# Patient Record
Sex: Female | Born: 1965 | Race: Black or African American | Hispanic: No | Marital: Married | Smoking: Never smoker
Health system: Southern US, Community
[De-identification: ages and names within clinical notes are randomized; demographics above are authoritative.]

## PROBLEM LIST (undated history)

## (undated) DIAGNOSIS — T782XXA Anaphylactic shock, unspecified, initial encounter: Secondary | ICD-10-CM

## (undated) DIAGNOSIS — E785 Hyperlipidemia, unspecified: Secondary | ICD-10-CM

## (undated) DIAGNOSIS — G609 Hereditary and idiopathic neuropathy, unspecified: Secondary | ICD-10-CM

## (undated) DIAGNOSIS — H409 Unspecified glaucoma: Secondary | ICD-10-CM

## (undated) DIAGNOSIS — G43909 Migraine, unspecified, not intractable, without status migrainosus: Secondary | ICD-10-CM

## (undated) DIAGNOSIS — J45909 Unspecified asthma, uncomplicated: Secondary | ICD-10-CM

## (undated) DIAGNOSIS — D649 Anemia, unspecified: Secondary | ICD-10-CM

## (undated) DIAGNOSIS — K219 Gastro-esophageal reflux disease without esophagitis: Secondary | ICD-10-CM

## (undated) DIAGNOSIS — R569 Unspecified convulsions: Secondary | ICD-10-CM

## (undated) DIAGNOSIS — L509 Urticaria, unspecified: Secondary | ICD-10-CM

## (undated) HISTORY — DX: Anaphylactic shock, unspecified, initial encounter: T78.2XXA

## (undated) HISTORY — DX: Gastro-esophageal reflux disease without esophagitis: K21.9

## (undated) HISTORY — DX: Anemia, unspecified: D64.9

## (undated) HISTORY — DX: Hereditary and idiopathic neuropathy, unspecified: G60.9

## (undated) HISTORY — DX: Migraine, unspecified, not intractable, without status migrainosus: G43.909

## (undated) HISTORY — DX: Hyperlipidemia, unspecified: E78.5

## (undated) HISTORY — DX: Unspecified glaucoma: H40.9

## (undated) HISTORY — DX: Urticaria, unspecified: L50.9

---

## 1986-05-30 DIAGNOSIS — Z5189 Encounter for other specified aftercare: Secondary | ICD-10-CM

## 1986-05-30 HISTORY — DX: Encounter for other specified aftercare: Z51.89

## 1990-05-30 HISTORY — PX: APPENDECTOMY: SHX54

## 1994-05-30 HISTORY — PX: ABDOMINAL HYSTERECTOMY: SHX81

## 1995-05-31 HISTORY — PX: CYST REMOVAL HAND: SHX6279

## 1997-12-19 ENCOUNTER — Inpatient Hospital Stay (HOSPITAL_COMMUNITY): Admission: EM | Admit: 1997-12-19 | Discharge: 1997-12-20 | Payer: Self-pay | Admitting: Obstetrics & Gynecology

## 1998-07-20 ENCOUNTER — Emergency Department (HOSPITAL_COMMUNITY): Admission: EM | Admit: 1998-07-20 | Discharge: 1998-07-20 | Payer: Self-pay | Admitting: Emergency Medicine

## 1999-01-26 ENCOUNTER — Encounter: Payer: Self-pay | Admitting: Gastroenterology

## 1999-01-26 ENCOUNTER — Ambulatory Visit (HOSPITAL_COMMUNITY): Admission: RE | Admit: 1999-01-26 | Discharge: 1999-01-26 | Payer: Self-pay | Admitting: Gastroenterology

## 1999-02-25 ENCOUNTER — Encounter: Payer: Self-pay | Admitting: Gastroenterology

## 1999-02-25 ENCOUNTER — Ambulatory Visit (HOSPITAL_COMMUNITY): Admission: RE | Admit: 1999-02-25 | Discharge: 1999-02-25 | Payer: Self-pay | Admitting: Gastroenterology

## 2000-06-20 ENCOUNTER — Emergency Department (HOSPITAL_COMMUNITY): Admission: EM | Admit: 2000-06-20 | Discharge: 2000-06-21 | Payer: Self-pay | Admitting: Emergency Medicine

## 2004-08-16 ENCOUNTER — Ambulatory Visit: Payer: Self-pay | Admitting: Internal Medicine

## 2004-08-18 ENCOUNTER — Ambulatory Visit: Payer: Self-pay | Admitting: Internal Medicine

## 2006-07-27 ENCOUNTER — Emergency Department (HOSPITAL_COMMUNITY): Admission: EM | Admit: 2006-07-27 | Discharge: 2006-07-27 | Payer: Self-pay | Admitting: Emergency Medicine

## 2008-08-21 ENCOUNTER — Ambulatory Visit: Payer: Self-pay | Admitting: Internal Medicine

## 2008-08-21 LAB — CONVERTED CEMR LAB
ALT: 16 units/L (ref 0–35)
AST: 17 units/L (ref 0–37)
Albumin: 3.9 g/dL (ref 3.5–5.2)
Alkaline Phosphatase: 76 units/L (ref 39–117)
BUN: 9 mg/dL (ref 6–23)
Basophils Absolute: 0 10*3/uL (ref 0.0–0.1)
Basophils Relative: 0.5 % (ref 0.0–3.0)
Bilirubin Urine: NEGATIVE
Bilirubin, Direct: 0.1 mg/dL (ref 0.0–0.3)
CO2: 28 meq/L (ref 19–32)
Calcium: 9.3 mg/dL (ref 8.4–10.5)
Chloride: 106 meq/L (ref 96–112)
Cholesterol: 172 mg/dL (ref 0–200)
Creatinine, Ser: 0.8 mg/dL (ref 0.4–1.2)
Eosinophils Absolute: 0.1 10*3/uL (ref 0.0–0.7)
Eosinophils Relative: 2.7 % (ref 0.0–5.0)
GFR calc non Af Amer: 101.28 mL/min (ref >60)
Glucose, Bld: 114 mg/dL — ABNORMAL HIGH (ref 70–99)
HCT: 39.4 % (ref 36.0–46.0)
HDL: 28.4 mg/dL — ABNORMAL LOW (ref >39.00)
Hemoglobin: 13.7 g/dL (ref 12.0–15.0)
Hgb A1c MFr Bld: 6 % (ref 4.6–6.5)
Ketones, ur: NEGATIVE mg/dL
LDL Cholesterol: 122 mg/dL — ABNORMAL HIGH (ref 0–99)
Lymphocytes Relative: 43.8 % (ref 12.0–46.0)
Lymphs Abs: 2.1 10*3/uL (ref 0.7–4.0)
MCHC: 34.8 g/dL (ref 30.0–36.0)
MCV: 90.1 fL (ref 78.0–100.0)
Monocytes Absolute: 0.3 10*3/uL (ref 0.1–1.0)
Monocytes Relative: 6.8 % (ref 3.0–12.0)
Neutro Abs: 2.2 10*3/uL (ref 1.4–7.7)
Neutrophils Relative %: 46.2 % (ref 43.0–77.0)
Nitrite: NEGATIVE
Platelets: 188 10*3/uL (ref 150.0–400.0)
Potassium: 3.6 meq/L (ref 3.5–5.1)
RBC: 4.37 M/uL (ref 3.87–5.11)
RDW: 12.3 % (ref 11.5–14.6)
Sodium: 141 meq/L (ref 135–145)
Specific Gravity, Urine: 1.02 (ref 1.000–1.030)
TSH: 0.96 microintl units/mL (ref 0.35–5.50)
Total Bilirubin: 0.6 mg/dL (ref 0.3–1.2)
Total CHOL/HDL Ratio: 6
Total Protein, Urine: NEGATIVE mg/dL
Total Protein: 7.4 g/dL (ref 6.0–8.3)
Triglycerides: 108 mg/dL (ref 0.0–149.0)
Urine Glucose: NEGATIVE mg/dL
Urobilinogen, UA: 0.2 (ref 0.0–1.0)
VLDL: 21.6 mg/dL (ref 0.0–40.0)
WBC: 4.7 10*3/uL (ref 4.5–10.5)
pH: 6 (ref 5.0–8.0)

## 2008-09-03 ENCOUNTER — Ambulatory Visit: Payer: Self-pay | Admitting: Internal Medicine

## 2008-09-03 DIAGNOSIS — D649 Anemia, unspecified: Secondary | ICD-10-CM

## 2008-09-03 DIAGNOSIS — R7611 Nonspecific reaction to tuberculin skin test without active tuberculosis: Secondary | ICD-10-CM | POA: Insufficient documentation

## 2008-09-03 DIAGNOSIS — K219 Gastro-esophageal reflux disease without esophagitis: Secondary | ICD-10-CM | POA: Insufficient documentation

## 2008-09-03 DIAGNOSIS — G43909 Migraine, unspecified, not intractable, without status migrainosus: Secondary | ICD-10-CM | POA: Insufficient documentation

## 2008-09-03 DIAGNOSIS — E785 Hyperlipidemia, unspecified: Secondary | ICD-10-CM | POA: Insufficient documentation

## 2008-12-29 ENCOUNTER — Encounter: Payer: Self-pay | Admitting: Internal Medicine

## 2009-01-15 ENCOUNTER — Encounter (INDEPENDENT_AMBULATORY_CARE_PROVIDER_SITE_OTHER): Payer: Self-pay | Admitting: *Deleted

## 2009-04-14 ENCOUNTER — Ambulatory Visit (HOSPITAL_COMMUNITY): Admission: RE | Admit: 2009-04-14 | Discharge: 2009-04-14 | Payer: Self-pay | Admitting: Internal Medicine

## 2009-04-22 DIAGNOSIS — R928 Other abnormal and inconclusive findings on diagnostic imaging of breast: Secondary | ICD-10-CM | POA: Insufficient documentation

## 2009-04-28 ENCOUNTER — Encounter: Payer: Self-pay | Admitting: Internal Medicine

## 2009-04-29 ENCOUNTER — Encounter: Admission: RE | Admit: 2009-04-29 | Discharge: 2009-04-29 | Payer: Self-pay | Admitting: Internal Medicine

## 2009-09-10 ENCOUNTER — Other Ambulatory Visit: Admission: RE | Admit: 2009-09-10 | Discharge: 2009-09-10 | Payer: Self-pay | Admitting: Internal Medicine

## 2009-09-10 ENCOUNTER — Ambulatory Visit: Payer: Self-pay | Admitting: Internal Medicine

## 2009-09-10 LAB — HM PAP SMEAR: HM Pap smear: NORMAL

## 2009-09-11 LAB — CONVERTED CEMR LAB
ALT: 16 units/L (ref 0–35)
AST: 20 units/L (ref 0–37)
Albumin: 3.9 g/dL (ref 3.5–5.2)
Alkaline Phosphatase: 78 units/L (ref 39–117)
BUN: 8 mg/dL (ref 6–23)
Basophils Absolute: 0 10*3/uL (ref 0.0–0.1)
Basophils Relative: 0.9 % (ref 0.0–3.0)
Bilirubin Urine: NEGATIVE
Bilirubin, Direct: 0 mg/dL (ref 0.0–0.3)
CO2: 28 meq/L (ref 19–32)
Calcium: 9.5 mg/dL (ref 8.4–10.5)
Chloride: 106 meq/L (ref 96–112)
Cholesterol: 185 mg/dL (ref 0–200)
Creatinine, Ser: 0.7 mg/dL (ref 0.4–1.2)
Eosinophils Absolute: 0.2 10*3/uL (ref 0.0–0.7)
Eosinophils Relative: 3.9 % (ref 0.0–5.0)
GFR calc non Af Amer: 117.56 mL/min (ref >60)
Glucose, Bld: 103 mg/dL — ABNORMAL HIGH (ref 70–99)
HCT: 40.3 % (ref 36.0–46.0)
HDL: 36.4 mg/dL — ABNORMAL LOW (ref >39.00)
Hemoglobin: 14.1 g/dL (ref 12.0–15.0)
Ketones, ur: NEGATIVE mg/dL
LDL Cholesterol: 128 mg/dL — ABNORMAL HIGH (ref 0–99)
Lymphocytes Relative: 42.8 % (ref 12.0–46.0)
Lymphs Abs: 1.8 10*3/uL (ref 0.7–4.0)
MCHC: 35 g/dL (ref 30.0–36.0)
MCV: 88.4 fL (ref 78.0–100.0)
Monocytes Absolute: 0.3 10*3/uL (ref 0.1–1.0)
Monocytes Relative: 6.7 % (ref 3.0–12.0)
Neutro Abs: 1.9 10*3/uL (ref 1.4–7.7)
Neutrophils Relative %: 45.7 % (ref 43.0–77.0)
Nitrite: NEGATIVE
Platelets: 200 10*3/uL (ref 150.0–400.0)
Potassium: 3.8 meq/L (ref 3.5–5.1)
RBC: 4.56 M/uL (ref 3.87–5.11)
RDW: 13.1 % (ref 11.5–14.6)
Sodium: 141 meq/L (ref 135–145)
Specific Gravity, Urine: 1.025 (ref 1.000–1.030)
TSH: 0.94 microintl units/mL (ref 0.35–5.50)
Total Bilirubin: 0.4 mg/dL (ref 0.3–1.2)
Total CHOL/HDL Ratio: 5
Total Protein, Urine: NEGATIVE mg/dL
Total Protein: 8.1 g/dL (ref 6.0–8.3)
Triglycerides: 101 mg/dL (ref 0.0–149.0)
Urine Glucose: NEGATIVE mg/dL
Urobilinogen, UA: 0.2 (ref 0.0–1.0)
VLDL: 20.2 mg/dL (ref 0.0–40.0)
WBC: 4.2 10*3/uL — ABNORMAL LOW (ref 4.5–10.5)
pH: 6 (ref 5.0–8.0)

## 2009-09-14 LAB — CONVERTED CEMR LAB: Pap Smear: NEGATIVE

## 2010-01-14 ENCOUNTER — Ambulatory Visit: Payer: Self-pay | Admitting: Internal Medicine

## 2010-01-14 DIAGNOSIS — G609 Hereditary and idiopathic neuropathy, unspecified: Secondary | ICD-10-CM | POA: Insufficient documentation

## 2010-01-15 LAB — CONVERTED CEMR LAB
Folate: 6.3 ng/mL
Glucose, Bld: 105 mg/dL — ABNORMAL HIGH (ref 70–99)

## 2010-02-02 ENCOUNTER — Encounter: Payer: Self-pay | Admitting: Internal Medicine

## 2010-02-11 ENCOUNTER — Telehealth: Payer: Self-pay | Admitting: Internal Medicine

## 2010-05-30 DIAGNOSIS — T7840XA Allergy, unspecified, initial encounter: Secondary | ICD-10-CM

## 2010-05-30 HISTORY — DX: Allergy, unspecified, initial encounter: T78.40XA

## 2010-06-20 ENCOUNTER — Encounter: Payer: Self-pay | Admitting: Internal Medicine

## 2010-06-29 NOTE — Assessment & Plan Note (Signed)
Summary: FU---STC   Vital Signs:  Patient profile:   45 year old female Height:      5.6 inches (14.22 cm) Weight:      230.0 pounds (104.55 kg) BMI:     5175.13 O2 Sat:      96 % on Room air Temp:     99.4 degrees F (37.44 degrees C) oral Pulse rate:   74 / minute BP sitting:   118 / 84  (left arm) Cuff size:   large  Vitals Entered By: Orlan Leavens (September 10, 2009 8:51 AM)  O2 Flow:  Room air CC: follow-up visit Is Patient Diabetic? No Pain Assessment Patient in pain? no        Primary Care Provider:  Newt Lukes MD  CC:  follow-up visit.  History of Present Illness: patient is here today for annual physical. Patient feels well-  occ reflux and stomach bloat - uses otc gas x with incomplete relief of symptoms and indigestion -   Preventive Screening-Counseling & Management  Alcohol-Tobacco     Alcohol drinks/day: 0     Smoking Status: never  Caffeine-Diet-Exercise     Does Patient Exercise: yes     Exercise Counseling: to improve exercise regimen     Depression Counseling: not indicated; screening negative for depression  Safety-Violence-Falls     Seat Belt Use: yes     Helmet Use: no     Firearms in the Home: no firearms in the home     Smoke Detectors: yes     Violence in the Home: no risk noted     Fall Risk Counseling: not indicated; no significant falls noted  Clinical Review Panels:  Prevention   Last Mammogram:  BI-RADS CATEGORY 2:  Benign finding(s).^MM DIGITAL DIAG LTD L (04/29/2009)  Immunizations   Last Tetanus Booster:  Historical (05/30/2006)  Lipid Management   Cholesterol:  172 (08/21/2008)   LDL (bad choesterol):  122 (08/21/2008)   HDL (good cholesterol):  28.40 (08/21/2008)  Diabetes Management   HgBA1C:  6.0 (08/21/2008)   Creatinine:  0.8 (08/21/2008)  CBC   WBC:  4.7 (08/21/2008)   RBC:  4.37 (08/21/2008)   Hgb:  13.7 (08/21/2008)   Hct:  39.4 (08/21/2008)   Platelets:  188.0 (08/21/2008)   MCV  90.1  (08/21/2008)   MCHC  34.8 (08/21/2008)   RDW  12.3 (08/21/2008)   PMN:  46.2 (08/21/2008)   Lymphs:  43.8 (08/21/2008)   Monos:  6.8 (08/21/2008)   Eosinophils:  2.7 (08/21/2008)   Basophil:  0.5 (08/21/2008)  Complete Metabolic Panel   Glucose:  114 (08/21/2008)   Sodium:  141 (08/21/2008)   Potassium:  3.6 (08/21/2008)   Chloride:  106 (08/21/2008)   CO2:  28 (08/21/2008)   BUN:  9 (08/21/2008)   Creatinine:  0.8 (08/21/2008)   Albumin:  3.9 (08/21/2008)   Total Protein:  7.4 (08/21/2008)   Calcium:  9.3 (08/21/2008)   Total Bili:  0.6 (08/21/2008)   Alk Phos:  76 (08/21/2008)   SGPT (ALT):  16 (08/21/2008)   SGOT (AST):  17 (08/21/2008)   Current Medications (verified): 1)  None  Allergies (verified): 1)  ! Morphine  Past History:  Past Medical History: GERD (ICD-530.81) ANEMIA-NOS (ICD-285.9) POSITIVE PPD (ICD-795.5) MIGRAINE HEADACHE (ICD-346.90)  MD rooster: none  Past Surgical History: Reviewed history from 09/03/2008 and no changes required. Appendectomy (1914) Hysterectomy (1996) Caesarean section (1989)  Family History: Reviewed history from 09/03/2008 and no changes required. 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 I have reviewed all other systems and they were negative.   Physical Exam  General:  alert, well-developed, well-nourished, and cooperative to examination.    Eyes:   vision grossly intact; pupils equal, round and reactive to light.  conjunctiva and lids normal.    Ears:  normal pinnae bilaterally, without erythema, swelling, or tenderness to palpation. TMs clear, without effusion, or cerumen impaction. Hearing grossly normal bilaterally  Mouth:  teeth and gums in good repair; mucous membranes moist, without lesions or ulcers. oropharynx clear without exudate, no erythema.  Lungs:  normal respiratory effort, no intercostal retractions or use of accessory muscles; normal breath sounds bilaterally - no crackles and no wheezes.    Heart:  normal rate, regular rhythm, no murmur, and no rub. BLE without edema. normal DP pulses and normal cap refill in all 4 extremities    Abdomen:  soft, non-tender, normal bowel sounds, no distention; no masses and no appreciable hepatomegaly or splenomegaly.   Genitalia:  Pelvic Exam:        External: normal female genitalia without lesions or masses        Vagina: normal without lesions or masses        Adnexa: normal bimanual exam without masses or fullness        Pap smear: performed Msk:  No deformity or scoliosis noted of thoracic or lumbar spine.   Neurologic:  alert & oriented X3 and cranial nerves II-XII symetrically intact.  strength normal in all extremities, sensation intact to light touch, and gait normal. speech fluent without dysarthria or aphasia; follows commands with good comprehension.  Skin:  no rashes, vesicles, ulcers, or erythema. No nodules or irregularity to palpation.  Psych:  Oriented X3, memory intact for recent and remote, normally interactive, good eye contact, not anxious appearing, not depressed appearing, and not agitated.      Impression & Recommendations:  Problem # 1:  PREVENTIVE HEALTH CARE (ICD-V70.0)  Patient has been counseled on age-appropriate routine health concerns for screening and prevention. These are reviewed and up-to-date. Immunizations are up-to-date or declined. PAP/pelvic done,  labs ordered and will be reviewed; and ECG reviewed.   Orders: TLB-Lipid Panel (80061-LIPID) TLB-BMP (Basic Metabolic Panel-BMET) (80048-METABOL) TLB-CBC Platelet - w/Differential (85025-CBCD) TLB-Hepatic/Liver Function Pnl (80076-HEPATIC) TLB-TSH (Thyroid Stimulating Hormone) (84443-TSH) TLB-Udip ONLY (81003-UDIP) EKG w/ Interpretation (93000) Pap Smear, Thin Prep ( Collection of) (Z6109)  Problem # 2:  GERD (ICD-530.81)  Her updated medication list for this problem includes:    Prilosec Otc 20 Mg Tbec (Omeprazole magnesium) .Marland Kitchen... 1 by mouth once daily x 2 week, then as needed for indigestion  Labs Reviewed: Hgb: 13.7 (08/21/2008)   Hct: 39.4 (08/21/2008)  Complete Medication List: 1)  Prilosec Otc 20 Mg Tbec (Omeprazole magnesium) .Marland Kitchen.. 1 by mouth once daily x 2 week, then as needed for indigestion 2)  Gas-x 80 Mg Chew (Simethicone) .Marland Kitchen.. 1-2  by mouth every 8hours as needed for gas and bloating  Patient Instructions: 1)  it was good to see you today. 2)  exam and EKG (heart) lok good today -  3)  test(s) ordered today - your results will be posted on the phone tree for review in 48-72 hours from the time of test completion; call [phone] and enter your 9 digit MRN (listed above on this page, just below your name); if any changes need to be made or there are abnormal results, you will be contacted directly.  4)  Prilosec OTC as discussed + Gas X as needed - call if symptoms unimproved with this therapy - 5)  it is important that you work on losing weight - monitor your diet and consume fewer calories such as less carbohydrates (sugar) and less fat. you also need to increase your physical activity level - start by walking for 10-20 minutes 3 times per week and work up to 30 minutes 4-5 times each week.  6)  Take calcium +Vitamin D daily. goal= 1200mg  calcium and 1000u vit d daily 7)  Please schedule a follow-up appointment annually for medical physical, sooner if problems.  Will not  need to repeat PAP if this is normal this year!

## 2010-06-29 NOTE — Assessment & Plan Note (Signed)
Summary: numbness hands,feet/cd   Vital Signs:  Patient profile:   45 year old female Height:      66 inches (14.22 cm) Weight:      234.50 pounds (106.59 kg) BMI:     37.99 O2 Sat:      98 % on Room air Temp:     98.5 degrees F (36.94 degrees C) oral Pulse rate:   89 / minute BP sitting:   124 / 86  (left arm) Cuff size:   large  Vitals Entered By: Brenton Grills MA (January 14, 2010 9:00 AM)  O2 Flow:  Room air CC: numbness in fingertips and toes/hand swelling/pt is not taking any medications/aj   Primary Care Provider:  Newt Lukes MD  CC:  numbness in fingertips and toes/hand swelling/pt is not taking any medications/aj.  History of Present Illness: c/o hand numbness onset 2 weeks ago - affects right>left hand - sometimes "numb" sometimes "pins and needles pain" symptoms are intermittenet - symptoms worse awaking from sleep or with activity no injury to head, neck, shoulders, arms or hands - no falls or balance problems   Clinical Review Panels:  CBC   WBC:  4.2 (09/10/2009)   RBC:  4.56 (09/10/2009)   Hgb:  14.1 (09/10/2009)   Hct:  40.3 (09/10/2009)   Platelets:  200.0 (09/10/2009)   MCV  88.4 (09/10/2009)   MCHC  35.0 (09/10/2009)   RDW  13.1 (09/10/2009)   PMN:  45.7 (09/10/2009)   Lymphs:  42.8 (09/10/2009)   Monos:  6.7 (09/10/2009)   Eosinophils:  3.9 (09/10/2009)   Basophil:  0.9 (09/10/2009)  Complete Metabolic Panel   Glucose:  103 (09/10/2009)   Sodium:  141 (09/10/2009)   Potassium:  3.8 (09/10/2009)   Chloride:  106 (09/10/2009)   CO2:  28 (09/10/2009)   BUN:  8 (09/10/2009)   Creatinine:  0.7 (09/10/2009)   Albumin:  3.9 (09/10/2009)   Total Protein:  8.1 (09/10/2009)   Calcium:  9.5 (09/10/2009)   Total Bili:  0.4 (09/10/2009)   Alk Phos:  78 (09/10/2009)   SGPT (ALT):  16 (09/10/2009)   SGOT (AST):  20 (09/10/2009)   Current Medications (verified): 1)  Prilosec Otc 20 Mg Tbec (Omeprazole Magnesium) .Marland Kitchen.. 1 By Mouth Once  Daily X 2 Week, Then As Needed For Indigestion 2)  Gas-X 80 Mg Chew (Simethicone) .Marland Kitchen.. 1-2 By Mouth Every 8hours As Needed For Gas and Bloating  Allergies (verified): 1)  ! Morphine  Past History:  Past Medical History: GERD (ICD-530.81) ANEMIA-NOS (ICD-285.9) POSITIVE PPD (ICD-795.5) MIGRAINE HEADACHE (ICD-346.90)  MD roster: none  Review of Systems  The patient denies weight loss, abdominal pain, difficulty walking, depression, and unusual weight change.    Physical Exam  General:  alert, well-developed, well-nourished, and cooperative to examination.    Lungs:  normal respiratory effort, no intercostal retractions or use of accessory muscles; normal breath sounds bilaterally - no crackles and no wheezes.    Heart:  normal rate, regular rhythm, no murmur, and no rub. BLE without edema. normal DP pulses and normal cap refill in all 4 extremities    Neurologic:  alert & oriented X3 and cranial nerves II-XII symetrically intact.  strength grosslu normal in all extremities but decreased hand grip R>L; sensation intact to light touch, and gait + proprioception normal. speech fluent without dysarthria or aphasia; follows commands with good comprehension.    Impression & Recommendations:  Problem # 1:  PERIPHERAL NEUROPATHY (ICD-356.9)  pt symptoms  most c/w carpal tunnel by hx -  r/o metobolic or nutritional causes with labs now-  trial wrist spint + B6 - refer for PNCS to confirm dx/etiology -  Orders: Ankle / Wrist Splint (A4570) Misc. Referral (Misc. Ref) TLB-B12 + Folate Pnl (82746_82607-B12/FOL) TLB-TSH (Thyroid Stimulating Hormone) (84443-TSH) TLB-Glucose, QUANT (82947-GLU)  Complete Medication List: 1)  Prilosec Otc 20 Mg Tbec (Omeprazole magnesium) .Marland Kitchen.. 1 by mouth once daily x 2 week, then as needed for indigestion 2)  Gas-x 80 Mg Chew (Simethicone) .Marland Kitchen.. 1-2 by mouth every 8hours as needed for gas and bloating 3)  Vitamin B-6 100 Mg Tabs (Pyridoxine hcl) .Marland Kitchen.. 1 by  mouth once daily  Patient Instructions: 1)  it was good to see you today. 2)  we'll make referral for nerve studys to look for carpal tunnel or other nerve problems causing the numbness. Our office will contact you regarding this appointment once made.  3)  also test(s) ordered today - your results will be posted on the phone tree for review in 48-72 hours from the time of test completion; call [phone] and enter your 9 digit MRN (listed above on this page, just below your name); if any changes need to be made or there are abnormal results, you will be contacted directly.  4)  use wrist splint as discussed, esp at bedtime  5)  also take vit B6 100mg  once daily to help with the numbness 6)  Please schedule a follow-up appointment as needed.

## 2010-06-29 NOTE — Progress Notes (Signed)
Summary: PNCS results - mild B carpal tunnel syndrome  Phone Note Outgoing Call   Call placed to: Patient Reason for Call: Discuss lab or test results Summary of Call: please call pt - let her know the nerve conduction study test done at [facility] 02/02/10 shows very mild carpal tunnel syndrome - this may be contributing to her hand pain/numbness - she should contine tx as outlined at our OV (B6 + wrist splint at night) and make ROV if continued pain or symptoms - thanks Newt Lukes MD  February 11, 2010 8:53 AM   Follow-up for Phone Call        Notified pt with results and md recommendations Follow-up by: Orlan Leavens RMA,  February 11, 2010 12:01 PM

## 2010-07-09 ENCOUNTER — Emergency Department (HOSPITAL_COMMUNITY)
Admission: EM | Admit: 2010-07-09 | Discharge: 2010-07-09 | Disposition: A | Discharge status: Home / Self Care | Payer: Managed Care, Other (non HMO) | Attending: Emergency Medicine | Admitting: Emergency Medicine

## 2010-07-09 ENCOUNTER — Emergency Department (HOSPITAL_COMMUNITY): Payer: Managed Care, Other (non HMO)

## 2010-07-09 DIAGNOSIS — R1011 Right upper quadrant pain: Secondary | ICD-10-CM | POA: Insufficient documentation

## 2010-07-09 DIAGNOSIS — K2970 Gastritis, unspecified, without bleeding: Secondary | ICD-10-CM | POA: Insufficient documentation

## 2010-07-09 DIAGNOSIS — R0789 Other chest pain: Secondary | ICD-10-CM | POA: Insufficient documentation

## 2010-07-09 DIAGNOSIS — K2990 Gastroduodenitis, unspecified, without bleeding: Secondary | ICD-10-CM | POA: Insufficient documentation

## 2010-07-09 DIAGNOSIS — R110 Nausea: Secondary | ICD-10-CM | POA: Insufficient documentation

## 2010-07-09 LAB — COMPREHENSIVE METABOLIC PANEL
ALT: 16 U/L (ref 0–35)
AST: 19 U/L (ref 0–37)
Alkaline Phosphatase: 82 U/L (ref 39–117)
CO2: 27 mEq/L (ref 19–32)
Chloride: 106 mEq/L (ref 96–112)
Creatinine, Ser: 0.79 mg/dL (ref 0.4–1.2)
GFR calc Af Amer: >60 mL/min (ref >60)
GFR calc non Af Amer: >60 mL/min (ref >60)
Potassium: 3.5 mEq/L (ref 3.5–5.1)
Total Bilirubin: 0.3 mg/dL (ref 0.3–1.2)

## 2010-07-09 LAB — DIFFERENTIAL
Basophils Relative: 1 % (ref 0–1)
Lymphs Abs: 2 10*3/uL (ref 0.7–4.0)
Monocytes Relative: 7 % (ref 3–12)
Neutro Abs: 2.8 10*3/uL (ref 1.7–7.7)
Neutrophils Relative %: 52 % (ref 43–77)

## 2010-07-09 LAB — CBC
Hemoglobin: 14.3 g/dL (ref 12.0–15.0)
MCH: 30.4 pg (ref 26.0–34.0)
RBC: 4.71 MIL/uL (ref 3.87–5.11)
WBC: 5.4 10*3/uL (ref 4.0–10.5)

## 2010-12-15 ENCOUNTER — Encounter: Payer: Self-pay | Admitting: Internal Medicine

## 2010-12-15 ENCOUNTER — Other Ambulatory Visit (INDEPENDENT_AMBULATORY_CARE_PROVIDER_SITE_OTHER): Payer: Managed Care, Other (non HMO) | Admitting: Internal Medicine

## 2010-12-15 ENCOUNTER — Other Ambulatory Visit (INDEPENDENT_AMBULATORY_CARE_PROVIDER_SITE_OTHER): Payer: Managed Care, Other (non HMO)

## 2010-12-15 ENCOUNTER — Ambulatory Visit (INDEPENDENT_AMBULATORY_CARE_PROVIDER_SITE_OTHER): Payer: Managed Care, Other (non HMO) | Admitting: Internal Medicine

## 2010-12-15 DIAGNOSIS — R110 Nausea: Secondary | ICD-10-CM

## 2010-12-15 DIAGNOSIS — R509 Fever, unspecified: Secondary | ICD-10-CM

## 2010-12-15 DIAGNOSIS — Z0000 Encounter for general adult medical examination without abnormal findings: Secondary | ICD-10-CM

## 2010-12-15 DIAGNOSIS — R05 Cough: Secondary | ICD-10-CM

## 2010-12-15 LAB — CBC WITH DIFFERENTIAL/PLATELET
Basophils Absolute: 0 10*3/uL (ref 0.0–0.1)
Basophils Relative: 0.5 % (ref 0.0–3.0)
Hemoglobin: 14.8 g/dL (ref 12.0–15.0)
Lymphocytes Relative: 19.2 % (ref 12.0–46.0)
Monocytes Relative: 10.7 % (ref 3.0–12.0)
Neutro Abs: 4.5 10*3/uL (ref 1.4–7.7)
Neutrophils Relative %: 68.5 % (ref 43.0–77.0)
RBC: 4.79 Mil/uL (ref 3.87–5.11)
WBC: 6.6 10*3/uL (ref 4.5–10.5)

## 2010-12-15 LAB — HEPATIC FUNCTION PANEL
ALT: 18 U/L (ref 0–35)
AST: 22 U/L (ref 0–37)
Albumin: 4.5 g/dL (ref 3.5–5.2)
Alkaline Phosphatase: 71 U/L (ref 39–117)
Total Protein: 8.9 g/dL — ABNORMAL HIGH (ref 6.0–8.3)

## 2010-12-15 LAB — URINALYSIS, ROUTINE W REFLEX MICROSCOPIC
Specific Gravity, Urine: 1.025 (ref 1.000–1.030)
Urine Glucose: NEGATIVE
Urobilinogen, UA: 0.2 (ref 0.0–1.0)
pH: 6 (ref 5.0–8.0)

## 2010-12-15 LAB — BASIC METABOLIC PANEL
Calcium: 9.2 mg/dL (ref 8.4–10.5)
Creatinine, Ser: 0.9 mg/dL (ref 0.4–1.2)
GFR: 88.58 mL/min (ref >60.00)
Sodium: 138 mEq/L (ref 135–145)

## 2010-12-15 MED ORDER — PROMETHAZINE HCL 25 MG/ML IJ SOLN
25.0000 mg | Freq: Four times a day (QID) | INTRAMUSCULAR | Status: DC | PRN
  Administered 2010-12-15: 25 mg via INTRAMUSCULAR

## 2010-12-15 MED ORDER — HYDROCODONE-HOMATROPINE 5-1.5 MG/5ML PO SYRP: 5.0000 mL | ORAL_SOLUTION | Freq: Four times a day (QID) | ORAL | Status: DC | PRN

## 2010-12-15 MED ORDER — PROMETHAZINE HCL 25 MG PO TABS: 25.0000 mg | ORAL_TABLET | Freq: Four times a day (QID) | ORAL | Status: DC | PRN

## 2010-12-15 MED ORDER — LEVOFLOXACIN 500 MG PO TABS: 500.0000 mg | ORAL_TABLET | Freq: Every day | ORAL | Status: DC

## 2010-12-15 NOTE — Progress Notes (Signed)
  Subjective:     Megan Wilkerson is a 45 y.o. female here for evaluation of a cough. Onset of symptoms was 3 days ago. Symptoms have been gradually worsening since that time. The cough is dry, nocturnal and painful and is aggravated by infection. Associated symptoms include: chest pain, chills, fever, heartburn, night sweats and nausea. Patient does not have a history of asthma. Patient does not have a history of environmental allergens. Patient has not traveled recently. Patient does not have a history of smoking. Patient has had a previous chest x-ray. Patient has had a PPD done. (positive)  The following portions of the patient's history were reviewed and updated as appropriate: allergies, current medications, past family history, past medical history, past social history, past surgical history and problem list.  Review of Systems Constitutional: negative for weight loss Respiratory: negative for hemoptysis Cardiovascular: negative for irregular heart beat and syncope Gastrointestinal: negative for diarrhea and vomiting Positive for nausea.   Objective:    Oxygen saturation 95% on room air BP 110/84  Pulse 103  Temp(Src) 102.6 F (39.2 C) (Oral)  Ht 5\' 6"  (1.676 m)  SpO2 95% General appearance: alert, cooperative, moderate distress and coughing Eyes: negative findings: lids and lashes normal, conjunctivae and sclerae normal and PERRL Ears: normal TM's and external ear canals both ears Throat: abnormal findings: moderate oropharyngeal erythema Neck: no adenopathy, no carotid bruit, no JVD, supple, symmetrical, trachea midline and thyroid not enlarged, symmetric, no tenderness/mass/nodules Lungs: rhonchi bibasilar Heart: regular rate and rhythm, S1, S2 normal, no murmur, click, rub or gallop    Assessment:    Acute Bronchitis and fever, nausea    Plan:    Antibiotics per medication orders. Antitussives per medication orders. Avoid exposure to tobacco smoke and fumes. Call if  shortness of breath worsens, blood in sputum, change in character of cough, development of fever or chills, inability to maintain nutrition and hydration. Avoid exposure to tobacco smoke and fumes. Chest x-ray.

## 2010-12-15 NOTE — Patient Instructions (Signed)
It was good to see you today. Test(s) ordered today - labs and chest xray - Your results will be called to you after review (48-72hours after test completion). If any changes need to be made, you will be notified at that time. Phenergan shot given in office today for nausea Start levaquin antibiotics, hydromet cough syrup and phenergan for nausea symptoms - Your prescription(s) have been submitted to your pharmacy. Please take as directed and contact our office if you believe you are having problem(s) with the medication(s). if your symptoms continue to worsen (pain, fever, etc), or if you are unable take anything by mouth (pills, fluids, etc), you should go to the emergency room for further evaluation and treatment.

## 2010-12-16 ENCOUNTER — Ambulatory Visit (INDEPENDENT_AMBULATORY_CARE_PROVIDER_SITE_OTHER)
Admission: RE | Admit: 2010-12-16 | Discharge: 2010-12-16 | Disposition: A | Discharge status: Home / Self Care | Payer: Managed Care, Other (non HMO) | Source: Ambulatory Visit | Attending: Internal Medicine | Admitting: Internal Medicine

## 2010-12-16 ENCOUNTER — Telehealth: Payer: Self-pay | Admitting: *Deleted

## 2010-12-16 DIAGNOSIS — R509 Fever, unspecified: Secondary | ICD-10-CM

## 2010-12-16 DIAGNOSIS — R05 Cough: Secondary | ICD-10-CM

## 2010-12-16 NOTE — Telephone Encounter (Signed)
Call report from Radiology-Right lobe pneumonia.

## 2010-12-16 NOTE — Telephone Encounter (Signed)
Noted (see result note on CXR report) - pt on appropriate tx from OV yesterday (levaquin) -

## 2010-12-17 NOTE — Telephone Encounter (Signed)
Pt was notified of results.Marland KitchenMarland Kitchen7/20/12@9 :38am/LMB

## 2010-12-22 ENCOUNTER — Ambulatory Visit (INDEPENDENT_AMBULATORY_CARE_PROVIDER_SITE_OTHER)
Admission: RE | Admit: 2010-12-22 | Discharge: 2010-12-22 | Disposition: A | Discharge status: Home / Self Care | Payer: Managed Care, Other (non HMO) | Source: Ambulatory Visit | Attending: Internal Medicine | Admitting: Internal Medicine

## 2010-12-22 ENCOUNTER — Ambulatory Visit (INDEPENDENT_AMBULATORY_CARE_PROVIDER_SITE_OTHER): Payer: Managed Care, Other (non HMO) | Admitting: Internal Medicine

## 2010-12-22 ENCOUNTER — Encounter: Payer: Self-pay | Admitting: Internal Medicine

## 2010-12-22 VITALS — BP 100/72 | HR 86 | Temp 98.4°F | Ht 66.0 in

## 2010-12-22 DIAGNOSIS — J189 Pneumonia, unspecified organism: Secondary | ICD-10-CM

## 2010-12-22 DIAGNOSIS — R05 Cough: Secondary | ICD-10-CM

## 2010-12-22 MED ORDER — CLARITHROMYCIN 500 MG PO TABS: 500.0000 mg | ORAL_TABLET | Freq: Two times a day (BID) | ORAL | Status: AC

## 2010-12-22 MED ORDER — CHLORPHENIRAMINE-HYDROCODONE 8-10 MG/5ML PO LQCR: 5.0000 mL | Freq: Two times a day (BID) | ORAL | Status: DC | PRN

## 2010-12-22 MED ORDER — BENZONATATE 100 MG PO CAPS: 100.0000 mg | ORAL_CAPSULE | Freq: Three times a day (TID) | ORAL | Status: DC | PRN

## 2010-12-22 NOTE — Patient Instructions (Addendum)
It was good to see you today. Test(s) ordered today - repeat chest xray - Your results will be called to you after review (48-72hours after test completion). If any changes need to be made, you will be notified at that time. Start biaxin (different antibiotics), tussionex cough syrup and tessalon.  Your prescription(s) have been submitted to your pharmacy. Please take as directed and contact our office if you believe you are having problem(s) with the medication(s). if your symptoms continue to worsen (pain, fever, etc), or if you are unable take anything by mouth (pills, fluids, etc), you should go to the emergency room for further evaluation and treatment.

## 2010-12-22 NOTE — Progress Notes (Signed)
Subjective:    Patient ID: Megan Wilkerson, female    DOB: 11/01/1965, 45 y.o.   MRN: [id number]  HPI  Seen here last week for cough, nausea and cough - CXR showed RUL PNA tx with 7d levaquin and hycodan syrup -  Continued shortness of breath and cough, nonproducitve - nausea resolved No night sweats or f/c  Past Medical History  Diagnosis Date  . ANEMIA-NOS   . GERD   . HYPERLIPIDEMIA, WITH LOW HDL   . POSITIVE PPD 1987    s/p 31mo tx  . MIGRAINE HEADACHE   . PERIPHERAL NEUROPATHY     Review of Systems  Respiratory: Negative for wheezing.   Cardiovascular: Negative for leg swelling.  Neurological: Negative for headaches.       Objective:   Physical Exam BP 100/72  Pulse 86  Temp(Src) 98.4 F (36.9 C) (Oral)  Ht 5\' 6"  (1.676 m)  SpO2 98% Constitutional: She is oriented to person, place, and time. She appears well-developed. Hoarse, midly ill appearing (better than last week) HENT: Head: Normocephalic and atraumatic. Ears; B TMs ok, no erythema or effusion; Nose: Nose normal.  Mouth/Throat: Oropharynx is clear and moist, mod erythema. No oropharyngeal exudate.  Eyes: Conjunctivae and EOM are normal. Pupils are equal, round, and reactive to light. No scleral icterus.  Neck: Normal range of motion. Neck supple. No JVD present. No thyromegaly present.  Cardiovascular: Normal rate, regular rhythm and normal heart sounds.  No murmur heard. No BLE edema. Pulmonary/Chest: Effort normal and breath sounds with few rhonchi. No respiratory distress. She has no wheezes.  Neurological: She is alert and oriented to person, place, and time. No cranial nerve deficit. Coordination normal.  Skin: Skin is warm and dry. No rash noted. No erythema.  Psychiatric: She has a normal mood and affect. Her behavior is normal. Judgment and thought content normal.      Lab Results  Component Value Date   WBC 6.6 12/15/2010   HGB 14.8 12/15/2010   HCT 43.0 12/15/2010   PLT 177.0 12/15/2010   CHOL  185 09/10/2009   TRIG 101.0 09/10/2009   HDL 36.40* 09/10/2009   ALT 18 12/15/2010   AST 22 12/15/2010   NA 138 12/15/2010   K 3.9 12/15/2010   CL 102 12/15/2010   CREATININE 0.9 12/15/2010   BUN 10 12/15/2010   CO2 27 12/15/2010   TSH 0.79 01/14/2010   HGBA1C 6.0 08/21/2008      Assessment & Plan:  Cough  RUL PNA - dx on CXR 12/16/10 - s/p Levaquin x 7d - fever down but continued cough and dyspnea on exertion - VSS/O2 ok Recheck CXR now and tx different abx (biaxin) to cover atypicals and tussionex + tessalon for cough S/p pertussis vac 2008 -  note hx pos PPD 1987 tx 31mo "pills" at that time - no CXR ever done before 12/16/10 per pt

## 2011-01-12 ENCOUNTER — Ambulatory Visit (INDEPENDENT_AMBULATORY_CARE_PROVIDER_SITE_OTHER): Payer: Managed Care, Other (non HMO) | Admitting: Internal Medicine

## 2011-01-12 ENCOUNTER — Encounter: Payer: Self-pay | Admitting: Internal Medicine

## 2011-01-12 VITALS — BP 122/82 | HR 88 | Temp 98.5°F | Ht 66.0 in

## 2011-01-12 DIAGNOSIS — J45909 Unspecified asthma, uncomplicated: Secondary | ICD-10-CM | POA: Insufficient documentation

## 2011-01-12 DIAGNOSIS — R059 Cough, unspecified: Secondary | ICD-10-CM

## 2011-01-12 DIAGNOSIS — R05 Cough: Secondary | ICD-10-CM

## 2011-01-12 DIAGNOSIS — J189 Pneumonia, unspecified organism: Secondary | ICD-10-CM

## 2011-01-12 MED ORDER — CHLORPHENIRAMINE-HYDROCODONE 8-10 MG/5ML PO LQCR: 5.0000 mL | Freq: Two times a day (BID) | ORAL | Status: DC | PRN

## 2011-01-12 MED ORDER — PREDNISONE (PAK) 10 MG PO TABS: 10.0000 mg | ORAL_TABLET | ORAL | Status: AC

## 2011-01-12 MED ORDER — LORATADINE 10 MG PO TABS: 10.0000 mg | ORAL_TABLET | Freq: Every day | ORAL | Status: DC | PRN

## 2011-01-12 MED ORDER — BENZONATATE 100 MG PO CAPS: 100.0000 mg | ORAL_CAPSULE | Freq: Three times a day (TID) | ORAL | Status: DC | PRN

## 2011-01-12 MED ORDER — ALBUTEROL 90 MCG/ACT IN AERS: 2.0000 | INHALATION_SPRAY | Freq: Four times a day (QID) | RESPIRATORY_TRACT | Status: DC | PRN

## 2011-01-12 NOTE — Assessment & Plan Note (Signed)
Continued dry cough symptoms with hoarseness - VS/O2 ok recent RUL PNA on CXR 12/16/10 - s/p Levaquin x 7d - fever down but continued cough and dyspnea on exertion -  follow up CXR 7/25 showed resolution but tx empiric antibiotics (biaxin) to cover atypicals and tussionex + tessalon for cough S/p pertussis vac 2008 - note hx pos PPD 1987 tx 55mo "pills" at that time - no CXR ever done before 12/16/10 per pt tx now with pred pak x 6d, albuterol mdi, PPI x 30d, claritin x 30d, tussionex qhs x 2week and tessalon x 30d - erx done if still unimporved, plan pulm eval Reviewed all with pt who understands and agrees

## 2011-01-12 NOTE — Progress Notes (Signed)
Subjective:    Patient ID: Megan Wilkerson, female    DOB: Nov 23, 1965, 45 y.o.   MRN: [id number]  HPI complains of worsening cough Onset 1 mo ago initial symptoms included cough, nausea and cough - 7/15 CXR showed RUL PNA tx with 7d levaquin and hycodan syrup -  Then 1 week follow up due to shortness of breath and cough, nonproducitve - nausea resolved - CXR showed PNA resolution No night sweats or f/c Cough improved with rx meds but returned since out of rx Dry and "deep", occasional wheeze No sputum, no travel, no sore throat but +hoarse and fatigued symptoms worst at night (supine) and with "outside air" exposure No hx asthma but + allergies  Past Medical History  Diagnosis Date  . ANEMIA-NOS   . GERD   . HYPERLIPIDEMIA, WITH LOW HDL   . POSITIVE PPD 1987    s/p 73mo tx  . MIGRAINE HEADACHE   . PERIPHERAL NEUROPATHY     Review of Systems  HENT: Positive for voice change. Negative for sinus pressure.   Cardiovascular: Negative for leg swelling.       Objective:   Physical Exam  BP 122/82  Pulse 88  Temp(Src) 98.5 F (36.9 C) (Oral)  Ht 5\' 6"  (1.676 m)  SpO2 97% Constitutional: She is oriented to person, place, and time. She appears well-developed. Hoarse, but nontoxic appearing  HENT: Head: Normocephalic and atraumatic. Ears; B TMs ok, no erythema or effusion; Nose: Nose normal.  Mouth/Throat: Oropharynx is clear and moist, mod erythema. No oropharyngeal exudate.  Eyes: Conjunctivae and EOM are normal. Pupils are equal, round, and reactive to light. No scleral icterus.  Neck: Normal range of motion. Neck supple. No JVD present. No thyromegaly present.  Cardiovascular: Normal rate, regular rhythm and normal heart sounds.  No murmur heard. No BLE edema. Pulmonary/Chest: Effort normal and breath sounds clear. No respiratory distress. She has no wheezes.  Skin: Skin is warm and dry. No rash noted. No erythema.  Psychiatric: She has a normal mood and affect. Her  behavior is normal. Judgment and thought content normal.      Lab Results  Component Value Date   WBC 6.6 12/15/2010   HGB 14.8 12/15/2010   HCT 43.0 12/15/2010   PLT 177.0 12/15/2010   CHOL 185 09/10/2009   TRIG 101.0 09/10/2009   HDL 36.40* 09/10/2009   ALT 18 12/15/2010   AST 22 12/15/2010   NA 138 12/15/2010   K 3.9 12/15/2010   CL 102 12/15/2010   CREATININE 0.9 12/15/2010   BUN 10 12/15/2010   CO2 27 12/15/2010   TSH 0.79 01/14/2010   HGBA1C 6.0 08/21/2008      Assessment & Plan:  See problem list. Medications, CXRs and labs reviewed today.

## 2011-01-12 NOTE — Patient Instructions (Signed)
It was good to see you today. If you develop worsening symptoms or fever, call and we can reconsider antibiotics, but it does not appear necessary to use antibiotics at this time. Start pred pak, albuterol inhaler, claritin, prilosec, tussionex cough syrup bedtime and tessalon 2x/day.  Your prescription(s) have been submitted to your pharmacy. Please take as directed and contact our office if you believe you are having problem(s) with the medication(s). if your symptoms continue call for referral to lung specialist for further evaluation and treatment. \

## 2011-01-21 ENCOUNTER — Telehealth: Payer: Self-pay

## 2011-01-21 DIAGNOSIS — R05 Cough: Secondary | ICD-10-CM

## 2011-01-21 NOTE — Telephone Encounter (Signed)
Refer to pulm done as requested - thanks

## 2011-01-21 NOTE — Telephone Encounter (Signed)
Pt called stating she is not feeling better and is requesting referral to lung specialist as discussed at OV

## 2011-01-21 NOTE — Telephone Encounter (Signed)
Pt informed

## 2011-02-02 ENCOUNTER — Encounter: Payer: Self-pay | Admitting: *Deleted

## 2011-02-02 ENCOUNTER — Encounter: Payer: Self-pay | Admitting: Internal Medicine

## 2011-02-02 ENCOUNTER — Ambulatory Visit (INDEPENDENT_AMBULATORY_CARE_PROVIDER_SITE_OTHER): Payer: Managed Care, Other (non HMO) | Admitting: Internal Medicine

## 2011-02-02 VITALS — BP 144/80 | HR 100 | Temp 98.4°F | Ht 66.0 in | Wt 227.0 lb

## 2011-02-02 DIAGNOSIS — R05 Cough: Secondary | ICD-10-CM

## 2011-02-02 MED ORDER — PREDNISONE (PAK) 10 MG PO TABS: ORAL_TABLET | ORAL | Status: AC

## 2011-02-02 MED ORDER — TRAMADOL HCL 50 MG PO TABS: ORAL_TABLET | ORAL | Status: DC

## 2011-02-02 MED ORDER — ESOMEPRAZOLE MAGNESIUM 40 MG PO CPDR: DELAYED_RELEASE_CAPSULE | ORAL | Status: DC

## 2011-02-02 NOTE — Patient Instructions (Addendum)
Nexium 40 mg(or Prilosec 20 mg x2)   Take 30-60 min before first meal of the day and Pepcid 20 mg at bedtime until the cough is gone for several weeks with no need for suppression  GERD (REFLUX)  is an extremely common cause of respiratory symptoms, many times with no significant heartburn at all.    It can be treated with medication, but also with lifestyle changes including avoidance of late meals, excessive alcohol, smoking cessation, and avoid fatty foods, chocolate, peppermint, colas, red wine, and acidic juices such as orange juice.  NO MINT OR MENTHOL PRODUCTS SO NO COUGH DROPS  USE SUGARLESS CANDY INSTEAD (jolley ranchers or Stover's)  NO OIL BASED VITAMINS - use powdered substitutes.   Prednisone 10 mg take  4 each am x 2 days,   2 each am x 2 days,  1 each am x2days and stop   Take delsym two tsp every 12 hours and supplement if needed with  tramadol 50 mg up to 2 every 4 hours to suppress the urge to cough. Swallowing water or using ice chips/non mint and menthol containing candies (such as lifesavers or sugarless jolly ranchers) are also effective.  You should rest your voice and avoid activities that you know make you cough.  Once you have eliminated the cough for 3 straight days try reducing the tramadol first,  then the delsym as tolerated.    Return in a week if not dramatically better.

## 2011-02-02 NOTE — Progress Notes (Signed)
Subjective:    Patient ID: Megan Wilkerson, female    DOB: [birth year], 45 y.o.   MRN: [id number]  HPI  60 yobf never smoker never asthma referred by Dr Felicity Coyer for eval of chronic cough onset December 14 2010.   02/02/2011 Initial pulmonary office eval cc cough/ fever to 102/ never productive no sore throat or sinus but very hoarse assoc with generalized anterior chest tightness minimally better with saba.  No purulent sputum.  moslty sob with cough.   MOre day than night and actually Sleeping ok without nocturnal  or early am exac of resp c/o's or need for noct saba. Already on multiple abx with resolution of fever and RUL infiltrate on cxr but cough no better.  Only responds to tussionex  Pt denies any significant sore throat, dysphagia, itching, sneezing,  nasal congestion or excess/ purulent secretions,  fever, chills, sweats, unintended wt loss, pleuritic or exertional cp, hempoptysis, orthopnea pnd or leg swelling.    Also denies any obvious fluctuation of symptoms with weather or environmental changes or other aggravating or alleviating factors.    Review of Systems  Constitutional: Negative for fever, chills and unexpected weight change.  HENT: Negative for ear pain, nosebleeds, congestion, sore throat, rhinorrhea, sneezing, trouble swallowing, dental problem, voice change, postnasal drip and sinus pressure.   Eyes: Negative for visual disturbance.  Respiratory: Positive for cough and shortness of breath. Negative for choking.   Cardiovascular: Positive for chest pain. Negative for leg swelling.  Gastrointestinal: Negative for vomiting, abdominal pain and diarrhea.  Genitourinary: Negative for difficulty urinating.  Musculoskeletal: Negative for arthralgias.  Skin: Negative for rash.  Neurological: Negative for tremors, syncope and headaches.  Hematological: Does not bruise/bleed easily.       Objective:   Physical Exam amb pleasant very hoarse mod obese bf w 227  02/02/11 HEENT:  nl dentition, turbinates, and orophanx. Nl external ear canals without cough reflex   NECK :  without JVD/Nodes/TM/ nl carotid upstrokes bilaterally   LUNGS: no acc muscle use, clear to A and P bilaterally without cough on insp or exp maneuvers   CV:  RRR  no s3 or murmur or increase in P2, no edema   ABD:  soft and nontender with nl excursion in the supine position. No bruits or organomegaly, bowel sounds nl  MS:  warm without deformities, calf tenderness, cyanosis or clubbing  SKIN: warm and dry without lesions    NEURO:  alert, approp, no deficits     CXR  02/03/2011 :  No active cardiopulmonary disease.  Resolution of previous right upper lobe pneumonia.       Assessment & Plan:

## 2011-02-03 ENCOUNTER — Encounter: Payer: Self-pay | Admitting: Internal Medicine

## 2011-02-03 NOTE — Assessment & Plan Note (Signed)
The most common causes of chronic cough in immunocompetent adults include the following: upper airway cough syndrome (UACS), previously referred to as postnasal drip syndrome (PNDS), which is caused by variety of rhinosinus conditions; (2) asthma; (3) GERD; (4) chronic bronchitis from cigarette smoking or other inhaled environmental irritants; (5) nonasthmatic eosinophilic bronchitis; and (6) bronchiectasis.   These conditions, singly or in combination, have accounted for up to 94% of the causes of chronic cough in prospective studies.   Other conditions have constituted no >6% of the causes in prospective studies These have included bronchogenic carcinoma, chronic interstitial pneumonia, sarcoidosis, left ventricular failure, ACEI-induced cough, and aspiration from a condition associated with pharyngeal dysfunction.  Of the three most common causes of chronic cough, only one (GERD)  can actually cause the other two (asthma and post nasal drip syndrome)  and perpetuate the cylce of cough inducing airway trauma, inflammation, heightened sensitivity to reflux which is prompted by the cough itself via a cyclical mechanism.    This may partially respond to steroids and look like asthma and post nasal drainage but never erradicated completely unless the cough and the secondary reflux are eliminated, preferably both at the same time.  While not intuitively obvious, many patients with chronic low grade reflux do not cough until there is a secondary insult that disturbs the protective epithelial barrier and exposes sensitive nerve endings.  This can be viral or direct physical injury such as with an endotracheal tube.   The point is that once this occurs, it is difficult to eliminate using anything but a maximally effective acid suppression regimen at least in the short run, accompanied by an appropriate diet to address non acid GERD.   See instructions for specific recommendations which were reviewed directly  with the patient who was given a copy with highlighter outlining the key components.  

## 2011-02-11 ENCOUNTER — Emergency Department (HOSPITAL_COMMUNITY)
Admission: EM | Admit: 2011-02-11 | Discharge: 2011-02-11 | Disposition: A | Discharge status: Home / Self Care | Payer: Managed Care, Other (non HMO) | Attending: Emergency Medicine | Admitting: Emergency Medicine

## 2011-02-11 ENCOUNTER — Emergency Department (HOSPITAL_COMMUNITY): Payer: Managed Care, Other (non HMO)

## 2011-02-11 DIAGNOSIS — E876 Hypokalemia: Secondary | ICD-10-CM | POA: Insufficient documentation

## 2011-02-11 DIAGNOSIS — R110 Nausea: Secondary | ICD-10-CM | POA: Insufficient documentation

## 2011-02-11 DIAGNOSIS — Z79899 Other long term (current) drug therapy: Secondary | ICD-10-CM | POA: Insufficient documentation

## 2011-02-11 DIAGNOSIS — R748 Abnormal levels of other serum enzymes: Secondary | ICD-10-CM | POA: Insufficient documentation

## 2011-02-11 DIAGNOSIS — R1011 Right upper quadrant pain: Secondary | ICD-10-CM | POA: Insufficient documentation

## 2011-02-11 LAB — CBC
HCT: 36.7 % (ref 36.0–46.0)
MCHC: 34.3 g/dL (ref 30.0–36.0)
MCV: 87 fL (ref 78.0–100.0)
Platelets: 165 10*3/uL (ref 150–400)
RDW: 12.5 % (ref 11.5–15.5)

## 2011-02-11 LAB — COMPREHENSIVE METABOLIC PANEL
AST: 62 U/L — ABNORMAL HIGH (ref 0–37)
Albumin: 3.2 g/dL — ABNORMAL LOW (ref 3.5–5.2)
BUN: 9 mg/dL (ref 6–23)
Calcium: 9.3 mg/dL (ref 8.4–10.5)
Creatinine, Ser: 0.69 mg/dL (ref 0.50–1.10)
Total Protein: 6.7 g/dL (ref 6.0–8.3)

## 2011-02-11 LAB — URINALYSIS, ROUTINE W REFLEX MICROSCOPIC
Bilirubin Urine: NEGATIVE
Ketones, ur: NEGATIVE mg/dL
Nitrite: NEGATIVE
Protein, ur: NEGATIVE mg/dL
pH: 8 (ref 5.0–8.0)

## 2011-02-11 LAB — POCT I-STAT, CHEM 8
Calcium, Ion: 1.2 mmol/L (ref 1.12–1.32)
Chloride: 105 mEq/L (ref 96–112)
Glucose, Bld: 129 mg/dL — ABNORMAL HIGH (ref 70–99)
HCT: 37 % (ref 36.0–46.0)
Hemoglobin: 12.6 g/dL (ref 12.0–15.0)

## 2011-02-11 LAB — CK TOTAL AND CKMB (NOT AT ARMC)
CK, MB: 1.3 ng/mL (ref 0.3–4.0)
Relative Index: INVALID (ref 0.0–2.5)
Total CK: 46 U/L (ref 7–177)

## 2011-02-11 LAB — DIFFERENTIAL
Eosinophils Absolute: 0.1 10*3/uL (ref 0.0–0.7)
Eosinophils Relative: 3 % (ref 0–5)
Lymphocytes Relative: 32 % (ref 12–46)
Lymphs Abs: 1.5 10*3/uL (ref 0.7–4.0)
Monocytes Absolute: 0.2 10*3/uL (ref 0.1–1.0)

## 2011-02-11 LAB — TROPONIN I: Troponin I: <0.3 ng/mL (ref <0.30)

## 2011-02-11 LAB — POCT I-STAT TROPONIN I: Troponin i, poc: 0 ng/mL (ref 0.00–0.08)

## 2011-02-11 LAB — URINE MICROSCOPIC-ADD ON

## 2011-02-11 LAB — LIPASE, BLOOD: Lipase: 31 U/L (ref 11–59)

## 2011-02-14 ENCOUNTER — Other Ambulatory Visit: Payer: Self-pay | Admitting: *Deleted

## 2011-02-14 MED ORDER — OMEPRAZOLE 40 MG PO CPDR: 40.0000 mg | DELAYED_RELEASE_CAPSULE | Freq: Every day | ORAL | Status: DC

## 2011-02-18 ENCOUNTER — Ambulatory Visit (INDEPENDENT_AMBULATORY_CARE_PROVIDER_SITE_OTHER): Payer: Managed Care, Other (non HMO) | Admitting: Internal Medicine

## 2011-02-18 ENCOUNTER — Telehealth: Payer: Self-pay | Admitting: *Deleted

## 2011-02-18 ENCOUNTER — Other Ambulatory Visit (INDEPENDENT_AMBULATORY_CARE_PROVIDER_SITE_OTHER): Payer: Managed Care, Other (non HMO)

## 2011-02-18 ENCOUNTER — Encounter: Payer: Self-pay | Admitting: Internal Medicine

## 2011-02-18 VITALS — BP 120/82 | HR 87 | Temp 98.3°F | Ht 66.0 in | Wt 224.4 lb

## 2011-02-18 DIAGNOSIS — R945 Abnormal results of liver function studies: Secondary | ICD-10-CM | POA: Insufficient documentation

## 2011-02-18 DIAGNOSIS — R1013 Epigastric pain: Secondary | ICD-10-CM

## 2011-02-18 DIAGNOSIS — K219 Gastro-esophageal reflux disease without esophagitis: Secondary | ICD-10-CM

## 2011-02-18 DIAGNOSIS — E876 Hypokalemia: Secondary | ICD-10-CM | POA: Insufficient documentation

## 2011-02-18 DIAGNOSIS — R7989 Other specified abnormal findings of blood chemistry: Secondary | ICD-10-CM

## 2011-02-18 LAB — BASIC METABOLIC PANEL
BUN: 10 mg/dL (ref 6–23)
Creatinine, Ser: 0.7 mg/dL (ref 0.4–1.2)
GFR: 113.04 mL/min (ref >60.00)
Potassium: 3.7 mEq/L (ref 3.5–5.1)

## 2011-02-18 MED ORDER — PANTOPRAZOLE SODIUM 40 MG PO TBEC: 40.0000 mg | DELAYED_RELEASE_TABLET | Freq: Every day | ORAL | Status: DC

## 2011-02-18 NOTE — Patient Instructions (Signed)
OK to stop the omeprazole (prilosec), and also the carafate as you have Start the generic protonix 40 mg per day Please go to LAB in the Basement for the blood and/or urine tests to be done today Please call the phone number [phone] (the [employer] System) for results of testing in 2-3 days;  When calling, simply dial the number, and when prompted enter the MRN number above (the Medical Record Number) and the # key, then the message should start. Please call in 3-5 days if not improved on the protonix, as you may need to be seen per GI

## 2011-02-18 NOTE — Assessment & Plan Note (Signed)
With recent ER visit, no obvious etiology such as vomiting  But does relate she had several loose stools recently with the abd pain now both improved  - for f/u today

## 2011-02-18 NOTE — Assessment & Plan Note (Addendum)
Uncontrolled symptoms on current omeprazole 40 and now out of med - - for change to protonix 40, consider refer GI if not improved 3-5 days

## 2011-02-18 NOTE — Progress Notes (Signed)
Subjective:    Patient ID: Megan Wilkerson, female    DOB: 01/08/1966, 45 y.o.   MRN: [id number]  HPI 45 yo AAF, pt of Dr Felicity Coyer, presents for ER f/u, seen recent for abd pain, normal cbc, neg UA and CXR as well as abd u/s for acute.  Tx with carafate, did have mild low K (and relates she did have incidental several episodes diarrhea prior to ER eval as well);  As well as mild elev LFT's, and asked to f/u here.  Since ER visit -  No n/v, further abd pain, diarrhea, constipation, fever, though has incidentally some increased reflux symptoms as she ran out of her PPI - needs refill.  No dysphagia, wt loss.  Denies urinary symptoms such as dysuria, frequency, urgency,or hematuria.  Did have minor small HA onset after seen in the ER ? Due to carafate, so stopped it, and still no abd discomfort.  No other new complaints.  Pt denies chest pain, increased sob or doe, wheezing, orthopnea, PND, increased LE swelling, palpitations, dizziness or syncope.  Pt denies new neurological symptoms such as new headache, or facial or extremity weakness or numbness   Pt denies polydipsia, polyuria. Past Medical History  Diagnosis Date  . ANEMIA-NOS   . GERD   . HYPERLIPIDEMIA, WITH LOW HDL   . POSITIVE PPD 1987    s/p 52mo tx  . MIGRAINE HEADACHE   . PERIPHERAL NEUROPATHY    Past Surgical History  Procedure Date  . Appendectomy 1992  . Abdominal hysterectomy 1996  . Cesarean section 1989    reports that she has never smoked. She does not have any smokeless tobacco history on file. Her alcohol and drug histories not on file. family history includes Alcohol abuse in her other; Arthritis in her other; Diabetes in her brother and mother; Heart disease in her father; Hypertension in her father and other; and Prostate cancer in her father. Allergies  Allergen Reactions  . Morphine    Current Outpatient Prescriptions on File Prior to Visit  Medication Sig Dispense Refill  . pyridOXINE (VITAMIN B-6) 100 MG tablet  Take 100 mg by mouth daily.        . simethicone (GAS-X) 80 MG chewable tablet Chew 80 mg by mouth every 6 (six) hours as needed.        . travoprost, benzalkonium, (TRAVATAN) 0.004 % ophthalmic solution Place 1 drop into both eyes at bedtime.         Review of Systems Review of Systems  Constitutional: Negative for diaphoresis and unexpected weight change.  HENT: Negative for drooling and tinnitus.   Eyes: Negative for photophobia and visual disturbance.  Respiratory: Negative for choking and stridor.   Gastrointestinal: Negative for vomiting and blood in stool.  Genitourinary: Negative for hematuria and decreased urine volume.  Musculoskeletal: Negative for gait problem.  Skin: Negative for color change and wound.  Neurological: Negative for tremors and numbness.  Psychiatric/Behavioral: Negative for decreased concentration. The patient is not hyperactive.      Objective:   Physical Exam BP 120/82  Pulse 87  Temp(Src) 98.3 F (36.8 C) (Oral)  Ht 5\' 6"  (1.676 m)  Wt 224 lb 6 oz (101.776 kg)  BMI 36.22 kg/m2  SpO2 97% Physical Exam  VS noted, not ill appearing Constitutional: Pt appears well-developed and well-nourished.  HENT: Head: Normocephalic.  Right Ear: External ear normal.  Left Ear: External ear normal.  Eyes: Conjunctivae and EOM are normal. Pupils are equal, round, and reactive  to light.  Neck: Normal range of motion. Neck supple.  Cardiovascular: Normal rate and regular rhythm.   Pulmonary/Chest: Effort normal and breath sounds normal.  Abd:  Soft, NT, non-distended, + BS, no flank tender Neurological: Pt is alert. No cranial nerve deficit.  Skin: Skin is warm. No erythema.  Psychiatric: Pt behavior is normal. Thought content normal. not anxious or dysphoric today       Assessment & Plan:

## 2011-02-18 NOTE — Telephone Encounter (Signed)
Patient requesting letter for patient to be able to return to work today. She needs letter due to previous pneumonia.

## 2011-02-18 NOTE — Assessment & Plan Note (Addendum)
Chart review shows normal lft's April 2011;  Sep 2012 recent abd u/s without acute;  For lft f/u and acute hep profile,  to f/u any worsening symptoms or concerns

## 2011-02-18 NOTE — Assessment & Plan Note (Signed)
Improved, ok to stay off the carafate as she has done, to f/u any worsening symptoms or concerns

## 2011-02-18 NOTE — Telephone Encounter (Signed)
Ok for note 

## 2011-02-19 ENCOUNTER — Encounter: Payer: Self-pay | Admitting: Internal Medicine

## 2011-02-21 LAB — HEPATITIS PANEL, ACUTE
HCV Ab: NEGATIVE
Hep A IgM: NEGATIVE
Hep B C IgM: NEGATIVE

## 2011-04-17 ENCOUNTER — Encounter (HOSPITAL_COMMUNITY): Payer: Self-pay | Admitting: Emergency Medicine

## 2011-04-17 ENCOUNTER — Emergency Department (HOSPITAL_COMMUNITY)
Admission: EM | Admit: 2011-04-17 | Discharge: 2011-04-17 | Disposition: A | Discharge status: Home / Self Care | Payer: Managed Care, Other (non HMO) | Attending: Emergency Medicine | Admitting: Emergency Medicine

## 2011-04-17 ENCOUNTER — Emergency Department (HOSPITAL_COMMUNITY): Payer: Managed Care, Other (non HMO)

## 2011-04-17 DIAGNOSIS — Z79899 Other long term (current) drug therapy: Secondary | ICD-10-CM | POA: Insufficient documentation

## 2011-04-17 DIAGNOSIS — R109 Unspecified abdominal pain: Secondary | ICD-10-CM | POA: Insufficient documentation

## 2011-04-17 DIAGNOSIS — K8020 Calculus of gallbladder without cholecystitis without obstruction: Secondary | ICD-10-CM | POA: Insufficient documentation

## 2011-04-17 DIAGNOSIS — E785 Hyperlipidemia, unspecified: Secondary | ICD-10-CM | POA: Insufficient documentation

## 2011-04-17 DIAGNOSIS — K219 Gastro-esophageal reflux disease without esophagitis: Secondary | ICD-10-CM | POA: Insufficient documentation

## 2011-04-17 DIAGNOSIS — N390 Urinary tract infection, site not specified: Secondary | ICD-10-CM | POA: Insufficient documentation

## 2011-04-17 DIAGNOSIS — R112 Nausea with vomiting, unspecified: Secondary | ICD-10-CM | POA: Insufficient documentation

## 2011-04-17 LAB — URINALYSIS, ROUTINE W REFLEX MICROSCOPIC
Bilirubin Urine: NEGATIVE
Glucose, UA: NEGATIVE mg/dL
Ketones, ur: NEGATIVE mg/dL
pH: 7.5 (ref 5.0–8.0)

## 2011-04-17 LAB — COMPREHENSIVE METABOLIC PANEL
AST: 44 U/L — ABNORMAL HIGH (ref 0–37)
Albumin: 3.6 g/dL (ref 3.5–5.2)
Alkaline Phosphatase: 86 U/L (ref 39–117)
Chloride: 105 mEq/L (ref 96–112)
Potassium: 3.8 mEq/L (ref 3.5–5.1)
Total Bilirubin: 0.4 mg/dL (ref 0.3–1.2)

## 2011-04-17 LAB — CBC
HCT: 38.8 % (ref 36.0–46.0)
Platelets: 188 10*3/uL (ref 150–400)
RBC: 4.38 MIL/uL (ref 3.87–5.11)
RDW: 12.3 % (ref 11.5–15.5)
WBC: 5.6 10*3/uL (ref 4.0–10.5)

## 2011-04-17 LAB — URINE MICROSCOPIC-ADD ON

## 2011-04-17 MED ORDER — ONDANSETRON HCL 4 MG PO TABS: 4.0000 mg | ORAL_TABLET | Freq: Three times a day (TID) | ORAL | Status: AC | PRN

## 2011-04-17 MED ORDER — CEPHALEXIN 500 MG PO CAPS: 1000.0000 mg | ORAL_CAPSULE | Freq: Two times a day (BID) | ORAL | Status: AC

## 2011-04-17 MED ORDER — ONDANSETRON HCL 4 MG/2ML IJ SOLN
4.0000 mg | Freq: Once | INTRAMUSCULAR | Status: AC
  Administered 2011-04-17: 4 mg via INTRAVENOUS
  Filled 2011-04-17: qty 2

## 2011-04-17 MED ORDER — HYDROMORPHONE HCL PF 1 MG/ML IJ SOLN
1.0000 mg | Freq: Once | INTRAMUSCULAR | Status: AC
  Administered 2011-04-17: 1 mg via INTRAVENOUS
  Filled 2011-04-17: qty 1

## 2011-04-17 MED ORDER — OXYCODONE-ACETAMINOPHEN 5-325 MG PO TABS: 1.0000 | ORAL_TABLET | Freq: Four times a day (QID) | ORAL | Status: DC | PRN

## 2011-04-17 NOTE — ED Notes (Signed)
Took Protonix and Diazepam at home at 0300.  Having N/V.

## 2011-04-17 NOTE — ED Provider Notes (Signed)
Medical screening examination/treatment/procedure(s) were performed by non-physician practitioner and as supervising physician I was immediately available for consultation/collaboration.  Hurman Horn, MD 04/17/11 [phone]

## 2011-04-17 NOTE — ED Provider Notes (Signed)
History     CSN: [id number] Arrival date & time: 04/17/2011  6:15 AM   First MD Initiated Contact with Patient 04/17/11 [phone]      Chief Complaint  Patient presents with  . Abdominal Pain    (Consider location/radiation/quality/duration/timing/severity/associated sxs/prior treatment) The history is provided by the patient.   [name] is a 45 year old female who presents with complaints of sudden onset right upper quadrant abdominal pain that woke her from sleep at 2:30 AM morning. It is sharp and constant and rated as severe. It does not radiate to any other area. It is associated with multiple episodes of vomiting without hematemesis and persistent nausea. There is no associated fever, chills, chest pain, shortness of breath, diarrhea, dysuria. Patient ate dinner at approximately 3 PM yesterday. Since the pain began she has tried taking Valium and protonix; no evidence of made a difference in her pain. She reports that in the past she has had similar pain in that it was not as bad and did not last as long. She does not remember the most recent time this occurred, but she does feel that it is related to food in the past. She has had abdominal surgeries to include an appendectomy, a C-section, and a hysterectomy.  Past Medical History  Diagnosis Date  . ANEMIA-NOS   . GERD   . HYPERLIPIDEMIA, WITH LOW HDL   . POSITIVE PPD 1987    s/p 20mo tx  . MIGRAINE HEADACHE   . PERIPHERAL NEUROPATHY     Past Surgical History  Procedure Date  . Appendectomy 1992  . Abdominal hysterectomy 1996  . Cesarean section 1989    Family History  Problem Relation Age of Onset  . Diabetes Mother   . Prostate cancer Father   . Hypertension Father   . Heart disease Father   . Diabetes Brother   . Alcohol abuse Other   . Arthritis Other   . Hypertension Other     History  Substance Use Topics  . Smoking status: Never Smoker   . Smokeless tobacco: Not on file  . Alcohol Use: No     Review of  Systems  Constitutional: Negative for fever and chills.  HENT: Negative for neck pain, neck stiffness and tinnitus.   Eyes: Negative for pain and visual disturbance.  Respiratory: Negative for cough, chest tightness and shortness of breath.   Cardiovascular: Negative for chest pain and palpitations.  Gastrointestinal: Positive for nausea, vomiting and abdominal pain. Negative for diarrhea, constipation and blood in stool.  Genitourinary: Negative for dysuria and flank pain.  Musculoskeletal: Negative for back pain and gait problem.  Skin: Negative for rash and wound.  Neurological: Negative for dizziness, weakness, numbness and headaches.  Psychiatric/Behavioral: Negative for behavioral problems and confusion.    Allergies  Morphine  Home Medications   Current Outpatient Rx  Name Route Sig Dispense Refill  . PANTOPRAZOLE SODIUM 40 MG PO TBEC Oral Take 1 tablet (40 mg total) by mouth daily. 30 tablet 11  . VITAMIN B-6 100 MG PO TABS Oral Take 100 mg by mouth daily.      Marland Kitchen SIMETHICONE 80 MG PO CHEW Oral Chew 80 mg by mouth every 6 (six) hours as needed.      . TRAVOPROST 0.004 % OP SOLN Both Eyes Place 1 drop into both eyes at bedtime.        BP 151/72  Pulse 85  Temp(Src) 98.8 F (37.1 C) (Oral)  Resp 22  Ht 5\' 6"  (  1.676 m)  Wt 220 lb 7.4 oz (100 kg)  BMI 35.58 kg/m2  SpO2 99%  Physical Exam  Nursing note and vitals reviewed. Constitutional: She is oriented to person, place, and time. She appears well-developed and well-nourished. She appears distressed.  HENT:  Head: Normocephalic and atraumatic.  Mouth/Throat: Oropharynx is clear and moist.  Eyes: Pupils are equal, round, and reactive to light.  Neck: Normal range of motion. Neck supple.  Cardiovascular: Normal rate, regular rhythm and intact distal pulses.   Pulmonary/Chest: Effort normal and breath sounds normal.  Abdominal: Soft. Normal appearance and bowel sounds are normal. She exhibits no distension. There is  tenderness in the right upper quadrant and epigastric area. There is guarding and positive Murphy's sign. There is no rigidity and no rebound.  Musculoskeletal: Normal range of motion. She exhibits no edema and no tenderness.  Neurological: She is alert and oriented to person, place, and time. No cranial nerve deficit. Coordination normal.  Skin: Skin is warm and dry. No rash noted.  Psychiatric: She has a normal mood and affect.    ED Course  Procedures (including critical care time)  Labs Reviewed  COMPREHENSIVE METABOLIC PANEL - Abnormal; Notable for the following:    Glucose, Bld 131 (*)    AST 44 (*)    GFR calc non Af Amer 85 (*)    All other components within normal limits  URINALYSIS, ROUTINE W REFLEX MICROSCOPIC - Abnormal; Notable for the following:    Hgb urine dipstick SMALL (*)    Leukocytes, UA LARGE (*)    All other components within normal limits  URINE MICROSCOPIC-ADD ON - Abnormal; Notable for the following:    Squamous Epithelial / LPF FEW (*)    All other components within normal limits  CBC  LIPASE, BLOOD   US Abdomen Complete  04/17/2011  *RADIOLOGY REPORT*  Clinical Data:  Right upper abdominal pain  COMPLETE ABDOMINAL ULTRASOUND  Comparison:  02/11/2011  Findings:  Gallbladder:  Tiny mobile stones are evident in the lumen of the gallbladder.  There is no gallbladder wall thickening or pericholecystic fluid however.  Sonographer reports no sonographic Murphy's sign.  Common bile duct:  4 mm diameter, unremarkable  Liver:  No focal lesion identified.  Within normal limits in parenchymal echogenicity.  IVC:  Appears normal.  Pancreas:  No focal abnormality seen.  Spleen:  7.3 cm craniocaudal length, unremarkable.  Right Kidney:  11.6 cm. No hydronephrosis.  Well-preserved cortex. Normal size and parenchymal echotexture without focal abnormalities.  Left Kidney:  10.8 cm. No hydronephrosis.  Well-preserved cortex. Normal size and parenchymal echotexture without focal  abnormalities.  Abdominal aorta:  No aneurysm identified.  IMPRESSION:  1.  Cholelithiasis without other ultrasound evidence of cholecystitis or biliary obstruction.  Original Report Authenticated By: Osa Craver, M.D.     1. Cholelithiasis   2. Urinary tract infection       MDM  Visual cholelithiasis confirmed on ultrasound; there is no ultrasound evidence of cholecystitis; the patient does not have an elevated white blood cell count and is afebrile. Her labs and ultrasound do not suggest choledocholithiasis. Her pain has been well controlled with one dose of IV medication in the emergency department. I spoke with Dr. Carolynne Edouard regarding outpatient followup and he agrees that this is appropriate for the patient.  The patient has a large number of leukocytes on her urinalysis with few bacteria; I will treat her for a urinary infection and advised a repeat urinalysis after  treatment.        Elwyn Reach [place], [place] 04/17/11 1106

## 2011-04-21 ENCOUNTER — Encounter (HOSPITAL_COMMUNITY): Payer: Self-pay | Admitting: Emergency Medicine

## 2011-04-21 ENCOUNTER — Other Ambulatory Visit: Payer: Self-pay

## 2011-04-21 ENCOUNTER — Emergency Department (HOSPITAL_COMMUNITY): Payer: Managed Care, Other (non HMO)

## 2011-04-21 ENCOUNTER — Inpatient Hospital Stay (HOSPITAL_COMMUNITY)
Admission: EM | Admit: 2011-04-21 | Discharge: 2011-04-23 | DRG: 419 | Disposition: A | Discharge status: Home / Self Care | Payer: Managed Care, Other (non HMO) | Source: Ambulatory Visit | Attending: Internal Medicine | Admitting: Internal Medicine

## 2011-04-21 DIAGNOSIS — G43909 Migraine, unspecified, not intractable, without status migrainosus: Secondary | ICD-10-CM | POA: Diagnosis present

## 2011-04-21 DIAGNOSIS — E785 Hyperlipidemia, unspecified: Secondary | ICD-10-CM

## 2011-04-21 DIAGNOSIS — K219 Gastro-esophageal reflux disease without esophagitis: Secondary | ICD-10-CM | POA: Diagnosis present

## 2011-04-21 DIAGNOSIS — R197 Diarrhea, unspecified: Secondary | ICD-10-CM | POA: Diagnosis present

## 2011-04-21 DIAGNOSIS — K819 Cholecystitis, unspecified: Secondary | ICD-10-CM

## 2011-04-21 DIAGNOSIS — K8010 Calculus of gallbladder with chronic cholecystitis without obstruction: Secondary | ICD-10-CM

## 2011-04-21 DIAGNOSIS — D649 Anemia, unspecified: Secondary | ICD-10-CM | POA: Diagnosis present

## 2011-04-21 DIAGNOSIS — G609 Hereditary and idiopathic neuropathy, unspecified: Secondary | ICD-10-CM | POA: Diagnosis present

## 2011-04-21 DIAGNOSIS — K8000 Calculus of gallbladder with acute cholecystitis without obstruction: Principal | ICD-10-CM | POA: Diagnosis present

## 2011-04-21 LAB — DIFFERENTIAL
Basophils Absolute: 0 10*3/uL (ref 0.0–0.1)
Basophils Relative: 0 % (ref 0–1)
Eosinophils Absolute: 0.2 10*3/uL (ref 0.0–0.7)
Neutro Abs: 2.4 10*3/uL (ref 1.7–7.7)
Neutrophils Relative %: 48 % (ref 43–77)

## 2011-04-21 LAB — CBC
MCH: 30.3 pg (ref 26.0–34.0)
MCHC: 34.6 g/dL (ref 30.0–36.0)
Platelets: 191 10*3/uL (ref 150–400)
RBC: 4.28 MIL/uL (ref 3.87–5.11)
RDW: 12.2 % (ref 11.5–15.5)
RDW: 12.4 % (ref 11.5–15.5)
WBC: 5.2 10*3/uL (ref 4.0–10.5)

## 2011-04-21 LAB — COMPREHENSIVE METABOLIC PANEL
AST: 47 U/L — ABNORMAL HIGH (ref 0–37)
Albumin: 3.5 g/dL (ref 3.5–5.2)
Alkaline Phosphatase: 82 U/L (ref 39–117)
BUN: 10 mg/dL (ref 6–23)
Chloride: 107 mEq/L (ref 96–112)
Creatinine, Ser: 0.76 mg/dL (ref 0.50–1.10)
Potassium: 3.8 mEq/L (ref 3.5–5.1)
Total Bilirubin: 0.4 mg/dL (ref 0.3–1.2)
Total Protein: 7.5 g/dL (ref 6.0–8.3)

## 2011-04-21 LAB — CREATININE, SERUM
Creatinine, Ser: 0.77 mg/dL (ref 0.50–1.10)
GFR calc Af Amer: >90 mL/min (ref >90)
GFR calc non Af Amer: >90 mL/min (ref >90)

## 2011-04-21 LAB — URINALYSIS, ROUTINE W REFLEX MICROSCOPIC
Glucose, UA: NEGATIVE mg/dL
Ketones, ur: NEGATIVE mg/dL
Nitrite: NEGATIVE
Specific Gravity, Urine: 1.02 (ref 1.005–1.030)
pH: 6 (ref 5.0–8.0)

## 2011-04-21 LAB — URINE MICROSCOPIC-ADD ON

## 2011-04-21 LAB — LIPASE, BLOOD: Lipase: 24 U/L (ref 11–59)

## 2011-04-21 LAB — SURGICAL PCR SCREEN
MRSA, PCR: NEGATIVE
Staphylococcus aureus: POSITIVE — AB

## 2011-04-21 MED ORDER — ONDANSETRON HCL 4 MG/2ML IJ SOLN
4.0000 mg | Freq: Once | INTRAMUSCULAR | Status: AC
  Administered 2011-04-21: 4 mg via INTRAVENOUS
  Filled 2011-04-21: qty 2

## 2011-04-21 MED ORDER — ENOXAPARIN SODIUM 40 MG/0.4ML ~~LOC~~ SOLN
40.0000 mg | SUBCUTANEOUS | Status: DC
  Administered 2011-04-21: 40 mg via SUBCUTANEOUS
  Filled 2011-04-21 (×3): qty 0.4

## 2011-04-21 MED ORDER — PANTOPRAZOLE SODIUM 40 MG PO TBEC
40.0000 mg | DELAYED_RELEASE_TABLET | Freq: Every day | ORAL | Status: DC
  Administered 2011-04-21 – 2011-04-23 (×2): 40 mg via ORAL
  Filled 2011-04-21: qty 1

## 2011-04-21 MED ORDER — PIPERACILLIN-TAZOBACTAM 3.375 G IVPB 30 MIN
3.3750 g | INTRAVENOUS | Status: AC
  Administered 2011-04-21: 3.375 g via INTRAVENOUS
  Filled 2011-04-21: qty 50

## 2011-04-21 MED ORDER — KCL IN DEXTROSE-NACL 20-5-0.9 MEQ/L-%-% IV SOLN
INTRAVENOUS | Status: DC
  Administered 2011-04-21: 16:00:00 via INTRAVENOUS
  Administered 2011-04-22: 125 mL/h via INTRAVENOUS
  Administered 2011-04-22: 16:00:00 via INTRAVENOUS
  Filled 2011-04-21 (×11): qty 1000

## 2011-04-21 MED ORDER — ONDANSETRON HCL 4 MG/2ML IJ SOLN
4.0000 mg | Freq: Four times a day (QID) | INTRAMUSCULAR | Status: DC | PRN
  Administered 2011-04-21: 4 mg via INTRAVENOUS
  Filled 2011-04-21: qty 2

## 2011-04-21 MED ORDER — HYDROMORPHONE HCL PF 1 MG/ML IJ SOLN
1.0000 mg | Freq: Once | INTRAMUSCULAR | Status: AC
  Administered 2011-04-21: 1 mg via INTRAVENOUS
  Filled 2011-04-21: qty 1

## 2011-04-21 MED ORDER — TRAVOPROST 0.004 % OP SOLN
1.0000 [drp] | Freq: Every day | OPHTHALMIC | Status: DC
  Administered 2011-04-21 – 2011-04-22 (×2): 1 [drp] via OPHTHALMIC
  Filled 2011-04-21: qty 0.1

## 2011-04-21 MED ORDER — PIPERACILLIN-TAZOBACTAM 3.375 G IVPB
3.3750 g | Freq: Three times a day (TID) | INTRAVENOUS | Status: DC
  Administered 2011-04-21 – 2011-04-22 (×5): 3.375 g via INTRAVENOUS
  Filled 2011-04-21 (×7): qty 50

## 2011-04-21 MED ORDER — HYDROMORPHONE HCL PF 1 MG/ML IJ SOLN
2.0000 mg | INTRAMUSCULAR | Status: DC | PRN
  Administered 2011-04-21 – 2011-04-23 (×5): 2 mg via INTRAVENOUS
  Filled 2011-04-21 (×3): qty 2
  Filled 2011-04-21 (×2): qty 1

## 2011-04-21 MED ORDER — ACETAMINOPHEN 325 MG PO TABS: 650.0000 mg | ORAL_TABLET | Freq: Four times a day (QID) | ORAL | Status: DC | PRN

## 2011-04-21 MED ORDER — ACETAMINOPHEN 650 MG RE SUPP: 650.0000 mg | Freq: Four times a day (QID) | RECTAL | Status: DC | PRN

## 2011-04-21 MED ORDER — VITAMIN B-6 100 MG PO TABS
100.0000 mg | ORAL_TABLET | Freq: Every day | ORAL | Status: DC
  Administered 2011-04-21 – 2011-04-23 (×2): 100 mg via ORAL
  Filled 2011-04-21 (×3): qty 1

## 2011-04-21 MED ORDER — OXYCODONE HCL 5 MG PO TABS
5.0000 mg | ORAL_TABLET | ORAL | Status: DC | PRN
  Administered 2011-04-21 – 2011-04-23 (×3): 5 mg via ORAL
  Filled 2011-04-21 (×3): qty 1

## 2011-04-21 NOTE — ED Notes (Signed)
Dr. Magnus Ivan is at bedside.

## 2011-04-21 NOTE — Progress Notes (Signed)
ANTIBIOTIC CONSULT NOTE - INITIAL  Pharmacy Consult for Zosyn Indication: possible cholecystitis and UTI  Allergies  Allergen Reactions  . Morphine Itching and Rash    Patient Measurements: Height: 5\' 6"  (167.6 cm) Weight: 220 lb (99.791 kg) IBW/kg (Calculated) : 59.3    Vital Signs: Temp: 97.6 F (36.4 C) (11/22 0630) Temp src: Oral (11/22 0630) BP: 111/70 mmHg (11/22 0830) Pulse Rate: 69  (11/22 0830) Intake/Output from previous day: 11/21 0701 - 11/22 0700 In: -  Out: 100 [Urine:100] Intake/Output from this shift:    Labs:  [facility] 04/21/11 0335  WBC 5.0  HGB 13.2  PLT 180  LABCREA --  CREATININE 0.76   Estimated Creatinine Clearance: 107 ml/min (by C-G formula based on Cr of 0.76). No results found for this basename: VANCOTROUGH:2,VANCOPEAK:2,VANCORANDOM:2,GENTTROUGH:2,GENTPEAK:2,GENTRANDOM:2,TOBRATROUGH:2,TOBRAPEAK:2,TOBRARND:2,AMIKACINPEAK:2,AMIKACINTROU:2,AMIKACIN:2, in the last 72 hours   Microbiology: No results found for this or any previous visit (from the past 720 hour(s)).  Medical History: Past Medical History  Diagnosis Date  . ANEMIA-NOS   . GERD   . HYPERLIPIDEMIA, WITH LOW HDL   . POSITIVE PPD 1987    s/p 75mo tx  . MIGRAINE HEADACHE   . PERIPHERAL NEUROPATHY     Medications:  See PTA medication list  Assessment: 45 yo F with several trip to the ED for epigastric and abdominal pain. Pharmacy consulted for Zosyn dosing for possible cholecystitis and UTI. Renal function is normal. Plan for lap chole tomorrow.  Plan:  Zosyn 3.375 g IV over 30 min then Zosyn 3.375g IV q8h (infise over 4 hrs)  [place], [employer]le 04/21/2011,9:50 AM

## 2011-04-21 NOTE — ED Notes (Signed)
First meeting patient. Patient states she came in last Sunday with abdominal pain and was diagnosed with gallstones. Patient states she has nausea and denies vomiting. Patient states pain started last evening and got worse around 0300 this morning and pain is in her back and comes around to her left side and abdominal area.  Patient and husband she can not go home with this continuing and never knowing when the pain will start again.

## 2011-04-21 NOTE — ED Notes (Signed)
Patient with abdominal pain, nausea and vomiting.  Patient states that the pain started in her back around 9pm and abdominal pain continuing since midnight.  Patient was diagnosed last week with gallstones, called surgeon after discharged and cannot get in to see them till Dec 10th.

## 2011-04-21 NOTE — H&P (Signed)
Megan Wilkerson is an 45 y.o. female.   Chief Complaint: abdominal pain HPI: This is a very pleasant female who has had multiple attacks over the last several months of epigastric and right upper quadrant abdominal pain. This is her second or third trip to the emergency department. She reports the pain as sharp and cramping in the epigastrium right upper quadrant. She has had nausea and vomiting. She has also had diarrhea. It is associated with fatty meals. She currently has no complaints. She denies dysuria  Past Medical History  Diagnosis Date  . ANEMIA-NOS   . GERD   . HYPERLIPIDEMIA, WITH LOW HDL   . POSITIVE PPD 1987    s/p 58mo tx  . MIGRAINE HEADACHE   . PERIPHERAL NEUROPATHY     Past Surgical History  Procedure Date  . Appendectomy 1992  . Abdominal hysterectomy 1996  . Cesarean section 1989    Family History  Problem Relation Age of Onset  . Diabetes Mother   . Prostate cancer Father   . Hypertension Father   . Heart disease Father   . Diabetes Brother   . Alcohol abuse Other   . Arthritis Other   . Hypertension Other    Social History:  reports that she has never smoked. She does not have any smokeless tobacco history on file. She reports that she does not drink alcohol. Her drug history not on file.  Allergies:  Allergies  Allergen Reactions  . Morphine Itching and Rash    Medications Prior to Admission  Medication Dose Route Frequency Provider Last Rate Last Dose  . HYDROmorphone (DILAUDID) injection 1 mg  1 mg Intravenous Once Sunnie Nielsen, MD   1 mg at 04/21/11 0402  . HYDROmorphone (DILAUDID) injection 1 mg  1 mg Intravenous Once Sunnie Nielsen, MD   1 mg at 04/21/11 0553  . ondansetron (ZOFRAN) injection 4 mg  4 mg Intravenous Once Sunnie Nielsen, MD   4 mg at 04/21/11 0402  . ondansetron (ZOFRAN) injection 4 mg  4 mg Intravenous Once Sunnie Nielsen, MD   4 mg at 04/21/11 9604   Medications Prior to Admission  Medication Sig Dispense Refill  . ondansetron  (ZOFRAN) 4 MG tablet Take 1 tablet (4 mg total) by mouth every 8 (eight) hours as needed for nausea.  12 tablet  0  . oxyCODONE-acetaminophen (PERCOCET) 5-325 MG per tablet Take 1-2 tablets by mouth every 6 (six) hours as needed for pain.  16 tablet  0  . pantoprazole (PROTONIX) 40 MG tablet Take 1 tablet (40 mg total) by mouth daily.  30 tablet  11  . pyridOXINE (VITAMIN B-6) 100 MG tablet Take 100 mg by mouth daily.        . simethicone (GAS-X) 80 MG chewable tablet Chew 80 mg by mouth every 6 (six) hours as needed.        . travoprost, benzalkonium, (TRAVATAN) 0.004 % ophthalmic solution Place 1 drop into both eyes at bedtime.        . cephALEXin (KEFLEX) 500 MG capsule Take 2 capsules (1,000 mg total) by mouth 2 (two) times daily.  12 capsule  0    Results for orders placed during the hospital encounter of 04/21/11 (from the past 48 hour(s))  CBC     Status: Normal   Collection Time   04/21/11  3:35 AM      Component Value Range Comment   WBC 5.0  4.0 - 10.5 (K/uL)    RBC 4.36  3.87 - 5.11 (MIL/uL)    Hemoglobin 13.2  12.0 - 15.0 (g/dL)    HCT 16.1  09.6 - 04.5 (%)    MCV 87.4  78.0 - 100.0 (fL)    MCH 30.3  26.0 - 34.0 (pg)    MCHC 34.6  30.0 - 36.0 (g/dL)    RDW 40.9  81.1 - 91.4 (%)    Platelets 180  150 - 400 (K/uL)   DIFFERENTIAL     Status: Normal   Collection Time   04/21/11  3:35 AM      Component Value Range Comment   Neutrophils Relative 48  43 - 77 (%)    Neutro Abs 2.4  1.7 - 7.7 (K/uL)    Lymphocytes Relative 39  12 - 46 (%)    Lymphs Abs 2.0  0.7 - 4.0 (K/uL)    Monocytes Relative 10  3 - 12 (%)    Monocytes Absolute 0.5  0.1 - 1.0 (K/uL)    Eosinophils Relative 4  0 - 5 (%)    Eosinophils Absolute 0.2  0.0 - 0.7 (K/uL)    Basophils Relative 0  0 - 1 (%)    Basophils Absolute 0.0  0.0 - 0.1 (K/uL)   COMPREHENSIVE METABOLIC PANEL     Status: Abnormal   Collection Time   04/21/11  3:35 AM      Component Value Range Comment   Sodium 141  135 - 145 (mEq/L)     Potassium 3.8  3.5 - 5.1 (mEq/L)    Chloride 107  96 - 112 (mEq/L)    CO2 24  19 - 32 (mEq/L)    Glucose, Bld 118 (*) 70 - 99 (mg/dL)    BUN 10  6 - 23 (mg/dL)    Creatinine, Ser 7.82  0.50 - 1.10 (mg/dL)    Calcium 9.2  8.4 - 10.5 (mg/dL)    Total Protein 7.5  6.0 - 8.3 (g/dL)    Albumin 3.5  3.5 - 5.2 (g/dL)    AST 47 (*) 0 - 37 (U/L) HEMOLYSIS AT THIS LEVEL MAY AFFECT RESULT   ALT 31  0 - 35 (U/L)    Alkaline Phosphatase 82  39 - 117 (U/L)    Total Bilirubin 0.4  0.3 - 1.2 (mg/dL)    GFR calc non Af Amer >90  >90 (mL/min)    GFR calc Af Amer >90  >90 (mL/min)   LIPASE, BLOOD     Status: Normal   Collection Time   04/21/11  3:35 AM      Component Value Range Comment   Lipase 24  11 - 59 (U/L)   URINALYSIS, ROUTINE W REFLEX MICROSCOPIC     Status: Abnormal   Collection Time   04/21/11  6:24 AM      Component Value Range Comment   Color, Urine YELLOW  YELLOW     Appearance CLEAR  CLEAR     Specific Gravity, Urine 1.020  1.005 - 1.030     pH 6.0  5.0 - 8.0     Glucose, UA NEGATIVE  NEGATIVE (mg/dL)    Hgb urine dipstick NEGATIVE  NEGATIVE     Bilirubin Urine NEGATIVE  NEGATIVE     Ketones, ur NEGATIVE  NEGATIVE (mg/dL)    Protein, ur NEGATIVE  NEGATIVE (mg/dL)    Urobilinogen, UA 1.0  0.0 - 1.0 (mg/dL)    Nitrite NEGATIVE  NEGATIVE     Leukocytes, UA MODERATE (*) NEGATIVE    URINE  MICROSCOPIC-ADD ON     Status: Normal   Collection Time   04/21/11  6:24 AM      Component Value Range Comment   Squamous Epithelial / LPF RARE  RARE     WBC, UA 11-20  <3 (WBC/hpf)    Bacteria, UA RARE  RARE     US Abdomen Complete  04/21/2011  *RADIOLOGY REPORT*  Clinical Data:  Abdominal pain  COMPLETE ABDOMINAL ULTRASOUND  Comparison:  04/17/2011  Findings:  Gallbladder:  Cholelithiasis again noted.  No gallbladder wall thickening or pericholecystic fluid.  Negative sonographic Murphy's sign.  Common bile duct:  Normal in diameter, measuring 4 mm.  Liver:  No focal lesion identified.  Within  normal limits in parenchymal echogenicity.  IVC:  Appears normal.  Pancreas:  No focal abnormality seen. The distal body and tail are obscured by overlying bowel gas artifact  Spleen:  Measures 7 mm.  No focal abnormality.  Right Kidney:  Measures 11.1 cm. Partially obscured by overlying bowel gas. No focal lesion identified or hydronephrosis.  Left Kidney:  Measures 10.0 cm.  No focal lesion or hydronephrosis.  Abdominal aorta:  Where visualized, measures up to 1.8 cm.  The distal aorta / bifurcation is obscured by overlying bowel gas.  IMPRESSION: Cholelithiasis without sonographic evidence for cholecystitis.  Original Report Authenticated By: Waneta Martins, M.D.    Review of Systems  Constitutional: Negative.   HENT: Negative.   Eyes: Negative.   Respiratory: Negative.   Cardiovascular: Negative.   Gastrointestinal: Positive for nausea, vomiting and abdominal pain.  Genitourinary: Negative for dysuria, urgency and frequency.  Musculoskeletal: Negative.   Skin: Negative.   Neurological: Negative.   Endo/Heme/Allergies: Negative.   Psychiatric/Behavioral: Negative.     Blood pressure 114/76, pulse 56, temperature 97.6 F (36.4 C), temperature source Oral, resp. rate 16, height 5\' 6"  (1.676 m), weight 220 lb (99.791 kg), SpO2 98.00%. Physical Exam  Constitutional: She is oriented to person, place, and time. She appears well-developed and well-nourished. She appears distressed.  HENT:  Head: Normocephalic and atraumatic.  Right Ear: External ear normal.  Left Ear: External ear normal.  Nose: Nose normal.  Mouth/Throat: Oropharynx is clear and moist. No oropharyngeal exudate.  Eyes: Conjunctivae are normal. Pupils are equal, round, and reactive to light. No scleral icterus.  Neck: Normal range of motion. Neck supple. No tracheal deviation present. No thyromegaly present.  Cardiovascular: Normal rate and intact distal pulses.   Murmur heard. Respiratory: Effort normal and breath  sounds normal. No respiratory distress. She has no wheezes.  GI: Soft. There is tenderness. There is guarding.       There is tenderness with guarding in the right upper quadrant  Musculoskeletal: Normal range of motion. She exhibits no edema and no tenderness.  Lymphadenopathy:    She has no cervical adenopathy.  Neurological: She is alert and oriented to person, place, and time.  Skin: Skin is warm and dry. No rash noted. No erythema.  Psychiatric: She has a normal mood and affect. Her behavior is normal. Judgment normal.     Assessment/Plan Patient with symptomatic cholelithiasis and possible cholecystitis.  She also may have a urinary tract infection by urinalysis. There is no evidence of cholecystitis or obstructing stone by ultrasound and her liver function tests are virtually normal. Because of her discomfort, she will be admitted to the hospital. Plans will be to continue IV fluids. I will add antibiotics. Hopefully she can undergo a laparoscopic cholecystectomy tomorrow. I did discuss  the procedure with her.  Ellamae Lybeck A 04/21/2011, 8:52 AM

## 2011-04-21 NOTE — Progress Notes (Signed)
04/21/11  2017(late entry for 1045)  Pt. Admitted to 5532;alert and oriented x3;home with husband;+ for gall stones and to have surgery 04/22/11.  Having (R) adb. Pain under (R) breast radiating to back.  Pt. Informed that she's npo after mn.  Goals to decrease pain and decrease nausea. Leandrew Koyanagi Severa Jeremiah,RN

## 2011-04-21 NOTE — ED Notes (Signed)
Patient is still in pain and not comfortable at this time.

## 2011-04-21 NOTE — ED Provider Notes (Signed)
History     CSN: [id number] Arrival date & time: 04/21/2011  3:18 AM   First MD Initiated Contact with Patient 04/21/11 [phone]      Chief Complaint  Patient presents with  . Abdominal Pain    (Consider location/radiation/quality/duration/timing/severity/associated sxs/prior treatment) Patient is a 45 y.o. female presenting with abdominal pain. The history is provided by the patient.  Abdominal Pain The primary symptoms of the illness include abdominal pain. The primary symptoms of the illness do not include fever, shortness of breath or dysuria. The current episode started 1 to 2 hours ago. The onset of the illness was gradual. The problem has not changed since onset. The illness is associated with eating. The patient states that she believes she is currently not pregnant. The patient has not had a change in bowel habit. Risk factors: Known gallstones diagnosed last week. Additional symptoms associated with the illness include back pain. Symptoms associated with the illness do not include chills, anorexia, diaphoresis, heartburn, constipation, urgency, hematuria or frequency. Significant associated medical issues include gallstones. Significant associated medical issues do not include diabetes.   Pain is sharp in nature. Pain radiates to back. Moderate to severe in severity. Associated nausea vomiting. Diagnosed with gallstones last week and hasn't called to schedule outpatient followup has not seen a surgeon yet. Doing better since evaluation until last night symptoms returned. No alleviating symptoms.   Past Medical History  Diagnosis Date  . ANEMIA-NOS   . GERD   . HYPERLIPIDEMIA, WITH LOW HDL   . POSITIVE PPD 1987    s/p 60mo tx  . MIGRAINE HEADACHE   . PERIPHERAL NEUROPATHY     Past Surgical History  Procedure Date  . Appendectomy 1992  . Abdominal hysterectomy 1996  . Cesarean section 1989    Family History  Problem Relation Age of Onset  . Diabetes Mother   . Prostate  cancer Father   . Hypertension Father   . Heart disease Father   . Diabetes Brother   . Alcohol abuse Other   . Arthritis Other   . Hypertension Other     History  Substance Use Topics  . Smoking status: Never Smoker   . Smokeless tobacco: Not on file  . Alcohol Use: No    OB History    Grav Para Term Preterm Abortions TAB SAB Ect Mult Living                  Review of Systems  Constitutional: Negative for fever, chills and diaphoresis.  HENT: Negative for neck pain and neck stiffness.   Eyes: Negative for pain.  Respiratory: Negative for shortness of breath.   Cardiovascular: Negative for chest pain.  Gastrointestinal: Positive for abdominal pain. Negative for heartburn, constipation and anorexia.  Genitourinary: Negative for dysuria, urgency, frequency and hematuria.  Musculoskeletal: Positive for back pain.  Skin: Negative for rash.  Neurological: Negative for headaches.  All other systems reviewed and are negative.    Allergies  Morphine  Home Medications   Current Outpatient Rx  Name Route Sig Dispense Refill  . ONDANSETRON HCL 4 MG PO TABS Oral Take 1 tablet (4 mg total) by mouth every 8 (eight) hours as needed for nausea. 12 tablet 0  . OXYCODONE-ACETAMINOPHEN 5-325 MG PO TABS Oral Take 1-2 tablets by mouth every 6 (six) hours as needed for pain. 16 tablet 0  . PANTOPRAZOLE SODIUM 40 MG PO TBEC Oral Take 1 tablet (40 mg total) by mouth daily. 30 tablet 11  .  VITAMIN B-6 100 MG PO TABS Oral Take 100 mg by mouth daily.      Marland Kitchen SIMETHICONE 80 MG PO CHEW Oral Chew 80 mg by mouth every 6 (six) hours as needed.      . TRAVOPROST 0.004 % OP SOLN Both Eyes Place 1 drop into both eyes at bedtime.      . CEPHALEXIN 500 MG PO CAPS Oral Take 2 capsules (1,000 mg total) by mouth 2 (two) times daily. 12 capsule 0    BP 114/76  Pulse 56  Temp(Src) 97.6 F (36.4 C) (Oral)  Resp 16  Ht 5\' 6"  (1.676 m)  Wt 220 lb (99.791 kg)  BMI 35.51 kg/m2  SpO2 98%  Physical Exam   Constitutional: She is oriented to person, place, and time. She appears well-developed and well-nourished.  HENT:  Head: Normocephalic and atraumatic.  Eyes: Conjunctivae and EOM are normal. Pupils are equal, round, and reactive to light.  Neck: Trachea normal. Neck supple. No thyromegaly present.  Cardiovascular: Normal rate, regular rhythm, S1 normal, S2 normal and normal pulses.     No systolic murmur is present   No diastolic murmur is present  Pulses:      Radial pulses are 2+ on the right side, and 2+ on the left side.  Pulmonary/Chest: Effort normal and breath sounds normal. She has no wheezes. She has no rhonchi. She has no rales. She exhibits no tenderness.  Abdominal: Soft. Normal appearance and bowel sounds are normal. There is no CVA tenderness and negative Murphy's sign.       Tender to palpation right upper quadrant with positive Murphy sign. Voluntary guarding present. No ecchymosis or discoloration  Musculoskeletal:       BLE:s Calves nontender, no cords or erythema, negative Homans sign  Neurological: She is alert and oriented to person, place, and time. She has normal strength. No cranial nerve deficit or sensory deficit. GCS eye subscore is 4. GCS verbal subscore is 5. GCS motor subscore is 6.  Skin: Skin is warm and dry. No rash noted. She is not diaphoretic.  Psychiatric: Her speech is normal.       Cooperative and appropriate    ED Course  Procedures (including critical care time)   Results for orders placed during the hospital encounter of 04/21/11  CBC      Component Value Range   WBC 5.0  4.0 - 10.5 (K/uL)   RBC 4.36  3.87 - 5.11 (MIL/uL)   Hemoglobin 13.2  12.0 - 15.0 (g/dL)   HCT 84.6  96.2 - 95.2 (%)   MCV 87.4  78.0 - 100.0 (fL)   MCH 30.3  26.0 - 34.0 (pg)   MCHC 34.6  30.0 - 36.0 (g/dL)   RDW 84.1  32.4 - 40.1 (%)   Platelets 180  150 - 400 (K/uL)  DIFFERENTIAL      Component Value Range   Neutrophils Relative 48  43 - 77 (%)   Neutro Abs 2.4   1.7 - 7.7 (K/uL)   Lymphocytes Relative 39  12 - 46 (%)   Lymphs Abs 2.0  0.7 - 4.0 (K/uL)   Monocytes Relative 10  3 - 12 (%)   Monocytes Absolute 0.5  0.1 - 1.0 (K/uL)   Eosinophils Relative 4  0 - 5 (%)   Eosinophils Absolute 0.2  0.0 - 0.7 (K/uL)   Basophils Relative 0  0 - 1 (%)   Basophils Absolute 0.0  0.0 - 0.1 (K/uL)  COMPREHENSIVE METABOLIC  PANEL      Component Value Range   Sodium 141  135 - 145 (mEq/L)   Potassium 3.8  3.5 - 5.1 (mEq/L)   Chloride 107  96 - 112 (mEq/L)   CO2 24  19 - 32 (mEq/L)   Glucose, Bld 118 (*) 70 - 99 (mg/dL)   BUN 10  6 - 23 (mg/dL)   Creatinine, Ser 1.61  0.50 - 1.10 (mg/dL)   Calcium 9.2  8.4 - 09.6 (mg/dL)   Total Protein 7.5  6.0 - 8.3 (g/dL)   Albumin 3.5  3.5 - 5.2 (g/dL)   AST 47 (*) 0 - 37 (U/L)   ALT 31  0 - 35 (U/L)   Alkaline Phosphatase 82  39 - 117 (U/L)   Total Bilirubin 0.4  0.3 - 1.2 (mg/dL)   GFR calc non Af Amer >90  >90 (mL/min)   GFR calc Af Amer >90  >90 (mL/min)  LIPASE, BLOOD      Component Value Range   Lipase 24  11 - 59 (U/L)  URINALYSIS, ROUTINE W REFLEX MICROSCOPIC      Component Value Range   Color, Urine YELLOW  YELLOW    Appearance CLEAR  CLEAR    Specific Gravity, Urine 1.020  1.005 - 1.030    pH 6.0  5.0 - 8.0    Glucose, UA NEGATIVE  NEGATIVE (mg/dL)   Hgb urine dipstick NEGATIVE  NEGATIVE    Bilirubin Urine NEGATIVE  NEGATIVE    Ketones, ur NEGATIVE  NEGATIVE (mg/dL)   Protein, ur NEGATIVE  NEGATIVE (mg/dL)   Urobilinogen, UA 1.0  0.0 - 1.0 (mg/dL)   Nitrite NEGATIVE  NEGATIVE    Leukocytes, UA MODERATE (*) NEGATIVE   URINE MICROSCOPIC-ADD ON      Component Value Range   Squamous Epithelial / LPF RARE  RARE    WBC, UA 11-20  <3 (WBC/hpf)   Bacteria, UA RARE  RARE    US Abdomen Complete  04/21/2011  *RADIOLOGY REPORT*  Clinical Data:  Abdominal pain  COMPLETE ABDOMINAL ULTRASOUND  Comparison:  04/17/2011  Findings:  Gallbladder:  Cholelithiasis again noted.  No gallbladder wall thickening or  pericholecystic fluid.  Negative sonographic Murphy's sign.  Common bile duct:  Normal in diameter, measuring 4 mm.  Liver:  No focal lesion identified.  Within normal limits in parenchymal echogenicity.  IVC:  Appears normal.  Pancreas:  No focal abnormality seen. The distal body and tail are obscured by overlying bowel gas artifact  Spleen:  Measures 7 mm.  No focal abnormality.  Right Kidney:  Measures 11.1 cm. Partially obscured by overlying bowel gas. No focal lesion identified or hydronephrosis.  Left Kidney:  Measures 10.0 cm.  No focal lesion or hydronephrosis.  Abdominal aorta:  Where visualized, measures up to 1.8 cm.  The distal aorta / bifurcation is obscured by overlying bowel gas.  IMPRESSION: Cholelithiasis without sonographic evidence for cholecystitis.  Original Report Authenticated By: Waneta Martins, M.D.   US Abdomen Complete  04/17/2011  *RADIOLOGY REPORT*  Clinical Data:  Right upper abdominal pain  COMPLETE ABDOMINAL ULTRASOUND  Comparison:  02/11/2011  Findings:  Gallbladder:  Tiny mobile stones are evident in the lumen of the gallbladder.  There is no gallbladder wall thickening or pericholecystic fluid however.  Sonographer reports no sonographic Murphy's sign.  Common bile duct:  4 mm diameter, unremarkable  Liver:  No focal lesion identified.  Within normal limits in parenchymal echogenicity.  IVC:  Appears normal.  Pancreas:  No focal abnormality seen.  Spleen:  7.3 cm craniocaudal length, unremarkable.  Right Kidney:  11.6 cm. No hydronephrosis.  Well-preserved cortex. Normal size and parenchymal echotexture without focal abnormalities.  Left Kidney:  10.8 cm. No hydronephrosis.  Well-preserved cortex. Normal size and parenchymal echotexture without focal abnormalities.  Abdominal aorta:  No aneurysm identified.  IMPRESSION:  1.  Cholelithiasis without other ultrasound evidence of cholecystitis or biliary obstruction.  Original Report Authenticated By: Osa Craver,  M.D.    7:35 AM case discussed as above with Dr. Magnus Ivan on call for general surgery. He agrees to see patient in the emergency department.    MDM   IV dialogue but for pain control Zofran for nausea. Labs obtained and reviewed as above. Ultrasound reviewed as above. General surgery consult. No fever or cholecystitis by work up.         Sunnie Nielsen, MD 04/21/11 [phone]

## 2011-04-22 ENCOUNTER — Inpatient Hospital Stay (HOSPITAL_COMMUNITY): Payer: Managed Care, Other (non HMO)

## 2011-04-22 ENCOUNTER — Inpatient Hospital Stay (HOSPITAL_COMMUNITY): Payer: Managed Care, Other (non HMO) | Admitting: Certified Registered"

## 2011-04-22 ENCOUNTER — Encounter (HOSPITAL_COMMUNITY): Payer: Self-pay | Admitting: General Surgery

## 2011-04-22 ENCOUNTER — Other Ambulatory Visit (INDEPENDENT_AMBULATORY_CARE_PROVIDER_SITE_OTHER): Payer: Self-pay | Admitting: General Surgery

## 2011-04-22 ENCOUNTER — Encounter (HOSPITAL_COMMUNITY): Payer: Self-pay | Admitting: Certified Registered"

## 2011-04-22 ENCOUNTER — Encounter (INDEPENDENT_AMBULATORY_CARE_PROVIDER_SITE_OTHER): Payer: Self-pay | Admitting: General Surgery

## 2011-04-22 ENCOUNTER — Encounter (HOSPITAL_COMMUNITY): Admission: EM | Disposition: A | Discharge status: Home / Self Care | Payer: Self-pay | Source: Ambulatory Visit

## 2011-04-22 HISTORY — PX: CHOLECYSTECTOMY: SHX55

## 2011-04-22 LAB — CBC
HCT: 36.7 % (ref 36.0–46.0)
Hemoglobin: 12 g/dL (ref 12.0–15.0)
MCH: 29.3 pg (ref 26.0–34.0)
RBC: 4.1 MIL/uL (ref 3.87–5.11)

## 2011-04-22 LAB — COMPREHENSIVE METABOLIC PANEL
AST: 24 U/L (ref 0–37)
CO2: 26 mEq/L (ref 19–32)
Calcium: 9.2 mg/dL (ref 8.4–10.5)
Creatinine, Ser: 0.77 mg/dL (ref 0.50–1.10)
GFR calc non Af Amer: >90 mL/min (ref >90)
Sodium: 143 mEq/L (ref 135–145)
Total Protein: 6.7 g/dL (ref 6.0–8.3)

## 2011-04-22 SURGERY — LAPAROSCOPIC CHOLECYSTECTOMY WITH INTRAOPERATIVE CHOLANGIOGRAM
Anesthesia: General | Site: Abdomen | Wound class: Clean Contaminated

## 2011-04-22 MED ORDER — MUPIROCIN 2 % EX OINT
1.0000 "application " | TOPICAL_OINTMENT | Freq: Two times a day (BID) | CUTANEOUS | Status: DC
  Administered 2011-04-22 (×2): 1 via NASAL
  Filled 2011-04-22: qty 22

## 2011-04-22 MED ORDER — PROPOFOL 10 MG/ML IV EMUL
INTRAVENOUS | Status: DC | PRN
  Administered 2011-04-22: 200 mg via INTRAVENOUS

## 2011-04-22 MED ORDER — MIDAZOLAM HCL 5 MG/5ML IJ SOLN
INTRAMUSCULAR | Status: DC | PRN
  Administered 2011-04-22: 2 mg via INTRAVENOUS

## 2011-04-22 MED ORDER — DEXTROSE 5 % IV SOLN
INTRAVENOUS | Status: AC
  Filled 2011-04-22: qty 1

## 2011-04-22 MED ORDER — LACTATED RINGERS IV SOLN
INTRAVENOUS | Status: DC | PRN
  Administered 2011-04-22 (×2): via INTRAVENOUS

## 2011-04-22 MED ORDER — FENTANYL CITRATE 0.05 MG/ML IJ SOLN
INTRAMUSCULAR | Status: DC | PRN
  Administered 2011-04-22 (×2): 50 ug via INTRAVENOUS
  Administered 2011-04-22 (×2): 100 ug via INTRAVENOUS
  Administered 2011-04-22 (×2): 50 ug via INTRAVENOUS

## 2011-04-22 MED ORDER — LIDOCAINE HCL (PF) 1 % IJ SOLN
INTRAMUSCULAR | Status: DC | PRN
  Administered 2011-04-22: 5 mL

## 2011-04-22 MED ORDER — CHLORHEXIDINE GLUCONATE CLOTH 2 % EX PADS
6.0000 | MEDICATED_PAD | Freq: Every day | CUTANEOUS | Status: DC
  Administered 2011-04-22: 6 via TOPICAL

## 2011-04-22 MED ORDER — SODIUM CHLORIDE 0.9 % IR SOLN
Status: DC | PRN
  Administered 2011-04-22 (×3): 1000 mL

## 2011-04-22 MED ORDER — ROCURONIUM BROMIDE 100 MG/10ML IV SOLN
INTRAVENOUS | Status: DC | PRN
  Administered 2011-04-22: 50 mg via INTRAVENOUS

## 2011-04-22 MED ORDER — IOHEXOL 300 MG/ML  SOLN
INTRAMUSCULAR | Status: DC | PRN
  Administered 2011-04-22: 35 mL

## 2011-04-22 MED ORDER — DEXTROSE 5 % IV SOLN
1.0000 g | INTRAVENOUS | Status: DC | PRN
  Administered 2011-04-22: 1 g via INTRAVENOUS

## 2011-04-22 MED ORDER — GLUCAGON HCL (RDNA) 1 MG IJ SOLR
INTRAMUSCULAR | Status: DC | PRN
  Administered 2011-04-22: 1 mg via INTRAVENOUS

## 2011-04-22 MED ORDER — ONDANSETRON HCL 4 MG/2ML IJ SOLN
INTRAMUSCULAR | Status: DC | PRN
  Administered 2011-04-22: 4 mg via INTRAVENOUS

## 2011-04-22 MED ORDER — BUPIVACAINE-EPINEPHRINE 0.25% -1:200000 IJ SOLN
INTRAMUSCULAR | Status: DC | PRN
  Administered 2011-04-22: 5 mL

## 2011-04-22 SURGICAL SUPPLY — 50 items
ADH SKN CLS APL DERMABOND .7 (GAUZE/BANDAGES/DRESSINGS)
APPLIER CLIP ROT 10 11.4 M/L (STAPLE) ×2
APR CLP MED LRG 11.4X10 (STAPLE) ×1
BAG SPEC RTRVL LRG 6X4 10 (ENDOMECHANICALS) ×1
BLADE SURG ROTATE 9660 (MISCELLANEOUS) IMPLANT
CANISTER SUCTION 2500CC (MISCELLANEOUS) ×2 IMPLANT
CHLORAPREP W/TINT 26ML (MISCELLANEOUS) ×2 IMPLANT
CLIP APPLIE ROT 10 11.4 M/L (STAPLE) ×1 IMPLANT
CLOTH BEACON ORANGE TIMEOUT ST (SAFETY) ×2 IMPLANT
COVER MAYO STAND STRL (DRAPES) ×2 IMPLANT
COVER SURGICAL LIGHT HANDLE (MISCELLANEOUS) ×2 IMPLANT
DECANTER SPIKE VIAL GLASS SM (MISCELLANEOUS) IMPLANT
DERMABOND ADVANCED (GAUZE/BANDAGES/DRESSINGS)
DERMABOND ADVANCED .7 DNX12 (GAUZE/BANDAGES/DRESSINGS) IMPLANT
DRAPE C-ARM 42X72 X-RAY (DRAPES) ×2 IMPLANT
DRAPE WARM FLUID 44X44 (DRAPE) ×2 IMPLANT
ELECT REM PT RETURN 9FT ADLT (ELECTROSURGICAL) ×2
ELECTRODE REM PT RTRN 9FT ADLT (ELECTROSURGICAL) ×1 IMPLANT
FILTER SMOKE EVAC LAPAROSHD (FILTER) ×2 IMPLANT
GLOVE BIO SURGEON STRL SZ 6 (GLOVE) ×2 IMPLANT
GLOVE BIO SURGEON STRL SZ7.5 (GLOVE) ×6 IMPLANT
GLOVE BIOGEL PI IND STRL 6.5 (GLOVE) ×1 IMPLANT
GLOVE BIOGEL PI IND STRL 7.5 (GLOVE) ×3 IMPLANT
GLOVE BIOGEL PI INDICATOR 6.5 (GLOVE) ×1
GLOVE BIOGEL PI INDICATOR 7.5 (GLOVE) ×3
GLOVE ECLIPSE 6.5 STRL STRAW (GLOVE) ×4 IMPLANT
GOWN PREVENTION PLUS XXLARGE (GOWN DISPOSABLE) ×2 IMPLANT
GOWN STRL NON-REIN LRG LVL3 (GOWN DISPOSABLE) ×12 IMPLANT
GOWN STRL REIN 2XL LVL4 (GOWN DISPOSABLE) ×2 IMPLANT
KIT BASIN OR (CUSTOM PROCEDURE TRAY) ×2 IMPLANT
KIT ROOM TURNOVER OR (KITS) ×2 IMPLANT
NS IRRIG 1000ML POUR BTL (IV SOLUTION) ×6 IMPLANT
PAD ARMBOARD 7.5X6 YLW CONV (MISCELLANEOUS) ×2 IMPLANT
POUCH SPECIMEN RETRIEVAL 10MM (ENDOMECHANICALS) ×2 IMPLANT
SCISSORS LAP 5X35 DISP (ENDOMECHANICALS) IMPLANT
SET CHOLANGIOGRAPH 5 50 .035 (SET/KITS/TRAYS/PACK) ×2 IMPLANT
SET IRRIG TUBING LAPAROSCOPIC (IRRIGATION / IRRIGATOR) ×2 IMPLANT
SLEEVE ENDOPATH XCEL 5M (ENDOMECHANICALS) ×2 IMPLANT
SLEEVE Z-THREAD 5X100MM (TROCAR) IMPLANT
SPECIMEN JAR SMALL (MISCELLANEOUS) ×2 IMPLANT
SUT MNCRL AB 4-0 PS2 18 (SUTURE) ×2 IMPLANT
TOWEL OR 17X24 6PK STRL BLUE (TOWEL DISPOSABLE) ×2 IMPLANT
TOWEL OR 17X26 10 PK STRL BLUE (TOWEL DISPOSABLE) ×2 IMPLANT
TRAY LAPAROSCOPIC (CUSTOM PROCEDURE TRAY) ×2 IMPLANT
TROCAR XCEL BLUNT TIP 100MML (ENDOMECHANICALS) ×2 IMPLANT
TROCAR XCEL NON-BLD 11X100MML (ENDOMECHANICALS) ×2 IMPLANT
TROCAR XCEL NON-BLD 5MMX100MML (ENDOMECHANICALS) ×2 IMPLANT
TROCAR Z-THREAD FIOS 11X100 BL (TROCAR) IMPLANT
TROCAR Z-THREAD FIOS 5X100MM (TROCAR) IMPLANT
WATER STERILE IRR 1000ML POUR (IV SOLUTION) IMPLANT

## 2011-04-22 NOTE — Interval H&P Note (Signed)
History and Physical Interval Note:   04/22/2011   12:06 PM   Megan Wilkerson  has presented today for surgery, with the diagnosis of cholelithiasis  The various methods of treatment have been discussed with the patient and family. After consideration of risks, benefits and other options for treatment, the patient has consented to  Procedure(s): LAPAROSCOPIC CHOLECYSTECTOMY WITH INTRAOPERATIVE CHOLANGIOGRAM as a surgical intervention .  The patients' history has been reviewed, patient examined, no change in status, stable for surgery.  I have reviewed the patients' chart and labs.  Questions were answered to the patient's satisfaction.     [facility]  MD

## 2011-04-22 NOTE — Anesthesia Postprocedure Evaluation (Deleted)
  Anesthesia Post-op Note  Patient: Megan Wilkerson  Procedure(s) Performed:  LAPAROSCOPIC CHOLECYSTECTOMY WITH INTRAOPERATIVE CHOLANGIOGRAM  Patient Location: PACU  Anesthesia Type: General  Level of Consciousness: awake, alert  and oriented  Airway and Oxygen Therapy: Patient Spontanous Breathing and Patient connected to nasal cannula oxygen  Post-op Pain: mild  Post-op Assessment: Post-op Vital signs reviewed, Patient's Cardiovascular Status Stable, Respiratory Function Stable, Patent Airway, No signs of Nausea or vomiting and Pain level controlled  Post-op Vital Signs: Reviewed and stable  Complications: No apparent anesthesia complications

## 2011-04-22 NOTE — Anesthesia Postprocedure Evaluation (Addendum)
  Anesthesia Post-op Note  Patient: Megan Wilkerson  Procedure(s) Performed:  LAPAROSCOPIC CHOLECYSTECTOMY WITH INTRAOPERATIVE CHOLANGIOGRAM  Patient Location: PACU  Anesthesia Type: General  Level of Consciousness: awake, alert  and oriented  Airway and Oxygen Therapy: Patient Spontanous Breathing and Patient connected to nasal cannula oxygen  Post-op Pain: mild  Post-op Assessment: Post-op Vital signs reviewed, Patient's Cardiovascular Status Stable, Respiratory Function Stable, Patent Airway, No signs of Nausea or vomiting and Pain level controlled  Post-op Vital Signs: Reviewed and stable  Complications: No apparent anesthesia complications     Pt transferred to floor as PACU was full due to lack of nurses.  Rm 5532. EPIC has no designation for this type of transfer.

## 2011-04-22 NOTE — Op Note (Deleted)
Laparoscopic Cholecystectomy with IOC Procedure Note  Indications: This patient presents with symptomatic gallbladder disease and will undergo laparoscopic cholecystectomy.  Pre-operative Diagnosis: Calculus of bile duct with acute cholecystitis and obstruction  Post-operative Diagnosis: Same  Surgeon: Almond Lint   Assistants: Magnus Ivan, RNFA  Anesthesia: General endotracheal anesthesia  ASA Class: 3  Procedure Details  The patient was seen again in the Holding Room. The risks, benefits, complications, treatment options, and expected outcomes were discussed with the patient. The possibilities of perforation of viscus, bleeding, recurrent infection,  the need for additional procedures, failure to diagnose a condition, the possible need to convert to an open procedure, and creating a complication requiring transfusion or operation were discussed with the patient. The likelihood of improving the patient's symptoms with return to their baseline status is good.  The patient and/or family concurred with the proposed plan, giving informed consent. The site of surgery properly noted. The patient was taken to Operating Room, identified as Megan Wilkerson and the procedure verified as Laparoscopic Cholecystectomy with Intraoperative Cholangiogram. A Time Out was held and the above information confirmed.  Prior to the induction of general anesthesia, antibiotic prophylaxis was administered. General endotracheal anesthesia was then administered and tolerated well. After the induction, the abdomen was prepped with Chloraprep and draped in the sterile fashion. The patient was positioned in the supine position.  Local anesthetic agent was injected into the skin near the umbilicus and an incision made. We dissected down to the abdominal fascia with blunt dissection.  The fascia was incised vertically and we entered the peritoneal cavity bluntly.  A pursestring suture of 0-Vicryl was placed around the  fascial opening.  The Hasson cannula was inserted and secured with the stay suture.  Pneumoperitoneum was then created with CO2 and tolerated well without any adverse changes in the patient's vital signs. An 11-mm port was placed in the subxiphoid position.  Two 5-mm ports were placed in the right upper quadrant. All skin incisions were infiltrated with a local anesthetic agent before making the incision and placing the trocars.   We positioned the patient in reverse Trendelenburg, tilted slightly to the patient's left.  The gallbladder was identified, the fundus grasped and retracted cephalad. Adhesions were lysed bluntly and with the electrocautery where indicated, taking care not to injure any adjacent organs or viscus. The infundibulum was grasped and retracted laterally, exposing the peritoneum overlying the triangle of Calot. This was then divided and exposed in a blunt fashion. A critical view of the cystic duct and cystic artery was obtained.  The cystic duct was clearly identified and bluntly dissected circumferentially.  It was quite dilated. The cystic duct was ligated with a clip distally.   An incision was made in the cystic duct and the [facility] cholangiogram catheter introduced. The catheter was secured using a clip. A cholangiogram was then obtained which showed good visualization of the distal and proximal biliary tree with no sign of filling defects or obstruction.  Contrast flowed easily into the duodenum. The catheter was then removed.   The cystic duct was then divided, and occluded with 2 Endoloops. The cystic artery was identified, dissected free, ligated with clips and divided as well.   The gallbladder was dissected from the liver bed in retrograde fashion with the electrocautery. The gallbladder was removed and placed in an Endocatch sac. The liver bed was irrigated and inspected. Hemostasis was achieved with the electrocautery. Copious irrigation was utilized and was repeatedly aspirated  until clear.  The  gallbladder and Endocatch sac were then removed through the umbilical port site.  The pursestring suture was used to close the umbilical fascia.    We again inspected the right upper quadrant for hemostasis.  Pneumoperitoneum was released as we removed the trocars.  4-0 Monocryl was used to close the skin.   Dermabond was applied. The patient was then extubated and brought to the recovery room in stable condition. Instrument, sponge, and needle counts were correct at closure and at the conclusion of the case.   Findings: Cholecystitis with Cholelithiasis and dilated intra and extrahepatic bile ducts.  No stones seen in duct.    Estimated Blood Loss: Minimal         IVF:  1100 mL crystalloid  Specimens: Gallbladder           Complications: None; patient tolerated the procedure well.         Disposition: PACU - hemodynamically stable.         Condition: stable

## 2011-04-22 NOTE — Transfer of Care (Signed)
Immediate Anesthesia Transfer of Care Note  Patient: Megan Wilkerson  Procedure(s) Performed:  LAPAROSCOPIC CHOLECYSTECTOMY WITH INTRAOPERATIVE CHOLANGIOGRAM  Patient Location: PACU  Anesthesia Type: General  Level of Consciousness: awake, alert , oriented and patient cooperative  Airway & Oxygen Therapy: Patient Spontanous Breathing  Post-op Assessment: Report given to PACU RN, Post -op Vital signs reviewed and stable and Patient moving all extremities  Post vital signs: Reviewed and stable  Complications: No apparent anesthesia complications

## 2011-04-22 NOTE — Op Note (Signed)
Laparoscopic Cholecystectomy with IOC Procedure Note  Indications: This patient presents with symptomatic gallbladder disease and will undergo laparoscopic cholecystectomy.  Pre-operative Diagnosis: Calculus of gallbladder without mention of cholecystitis or obstruction  Post-operative Diagnosis: Calculus of bile duct with acute cholecystitis without mention of obstruction  Surgeon: Almond Lint   Assistants: Consuello Bossier  Anesthesia: General endotracheal anesthesia  ASA Class: 3  Procedure Details  The patient was seen again in the Holding Room. The risks, benefits, complications, treatment options, and expected outcomes were discussed with the patient. The possibilities of  bleeding, recurrent infection, damage to nearby structures, the need for additional procedures, failure to diagnose a condition, the possible need to convert to an open procedure, and creating a complication requiring transfusion or operation were discussed with the patient. The likelihood of improving the patient's symptoms with return to their baseline status is good.  The patient and/or family concurred with the proposed plan, giving informed consent. The site of surgery properly noted. The patient was taken to Operating Room, identified as Megan Wilkerson and the procedure verified as Laparoscopic Cholecystectomy with Intraoperative Cholangiogram. A Time Out was held and the above information confirmed.  Prior to the induction of general anesthesia, antibiotic prophylaxis was administered. General endotracheal anesthesia was then administered and tolerated well. After the induction, the abdomen was prepped with Chloraprep and draped in the sterile fashion. The patient was positioned in the supine position.  Local anesthetic agent was injected into the skin near the umbilicus and an incision made. We dissected down to the abdominal fascia with blunt dissection.  The fascia was incised vertically and we entered  the peritoneal cavity bluntly.  A pursestring suture of 0-Vicryl was placed around the fascial opening.  The Hasson cannula was inserted and secured with the stay suture.  Pneumoperitoneum was then created with CO2 and tolerated well without any adverse changes in the patient's vital signs. An 11-mm port was placed in the subxiphoid position.  Two 5-mm ports were placed in the right upper quadrant. All skin incisions were infiltrated with a local anesthetic agent before making the incision and placing the trocars.   We positioned the patient in reverse Trendelenburg, tilted slightly to the patient's left.  The gallbladder was identified, the fundus grasped and retracted cephalad. Adhesions were lysed bluntly and with the electrocautery where indicated, taking care not to injure any adjacent organs or viscus. The infundibulum was grasped and retracted laterally, exposing the peritoneum overlying the triangle of Calot. This was then divided and exposed in a blunt fashion. A critical view of the cystic duct and cystic artery was obtained.  The cystic duct was clearly identified and bluntly dissected circumferentially. The cystic duct was ligated with a clip distally.   An incision was made in the cystic duct and the [facility] cholangiogram catheter introduced. The catheter was secured using a clip. A cholangiogram was then obtained which showed good visualization of the distal and proximal biliary tree with no sign of filling defects or obstruction.  Contrast did not flow into the duodenum until administration of glucagon.  Then, contrast flowed easily into the duodenum. The catheter was then removed.   The cystic duct was then ligated with clips and divided. The cystic artery was identified, dissected free, ligated with clips and divided as well. There was an anterior and posterior branch of the artery that were ligated with clips.  The gallbladder was dissected from the liver bed in retrograde fashion with the  electrocautery. The gallbladder was removed  and placed in an Endocatch sac. The liver bed was irrigated and inspected. Hemostasis was achieved with the electrocautery. Copious irrigation was utilized and was repeatedly aspirated until clear.  The gallbladder and Endocatch sac were then removed through the umbilical port site.  The pursestring suture was used to close the umbilical fascia.    We again inspected the right upper quadrant for hemostasis.  Pneumoperitoneum was released as we removed the trocars.  4-0 Monocryl was used to close the skin.   Dermabond was applied. The patient was then extubated and brought to the recovery room in stable condition. Instrument, sponge, and needle counts were correct at closure and at the conclusion of the case.   Findings: Cholecystitis with Cholelithiasis  Estimated Blood Loss: Minimal         Drains: None         Specimens: Gallbladder           Complications: None; patient tolerated the procedure well.         Disposition: PACU - hemodynamically stable.         Condition: stable

## 2011-04-22 NOTE — Preoperative (Signed)
Beta Blockers 2  Reason not to administer Beta Blockers:Not Applicable

## 2011-04-22 NOTE — Anesthesia Preprocedure Evaluation (Addendum)
Anesthesia Evaluation  Patient identified by MRN, date of birth, ID band Patient awake    Reviewed: Allergy & Precautions, H&P , NPO status , Patient's Chart, lab work & pertinent test results  Airway Mallampati: I TM Distance: >3 FB Neck ROM: Full    Dental  (+) Teeth Intact and Dental Advisory Given   Pulmonary    Pulmonary exam normal       Cardiovascular     Neuro/Psych  Headaches,  Neuromuscular disease    GI/Hepatic GERD-  Medicated and Poorly Controlled,  Endo/Other  Morbid obesity (BMI > 30)  Renal/GU      Musculoskeletal   Abdominal   Peds  Hematology   Anesthesia Other Findings   Reproductive/Obstetrics                         Anesthesia Physical Anesthesia Plan  ASA: III  Anesthesia Plan: General   Post-op Pain Management:    Induction: Intravenous  Airway Management Planned: Oral ETT  Additional Equipment:   Intra-op Plan:   Post-operative Plan: Extubation in OR  Informed Consent: I have reviewed the patients History and Physical, chart, labs and discussed the procedure including the risks, benefits and alternatives for the proposed anesthesia with the patient or authorized representative who has indicated his/her understanding and acceptance.   Dental advisory given  Plan Discussed with: CRNA and Surgeon  Anesthesia Plan Comments:        Anesthesia Quick Evaluation

## 2011-04-22 NOTE — Progress Notes (Signed)
Pt returned from surgery back to room lethargic presently. R.Jermain Curt,RN 1330 04/22/11

## 2011-04-23 ENCOUNTER — Encounter (HOSPITAL_COMMUNITY): Payer: Self-pay | Admitting: *Deleted

## 2011-04-23 DIAGNOSIS — K819 Cholecystitis, unspecified: Secondary | ICD-10-CM

## 2011-04-23 MED ORDER — OXYCODONE-ACETAMINOPHEN 5-325 MG PO TABS: 1.0000 | ORAL_TABLET | Freq: Four times a day (QID) | ORAL | Status: AC | PRN

## 2011-04-23 NOTE — Progress Notes (Signed)
1 Day Post-Op  Subjective: Pt ok, sore, nut no N/V Tol some diet. Voiding ok  Objective: Vital signs in last 24 hours: Temp:  [97.8 F (36.6 C)-99.6 F (37.6 C)] 99.6 F (37.6 C) (11/24 0540) Pulse Rate:  [60-83] 82  (11/24 0540) Resp:  [12-20] 12  (11/24 0540) BP: (101-134)/(68-91) 122/76 mmHg (11/24 0540) SpO2:  [94 %-98 %] 97 % (11/24 0540) Last BM Date: 04/21/11  Intake/Output this shift:    Physical Exam: Lungs: CTA without w/r/r Heart: Regular Abdomen: soft, ND, appropriately tender   Incisions all c/d/i without erythema or hematoma. Ext: No edema or tenderness   Labs: CBC  Basename 04/22/11 0500 04/21/11 1130  WBC 5.0 5.2  HGB 12.0 13.2  HCT 36.7 37.9  PLT 167 191   BMET  Basename 04/22/11 0500 04/21/11 1130 04/21/11 0335  NA 143 -- 141  K 3.9 -- 3.8  CL 109 -- 107  CO2 26 -- 24  GLUCOSE 104* -- 118*  BUN 8 -- 10  CREATININE 0.77 0.77 --  CALCIUM 9.2 -- 9.2   LFT  Basename 04/22/11 0500 04/21/11 0335  PROT 6.7 --  ALBUMIN 3.2* --  AST 24 --  ALT 27 --  ALKPHOS 73 --  BILITOT 0.3 --  BILIDIR -- --  IBILI -- --  LIPASE -- 24   PT/INR No results found for this basename: LABPROT:2,INR:2 in the last 72 hours ABG No results found for this basename: PHART:2,PCO2:2,PO2:2,HCO3:2 in the last 72 hours  Studies/Results: Dg Cholangiogram Operative  04/22/2011  *RADIOLOGY REPORT*  Clinical Data:   Cholelithiasis.  INTRAOPERATIVE CHOLANGIOGRAM  Technique:  Cholangiographic images from the C-arm fluoroscopic device were submitted for interpretation post-operatively.  Please see the procedural report for the amount of contrast and the fluoroscopy time utilized.  Comparison:  Ultrasound dated 04/21/2011  Findings:  Common hepatic and common bile duct are widely patent with free flow of contrast into the duodenum.  The visualized intrahepatic ducts are normal.  IMPRESSION: Normal intraoperative cholangiogram.  Original Report Authenticated By: Gwynn Burly, M.D.    Assessment: Principal Problem:  *Cholecystitis  s/p Procedure(s): LAPAROSCOPIC CHOLECYSTECTOMY WITH INTRAOPERATIVE CHOLANGIOGRAM Plan: DC home  LOS: 2 days    Carlon Davidson J 04/23/2011

## 2011-04-23 NOTE — Discharge Summary (Signed)
Physician Discharge Summary  Patient ID: Megan Wilkerson MRN: [id number] DOB/AGE: May 27, 1966 45 y.o.  Admit date: 04/21/2011 Discharge date: 04/23/2011  Admission Diagnoses: Symptomatic cholelithiasis Discharge Diagnoses:  Principal Problem:  *Cholecystitis  Procedure(s): LAPAROSCOPIC CHOLECYSTECTOMY WITH INTRAOPERATIVE CHOLANGIOGRAM  Discharged Condition: good  Hospital Course: Admitted with c/o abd pain and gallstones, no imaging evidence of inflammation. Taken to OR on 11-23 for lap chole. No intra-op or post-op issues. Stable for discharge  Consults: none   Discharge Exam: Blood pressure 122/76, pulse 82, temperature 99.6 F (37.6 C), temperature source Oral, resp. rate 12, height 5\' 6"  (1.676 m), weight 220 lb 0.3 oz (99.8 kg), SpO2 97.00%. Lungs: CTA without w/r/r Heart: Regular Abdomen: soft, ND, appropriately tender   Incisions all c/d/i without erythema or hematoma. Ext: No edema or tenderness   Disposition: Home or Self Care  Discharge Orders    Future Appointments: Provider: Department: Dept Phone: Center:   05/09/2011 10:10 AM Megan Essex III, MD [facility] [phone] None     Future Orders Please Complete By Expires   Diet - low sodium heart healthy      Increase activity slowly      Discharge instructions      Comments:   [facility] LAPAROSCOPIC SURGERY: POST OP INSTRUCTIONS Always review your discharge instruction sheet given to you by the facility where your surgery was performed. IF YOU HAVE DISABILITY OR FAMILY LEAVE FORMS, YOU MUST BRING THEM TO THE OFFICE FOR PROCESSING.   DO NOT GIVE THEM TO YOUR DOCTOR.  A prescription for pain medication may be given to you upon discharge.  Take your pain medication as prescribed, if needed.  If narcotic pain medicine is not needed, then you may take acetaminophen (Tylenol) or ibuprofen (Advil) as needed. Take your usually prescribed medications unless otherwise  directed. If you need a refill on your pain medication, please contact your pharmacy.  They will contact our office to request authorization. Prescriptions will not be filled after 5pm or on week-ends. You should follow a light diet the first few days after arrival home, such as soup and crackers, etc.  Be sure to include lots of fluids daily. Most patients will experience some swelling and bruising in the area of the incisions.  Ice packs will help.  Swelling and bruising can take several days to resolve.  It is common to experience some constipation if taking pain medication after surgery.  Increasing fluid intake and taking a stool softener (such as Colace) will usually help or prevent this problem from occurring.  A mild laxative (Milk of Magnesia or Miralax) should be taken according to package instructions if there are no bowel movements after 48 hours. Unless discharge instructions indicate otherwise, you may remove your bandages 24-48 hours after surgery, and you may shower at that time.  You may have steri-strips (small skin tapes) in place directly over the incision.  These strips should be left on the skin for 7-10 days.  If your surgeon used skin glue on the incision, you may shower in 24 hours.  The glue will flake off over the next 2-3 weeks.  Any sutures or staples will be removed at the office during your follow-up visit. ACTIVITIES:  You may resume regular (light) daily activities beginning the next day-such as daily self-care, walking, climbing stairs-gradually increasing activities as tolerated.  You may have sexual intercourse when it is comfortable.  Refrain from any heavy lifting or straining until approved by your doctor.  You may drive when you are no longer taking prescription pain medication, you can comfortably wear a seatbelt, and you can safely maneuver your car and apply brakes. RETURN TO WORK:  __________________________________________________________ Megan Wilkerson should see your  doctor in the office for a follow-up appointment approximately 2-3 weeks after your surgery.  Make sure that you call for this appointment within a day or two after you arrive home to insure a convenient appointment time. OTHER INSTRUCTIONS: __________________________________________________________________________________________________________________________ __________________________________________________________________________________________________________________________ WHEN TO CALL YOUR DOCTOR: Fever over 101.0 Inability to urinate Continued bleeding from incision. Increased pain, redness, or drainage from the incision. Increasing abdominal pain  The clinic staff is available to answer your questions during regular business hours.  Please don't hesitate to call and ask to speak to one of the nurses for clinical concerns.  If you have a medical emergency, go to the nearest emergency room or call 911.  A surgeon from [facility] is always on call at the hospital. [address], [place], [place]  [address] ? [address] [address], [place], [place]   [address] [phone] ? [phone] ? FAX [phone] Web site: [email or web address]   No dressing needed      Call MD for:  redness, tenderness, or signs of infection (pain, swelling, redness, odor or green/yellow discharge around incision site)      Call MD for:  severe uncontrolled pain      Call MD for:  temperature >100.4      Call MD for:  persistant nausea and vomiting      Other Restrictions      Comments:   May return to work 05-02-11     Current Discharge Medication List    CONTINUE these medications which have CHANGED   Details  oxyCODONE-acetaminophen (PERCOCET) 5-325 MG per tablet Take 1-2 tablets by mouth every 6 (six) hours as needed for pain. Qty: 30 tablet, Refills: 0      CONTINUE these medications which have NOT CHANGED   Details  ondansetron (ZOFRAN) 4 MG tablet Take 1  tablet (4 mg total) by mouth every 8 (eight) hours as needed for nausea. Qty: 12 tablet, Refills: 0    pantoprazole (PROTONIX) 40 MG tablet Take 1 tablet (40 mg total) by mouth daily. Qty: 30 tablet, Refills: 11   Associated Diagnoses: Esophageal reflux    pyridOXINE (VITAMIN B-6) 100 MG tablet Take 100 mg by mouth daily.      simethicone (GAS-X) 80 MG chewable tablet Chew 80 mg by mouth every 6 (six) hours as needed.      travoprost, benzalkonium, (TRAVATAN) 0.004 % ophthalmic solution Place 1 drop into both eyes at bedtime.        STOP taking these medications     cephALEXin (KEFLEX) 500 MG capsule        Follow-up Information    Follow up with [facility] on 05/03/2011. ([facility] clinic 2:40pm)    Contact information:   [address] [place] [address] [phone]          Signed: Iona Coach 04/23/2011, 7:54 AM

## 2011-04-27 ENCOUNTER — Encounter (HOSPITAL_COMMUNITY): Payer: Self-pay | Admitting: General Surgery

## 2011-05-03 ENCOUNTER — Ambulatory Visit (INDEPENDENT_AMBULATORY_CARE_PROVIDER_SITE_OTHER): Payer: Managed Care, Other (non HMO) | Admitting: General Surgery

## 2011-05-03 ENCOUNTER — Encounter (INDEPENDENT_AMBULATORY_CARE_PROVIDER_SITE_OTHER): Payer: Self-pay

## 2011-05-03 VITALS — BP 116/72 | HR 68 | Temp 96.9°F | Resp 16 | Ht 66.0 in | Wt 220.0 lb

## 2011-05-03 DIAGNOSIS — K8010 Calculus of gallbladder with chronic cholecystitis without obstruction: Secondary | ICD-10-CM

## 2011-05-03 NOTE — Patient Instructions (Signed)
You can return to work 05/09/11. Call if you have any problems.  Diet, low fat, high fiber for now/.

## 2011-05-03 NOTE — Progress Notes (Signed)
Megan Wilkerson July 25, 1965 [id number] 05/03/2011   Megan Wilkerson is a 45 y.o. female who had a laparoscopic cholecystectomy with intraoperative cholangiogram.  The pathology report confirmed Chronic cholecystitis, and cholelithiasis..  The patient reports that they are feeling well with normal bowel movements and good appetite.  The pre-operative symptoms of abdominal pain, nausea, and vomiting have resolved.    Physical examination - Incisions appear well-healed with no sign of infection or bleeding.   Abdomen - soft, non-tender  Impression:  s/p laparoscopic cholecystectomy  Plan:  She may resume a regular diet and full activity.  She may follow-up on a PRN basis. Go back to work next week 05/09/11.

## 2011-05-09 ENCOUNTER — Ambulatory Visit (INDEPENDENT_AMBULATORY_CARE_PROVIDER_SITE_OTHER): Payer: Self-pay | Admitting: General Surgery

## 2011-08-18 ENCOUNTER — Encounter: Payer: Self-pay | Admitting: Internal Medicine

## 2011-08-18 ENCOUNTER — Encounter: Payer: Self-pay | Admitting: *Deleted

## 2011-08-18 ENCOUNTER — Ambulatory Visit (INDEPENDENT_AMBULATORY_CARE_PROVIDER_SITE_OTHER): Payer: Managed Care, Other (non HMO) | Admitting: Internal Medicine

## 2011-08-18 VITALS — BP 120/90 | HR 73 | Temp 98.1°F | Ht 66.0 in | Wt 217.0 lb

## 2011-08-18 DIAGNOSIS — J040 Acute laryngitis: Secondary | ICD-10-CM

## 2011-08-18 DIAGNOSIS — J209 Acute bronchitis, unspecified: Secondary | ICD-10-CM

## 2011-08-18 DIAGNOSIS — R05 Cough: Secondary | ICD-10-CM

## 2011-08-18 MED ORDER — AZITHROMYCIN 250 MG PO TABS: ORAL_TABLET | ORAL | Status: DC

## 2011-08-18 MED ORDER — HYDROCODONE-HOMATROPINE 5-1.5 MG/5ML PO SYRP: 5.0000 mL | ORAL_SOLUTION | Freq: Four times a day (QID) | ORAL | Status: AC | PRN

## 2011-08-18 NOTE — Patient Instructions (Signed)
It was good to see you today. Azith antibiotics and hydromet cough syrup - Your prescription(s) have been submitted to your pharmacy. Please take as directed and contact our office if you believe you are having problem(s) with the medication(s). Alternate between ibuprofen and tylenol for aches, pain and fever symptoms as discussed Work excuse note provided for today and tomorrow Call if symptoms unimproved in next 10days, sooner if worse Salt Water Gargle This solution will help make your mouth and throat feel better. HOME CARE INSTRUCTIONS    Mix 1 teaspoon of salt in 8 ounces of warm water.   Gargle with this solution as much or often as you need or as directed. Swish and gargle gently if you have any sores or wounds in your mouth.   Do not swallow this mixture.  Document Released: 02/18/2004 Document Revised: 05/05/2011 Document Reviewed: 07/11/2008 [facility] Patient Information 2012 [place], [place].

## 2011-08-18 NOTE — Progress Notes (Signed)
  Subjective:    HPI  complains of cough symptoms  Onset >1 week ago, progressively worse symptoms  Initially associated with rhinorrhea, sneezing, sore throat, mild headache and low grade fever Now myalgias and mild-mod chest congestion >thick green sputum and nonstop cough, hoarse x 48h No relief with OTC meds Precipitated by sick contacts  Past Medical History  Diagnosis Date  . ANEMIA-NOS   . GERD   . HYPERLIPIDEMIA, WITH LOW HDL   . POSITIVE PPD 1987    s/p 73mo tx  . MIGRAINE HEADACHE   . PERIPHERAL NEUROPATHY     Review of Systems Constitutional: No night sweats, no unexpected weight change Pulmonary: No pleurisy or hemoptysis Cardiovascular: No chest pain or palpitations     Objective:   Physical Exam BP 120/90  Pulse 73  Temp(Src) 98.1 F (36.7 C) (Oral)  Ht 5\' 6"  (1.676 m)  Wt 217 lb (98.431 kg)  BMI 35.02 kg/m2  SpO2 96% GEN: mildly ill appearing and audible chest congestion and oarse HENT: NCAT, no sinus tenderness bilaterally, nares without discharge, oropharynx mild erythema, no exudate Eyes: Vision grossly intact, no conjunctivitis Lungs: B scattered rhonchi, no wheeze or increased work of breathing Cardiovascular: Regular rate and rhythm, no bilateral edema      Assessment & Plan:  Acute bronchitis Cough, postnasal drip related to above laryngitiis    Empiric antibiotics prescribed due to symptom duration greater than 7 days Prescription cough suppression - new prescriptions done Symptomatic care with Tylenol or Advil, hydration and rest -  salt gargle advised as needed Work excuse note x 48h provided today

## 2011-08-23 ENCOUNTER — Encounter: Payer: Self-pay | Admitting: Endocrinology

## 2011-08-23 ENCOUNTER — Ambulatory Visit (INDEPENDENT_AMBULATORY_CARE_PROVIDER_SITE_OTHER): Payer: Managed Care, Other (non HMO) | Admitting: Endocrinology

## 2011-08-23 VITALS — BP 102/74 | HR 96 | Temp 99.0°F | Wt 216.4 lb

## 2011-08-23 DIAGNOSIS — R110 Nausea: Secondary | ICD-10-CM

## 2011-08-23 MED ORDER — PROMETHAZINE HCL 25 MG/ML IJ SOLN: 25.0000 mg | Freq: Once | INTRAMUSCULAR | Status: DC

## 2011-08-23 MED ORDER — PROMETHAZINE HCL 25 MG/ML IJ SOLN
12.5000 mg | Freq: Once | INTRAMUSCULAR | Status: AC
  Administered 2011-08-23: 12.5 mg via INTRAMUSCULAR

## 2011-08-23 MED ORDER — ONDANSETRON HCL 4 MG PO TABS: 4.0000 mg | ORAL_TABLET | Freq: Three times a day (TID) | ORAL | Status: AC | PRN

## 2011-08-23 NOTE — Progress Notes (Signed)
Subjective:    Patient ID: Megan Wilkerson, female    DOB: 1965/07/03, 46 y.o.   MRN: [id number]  HPI Pt was seen here last week with resp sxs.  She now has 1 day of slight cramps throughout the abdomen, and assoc n/v Past Medical History  Diagnosis Date  . ANEMIA-NOS   . GERD   . HYPERLIPIDEMIA, WITH LOW HDL   . POSITIVE PPD 1987    s/p 8mo tx  . MIGRAINE HEADACHE   . PERIPHERAL NEUROPATHY     Past Surgical History  Procedure Date  . Appendectomy 1992  . Abdominal hysterectomy 1996  . Cesarean section 1989  . Cholecystectomy 04/22/2011    Procedure: LAPAROSCOPIC CHOLECYSTECTOMY WITH INTRAOPERATIVE CHOLANGIOGRAM;  Surgeon: Almond Lint, MD;  Location: [facility] OR;  Service: General;  Laterality: N/A;    History   Social History  . Marital Status: Married    Spouse Name: N/A    Number of Children: N/A  . Years of Education: N/A   Occupational History  . Not on file.   Social History Main Topics  . Smoking status: Never Smoker   . Smokeless tobacco: Never Used  . Alcohol Use: No  . Drug Use: No  . Sexually Active: Not on file   Other Topics Concern  . Not on file   Social History Narrative  . No narrative on file    Current Outpatient Prescriptions on File Prior to Visit  Medication Sig Dispense Refill  . HYDROcodone-homatropine (HYDROMET) 5-1.5 MG/5ML syrup Take 5 mLs by mouth every 6 (six) hours as needed for cough.  150 mL  0  . pantoprazole (PROTONIX) 40 MG tablet Take 1 tablet (40 mg total) by mouth daily.  30 tablet  11  . pyridOXINE (VITAMIN B-6) 100 MG tablet Take 100 mg by mouth daily.        . travoprost, benzalkonium, (TRAVATAN) 0.004 % ophthalmic solution Place 1 drop into both eyes at bedtime.          Allergies  Allergen Reactions  . Morphine Itching and Rash    Family History  Problem Relation Age of Onset  . Diabetes Mother   . Prostate cancer Father   . Hypertension Father   . Heart disease Father   . Diabetes Brother   . Alcohol abuse  Other   . Arthritis Other   . Hypertension Other     BP 102/74  Pulse 96  Temp(Src) 99 F (37.2 C) (Oral)  Wt 216 lb 6.4 oz (98.158 kg)  SpO2 95%    Review of Systems She has diarrhea and low-grade fever.     Objective:   Physical Exam VITAL SIGNS:  See vs page GENERAL: no distress ABDOMEN: abdomen is soft, nontender.  no hepatosplenomegaly.  not distended.  no hernia   Lab Results  Component Value Date   WBC 5.0 04/22/2011   HGB 12.0 04/22/2011   HCT 36.7 04/22/2011   PLT 167 04/22/2011   GLUCOSE 104* 04/22/2011   CHOL 185 09/10/2009   TRIG 101.0 09/10/2009   HDL 36.40* 09/10/2009   LDLCALC 128* 09/10/2009   ALT 27 04/22/2011   AST 24 04/22/2011   NA 143 04/22/2011   K 3.9 04/22/2011   CL 109 04/22/2011   CREATININE 0.77 04/22/2011   BUN 8 04/22/2011   CO2 26 04/22/2011   TSH 0.79 01/14/2010   HGBA1C 6.0 08/21/2008      Assessment & Plan:  N/v, new, uncertain etiology.  phenergan 25  mg IM Acute bronchitis, improved

## 2011-08-23 NOTE — Patient Instructions (Addendum)
Take tylenol, and drink plenty of fluids. i have sent a prescription to [facility], for an anti-nausea medication.   I hope you feel better soon.  If you don't feel better by next week, please call back.

## 2011-09-19 ENCOUNTER — Ambulatory Visit (INDEPENDENT_AMBULATORY_CARE_PROVIDER_SITE_OTHER): Payer: Managed Care, Other (non HMO) | Admitting: Internal Medicine

## 2011-09-19 ENCOUNTER — Encounter: Payer: Self-pay | Admitting: Internal Medicine

## 2011-09-19 ENCOUNTER — Encounter: Payer: Self-pay | Admitting: *Deleted

## 2011-09-19 ENCOUNTER — Other Ambulatory Visit (INDEPENDENT_AMBULATORY_CARE_PROVIDER_SITE_OTHER): Payer: Managed Care, Other (non HMO)

## 2011-09-19 VITALS — BP 108/82 | HR 84 | Temp 98.3°F | Resp 16 | Wt 219.0 lb

## 2011-09-19 DIAGNOSIS — R197 Diarrhea, unspecified: Secondary | ICD-10-CM

## 2011-09-19 DIAGNOSIS — R1013 Epigastric pain: Secondary | ICD-10-CM

## 2011-09-19 DIAGNOSIS — R112 Nausea with vomiting, unspecified: Secondary | ICD-10-CM

## 2011-09-19 LAB — CBC WITH DIFFERENTIAL/PLATELET
Basophils Relative: 1 % (ref 0.0–3.0)
Eosinophils Relative: 6.8 % — ABNORMAL HIGH (ref 0.0–5.0)
HCT: 43.1 % (ref 36.0–46.0)
MCV: 90.3 fl (ref 78.0–100.0)
Monocytes Absolute: 0.3 10*3/uL (ref 0.1–1.0)
Monocytes Relative: 5.9 % (ref 3.0–12.0)
Neutrophils Relative %: 45.1 % (ref 43.0–77.0)
Platelets: 193 10*3/uL (ref 150.0–400.0)
RBC: 4.78 Mil/uL (ref 3.87–5.11)
WBC: 4.6 10*3/uL (ref 4.5–10.5)

## 2011-09-19 LAB — BASIC METABOLIC PANEL
Chloride: 108 mEq/L (ref 96–112)
GFR: 114.57 mL/min (ref >60.00)
Potassium: 4.3 mEq/L (ref 3.5–5.1)

## 2011-09-19 LAB — HEPATIC FUNCTION PANEL
ALT: 18 U/L (ref 0–35)
Albumin: 4.2 g/dL (ref 3.5–5.2)
Total Protein: 7.7 g/dL (ref 6.0–8.3)

## 2011-09-19 MED ORDER — LOPERAMIDE HCL 2 MG PO TABS: 2.0000 mg | ORAL_TABLET | Freq: Four times a day (QID) | ORAL | Status: AC | PRN

## 2011-09-19 MED ORDER — PROMETHAZINE HCL 25 MG PO TABS: 25.0000 mg | ORAL_TABLET | Freq: Four times a day (QID) | ORAL | Status: DC | PRN

## 2011-09-19 NOTE — Progress Notes (Signed)
Subjective:    Patient ID: Megan Wilkerson, female    DOB: [birth year], 46 y.o.   MRN: [id number]  Abdominal Pain This is a new problem. The current episode started in the past 7 days. The onset quality is sudden. The problem occurs constantly. The problem has been waxing and waning. The pain is located in the epigastric region and RLQ. The pain is at a severity of 6/10. The quality of the pain is aching, burning and cramping. The abdominal pain does not radiate. Associated symptoms include diarrhea, flatus, nausea and vomiting. Pertinent negatives include no anorexia, dysuria, fever, hematochezia or hematuria. The pain is aggravated by bowel movement and eating. She has tried H2 blockers for the symptoms. The treatment provided no relief. Prior diagnostic workup includes ultrasound (03/2011 prior to chole). Her past medical history is significant for abdominal surgery and gallstones.    Past Medical History  Diagnosis Date  . ANEMIA-NOS   . GERD   . HYPERLIPIDEMIA, WITH LOW HDL   . POSITIVE PPD 1987    s/p 91mo tx  . MIGRAINE HEADACHE   . PERIPHERAL NEUROPATHY      Review of Systems  Constitutional: Negative for fever.  Gastrointestinal: Positive for nausea, vomiting, abdominal pain, diarrhea and flatus. Negative for hematochezia and anorexia.  Genitourinary: Negative for dysuria and hematuria.       Objective:   Physical Exam BP 108/82  Pulse 84  Temp(Src) 98.3 F (36.8 C) (Oral)  Resp 16  Wt 219 lb (99.338 kg)  SpO2 97% Wt Readings from Last 3 Encounters:  09/19/11 219 lb (99.338 kg)  08/23/11 216 lb 6.4 oz (98.158 kg)  08/18/11 217 lb (98.431 kg)   Constitutional: She appears well-developed and well-nourished. No distress. Nontoxic Eyes: Conjunctivae and EOM are normal. Pupils are equal, round, and reactive to light. No scleral icterus.  Neck: Normal range of motion. Neck supple. No JVD present. No thyromegaly present.  Cardiovascular: Normal rate, regular rhythm and  normal heart sounds.  No murmur heard. No BLE edema. Pulmonary/Chest: Effort normal and breath sounds normal. No respiratory distress. She has no wheezes.  Abdominal: Soft. Bowel sounds are normal. She exhibits no distension. There is no tenderness. no masses Skin: Skin is warm and dry. No rash noted. No erythema.    Lab Results  Component Value Date   WBC 5.0 04/22/2011   HGB 12.0 04/22/2011   HCT 36.7 04/22/2011   PLT 167 04/22/2011   GLUCOSE 104* 04/22/2011   CHOL 185 09/10/2009   TRIG 101.0 09/10/2009   HDL 36.40* 09/10/2009   LDLCALC 128* 09/10/2009   ALT 27 04/22/2011   AST 24 04/22/2011   NA 143 04/22/2011   K 3.9 04/22/2011   CL 109 04/22/2011   CREATININE 0.77 04/22/2011   BUN 8 04/22/2011   CO2 26 04/22/2011   TSH 0.79 01/14/2010   HGBA1C 6.0 08/21/2008        Assessment & Plan:  See problem list. Medications and labs reviewed today.

## 2011-09-19 NOTE — Patient Instructions (Addendum)
It was good to see you today. Test(s) ordered today. Your results will be called to you after review (48-72hours after test completion). If any changes need to be made, you will be notified at that time. Promethazine for nausea and vomiting and imodium as needed for diarrhea and cramping - Your prescription(s) have been submitted to your pharmacy. Please take as directed and contact our office if you believe you are having problem(s) with the medication(s). Work excuse note for today and tomorrow as discussed Abdominal Pain Many things can cause belly (abdominal) pain. Most times, the belly pain is not dangerous. The amount of belly pain does not tell how serious the problem may be. Many cases of belly pain can be watched and treated at home. HOME CARE    Do not take medicines that help you go poop (laxatives) unless told to by your doctor.   Only take medicine as told by your doctor.   Eat or drink as told by your doctor. Your doctor will tell you if you should be on a special diet.  GET HELP RIGHT AWAY IF:    The pain does not go away.   You have a fever.   You keep throwing up (vomiting).   The pain changes and is only in the right or left part of the belly.   You have bloody or tarry looking poop.  MAKE SURE YOU:    Understand these instructions.   Will watch your condition.   Will get help right away if you are not doing well or get worse.  Document Released: 11/02/2007 Document Revised: 05/05/2011 Document Reviewed: 06/01/2009 [facility] Patient Information 2012 [place], [place]. B.R.A.T. Diet Your doctor has recommended the B.R.A.T. diet for you or your child until the condition improves. This is often used to help control diarrhea and vomiting symptoms. If you or your child can tolerate clear liquids, you may have:  Bananas.   Rice.   Applesauce.   Toast (and other simple starches such as crackers, potatoes, noodles).  Be sure to avoid dairy products, meats, and fatty  foods until symptoms are better. Fruit juices such as apple, grape, and prune juice can make diarrhea worse. Avoid these. Continue this diet for 2 days or as instructed by your caregiver. Document Released: 05/16/2005 Document Revised: 05/05/2011 Document Reviewed: 11/02/2006 [facility] Patient Information 2012 [place], [place].

## 2011-09-19 NOTE — Assessment & Plan Note (Signed)
Current symptoms x 1 week, different than prior GB symptoms (s/p chole 05/2010) nausea and vomiting and diarrhea  But no fever Normal BS on exam Check labs and provide symptomatic care - promethazine and immodium prn Work note for today and tomorrow

## 2011-10-28 ENCOUNTER — Ambulatory Visit (INDEPENDENT_AMBULATORY_CARE_PROVIDER_SITE_OTHER): Payer: Managed Care, Other (non HMO) | Admitting: Internal Medicine

## 2011-10-28 ENCOUNTER — Encounter: Payer: Self-pay | Admitting: Internal Medicine

## 2011-10-28 ENCOUNTER — Other Ambulatory Visit (INDEPENDENT_AMBULATORY_CARE_PROVIDER_SITE_OTHER): Payer: Managed Care, Other (non HMO)

## 2011-10-28 VITALS — BP 110/80 | HR 70 | Temp 98.5°F | Ht 66.0 in

## 2011-10-28 DIAGNOSIS — N760 Acute vaginitis: Secondary | ICD-10-CM

## 2011-10-28 LAB — URINALYSIS, ROUTINE W REFLEX MICROSCOPIC
Nitrite: NEGATIVE
Total Protein, Urine: NEGATIVE
Urine Glucose: NEGATIVE
pH: 6 (ref 5.0–8.0)

## 2011-10-28 MED ORDER — FLUCONAZOLE 150 MG PO TABS: 150.0000 mg | ORAL_TABLET | Freq: Once | ORAL | Status: AC

## 2011-10-28 NOTE — Patient Instructions (Signed)
It was good to see you today. Test(s) ordered today. Your results will be called to you after review (48-72hours after test completion). If any changes need to be made, you will be notified at that time. Use Diflucan for probable yeast infection causing itch symptoms - Your prescription(s) have been submitted to your pharmacy. Please take as directed and contact our office if you believe you are having problem(s) with the medication(s). Will consider gynecology referral if not improved with medications Vaginitis Vaginitis is an infection. It causes soreness, swelling, and redness (inflammation) of the vagina. Many of these infections are sexually transmitted diseases (STDs). Having unprotected sex can cause further problems and complications such as:  Chronic pelvic pain.   Infertility.   Unwanted pregnancy.   Abortion.   Tubal pregnancy.   Infection passed on to the newborn.   Cancer.  CAUSES    Monilia. This is a yeast or fungus infection, not an STD.   Bacterial vaginosis. The normal balance of bacteria in the vagina is disrupted and is replaced by an overgrowth of certain bacteria.   Gonorrhea, chlamydia. These are bacterial infections that are STDs.   Vaginal sponges, diaphragms, and intrauterine devices.   Trichomoniasis. This is a STD infection caused by a parasite.   Viruses like herpes and human papillomavirus. Both are STDs.   Pregnancy.   Immunosuppression. This occurs with certain conditions such as HIV infection or cancer.   Using bubble bath.   Taking certain antibiotic medicines.   Sporadic recurrence can occur if you become sick.   Diabetes.   Steroids.   Allergic reaction. If you have an allergy to:   Douches.   Soaps.   Spermicides.   Condoms.   Scented tampons or vaginal sprays.  SYMPTOMS    Abnormal vaginal discharge.   Itching of the vagina.   Pain in the vagina.   Swelling of the vagina.  In some cases, there are no  symptoms. TREATMENT   Treatment will vary depending on the type of infection.  Bacteria or trichomonas are usually treated with oral antibiotics and sometimes vaginal cream or suppositories.   Monilia vaginitis is usually treated with vaginal creams, suppositories, or oral antifungal pills.   Viral vaginitis has no cure. However, the symptoms of herpes (a viral vaginitis) can be treated to relieve the discomfort. Human papillomavirus has no symptoms. However, there are treatments for the diseases caused by human papillomavirus.   With allergic vaginitis, you need to stop using the product that is causing the problem. Vaginal creams can be used to treat the symptoms.   When treating an STD, the sex partner should also be treated.  HOME CARE INSTRUCTIONS    Take all the medicines as directed by your caregiver.   Do not use scented tampons, soaps, or vaginal sprays.   Do not douche.   Tell your sex partner if you have a vaginal infection or an STD.   Do not have sexual intercourse until you have treated the vaginitis.   Practice safe sex by using condoms.  SEEK MEDICAL CARE IF:    You have abdominal pain.   Your symptoms get worse during treatment.  Document Released: 03/13/2007 Document Revised: 05/05/2011 Document Reviewed: 11/06/2008 [facility] Patient Information 2012 [place], [place].

## 2011-10-28 NOTE — Progress Notes (Signed)
Subjective:    Patient ID: Megan Wilkerson, female    DOB: [birth year], 46 y.o.   MRN: [id number]  Vaginal Itching The patient's primary symptoms include genital itching. This is a new problem. The current episode started in the past 7 days. The problem occurs constantly. The problem has been gradually worsening. The patient is experiencing no pain. The problem affects both sides. She is not pregnant. Associated symptoms include abdominal pain, dysuria and urgency. Pertinent negatives include no anorexia, back pain, chills, constipation, fever, flank pain, frequency, headaches, hematuria, joint swelling, sore throat or vomiting. The vaginal discharge was thick, yellow and mucoid. The vaginal bleeding is spotting. She has not been passing clots. She has not been passing tissue. The symptoms are aggravated by urinating and intercourse. She has tried nothing for the symptoms. She is sexually active. No, her partner does not have an STD. She uses hysterectomy for contraception. Her past medical history is significant for an abdominal surgery and a gynecological surgery. There is no history of an STD or vaginosis.    Past Medical History  Diagnosis Date  . ANEMIA-NOS   . GERD   . HYPERLIPIDEMIA, WITH LOW HDL   . 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 MIGRAINE HEADACHE   . PERIPHERAL NEUROPATHY      Review of Systems  Constitutional: Negative for fever and chills.  HENT: Negative for sore throat.   Gastrointestinal: Positive for abdominal pain and flatus. Negative for vomiting, constipation, hematochezia and anorexia.  Genitourinary: Positive for dysuria and urgency. Negative for frequency, hematuria and flank pain.  Musculoskeletal: Negative for back pain.  Neurological: Negative for headaches.       Objective:   Physical Exam  BP 110/80  Pulse 70  Temp(Src) 98.5 F (36.9 C) (Oral)  Ht 5\' 6"  (1.676 m)  SpO2 98% Wt Readings from Last 3 Encounters:  09/19/11 219 lb (99.338 kg)    08/23/11 216 lb 6.4 oz (98.158 kg)  08/18/11 217 lb (98.431 kg)   Constitutional: She appears well-developed and well-nourished. No distress. Nontoxic Cardiovascular: Normal rate, regular rhythm and normal heart sounds.  No murmur heard. No BLE edema. Pulmonary/Chest: Effort normal and breath sounds normal. No respiratory distress. She has no wheezes.  Abdominal: Soft. Bowel sounds are normal. She exhibits no distension. There is no tenderness. no masses GU: defer Skin: Skin is warm and dry. No rash noted. No erythema.    Lab Results  Component Value Date   WBC 4.6 09/19/2011   HGB 14.8 09/19/2011   HCT 43.1 09/19/2011   PLT 193.0 09/19/2011   GLUCOSE 98 09/19/2011   CHOL 185 09/10/2009   TRIG 101.0 09/10/2009   HDL 36.40* 09/10/2009   LDLCALC 128* 09/10/2009   ALT 18 09/19/2011   AST 21 09/19/2011   NA 143 09/19/2011   K 4.3 09/19/2011   CL 108 09/19/2011   CREATININE 0.7 09/19/2011   BUN 10 09/19/2011   CO2 29 09/19/2011   TSH 0.79 01/14/2010   HGBA1C 6.0 08/21/2008        Assessment & Plan:   Vaginitis - suspect yeast by hx - Also check Urine for STD and UTI  Treat with diflucan - erx done Will also refer to gyn for eval if testing unremarkable and symptoms unimproved with antifungal

## 2011-10-29 LAB — CHLAMYDIA PROBE AMPLIFICATION, URINE: Chlamydia, Swab/Urine, PCR: NEGATIVE

## 2012-01-31 ENCOUNTER — Encounter: Payer: Self-pay | Admitting: Internal Medicine

## 2012-01-31 ENCOUNTER — Other Ambulatory Visit (INDEPENDENT_AMBULATORY_CARE_PROVIDER_SITE_OTHER): Payer: Managed Care, Other (non HMO)

## 2012-01-31 ENCOUNTER — Ambulatory Visit (INDEPENDENT_AMBULATORY_CARE_PROVIDER_SITE_OTHER): Payer: Managed Care, Other (non HMO) | Admitting: Internal Medicine

## 2012-01-31 VITALS — BP 100/80 | HR 74 | Temp 98.4°F | Ht 66.0 in | Wt 214.4 lb

## 2012-01-31 DIAGNOSIS — L509 Urticaria, unspecified: Secondary | ICD-10-CM

## 2012-01-31 LAB — CBC WITH DIFFERENTIAL/PLATELET
Basophils Relative: 0.6 % (ref 0.0–3.0)
Eosinophils Absolute: 0.3 10*3/uL (ref 0.0–0.7)
HCT: 44.9 % (ref 36.0–46.0)
Lymphs Abs: 1.9 10*3/uL (ref 0.7–4.0)
MCHC: 34.2 g/dL (ref 30.0–36.0)
MCV: 89.5 fl (ref 78.0–100.0)
Monocytes Absolute: 0.3 10*3/uL (ref 0.1–1.0)
Neutrophils Relative %: 49.1 % (ref 43.0–77.0)
Platelets: 225 10*3/uL (ref 150.0–400.0)
RBC: 5.02 Mil/uL (ref 3.87–5.11)

## 2012-01-31 MED ORDER — METHYLPREDNISOLONE ACETATE 80 MG/ML IJ SUSP
120.0000 mg | Freq: Once | INTRAMUSCULAR | Status: AC
  Administered 2012-01-31: 120 mg via INTRAMUSCULAR

## 2012-01-31 NOTE — Progress Notes (Signed)
Subjective:    Patient ID: Megan Wilkerson, female    DOB: [birth year], 46 y.o.   MRN: [id number]  HPI  complains of hives -  Diffuse involving trunk, arms, neck, face  No history of same Onset 3 days ago Improved with benadryl, but recurrent rash associated with itch and fatigue Denies lip swelling, shortness of breath or wheeze Denies new exposure to pet/food or meds - specifically no ibuprofen or ASA use in past 3 days No hx asthma but + allergies  Past Medical History  Diagnosis Date  . ANEMIA-NOS   . GERD   . HYPERLIPIDEMIA, WITH LOW HDL   . POSITIVE PPD 1987    s/p 79mo tx  . MIGRAINE HEADACHE   . PERIPHERAL NEUROPATHY     Review of Systems  Constitutional: Positive for fatigue. Negative for fever.  HENT: Positive for voice change. Negative for sinus pressure.   Respiratory: Negative for cough, shortness of breath, wheezing and stridor.   Cardiovascular: Negative for chest pain, palpitations and leg swelling.  Musculoskeletal: Negative for joint swelling and arthralgias.  Skin: Positive for rash.  Neurological: Negative for dizziness and headaches.       Objective:   Physical Exam  BP 100/80  Pulse 74  Temp 98.4 F (36.9 C) (Oral)  Ht 5\' 6"  (1.676 m)  Wt 214 lb 6.4 oz (97.251 kg)  BMI 34.60 kg/m2  SpO2 97% Constitutional: She is oriented to person, place, and time. She appears well-developed. Hoarse, but nontoxic appearing  HENT: Head: Normocephalic and atraumatic. Ears; B TMs ok, no erythema or effusion; Nose: Nose normal. Mouth/Throat: Oropharynx is clear and moist, mod erythema. No oropharyngeal exudate. no angioedema of lips/tongue/OP - Eyes: Conjunctivae and EOM are normal. Pupils are equal, round, and reactive to light. No scleral icterus.  Neck: Normal range of motion. Neck supple. No JVD or LAD present. No thyromegaly present.  Cardiovascular: Normal rate, regular rhythm and normal heart sounds.  No murmur heard. No BLE edema. Pulmonary/Chest:  Effort normal and breath sounds clear. No respiratory distress. She has no wheezes.  Skin: few hives on LUE and L flank - face, neck and front torso spared at this time - Skin is warm and dry. No diffuse rash noted. No erythema.  Psychiatric: She has a normal mood and affect. Her behavior is normal. Judgment and thought content normal.      Lab Results  Component Value Date   WBC 4.6 09/19/2011   HGB 14.8 09/19/2011   HCT 43.1 09/19/2011   PLT 193.0 09/19/2011   CHOL 185 09/10/2009   TRIG 101.0 09/10/2009   HDL 36.40* 09/10/2009   ALT 18 09/19/2011   AST 21 09/19/2011   NA 143 09/19/2011   K 4.3 09/19/2011   CL 108 09/19/2011   CREATININE 0.7 09/19/2011   BUN 10 09/19/2011   CO2 29 09/19/2011   TSH 0.79 01/14/2010   HGBA1C 6.0 08/21/2008      Assessment & Plan:   Hives, etiology unknown - improved with benadryl but recurrent symptoms (last antihistamine 12h ago) check allergy panel for environmental and food allergies IM medrol now Continue benadryl prn (or other antihistamine) Education provided

## 2012-01-31 NOTE — Patient Instructions (Addendum)
Hives Hives (urticaria) are itchy, red, swollen patches on the skin. They may change size, shape, and location quickly and repeatedly. Hives that occur deeper in the skin can cause swelling of the hands, feet, and face. Hives may be an allergic reaction to something you or your child ate, touched, or put on the skin. Hives can also be a reaction to cold, heat, viral infections, medication, insect bites, or emotional stress. Often the cause is hard to find. Hives can come and go for several days to several weeks. Hives are not contagious. HOME CARE INSTRUCTIONS   If the cause of the hives is known, avoid exposure to that source.   To relieve itching and rash:   Apply cold compresses to the skin or take cool water baths. Do not take or give your child hot baths or showers because the warmth will make the itching worse.   The best medicine for hives is an antihistamine. An antihistamine will not cure hives, but it will reduce their severity. You can use an antihistamine available over the counter. This medicine may make your child sleepy. Teenagers should not drive while using this medicine.   Take or give an antihistamine every 6 hours until the hives are completely gone for 24 hours or as directed.   Your child may have other medications prescribed for itching. Give these as directed by your child's caregiver.   You or your child should wear loose fitting clothing, including undergarments. Skin irritations may make hives worse.   Follow-up as directed by your caregiver.  SEEK MEDICAL CARE IF:   You or your child still have considerable itching after taking the medication (prescribed or purchased over the counter).   Joint swelling or pain occurs.  SEEK IMMEDIATE MEDICAL CARE IF:   You have a fever.   Swollen lips or tongue are noticed.   There is difficulty with breathing, swallowing, or tightness in the throat or chest.   Abdominal pain develops.   Your child starts acting very  sick.  These may be the first signs of a life-threatening allergic reaction. THIS IS AN EMERGENCY. Call 911 for medical help. MAKE SURE YOU:   Understand these instructions.   Will watch your condition.   Will get help right away if you are not doing well or get worse.  Document Released: 05/16/2005 Document Revised: 05/05/2011 Document Reviewed: 01/04/2008 [employer] Patient Information 2012 [employer]. 

## 2012-02-03 LAB — ALLERGY PROFILE REGION II-DC, DE, MD, ~~LOC~~, VA
Allergen, D pternoyssinus,d7: 2.98 kU/L — ABNORMAL HIGH
Alternaria Alternata: <0.1 kU/L
Bermuda Grass: 3 kU/L — ABNORMAL HIGH
Box Elder IgE: 0.26 kU/L — ABNORMAL HIGH
Cat Dander: <0.1 kU/L
Common Ragweed: 21.2 kU/L — ABNORMAL HIGH
D. farinae: 9.3 kU/L — ABNORMAL HIGH
Dog Dander: 0.35 kU/L — ABNORMAL HIGH
Oak: 4.89 kU/L — ABNORMAL HIGH
Pecan/Hickory Tree IgE: <0.1 kU/L

## 2012-02-03 LAB — ALLERGEN FOOD PROFILE SPECIFIC IGE
Corn: 0.63 kU/L — ABNORMAL HIGH
Peanut IgE: 0.28 kU/L — ABNORMAL HIGH
Soybean IgE: 0.38 kU/L — ABNORMAL HIGH
Tomato IgE: 4.08 kU/L — ABNORMAL HIGH
Wheat IgE: 14.2 kU/L — ABNORMAL HIGH

## 2012-02-06 NOTE — Addendum Note (Signed)
Addended by: Rene Paci A on: 02/06/2012 08:08 AM   Modules accepted: Orders

## 2012-02-09 ENCOUNTER — Ambulatory Visit (INDEPENDENT_AMBULATORY_CARE_PROVIDER_SITE_OTHER): Payer: Self-pay | Admitting: Internal Medicine

## 2012-02-09 ENCOUNTER — Encounter: Payer: Self-pay | Admitting: Internal Medicine

## 2012-02-09 VITALS — BP 110/78 | HR 83 | Temp 98.1°F | Resp 16 | Wt 212.2 lb

## 2012-02-09 DIAGNOSIS — T781XXA Other adverse food reactions, not elsewhere classified, initial encounter: Secondary | ICD-10-CM

## 2012-02-09 DIAGNOSIS — Z91018 Allergy to other foods: Secondary | ICD-10-CM

## 2012-02-09 DIAGNOSIS — L509 Urticaria, unspecified: Secondary | ICD-10-CM

## 2012-02-09 MED ORDER — EPINEPHRINE 0.3 MG/0.3ML IJ DEVI: 0.3000 mg | Freq: Once | INTRAMUSCULAR | Status: DC

## 2012-02-09 MED ORDER — CETIRIZINE HCL 10 MG PO TABS: 10.0000 mg | ORAL_TABLET | Freq: Every day | ORAL | Status: DC

## 2012-02-09 MED ORDER — PREDNISONE (PAK) 10 MG PO TABS: 10.0000 mg | ORAL_TABLET | ORAL | Status: AC

## 2012-02-09 NOTE — Patient Instructions (Addendum)
It was good to see you today. Use Zyrtec every day, Pred taper x next 12 days, and Benadryl is needed Also EpiPen IF trouble breathing  Your prescription(s) have been submitted to your pharmacy. Please take as directed and contact our office if you believe you are having problem(s) with the medication(s). Work excuse given today from 9/3 through the weekend Keep apportionment with Dr Maple Hudson, call sooner if other problems Avoid foods as much as able as reviewed today   Hives Hives (urticaria) are itchy, red, swollen patches on the skin. They may change size, shape, and location quickly and repeatedly. Hives that occur deeper in the skin can cause swelling of the hands, feet, and face. Hives may be an allergic reaction to something you or your child ate, touched, or put on the skin. Hives can also be a reaction to cold, heat, viral infections, medication, insect bites, or emotional stress. Often the cause is hard to find. Hives can come and go for several days to several weeks. Hives are not contagious. HOME CARE INSTRUCTIONS    If the cause of the hives is known, avoid exposure to that source.   To relieve itching and rash:   Apply cold compresses to the skin or take cool water baths. Do not take or give your child hot baths or showers because the warmth will make the itching worse.   The best medicine for hives is an antihistamine. An antihistamine will not cure hives, but it will reduce their severity. You can use an antihistamine available over the counter. This medicine may make your child sleepy. Teenagers should not drive while using this medicine.   Take or give an antihistamine every 6 hours until the hives are completely gone for 24 hours or as directed.   Your child may have other medications prescribed for itching. Give these as directed by your child's caregiver.   You or your child should wear loose fitting clothing, including undergarments. Skin irritations may make hives worse.     Follow-up as directed by your caregiver.  SEEK MEDICAL CARE IF:    You or your child still have considerable itching after taking the medication (prescribed or purchased over the counter).   Joint swelling or pain occurs.  SEEK IMMEDIATE MEDICAL CARE IF:    You have a fever.   Swollen lips or tongue are noticed.   There is difficulty with breathing, swallowing, or tightness in the throat or chest.   Abdominal pain develops.   Your child starts acting very sick.  These may be the first signs of a life-threatening allergic reaction. THIS IS AN EMERGENCY. Call 911 for medical help. MAKE SURE YOU:    Understand these instructions.   Will watch your condition.   Will get help right away if you are not doing well or get worse.  Document Released: 05/16/2005 Document Revised: 05/05/2011 Document Reviewed: 01/04/2008 [facility] Patient Information 2012 [place], [place].

## 2012-02-09 NOTE — Progress Notes (Signed)
Subjective:    Patient ID: Megan Wilkerson, female    DOB: 15-May-1966, 46 y.o.   MRN: [id number]  HPI  complains of continued hives -  Diffuse involving trunk, arms, neck, face  Onset 01/30/12 - no prior history of same Initially improved with benadryl, but less relief now as last week -recurrent whelps and itch Denies lip swelling, shortness of breath or wheeze - but episode of "throat and mouth itching" 2 days ago Denies new exposure to pet/food or meds - specifically no ibuprofen or ASA use No hx asthma but + allergies  Past Medical History  Diagnosis Date  . ANEMIA-NOS   . GERD   . HYPERLIPIDEMIA, WITH LOW HDL   . POSITIVE PPD 1987    s/p 40mo tx  . MIGRAINE HEADACHE   . PERIPHERAL NEUROPATHY     Review of Systems  Constitutional: Positive for fatigue. Negative for fever.  HENT: Positive for voice change. Negative for sinus pressure.   Respiratory: Negative for cough, shortness of breath, wheezing and stridor.   Cardiovascular: Negative for chest pain, palpitations and leg swelling.  Musculoskeletal: Negative for joint swelling and arthralgias.  Skin: Positive for rash.  Neurological: Negative for dizziness and headaches.       Objective:   Physical Exam  BP 110/78  Pulse 83  Temp 98.1 F (36.7 C) (Oral)  Resp 16  Wt 212 lb 4 oz (96.276 kg)  SpO2 98% Constitutional: She appears well-developed. Hoarse, but nontoxic appearing  HENT: Head: Normocephalic and atraumatic. Ears; B TMs ok, no erythema or effusion; Nose: Nose normal. Mouth/Throat: Oropharynx is clear and moist, mod erythema. No oropharyngeal exudate. no angioedema of lips/tongue/OP - Eyes: Conjunctivae and EOM are normal. Pupils are equal, round, and reactive to light. No scleral icterus.  Neck: Normal range of motion. Neck supple. No JVD or LAD present. No thyromegaly present.  Cardiovascular: Normal rate, regular rhythm and normal heart sounds.  No murmur heard. No BLE edema. Pulmonary/Chest: Effort  normal and breath sounds clear. No respiratory distress. She has no wheezes.  Skin: few hives on BUE, neck anterior chest and back - face and front torso spared at this time - Skin is warm and dry. No confluent or diffuse rash noted. No erythema.  Psychiatric: She has a normal mood and affect. Her behavior is normal. Judgment and thought content normal.      Lab Results  Component Value Date   WBC 4.8 01/31/2012   HGB 15.4* 01/31/2012   HCT 44.9 01/31/2012   PLT 225.0 01/31/2012   CHOL 185 09/10/2009   TRIG 101.0 09/10/2009   HDL 36.40* 09/10/2009   ALT 18 09/19/2011   AST 21 09/19/2011   NA 143 09/19/2011   K 4.3 09/19/2011   CL 108 09/19/2011   CREATININE 0.7 09/19/2011   BUN 10 09/19/2011   CO2 29 09/19/2011   TSH 0.79 01/14/2010   HGBA1C 6.0 08/21/2008     Assessment & Plan:   Hives, cyclical - etiology unknown -  Acute onset early 01/2012 - denies hx same previously had temporary relief with benadryl but less relief now Temporary relief following IM Medrol on 9/3, but recurrent symptoms within 24 hours following steroids Reviewed results of allergy panel for environmental and food allergies from 9/3 testing> multiple 12d pred taper now Use zyrtec daily in addition to benadryl prn (or other antihistamine) Prescription for EpiPen provided in case of shortness of breath symptoms refer to allergy pending - appt 02/2012 Work excuse provided  as recurrent itching has kept patient from job since last office visit

## 2012-03-10 ENCOUNTER — Observation Stay (HOSPITAL_COMMUNITY)
Admission: EM | Admit: 2012-03-10 | Discharge: 2012-03-10 | Disposition: A | Discharge status: Home Health | Payer: Managed Care, Other (non HMO) | Attending: Emergency Medicine | Admitting: Emergency Medicine

## 2012-03-10 ENCOUNTER — Encounter (HOSPITAL_COMMUNITY): Payer: Self-pay | Admitting: Emergency Medicine

## 2012-03-10 DIAGNOSIS — T7840XA Allergy, unspecified, initial encounter: Secondary | ICD-10-CM

## 2012-03-10 DIAGNOSIS — Z8611 Personal history of tuberculosis: Secondary | ICD-10-CM | POA: Insufficient documentation

## 2012-03-10 DIAGNOSIS — L254 Unspecified contact dermatitis due to food in contact with skin: Principal | ICD-10-CM | POA: Insufficient documentation

## 2012-03-10 DIAGNOSIS — E785 Hyperlipidemia, unspecified: Secondary | ICD-10-CM | POA: Insufficient documentation

## 2012-03-10 DIAGNOSIS — D649 Anemia, unspecified: Secondary | ICD-10-CM | POA: Insufficient documentation

## 2012-03-10 DIAGNOSIS — G589 Mononeuropathy, unspecified: Secondary | ICD-10-CM | POA: Insufficient documentation

## 2012-03-10 DIAGNOSIS — K219 Gastro-esophageal reflux disease without esophagitis: Secondary | ICD-10-CM | POA: Insufficient documentation

## 2012-03-10 MED ORDER — DIPHENHYDRAMINE HCL 25 MG PO TABS: 25.0000 mg | ORAL_TABLET | Freq: Four times a day (QID) | ORAL | Status: DC

## 2012-03-10 MED ORDER — EPINEPHRINE 0.3 MG/0.3ML IJ DEVI: INTRAMUSCULAR | Status: DC

## 2012-03-10 MED ORDER — RANITIDINE HCL 150 MG PO CAPS: 150.0000 mg | ORAL_CAPSULE | Freq: Every day | ORAL | Status: DC

## 2012-03-10 NOTE — ED Provider Notes (Addendum)
History     CSN: [id number]  Arrival date & time 03/10/12  1634   First MD Initiated Contact with Patient 03/10/12 1654      Chief Complaint  Patient presents with  . Allergic Reaction    (Consider location/radiation/quality/duration/timing/severity/associated sxs/prior treatment) HPI Patient was in the same room with some peanuts at 3:30 PM today when she developed a scratchy throat and feeling as if her throat was closing accompanied by hoarseness. She was treated with EpiPen Benadryl Zantac and Solu-Medrol with partial relief she feels almost at baseline voice is still slightly hoarse no other complaint. She recently learned of an allergy to peanuts. No other associated symptoms. Past Medical History  Diagnosis Date  . ANEMIA-NOS   . GERD   . HYPERLIPIDEMIA, WITH LOW HDL   . POSITIVE PPD 1987    s/p 8mo tx  . MIGRAINE HEADACHE   . PERIPHERAL NEUROPATHY     Past Surgical History  Procedure Date  . Appendectomy 1992  . Abdominal hysterectomy 1996  . Cesarean section 1989  . Cholecystectomy 04/22/2011    Procedure: LAPAROSCOPIC CHOLECYSTECTOMY WITH INTRAOPERATIVE CHOLANGIOGRAM;  Surgeon: Almond Lint, MD;  Location: [facility] OR;  Service: General;  Laterality: N/A;    Family History  Problem Relation Age of Onset  . Diabetes Mother   . Prostate cancer Father   . Hypertension Father   . Heart disease Father   . Diabetes Brother   . Alcohol abuse Other   . Arthritis Other   . Hypertension Other     History  Substance Use Topics  . Smoking status: Never Smoker   . Smokeless tobacco: Never Used  . Alcohol Use: No    OB History    Grav Para Term Preterm Abortions TAB SAB Ect Mult Living                  Review of Systems  Unable to perform ROS HENT: Positive for voice change.        Hoarseness  All other systems reviewed and are negative.    Allergies  Morphine  Home Medications   Current Outpatient Rx  Name Route Sig Dispense Refill  . CETIRIZINE  HCL 10 MG PO TABS Oral Take 1 tablet (10 mg total) by mouth daily. 30 tablet 11  . CHOLECALCIFEROL 1000 UNITS PO CAPS Oral Take 1,000 Units by mouth daily.    Marland Kitchen EPINEPHRINE 0.3 MG/0.3ML IJ DEVI Intramuscular Inject 0.3 mLs (0.3 mg total) into the muscle once. 1 Device 0  . PANTOPRAZOLE SODIUM 40 MG PO TBEC Oral Take 1 tablet (40 mg total) by mouth daily. 30 tablet 11  . VITAMIN B-6 100 MG PO TABS Oral Take 100 mg by mouth daily.      . TRAVOPROST 0.004 % OP SOLN Both Eyes Place 1 drop into both eyes at bedtime.        BP 127/85  Temp 98.9 F (37.2 C) (Oral)  Resp 18  SpO2 99%  Physical Exam  Nursing note and vitals reviewed. Constitutional: She appears well-developed and well-nourished.  HENT:  Head: Normocephalic and atraumatic.  Eyes: Conjunctivae normal are normal. Pupils are equal, round, and reactive to light.  Neck: Neck supple. No tracheal deviation present. No thyromegaly present.  Cardiovascular: Normal rate and regular rhythm.   No murmur heard. Pulmonary/Chest: Effort normal and breath sounds normal.  Abdominal: Soft. Bowel sounds are normal. She exhibits no distension. There is no tenderness.       Obese  Musculoskeletal:  Normal range of motion. She exhibits no edema and no tenderness.  Neurological: She is alert. Coordination normal.  Skin: Skin is warm and dry. No rash noted.  Psychiatric: She has a normal mood and affect.    ED Course  Procedures (including critical care time)  Labs Reviewed - No data to display No results found.   No diagnosis found.    Date: 03/10/2012  Rate: 80  Rhythm: normal sinus rhythm  QRS Axis: normal  Intervals: normal  ST/T Wave abnormalities: normal  Conduction Disutrbances: none  Narrative Interpretation: unremarkable Unchanged from November22,2012 interprted by me   MDM  To CDU for continued observation until approximately 8 PM, monitor hemodynamic and clinical status. Patient reports she has another EpiPen at  home Diagnosis allergic reaction to peanuts        Doug Sou, MD 03/10/12 1708  Doug Sou, MD 03/11/12 1610

## 2012-03-10 NOTE — ED Notes (Signed)
Pt arrived by EMS. Pt has a recent diagnosis of allergy to peanuts. EMS stated pt was at [facility] when she smelled peanuts and stated to EMS that she though her throat felt like it was closing up. Benadryl 50mg , Zantac 50mg , Solu-medrol 125mg , and self administered her own Epi pen. Pt also had a bolus of NS. EMS stated lungs have been clear the whole time. Pt stated that her throat still feels scratchy. VS:  BP-126/72 HR-84 O2sat 100% 2LPM.

## 2012-03-10 NOTE — ED Notes (Signed)
Pt sleeping, family at bedside. Pt states he is feeling better

## 2012-03-10 NOTE — ED Provider Notes (Signed)
1700 report received from Dr. Rennis Chris. Patient will come to CDU on allergic reaction protocol. Patient allergic reaction to peanuts around 3:30 with itching and felt like her throat was closing up/scratchy.  Patient has had Benadryl, Zantac, Solu-Medrol,nd an EpiPen to 250 cc bolus in route via EMS. No acute distress noted presently.  2020  9 further alert and reaction symptoms. No throat swelling presently. No wheezing. Patient will be discharged and continue to take Benadryl Zantac. Rx for EpiPen given to the patient. Patient and husband understand discharge instructions and are ready for discharge. They also understand to return for wheezing, shortness of breath, or if she felt like her throat is closing up. Patient will follow up with Dr. let her on Monday.    Remi Haggard, NP 03/10/12 2357

## 2012-03-11 NOTE — ED Provider Notes (Signed)
Medical screening examination/treatment/procedure(s) were conducted as a shared visit with non-physician practitioner(s) and myself.  I personally evaluated the patient during the encounter  Chastin Garlitz, MD 03/11/12 0025 

## 2012-03-12 ENCOUNTER — Telehealth: Payer: Self-pay | Admitting: Internal Medicine

## 2012-03-12 ENCOUNTER — Ambulatory Visit: Payer: Managed Care, Other (non HMO) | Admitting: Endocrinology

## 2012-03-12 DIAGNOSIS — Z0289 Encounter for other administrative examinations: Secondary | ICD-10-CM

## 2012-03-12 NOTE — Telephone Encounter (Signed)
Megan Wilkerson calling re: 10/12 allergic reaction to peanuts.  Used Epi pen and was sent to ED for anaphylactic reaction. Was discharged and sent home with insructions to take Benadryl.  Has been taking 1 tab every "few hours" .  Has not taken any today.  States she has hoarseness slight tightness in her chest and some mild itching.  Care advice per Allergic Reaction Severe, advised Benadryl 2 tabs po q 6 hrs until seen.  Apt 10/15 at 1515 with Dr. Everardo All.   Triage performed by Sudie Grumbling, RN/CAN

## 2012-03-13 ENCOUNTER — Ambulatory Visit (INDEPENDENT_AMBULATORY_CARE_PROVIDER_SITE_OTHER): Payer: Managed Care, Other (non HMO) | Admitting: Internal Medicine

## 2012-03-13 ENCOUNTER — Encounter: Payer: Self-pay | Admitting: Internal Medicine

## 2012-03-13 VITALS — BP 110/70 | HR 74 | Temp 97.1°F | Ht 66.0 in | Wt 211.5 lb

## 2012-03-13 DIAGNOSIS — T7840XA Allergy, unspecified, initial encounter: Secondary | ICD-10-CM | POA: Insufficient documentation

## 2012-03-13 MED ORDER — PREDNISONE 10 MG PO TABS: 10.0000 mg | ORAL_TABLET | Freq: Every day | ORAL | Status: DC

## 2012-03-13 MED ORDER — METHYLPREDNISOLONE ACETATE 80 MG/ML IJ SUSP
120.0000 mg | Freq: Once | INTRAMUSCULAR | Status: AC
  Administered 2012-03-13: 120 mg via INTRAMUSCULAR

## 2012-03-13 NOTE — Patient Instructions (Addendum)
You had the steroid shot today Take all new medications as prescribed Continue all other medications as before, including the benadryl OTC Thank you for enrolling in MyChart. Please follow the instructions below to securely access your online medical record. MyChart allows you to send messages to your doctor, view your test results, renew your prescriptions, schedule appointments, and more.   Remember your username is [email or web address]

## 2012-03-13 NOTE — Assessment & Plan Note (Signed)
With mild persistent symptoms, for depomedrol IM, predpack asd, cont benadryl otc prn, has epipen refills, cont to avoid further peanut
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
## 2012-03-16 ENCOUNTER — Encounter: Payer: Self-pay | Admitting: Internal Medicine

## 2012-03-16 ENCOUNTER — Ambulatory Visit (INDEPENDENT_AMBULATORY_CARE_PROVIDER_SITE_OTHER): Payer: Managed Care, Other (non HMO) | Admitting: Internal Medicine

## 2012-03-16 VITALS — BP 118/80 | HR 88 | Ht 66.0 in | Wt 214.0 lb

## 2012-03-16 DIAGNOSIS — Z91010 Allergy to peanuts: Secondary | ICD-10-CM

## 2012-03-16 DIAGNOSIS — K219 Gastro-esophageal reflux disease without esophagitis: Secondary | ICD-10-CM
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
## 2012-03-16 NOTE — Patient Instructions (Addendum)
Strictly avoid any exposure to peanuts  Keep epipen available  Daily antihistamine- suggest loratadine/ Claritin or fexofenadine/ Allegra, since these are not sedating  You can try gradually adding back the other foods on your list, as tolerated.   It may help to keep a food diary of when and what you eat, so you can record if you have a reaction

## 2012-03-20 ENCOUNTER — Inpatient Hospital Stay (HOSPITAL_COMMUNITY): Payer: Managed Care, Other (non HMO)

## 2012-03-20 ENCOUNTER — Inpatient Hospital Stay (HOSPITAL_COMMUNITY)
Admission: AD | Admit: 2012-03-20 | Discharge: 2012-03-21 | Disposition: A | Discharge status: Home / Self Care | Payer: Managed Care, Other (non HMO) | Source: Ambulatory Visit | Attending: Obstetrics & Gynecology | Admitting: Obstetrics & Gynecology

## 2012-03-20 DIAGNOSIS — S01501A Unspecified open wound of lip, initial encounter: Secondary | ICD-10-CM | POA: Insufficient documentation

## 2012-03-20 DIAGNOSIS — S0990XA Unspecified injury of head, initial encounter: Secondary | ICD-10-CM

## 2012-03-20 DIAGNOSIS — S01512A Laceration without foreign body of oral cavity, initial encounter: Secondary | ICD-10-CM

## 2012-03-20 DIAGNOSIS — Y9210 Unspecified residential institution as the place of occurrence of the external cause: Secondary | ICD-10-CM | POA: Insufficient documentation

## 2012-03-20 DIAGNOSIS — R51 Headache: Secondary | ICD-10-CM | POA: Insufficient documentation

## 2012-03-20 DIAGNOSIS — R030 Elevated blood-pressure reading, without diagnosis of hypertension: Secondary | ICD-10-CM | POA: Insufficient documentation

## 2012-03-20 DIAGNOSIS — T07XXXA Unspecified multiple injuries, initial encounter: Secondary | ICD-10-CM

## 2012-03-20 MED ORDER — TETANUS-DIPHTH-ACELL PERTUSSIS 5-2.5-18.5 LF-MCG/0.5 IM SUSP
0.5000 mL | Freq: Once | INTRAMUSCULAR | Status: AC
  Administered 2012-03-21: 0.5 mL via INTRAMUSCULAR
  Filled 2012-03-20: qty 0.5
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
## 2012-03-21 ENCOUNTER — Encounter (HOSPITAL_COMMUNITY): Payer: Self-pay | Admitting: *Deleted

## 2012-03-21 MED ORDER — SULFAMETHOXAZOLE-TRIMETHOPRIM 800-160 MG PO TABS: 1.0000 | ORAL_TABLET | Freq: Two times a day (BID) | ORAL | Status: DC

## 2012-03-21 MED ORDER — HYDROCODONE-ACETAMINOPHEN 5-325 MG PO TABS: 2.0000 | ORAL_TABLET | ORAL | Status: DC | PRN

## 2012-03-21 MED ORDER — SULFAMETHOXAZOLE-TMP DS 800-160 MG PO TABS
1.0000 | ORAL_TABLET | Freq: Once | ORAL | Status: AC
  Administered 2012-03-21: 1 via ORAL
  Filled 2012-03-21: qty 1

## 2012-03-21 NOTE — MAU Provider Note (Signed)
Attestation of Attending Supervision of Advanced Practitioner (CNM/NP): Evaluation and management procedures were performed by the Advanced Practitioner under my supervision and collaboration.  I have reviewed the Advanced Practitioner's note and chart, and I agree with the management and plan.  Ifrah Vest, MD, FACOG Attending Obstetrician & Gynecologist Faculty Practice, [facility]  

## 2012-03-25 DIAGNOSIS — T7800XA Anaphylactic reaction due to unspecified food, initial encounter: Secondary | ICD-10-CM | POA: Insufficient documentation

## 2012-03-25 NOTE — Assessment & Plan Note (Signed)
She has significant IgE elevation against a broad variety of potential allergens. That does not sincerely mean exposure to each of these would trigger symptoms. We're most sure of sensitivity to peanut, which must be strictly avoided. Plan-suggest daily loratadine. Voice rest with suspicion that reflux has contributed to her hoarseness, this long after the key event. Keep EpiPen. Maintain food diary with particular attention to those foods with elevated IgE antibody levels identified September 3

## 2012-03-25 NOTE — Assessment & Plan Note (Signed)
GERD would be more likely to cause persistent hoarseness than a single allergic event subsequently treated with antihistamines and steroids.

## 2012-04-18 IMAGING — CR DG CHEST 2V
2 series · 2 of 2 positions shown · non-contrast
Comparison: 12/16/2010

CLINICAL DATA: Follow up right upper lobe pneumonia

CHEST - 2 VIEW

[view not recorded (1 of 2)]
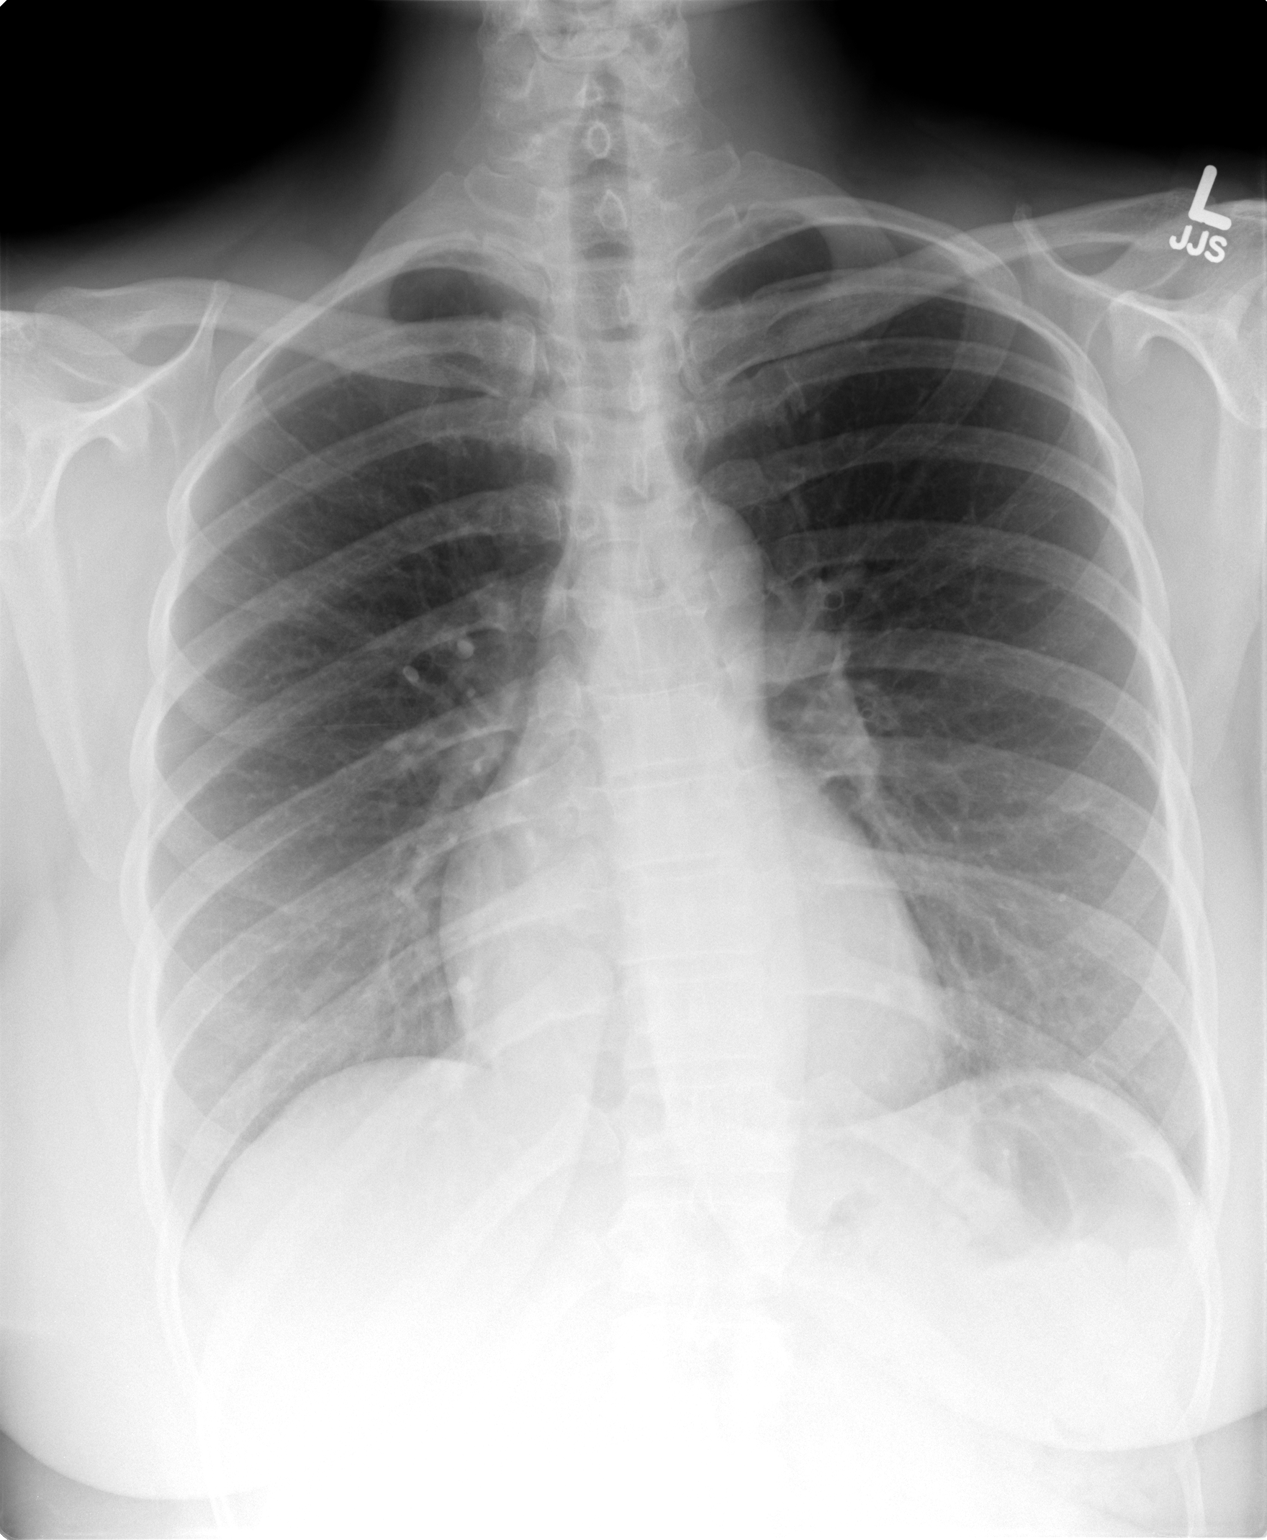

[view not recorded (2 of 2)]
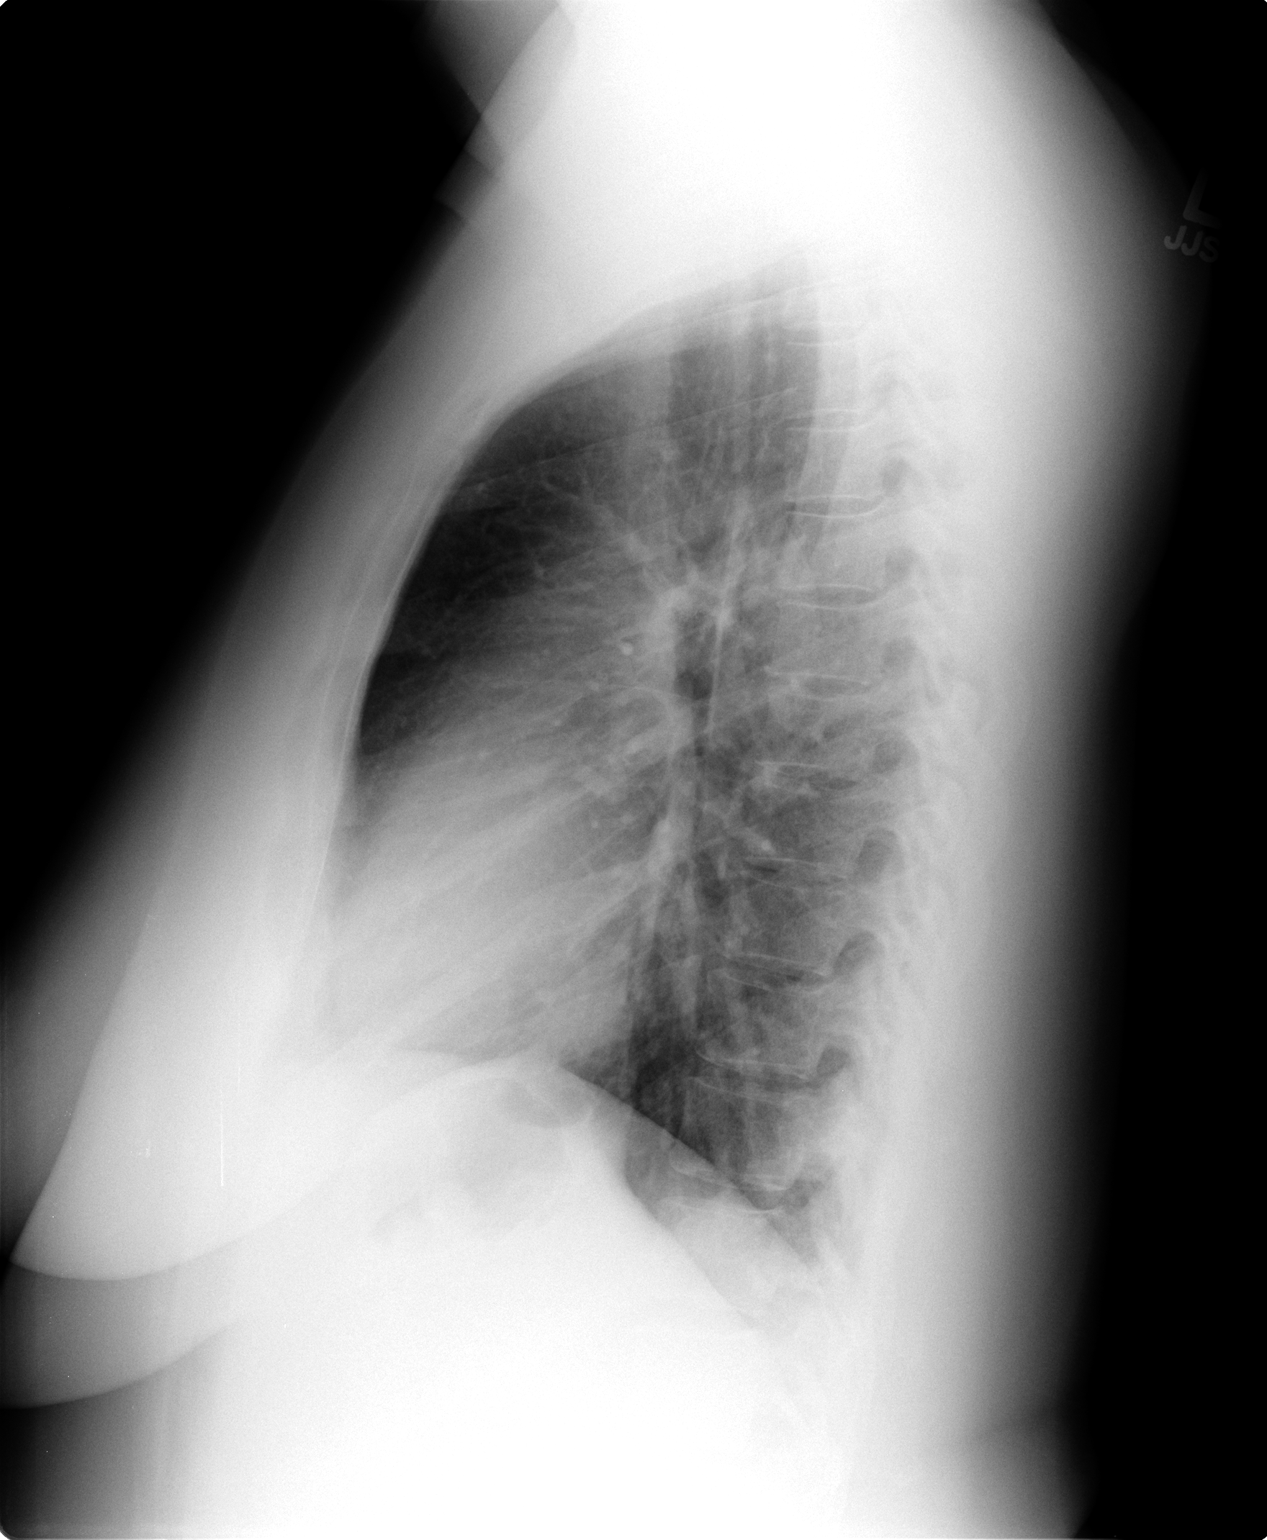

[2 of 2 positions shown; findings below may reference images not displayed]

FINDINGS: The heart size and mediastinal contours are within normal
limits.  Both lungs are clear.  The visualized skeletal structures
are unremarkable.
IMPRESSION: No active cardiopulmonary disease.

Resolution of previous right upper lobe pneumonia.

## 2012-05-07 ENCOUNTER — Emergency Department (HOSPITAL_COMMUNITY)
Admission: EM | Admit: 2012-05-07 | Discharge: 2012-05-07 | Disposition: A | Discharge status: Home / Self Care | Payer: Managed Care, Other (non HMO) | Attending: Emergency Medicine | Admitting: Emergency Medicine

## 2012-05-07 ENCOUNTER — Encounter (HOSPITAL_COMMUNITY): Payer: Self-pay | Admitting: Emergency Medicine

## 2012-05-07 DIAGNOSIS — K219 Gastro-esophageal reflux disease without esophagitis: Secondary | ICD-10-CM | POA: Insufficient documentation

## 2012-05-07 DIAGNOSIS — Z888 Allergy status to other drugs, medicaments and biological substances status: Secondary | ICD-10-CM | POA: Insufficient documentation

## 2012-05-07 DIAGNOSIS — E785 Hyperlipidemia, unspecified: Secondary | ICD-10-CM | POA: Insufficient documentation

## 2012-05-07 DIAGNOSIS — R062 Wheezing: Secondary | ICD-10-CM | POA: Insufficient documentation

## 2012-05-07 DIAGNOSIS — R0602 Shortness of breath: Secondary | ICD-10-CM | POA: Insufficient documentation

## 2012-05-07 DIAGNOSIS — L272 Dermatitis due to ingested food: Secondary | ICD-10-CM | POA: Insufficient documentation

## 2012-05-07 DIAGNOSIS — Z91010 Allergy to peanuts: Secondary | ICD-10-CM

## 2012-05-07 DIAGNOSIS — Y939 Activity, unspecified: Secondary | ICD-10-CM | POA: Insufficient documentation

## 2012-05-07 DIAGNOSIS — Z8679 Personal history of other diseases of the circulatory system: Secondary | ICD-10-CM | POA: Insufficient documentation

## 2012-05-07 DIAGNOSIS — Y929 Unspecified place or not applicable: Secondary | ICD-10-CM | POA: Insufficient documentation

## 2012-05-07 DIAGNOSIS — Z862 Personal history of diseases of the blood and blood-forming organs and certain disorders involving the immune mechanism: Secondary | ICD-10-CM | POA: Insufficient documentation

## 2012-05-07 DIAGNOSIS — R0789 Other chest pain: Secondary | ICD-10-CM | POA: Insufficient documentation

## 2012-05-07 DIAGNOSIS — Z8669 Personal history of other diseases of the nervous system and sense organs: Secondary | ICD-10-CM | POA: Insufficient documentation

## 2012-05-07 DIAGNOSIS — Z79899 Other long term (current) drug therapy: Secondary | ICD-10-CM | POA: Insufficient documentation

## 2012-05-07 DIAGNOSIS — T628X1A Toxic effect of other specified noxious substances eaten as food, accidental (unintentional), initial encounter: Secondary | ICD-10-CM | POA: Insufficient documentation

## 2012-05-07 MED ORDER — PREDNISONE 10 MG PO TABS: 20.0000 mg | ORAL_TABLET | Freq: Every day | ORAL | Status: DC

## 2012-05-07 MED ORDER — EPINEPHRINE 0.3 MG/0.3ML IJ DEVI: 0.3000 mg | INTRAMUSCULAR | Status: DC | PRN

## 2012-05-07 MED ORDER — SODIUM CHLORIDE 0.9 % IV BOLUS (SEPSIS)
500.0000 mL | Freq: Once | INTRAVENOUS | Status: AC
  Administered 2012-05-07: 500 mL via INTRAVENOUS

## 2012-05-07 MED ORDER — FAMOTIDINE 20 MG PO TABS: 20.0000 mg | ORAL_TABLET | Freq: Two times a day (BID) | ORAL | Status: DC

## 2012-05-07 NOTE — ED Provider Notes (Signed)
Medical screening examination/treatment/procedure(s) were performed by non-physician practitioner and as supervising physician I was immediately available for consultation/collaboration.  Flint Melter, MD 05/07/12 [phone]

## 2012-05-07 NOTE — ED Notes (Signed)
Per EMS pt has allergy to peanuts and inhaled peanuts today. Pt felt her throat closing and gave herself epi pen approximately 1515. Pt states she normally does not experience itching before respiratory symptoms, same today. Pt received 50 mg benedryl IV, 50 mg zantac IV, 5 mg albuterol via neb, 125 mg solumedrol IV, 4 mg zofran IV.

## 2012-05-07 NOTE — ED Notes (Signed)
Pt comfortable, NAD at this time. Airway clear. Continues to speak with hoarse voice.

## 2012-05-07 NOTE — ED Provider Notes (Signed)
History     CSN: [id number]  Arrival date & time 05/07/12  1611   First MD Initiated Contact with Patient 05/07/12 1613      No chief complaint on file.   (Consider location/radiation/quality/duration/timing/severity/associated sxs/prior treatment) HPI  46 year old female with extensive hx of allergies (peanut, corns, tomatos, soy, etc) presents for evaluation of allergic reaction.  Patient reports while at work today she was talking to a coworker who was eating peanuts. Incident happened 2 hours ago. Shortly afterward patient felt her throat was closing, her voice hoarse, and was having trouble taking deep breath or speaking. She denies having hives, or itch. Denies fever, chills, sneezing, runny nose, chest pain, abdominal pain, nausea, vomiting, diarrhea, or rash. She injected herself with an EpiPen and called EMS. On route she received 50 mg of Benadryl IV, 50 mg of Zantac IV, 5 mg albuterol via nebs, 125 mg of Solu-Medrol IV, and oral milligrams of Zofran IV. Currently patient felt much better after receiving treatment. She reports a symptom is similar to her prior peanut allergy. She denies any other environmental changes.  No prior hx of allergy requiring intubation for admission.   Past Medical History  Diagnosis Date  . ANEMIA-NOS   . GERD   . HYPERLIPIDEMIA, WITH LOW HDL   . POSITIVE PPD 1987    s/p 44mo tx  . MIGRAINE HEADACHE   . PERIPHERAL NEUROPATHY     Past Surgical History  Procedure Date  . Appendectomy 1992  . Abdominal hysterectomy 1996  . Cesarean section 1989  . Cholecystectomy 04/22/2011    Procedure: LAPAROSCOPIC CHOLECYSTECTOMY WITH INTRAOPERATIVE CHOLANGIOGRAM;  Surgeon: Almond Lint, MD;  Location: [facility] OR;  Service: General;  Laterality: N/A;    Family History  Problem Relation Age of Onset  . Diabetes Mother   . Prostate cancer Father   . Hypertension Father   . Heart disease Father   . Diabetes Brother   . Alcohol abuse Other   . Arthritis Other    . Hypertension Other     History  Substance Use Topics  . Smoking status: Never Smoker   . Smokeless tobacco: Never Used  . Alcohol Use: No    OB History    Grav Para Term Preterm Abortions TAB SAB Ect Mult Living   5 5        5       Review of Systems  Constitutional: Negative for fever.  HENT: Negative for drooling.   Respiratory: Positive for chest tightness, shortness of breath and wheezing.   Cardiovascular: Negative for chest pain.  Gastrointestinal: Negative for abdominal pain.  Skin: Negative for rash.  Neurological: Negative for numbness and headaches.  All other systems reviewed and are negative.    Allergies  Corn dextrin; Peanuts; Soy allergy; Tomato; Wheat bran; and Morphine  Home Medications   Current Outpatient Rx  Name  Route  Sig  Dispense  Refill  . CETIRIZINE HCL 10 MG PO TABS   Oral   Take 1 tablet (10 mg total) by mouth daily.   30 tablet   11   . CHOLECALCIFEROL 1000 UNITS PO CAPS   Oral   Take 1,000 Units by mouth daily.         Marland Kitchen DIPHENHYDRAMINE HCL 25 MG PO TABS   Oral   Take 1 tablet (25 mg total) by mouth every 6 (six) hours.   20 tablet   0   . EPINEPHRINE 0.3 MG/0.3ML IJ DEVI  Use as directed for allergic reaction   1 Device   0     Dispense as written.   Marland Kitchen HYDROCODONE-ACETAMINOPHEN 5-325 MG PO TABS   Oral   Take 2 tablets by mouth every 4 (four) hours as needed for pain.   10 tablet   0   . PANTOPRAZOLE SODIUM 40 MG PO TBEC   Oral   Take 40 mg by mouth daily.         Marland Kitchen PREDNISONE 10 MG PO TABS   Oral   Take 1 tablet (10 mg total) by mouth daily. 3 tabs by mouth per day for 3 days, then 2 tabs per day for 3 days, then 1 tab per day for 3 days, then stop   18 tablet   0   . VITAMIN B-6 100 MG PO TABS   Oral   Take 100 mg by mouth daily.          Marland Kitchen RANITIDINE HCL 150 MG PO CAPS   Oral   Take 1 capsule (150 mg total) by mouth daily.   30 capsule   0   . SULFAMETHOXAZOLE-TRIMETHOPRIM 800-160 MG PO  TABS   Oral   Take 1 tablet by mouth 2 (two) times daily.   14 tablet   0   . TRAVOPROST 0.004 % OP SOLN   Both Eyes   Place 1 drop into both eyes at bedtime.            There were no vitals taken for this visit.  Physical Exam  Nursing note and vitals reviewed. Constitutional: She is oriented to person, place, and time. She appears well-developed and well-nourished. No distress.       Awake, alert, nontoxic appearance  HENT:  Head: Atraumatic.  Mouth/Throat: Oropharynx is clear and moist.       Voice is hoarse otherwise no obvious airway compromise. No tongue edema, no oral mucosal edema.  Eyes: Conjunctivae normal are normal. Right eye exhibits no discharge. Left eye exhibits no discharge.  Neck: Normal range of motion. Neck supple.  Cardiovascular: Normal rate and regular rhythm.   Pulmonary/Chest: Effort normal. No stridor. No respiratory distress. She has no wheezes. She exhibits no tenderness.       Lungs clear on exam  Abdominal: Soft. There is no tenderness. There is no rebound.  Musculoskeletal: She exhibits no edema and no tenderness.       ROM appears intact, no obvious focal weakness  Neurological: She is alert and oriented to person, place, and time.       Mental status and motor strength appears intact  Skin: No rash noted.  Psychiatric: She has a normal mood and affect.    ED Course  Procedures (including critical care time)  Labs Reviewed - No data to display No results found.   No diagnosis found.  1. Allergic reaction to peanut  MDM  Pt with hx of peanut allergy has exposure to peanut while talking to a coworker that was eating peanut.  She felt better after receiving treatment via EMS.  She is currently in NAD.  Will continue to monitor.  Will refill her epipen prescription.    5:44 PM Patient symptom has improved while in the ED. She is satting 98% on room air.  She is able to tolerates by mouth. She is stable to be discharged after IVF.  Will  monitor for 3 hrs.  Care discussed with my attending.    BP 131/82  Pulse 105  Temp 98.4 F (36.9 C) (Oral)  Resp 19  SpO2 98%   7:23 PM Pt has no worsening of her sxs after being monitored in ED for >3hrs. No hypotension. Pt stable to be discharged.    BP 100/61  Pulse 92  Temp 98.7 F (37.1 C) (Oral)  Resp 17  SpO2 96%   Fayrene Helper, PA-C 05/07/12 1924

## 2012-05-07 NOTE — ED Notes (Signed)
NAD noted at this time. Pt voice is hoarse and throat appears swollen. Pt able to speak in short sentences. Pt states it is painful to swallow. Pt has dry cough with each time she takes deep breath or speaks.

## 2012-05-08 ENCOUNTER — Telehealth: Payer: Self-pay | Admitting: Internal Medicine

## 2012-05-08 NOTE — Telephone Encounter (Signed)
Spoke with pt. She states that she was speaking with a co worker yesterday and did not know that she was eating peanuts, and then had allergic reaction and had to go to ED and use her epi pen. She states feeling some better today, and wants to know if she should keep f/u with [facility]. I advised that yes, she should keep appt. She verbalized understanding, and states that nothing further needed.

## 2012-05-09 ENCOUNTER — Encounter: Payer: Self-pay | Admitting: Internal Medicine

## 2012-05-09 ENCOUNTER — Ambulatory Visit (INDEPENDENT_AMBULATORY_CARE_PROVIDER_SITE_OTHER): Payer: Managed Care, Other (non HMO) | Admitting: Internal Medicine

## 2012-05-09 ENCOUNTER — Ambulatory Visit: Payer: Managed Care, Other (non HMO) | Admitting: Internal Medicine

## 2012-05-09 VITALS — BP 126/88 | HR 96 | Ht 66.0 in | Wt 218.4 lb

## 2012-05-09 DIAGNOSIS — R05 Cough: Secondary | ICD-10-CM

## 2012-05-09 DIAGNOSIS — Z91010 Allergy to peanuts: Secondary | ICD-10-CM
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
## 2012-05-09 NOTE — Patient Instructions (Addendum)
Continue to carry the Epipen  Daily antihistamine. You could also try allegra, which is not usually sedating.  Please call as needed

## 2012-05-17 NOTE — Assessment & Plan Note (Signed)
She only assumes that the friend had been eating peanuts and isn't really sure what smell was associated with her latest flare. This isn't clearly asthma, but borderline. Plan-carry EpiPen. Daily antihistamine. If the asthma component becomes more obvious than she may be a candidate for Xolair

## 2012-06-22 ENCOUNTER — Emergency Department (HOSPITAL_COMMUNITY)
Admission: EM | Admit: 2012-06-22 | Discharge: 2012-06-22 | Disposition: A | Discharge status: Home / Self Care | Payer: Managed Care, Other (non HMO) | Attending: Emergency Medicine | Admitting: Emergency Medicine

## 2012-06-22 ENCOUNTER — Encounter (HOSPITAL_COMMUNITY): Payer: Self-pay

## 2012-06-22 DIAGNOSIS — Y929 Unspecified place or not applicable: Secondary | ICD-10-CM | POA: Insufficient documentation

## 2012-06-22 DIAGNOSIS — J029 Acute pharyngitis, unspecified: Secondary | ICD-10-CM | POA: Insufficient documentation

## 2012-06-22 DIAGNOSIS — Z8669 Personal history of other diseases of the nervous system and sense organs: Secondary | ICD-10-CM | POA: Insufficient documentation

## 2012-06-22 DIAGNOSIS — T628X1A Toxic effect of other specified noxious substances eaten as food, accidental (unintentional), initial encounter: Secondary | ICD-10-CM | POA: Insufficient documentation

## 2012-06-22 DIAGNOSIS — R0609 Other forms of dyspnea: Secondary | ICD-10-CM | POA: Insufficient documentation

## 2012-06-22 DIAGNOSIS — R0989 Other specified symptoms and signs involving the circulatory and respiratory systems: Secondary | ICD-10-CM | POA: Insufficient documentation

## 2012-06-22 DIAGNOSIS — Y9389 Activity, other specified: Secondary | ICD-10-CM | POA: Insufficient documentation

## 2012-06-22 DIAGNOSIS — Z8639 Personal history of other endocrine, nutritional and metabolic disease: Secondary | ICD-10-CM | POA: Insufficient documentation

## 2012-06-22 DIAGNOSIS — Z79899 Other long term (current) drug therapy: Secondary | ICD-10-CM | POA: Insufficient documentation

## 2012-06-22 DIAGNOSIS — T7840XA Allergy, unspecified, initial encounter: Secondary | ICD-10-CM

## 2012-06-22 DIAGNOSIS — Z8719 Personal history of other diseases of the digestive system: Secondary | ICD-10-CM | POA: Insufficient documentation

## 2012-06-22 DIAGNOSIS — Z8679 Personal history of other diseases of the circulatory system: Secondary | ICD-10-CM | POA: Insufficient documentation

## 2012-06-22 DIAGNOSIS — Z862 Personal history of diseases of the blood and blood-forming organs and certain disorders involving the immune mechanism: Secondary | ICD-10-CM | POA: Insufficient documentation

## 2012-06-22 DIAGNOSIS — D649 Anemia, unspecified: Secondary | ICD-10-CM | POA: Insufficient documentation

## 2012-06-22 MED ORDER — HYDROXYZINE HCL 25 MG PO TABS
25.0000 mg | ORAL_TABLET | Freq: Once | ORAL | Status: AC
  Administered 2012-06-22: 25 mg via ORAL
  Filled 2012-06-22: qty 1

## 2012-06-22 MED ORDER — DIPHENHYDRAMINE HCL 25 MG PO CAPS: 25.0000 mg | ORAL_CAPSULE | Freq: Three times a day (TID) | ORAL | Status: DC

## 2012-06-22 MED ORDER — EPINEPHRINE 0.3 MG/0.3ML IJ DEVI: 0.3000 mg | INTRAMUSCULAR | Status: DC | PRN

## 2012-06-22 MED ORDER — LIDOCAINE VISCOUS 2 % MT SOLN
20.0000 mL | Freq: Once | OROMUCOSAL | Status: AC
  Administered 2012-06-22: 20 mL via OROMUCOSAL
  Filled 2012-06-22: qty 15

## 2012-06-22 MED ORDER — FAMOTIDINE 20 MG PO TABS: 20.0000 mg | ORAL_TABLET | Freq: Two times a day (BID) | ORAL | Status: DC

## 2012-06-22 MED ORDER — DIPHENHYDRAMINE HCL 50 MG/ML IJ SOLN
25.0000 mg | Freq: Once | INTRAMUSCULAR | Status: AC
  Administered 2012-06-22: 25 mg via INTRAVENOUS
  Filled 2012-06-22: qty 1

## 2012-06-22 NOTE — ED Notes (Addendum)
Pt ate cranberries today, after eating developed "an itchy throat." Pt states he throat won't stop itching. ABCs intact. NAd noted. Pt states none of the medication has helped her. Pt speaking in full sentences, clears throat often.

## 2012-06-22 NOTE — ED Provider Notes (Signed)
History     CSN: [id number]  Arrival date & time 06/22/12  1815   First MD Initiated Contact with Patient 06/22/12 1821      Chief Complaint  Patient presents with  . Allergic Reaction    HPI  The patient presents approximately one hour after the sudden development of dyspnea and sore throat.  She states that the onset was sudden.  Immediately after the onset of symptoms she took Benadryl, and EMS gave her Solu-Medrol and Zantac in route.  She states that her symptoms resolved entirely aside from scratchy throat.  She currently has no complaints of dyspnea, chest pain, lightheadedness, fever, chills, nausea. She states that she is one similar episode, but no history of intubation, nor admission for this. She states that she may have ingested a substance that contained sunflower oil, to which she is allergic.    Past Medical History  Diagnosis Date  . ANEMIA-NOS   . GERD   . HYPERLIPIDEMIA, WITH LOW HDL   . 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 MIGRAINE HEADACHE   . PERIPHERAL NEUROPATHY     Past Surgical History  Procedure Date  . Appendectomy 1992  . Abdominal hysterectomy 1996  . Cesarean section 1989  . Cholecystectomy 04/22/2011    Procedure: LAPAROSCOPIC CHOLECYSTECTOMY WITH INTRAOPERATIVE CHOLANGIOGRAM;  Surgeon: Almond Lint, MD;  Location: [facility] OR;  Service: General;  Laterality: N/A;    Family History  Problem Relation Age of Onset  . Diabetes Mother   . Prostate cancer Father   . Hypertension Father   . Heart disease Father   . Diabetes Brother   . Alcohol abuse Other   . Arthritis Other   . Hypertension Other     History  Substance Use Topics  . Smoking status: Never Smoker   . Smokeless tobacco: Never Used  . Alcohol Use: No    OB History    Grav Para Term Preterm Abortions TAB SAB Ect Mult Living   5 5        5       Review of Systems  All other systems reviewed and are negative.    Allergies  Peanuts; Corn dextrin; Gluten meal; Shellfish  allergy; Soy allergy; Sunflower oil; Tomato; Wheat bran; and Morphine  Home Medications   Current Outpatient Rx  Name  Route  Sig  Dispense  Refill  . DIPHENHYDRAMINE HCL 25 MG PO CAPS   Oral   Take 25 mg by mouth 4 (four) times daily as needed. For itching         . EPINEPHRINE 0.3 MG/0.3ML IJ DEVI   Intramuscular   Inject 0.3 mg into the muscle as needed. For allergic reaction         . ALLEGRA PO   Oral   Take 1 tablet by mouth daily.         Marland Kitchen VITAMIN B-6 100 MG PO TABS   Oral   Take 100 mg by mouth daily.          . TRAVOPROST 0.004 % OP SOLN   Both Eyes   Place 1 drop into both eyes at bedtime.            BP 125/76  Pulse 90  Temp 97.4 F (36.3 C)  Resp 22  SpO2 100%  Physical Exam  Nursing note and vitals reviewed. Constitutional: She is oriented to person, place, and time. She appears well-developed and well-nourished. No distress.  HENT:  Head:  Normocephalic and atraumatic.       Uvula is midline, nonswollen, non-erythemic The remainder of the oropharynx is unremarkable.   Eyes: Conjunctivae normal and EOM are normal.  Cardiovascular: Normal rate and regular rhythm.   Pulmonary/Chest: Effort normal and breath sounds normal. No stridor. No respiratory distress.  Abdominal: She exhibits no distension.  Musculoskeletal: She exhibits no edema.  Lymphadenopathy:       Head (right side): No submental and no submandibular adenopathy present.       Head (left side): No submental and no submandibular adenopathy present.       Right cervical: No superficial cervical and no deep cervical adenopathy present.      Left cervical: No superficial cervical and no deep cervical adenopathy present.  Neurological: She is alert and oriented to person, place, and time. No cranial nerve deficit.  Skin: Skin is warm and dry.  Psychiatric: She has a normal mood and affect.    ED Course  Procedures (including critical care time)  Labs Reviewed - No data to  display No results found.   No diagnosis found.   O2 99% room air normal  8:35 PM Patient remains in no distress. No new dyspnea.  She continues to c/o itchy throat.  9:25 PM VSS - throat pain persists  A review of the patient's chart demonstrates that she has had similar throat tickle/itchy sensation for at least one month.  The patient states that she has had similar symptoms on and off for about that time, though this episode is more pronounced. 11-03-2308: Patient remains in no distress, oxygen saturation is appropriate.  We discussed the need for outpatient follow up with her primary care physician and HEENT doctor for consideration of other causes of her persistent throat discomfort, including laryngitis, vocal cord paralysis or inflammation.    MDM  This young female presents after a likely allergic reaction, now in in no distress, with no hypoxia, no tachypnea, no tachycardia.  The patient was observed for several hours, and already having received her steroids, antihistamines, and subjective comfort with topical lidocaine, with no evidence of rebound effect, she was discharged in stable condition.    Gerhard Munch, MD 06/23/12 [phone]

## 2012-06-22 NOTE — ED Notes (Signed)
Pt states she is still not feeling any better, that her throat is still "itchy"  MD notified

## 2012-06-22 NOTE — ED Notes (Signed)
Per ems- pt was at work and ate cranberries which contained sunflower oil. Pt developed a cough and her throat was "itchy and felt like it was closing." Pt gave herself 50mg  benadryl, 1 epi pen. EMs gave pt 50mg  zantac and 125 solumedrol. Pt states to ems she now feels worse. ABCs intact. Pt able to speak. BP-125/94 HR-98 O2-100% on RA

## 2012-07-09 ENCOUNTER — Ambulatory Visit (INDEPENDENT_AMBULATORY_CARE_PROVIDER_SITE_OTHER): Payer: Managed Care, Other (non HMO) | Admitting: Internal Medicine

## 2012-07-09 ENCOUNTER — Encounter: Payer: Self-pay | Admitting: Internal Medicine

## 2012-07-09 DIAGNOSIS — Z5189 Encounter for other specified aftercare: Secondary | ICD-10-CM

## 2012-07-09 MED ORDER — PREDNISONE 10 MG PO TABS: ORAL_TABLET | ORAL | Status: DC

## 2012-07-09 MED ORDER — METHYLPREDNISOLONE ACETATE 80 MG/ML IJ SUSP
80.0000 mg | Freq: Once | INTRAMUSCULAR | Status: AC
  Administered 2012-07-09: 80 mg via INTRAMUSCULAR

## 2012-07-09 NOTE — Patient Instructions (Signed)
Food Allergy and Anaphylaxis A food allergy occurs when the body reacts to proteins found in food. In the most common type of food allergy, the immune system creates chemicals usually made to fight germs (antibodies). These chemicals cause problems when the protein is eaten. Problems can also happen when the food is touched or bits of food get into the air (such as while cooking) near someone who is allergic. The problems caused are the allergic symptoms. This type of food allergy must be taken seriously. Even very small amounts of a food can cause a reaction. Severe reactions can be sudden and potentially fatal. This type of food allergy can vary from mild to life threatening (anaphylaxis). It is impossible to predict what the next reaction will be like based on previous reactions.  There can be other problems with food that are not actually allergies. This document only reviews food allergies. CAUSES  It is not known why some people develop food allergy. It may be more common in families with other allergic problems. Any food can cause allergies but the most common ones are:  Peanuts.  Tree nuts (such as almonds, walnuts, hazelnuts, and pecans).  Fish (such as bass, flounder, and cod).  Shellfish (such as crab, lobster, and shrimp).  Milk.  Eggs.  Wheat.  Soybeans. SYMPTOMS  The symptoms of food allergy generally start within minutes to 2 hours after contact with the allergen. The symptoms can remain the same for several hours or get worse. Sometimes the reaction seems to improve only to return (often worse) later. Common signs/symptoms of a reaction include any or all of the following:  Skin: hives, itching, swelling, flushing.  Eyes: red, watery, itchy, swollen.  Nose: congested, runny, sneezing.  Mouth/throat: swelling of lips, tongue and throat. Itchy throat, hoarseness, choking sensation.  Lungs: Cough, difficulty breathing, wheezing (whistling noise when breathing  out).  Gastrointestinal tract: Nausea (feeling sick to one's stomach), vomiting, diarrhea, cramps.  Heart and circulation: dizziness, weakness, fainting, turning pale, fast, slow or irregular pulse.  Nervous system: confusion, fear, sense of doom. Anaphylaxis is the most serious food allergy reaction. It can rapidly cause throat and tongue swelling, difficulty breathing, low blood pressure, collapse and cardiac arrest (heart stops breathing). DIAGNOSIS  The diagnosis of food allergy is made in part on the history of prior reactions. To prove the diagnosis and to find what foods are responsible, your caregiver may suggest:  Allergy skin tests  usually done by allergists in a setting where emergency treatment is available.  Blood tests for allergy.  Food challenges  suspected food is given and the person is observed in a setting where emergency treatment is available.  Food diary  recording foods eaten and reactions.  Elimination diet  suspected food(s) are removed from the diet. TREATMENT  Your caregiver may prescribe medicines to treat an allergic reaction such as:  Epinephrine (also called adrenaline), which comes in a self-injecting device. This is the most important medicine for treating serious food allergies.  Asthma medicine  usually inhaled  to treat breathing problems  in addition to epinephrine.  Antihistamines to treat swelling and itching  in addition to epinephrine.  Steroids given to calm down a serious reaction. Children can outgrow certain food allergies (like milk and eggs). Peanut and tree nut allergies are less likely to be outgrown. Because of this, sometimes repeat allergy testing is done. HOME CARE INSTRUCTIONS  Avoid contact with the offending food. Strict avoidance is the only way to prevent a  reaction.   Keep the food out of the house.  Learn how to read food labels in order to spot the presence of any food you are allergic to. If a packaged food product  does not contain an ingredient label, avoid the food product.  If you must have the offending food in the house, discuss how to avoid it with your caregiver. Be especially careful when eating in a restaurant.  Ask the cook or manager (not the waiter) if foods you are allergic to are found in any items on the menu.  Avoid:  [place] foods since oil can keep proteins from previously cooked foods.  Ice cream parlors, Asian restaurants and bakeries - these often use peanut or tree nut ingredients.  Buffets, picnics, school lunches and salad bars because of the risk of contact with foods you are allergic to.  Food prepared in a blender or shared grill.  Request that food be prepared with clean utensils or pans to avoid contamination from prior foods.  Let the staff know you have allergies that can cause serious reactions or death.  If you have a reaction, administer treatment and tell the staff to immediately call for emergency services ( 911 in the [place] If planning travel by air, inform the airline ahead of time (when making the reservation) that you have a food allergy. Wear a medical identification bracelet or necklace (such as one offered by [employer] ) noting your allergy.  If an epinephrine self-injector device is prescribed:  Carry your device everywhere at all times.  Learn how to use the device. Practice by injecting an expired injector into an orange.  Teach all family members, teachers, coaches, etc to use the injector.  Make an injector available at school, work, Catering manager.  Keep a spare injector in your car.  Educate others about your allergy. This includes school teachers and other school personnel. Tell them what you need to avoid, the symptoms of an allergic reaction, and how others can help during an allergic emergency.  If you experience a subsequent allergic reaction, administer epinephrine promptly and immediately call for emergency services (911 in the [place] SEEK  MEDICAL CARE IF:   You have questions about food allergy or its treatments.  Allergy reactions are getting worse or more frequent.  You suspect new food allergies. SEEK IMMEDIATE MEDICAL CARE IF:   You or your child has a serious allergic reaction, even if you have given a shot of epinephrine.  Symptoms return after taking prescribed treatments. MAKE SURE YOU:   Understand these instructions.  Will watch your condition.  Will get help right away if you are not doing well or get worse. Document Released: 02/23/2008 Document Revised: 08/08/2011 Document Reviewed: 04/02/2008 [facility] Patient Information 2013 [place], [place].

## 2012-07-09 NOTE — Progress Notes (Signed)
Subjective:    Patient ID: Megan Wilkerson, female    DOB: 1965/08/14, 47 y.o.   MRN: [id number]  HPI  Pt presents to the clinic today with c/o of exposure to peanut allergy. She was at a play on Saturday when then women in front of her as well as the women beside her were consuming peanuts and peanut containing crackers. She did not notice any effects on Saturday night but Sunday morning she woke up with a raspy voice, difficulty breathing while walking, and hives. She did take a benadryl which did calm the itching down. She has experienced this before. She does have an Epi[en but did not feel like she needed to use it.  Review of Systems  Past Medical History  Diagnosis Date  . ANEMIA-NOS   . GERD   . HYPERLIPIDEMIA, WITH LOW HDL   . POSITIVE PPD 1987    s/p 71mo tx  . MIGRAINE HEADACHE   . PERIPHERAL NEUROPATHY     Current Outpatient Prescriptions  Medication Sig Dispense Refill  . diphenhydrAMINE (BENADRYL) 25 mg capsule Take 1 capsule (25 mg total) by mouth 3 (three) times daily. For itching  9 capsule  0  . EPINEPHrine (EPIPEN) 0.3 mg/0.3 mL DEVI Inject 0.3 mLs (0.3 mg total) into the muscle as needed. For allergic reaction  2 Device  2  . famotidine (PEPCID) 20 MG tablet Take 1 tablet (20 mg total) by mouth 2 (two) times daily.  4 tablet  0  . Fexofenadine HCl (ALLEGRA PO) Take 1 tablet by mouth daily.      Marland Kitchen pyridOXINE (VITAMIN B-6) 100 MG tablet Take 100 mg by mouth daily.       . travoprost, benzalkonium, (TRAVATAN) 0.004 % ophthalmic solution Place 1 drop into both eyes at bedtime.        No current facility-administered medications for this visit.    Allergies  Allergen Reactions  . Peanuts (Peanut Oil) Anaphylaxis and Itching  . Corn Dextrin Other (See Comments)    Plain corn ok, popcorn causes hives  . Gluten Meal Other (See Comments)    cough  . Shellfish Allergy Itching  . Soy Allergy Other (See Comments)    coughing  . Sunflower Oil Itching  . Tomato  Itching  . Wheat Bran     swelling  . Morphine Itching and Rash    Family History  Problem Relation Age of Onset  . Diabetes Mother   . Prostate cancer Father   . Hypertension Father   . Heart disease Father   . Diabetes Brother   . Alcohol abuse Other   . Arthritis Other   . Hypertension Other     History   Social History  . Marital Status: Married    Spouse Name: N/A    Number of Children: N/A  . Years of Education: N/A   Occupational History  . Not on file.   Social History Main Topics  . Smoking status: Never Smoker   . Smokeless tobacco: Never Used  . Alcohol Use: No  . Drug Use: No  . Sexually Active: Not on file   Other Topics Concern  . Not on file   Social History Narrative  . No narrative on file     Constitutional: Denies fever, malaise, fatigue, headache or abrupt weight changes.  Respiratory: Pt reports difficulty breathing and shortness of breath.  Denies cough or sputum production.   Cardiovascular: Denies chest pain, chest tightness, palpitations or swelling in the  hands or feet.  Gastrointestinal: Denies abdominal pain, bloating, constipation, diarrhea or blood in the stool.  Skin: Pt reports hives. Denies  lesions or ulcercations.  Neurological: Pt reports dizziness. Denies difficulty with memory, difficulty with speech or problems with balance and coordination.   No other specific complaints in a complete review of systems (except as listed in HPI above).     Objective:   Physical Exam   BP 128/84  Pulse 90  Temp(Src) 98.3 F (36.8 C) (Oral)  SpO2 97% Wt Readings from Last 3 Encounters:  05/09/12 218 lb 6.4 oz (99.066 kg)  03/20/12 214 lb (97.07 kg)  03/16/12 214 lb (97.07 kg)    General: Appears her stated age, well developed, well nourished in NAD. Skin: Hives noted on the upper chest just below the neck. No lesions or ulcerations noted. Cardiovascular: Normal rate and rhythm. S1,S2 noted.  No murmur, rubs or gallops noted.  No JVD or BLE edema. No carotid bruits noted. Pulmonary/Chest: Mildly increased effort and positive vesicular breath sounds. No respiratory distress. No wheezes, rales or ronchi noted. Intermittent mild inspiratory stridor. Abdomen: Soft and nontender. Normal bowel sounds, no bruits noted. No distention or masses noted. Liver, spleen and kidneys non palpable. Neurological: Alert and oriented. Cranial nerves II-XII intact. Coordination normal. +DTRs bilaterally.      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

## 2012-08-15 ENCOUNTER — Emergency Department (HOSPITAL_COMMUNITY)
Admission: EM | Admit: 2012-08-15 | Discharge: 2012-08-15 | Disposition: A | Discharge status: Home / Self Care | Payer: Managed Care, Other (non HMO) | Attending: Emergency Medicine | Admitting: Emergency Medicine

## 2012-08-15 DIAGNOSIS — R062 Wheezing: Secondary | ICD-10-CM | POA: Insufficient documentation

## 2012-08-15 DIAGNOSIS — R0602 Shortness of breath: Secondary | ICD-10-CM | POA: Insufficient documentation

## 2012-08-15 DIAGNOSIS — L299 Pruritus, unspecified: Secondary | ICD-10-CM | POA: Insufficient documentation

## 2012-08-15 DIAGNOSIS — Z862 Personal history of diseases of the blood and blood-forming organs and certain disorders involving the immune mechanism: Secondary | ICD-10-CM | POA: Insufficient documentation

## 2012-08-15 DIAGNOSIS — Z8639 Personal history of other endocrine, nutritional and metabolic disease: Secondary | ICD-10-CM | POA: Insufficient documentation

## 2012-08-15 DIAGNOSIS — Z8679 Personal history of other diseases of the circulatory system: Secondary | ICD-10-CM | POA: Insufficient documentation

## 2012-08-15 DIAGNOSIS — L272 Dermatitis due to ingested food: Secondary | ICD-10-CM | POA: Insufficient documentation

## 2012-08-15 DIAGNOSIS — IMO0002 Reserved for concepts with insufficient information to code with codable children: Secondary | ICD-10-CM | POA: Insufficient documentation

## 2012-08-15 DIAGNOSIS — Z79899 Other long term (current) drug therapy: Secondary | ICD-10-CM | POA: Insufficient documentation

## 2012-08-15 DIAGNOSIS — K219 Gastro-esophageal reflux disease without esophagitis: Secondary | ICD-10-CM | POA: Insufficient documentation

## 2012-08-15 DIAGNOSIS — Z91010 Allergy to peanuts: Secondary | ICD-10-CM

## 2012-08-15 DIAGNOSIS — R6889 Other general symptoms and signs: Secondary | ICD-10-CM | POA: Insufficient documentation

## 2012-08-15 DIAGNOSIS — R42 Dizziness and giddiness: Secondary | ICD-10-CM | POA: Insufficient documentation

## 2012-08-15 DIAGNOSIS — R0789 Other chest pain: Secondary | ICD-10-CM | POA: Insufficient documentation

## 2012-08-15 DIAGNOSIS — J029 Acute pharyngitis, unspecified: Secondary | ICD-10-CM | POA: Insufficient documentation

## 2012-08-15 DIAGNOSIS — Z8669 Personal history of other diseases of the nervous system and sense organs: Secondary | ICD-10-CM | POA: Insufficient documentation

## 2012-08-15 DIAGNOSIS — R498 Other voice and resonance disorders: Secondary | ICD-10-CM | POA: Insufficient documentation

## 2012-08-15 DIAGNOSIS — T7840XA Allergy, unspecified, initial encounter: Secondary | ICD-10-CM

## 2012-08-15 MED ORDER — FAMOTIDINE IN NACL 20-0.9 MG/50ML-% IV SOLN
20.0000 mg | Freq: Once | INTRAVENOUS | Status: AC
  Administered 2012-08-15: 20 mg via INTRAVENOUS
  Filled 2012-08-15: qty 50

## 2012-08-15 MED ORDER — PREDNISONE 20 MG PO TABS: 40.0000 mg | ORAL_TABLET | Freq: Every day | ORAL | Status: DC

## 2012-08-15 MED ORDER — FAMOTIDINE 20 MG PO TABS: 20.0000 mg | ORAL_TABLET | Freq: Two times a day (BID) | ORAL | Status: DC

## 2012-08-15 MED ORDER — EPINEPHRINE 0.3 MG/0.3ML IJ DEVI: 0.3000 mg | Freq: Once | INTRAMUSCULAR | Status: DC | PRN

## 2012-08-15 MED ORDER — METHYLPREDNISOLONE SODIUM SUCC 125 MG IJ SOLR
125.0000 mg | Freq: Once | INTRAMUSCULAR | Status: AC
  Administered 2012-08-15: 125 mg via INTRAVENOUS
  Filled 2012-08-15: qty 2

## 2012-08-15 NOTE — ED Notes (Signed)
Pt ambulated to restroom with stand by assistance

## 2012-08-15 NOTE — ED Notes (Addendum)
Per report pt was at work and was accidentally exposed to a peanut.  Pt states that she initially had a scratchy throat and felt like her throat was swelling.  Pt took 25 mg Benadryl PO(1710).  She then administered herself her Epipen (R thigh 1710).  On EMS arrival pt was noted to have sats of 100% on RA.  Pt still felt as though her throat was swelling and 25mg  of Benadryl IV (1714) was administered.  Lung fields were clear on EMS arrival and continue to be clear now.  Pt is in no distress and is able to talk in complete sentences but is noted to have a raspy voice.

## 2012-08-15 NOTE — ED Notes (Signed)
Pt discharged.Vital signs stable and GCS 15 

## 2012-08-15 NOTE — ED Provider Notes (Signed)
History     CSN: [id number]  Arrival date & time 08/15/12  1738   First MD Initiated Contact with Patient 08/15/12 1741      Chief Complaint  Patient presents with  . Allergic Reaction    (Consider location/radiation/quality/duration/timing/severity/associated sxs/prior treatment) The history is provided by the patient, the spouse and medical records. No language interpreter was used.    Megan Wilkerson is a 47 y.o. female  with a hx of peanut allergies presents to the Emergency Department complaining of gradual, persistent, rapidly and progressively worsening throat swelling and itching onset approx 5pm.   Pt states she thinks she was exposed to a peanut in food that he student brought to school.  Associated symptoms include sneezing, itching throat, wheezing, SOB.  Epi pen makes it better and nothing makes it worse.  Pt denies fever, chills, headache, neck pain, chest pain, abdominal pain, nausea, vomiting, diarrhea, weakness, dizziness, syncope, dysuria.     Past Medical History  Diagnosis Date  . ANEMIA-NOS   . GERD   . HYPERLIPIDEMIA, WITH LOW HDL   . POSITIVE PPD 1987    s/p 7mo tx  . MIGRAINE HEADACHE   . PERIPHERAL NEUROPATHY     Past Surgical History  Procedure Laterality Date  . Appendectomy  1992  . Abdominal hysterectomy  1996  . Cesarean section  1989  . Cholecystectomy  04/22/2011    Procedure: LAPAROSCOPIC CHOLECYSTECTOMY WITH INTRAOPERATIVE CHOLANGIOGRAM;  Surgeon: Almond Lint, MD;  Location: [facility] OR;  Service: General;  Laterality: N/A;    Family History  Problem Relation Age of Onset  . Diabetes Mother   . Prostate cancer Father   . Hypertension Father   . Heart disease Father   . Diabetes Brother   . Alcohol abuse Other   . Arthritis Other   . Hypertension Other     History  Substance Use Topics  . Smoking status: Never Smoker   . Smokeless tobacco: Never Used  . Alcohol Use: No    OB History   Grav Para Term Preterm Abortions TAB SAB  Ect Mult Living   5 5        5       Review of Systems  Constitutional: Negative for fever, diaphoresis, appetite change, fatigue and unexpected weight change.  HENT: Positive for sore throat and voice change. Negative for mouth sores and neck stiffness.   Eyes: Positive for itching. Negative for visual disturbance.  Respiratory: Positive for chest tightness and wheezing. Negative for cough and shortness of breath.   Cardiovascular: Negative for chest pain.  Gastrointestinal: Negative for nausea, vomiting, abdominal pain, diarrhea and constipation.  Endocrine: Negative for polydipsia, polyphagia and polyuria.  Genitourinary: Negative for dysuria, urgency, frequency and hematuria.  Musculoskeletal: Negative for back pain.  Skin: Negative for rash.  Allergic/Immunologic: Negative for immunocompromised state.  Neurological: Positive for light-headedness. Negative for syncope and headaches.  Hematological: Does not bruise/bleed easily.  Psychiatric/Behavioral: Negative for sleep disturbance. The patient is not nervous/anxious.     Allergies  Peanuts; Corn dextrin; Gluten meal; Shellfish allergy; Soy allergy; Sunflower oil; Tomato; Wheat bran; and Morphine  Home Medications   Current Outpatient Rx  Name  Route  Sig  Dispense  Refill  . diphenhydrAMINE (BENADRYL) 25 mg capsule   Oral   Take 25 mg by mouth 3 (three) times daily. For itching/allergic reactions         . EPINEPHrine (EPIPEN) 0.3 mg/0.3 mL DEVI   Intramuscular   Inject 0.3  mLs (0.3 mg total) into the muscle as needed. For allergic reaction   2 Device   2   . famotidine (PEPCID) 20 MG tablet   Oral   Take 1 tablet (20 mg total) by mouth 2 (two) times daily.   4 tablet   0   . Fexofenadine HCl (ALLEGRA PO)   Oral   Take 1 tablet by mouth daily.         . naproxen sodium (ANAPROX) 220 MG tablet   Oral   Take 440 mg by mouth 2 (two) times daily with a meal.         . predniSONE (DELTASONE) 10 MG tablet       Take 3 tablets on days 1-3, take 2 tablets on days 4-6, take 1 tablet on days 7-9   18 tablet   0   . pyridOXINE (VITAMIN B-6) 100 MG tablet   Oral   Take 100 mg by mouth daily.          . travoprost, benzalkonium, (TRAVATAN) 0.004 % ophthalmic solution   Both Eyes   Place 1 drop into both eyes at bedtime.          . famotidine (PEPCID) 20 MG tablet   Oral   Take 1 tablet (20 mg total) by mouth 2 (two) times daily.   10 tablet   0   . predniSONE (DELTASONE) 20 MG tablet   Oral   Take 2 tablets (40 mg total) by mouth daily.   10 tablet   0     BP 103/91  Pulse 79  Temp(Src) 98.3 F (36.8 C) (Oral)  Resp 16  SpO2 98%  Physical Exam  Nursing note and vitals reviewed. Constitutional: She is oriented to person, place, and time. She appears well-developed and well-nourished. No distress.  HENT:  Head: Normocephalic and atraumatic.  Right Ear: External ear normal.  Left Ear: External ear normal.  Mouth/Throat: Oropharynx is clear and moist. No oropharyngeal exudate.  Eyes: Conjunctivae and EOM are normal. Pupils are equal, round, and reactive to light. Right eye exhibits no discharge. Left eye exhibits no discharge. No scleral icterus.  Neck: Normal range of motion and full passive range of motion without pain. Neck supple. No tracheal tenderness, no spinous process tenderness and no muscular tenderness present. No tracheal deviation present.  Pt with hoarse voice  Cardiovascular: Normal rate, regular rhythm, normal heart sounds and intact distal pulses.  Exam reveals no gallop and no friction rub.   No murmur heard. Pulmonary/Chest: Effort normal and breath sounds normal. No stridor. No respiratory distress. She has no wheezes. She has no rales. She exhibits no tenderness.  Abdominal: Soft. Bowel sounds are normal. She exhibits no distension and no mass. There is no tenderness. There is no rebound and no guarding.  Musculoskeletal: Normal range of motion. She  exhibits no edema.  Neurological: She is alert and oriented to person, place, and time. No cranial nerve deficit. She exhibits normal muscle tone. Coordination normal.  Speech is clear and goal oriented Moves extremities without ataxia  Skin: Skin is warm and dry. No rash noted. She is not diaphoretic. No erythema.  Psychiatric: She has a normal mood and affect.    ED Course  Procedures (including critical care time)  Labs Reviewed - No data to display No results found.   1. Allergic reaction, initial encounter   2. Food allergy, peanut       MDM  Megan Wilkerson presents after  peanut exposure and Hx of anaphylaxis to the same.  Patient re-evaluated prior to dc, is hemodynamically stable, in no respiratory distress, and denies the feeling of throat closing. Pt has been advised to take OTC benadryl & return to the ED if they have a mod-severe allergic rxn (s/s including throat closing, difficulty breathing, swelling of lips face or tongue). Pt is to follow up with their PCP. Pt is agreeable with plan & verbalizes understanding.       Megan Client Leahann Lempke, PA-C 08/15/12 2110

## 2012-08-16 NOTE — ED Provider Notes (Signed)
Medical screening examination/treatment/procedure(s) were performed by non-physician practitioner and as supervising physician I was immediately available for consultation/collaboration.  Jomar Denz, MD 08/16/12 1428 

## 2012-08-20 ENCOUNTER — Ambulatory Visit (INDEPENDENT_AMBULATORY_CARE_PROVIDER_SITE_OTHER): Payer: Managed Care, Other (non HMO) | Admitting: Internal Medicine

## 2012-08-20 ENCOUNTER — Encounter: Payer: Self-pay | Admitting: *Deleted

## 2012-08-20 ENCOUNTER — Encounter: Payer: Self-pay | Admitting: Internal Medicine

## 2012-08-20 VITALS — BP 100/72 | HR 91 | Temp 97.9°F | Wt 225.8 lb

## 2012-08-20 DIAGNOSIS — T7840XD Allergy, unspecified, subsequent encounter: Secondary | ICD-10-CM

## 2012-08-20 DIAGNOSIS — R51 Headache: Secondary | ICD-10-CM

## 2012-08-20 DIAGNOSIS — J309 Allergic rhinitis, unspecified: Secondary | ICD-10-CM

## 2012-08-20 DIAGNOSIS — Z5189 Encounter for other specified aftercare: Secondary | ICD-10-CM

## 2012-08-20 DIAGNOSIS — T7840XA Allergy, unspecified, initial encounter: Secondary | ICD-10-CM

## 2012-08-20 MED ORDER — ALBUTEROL SULFATE HFA 108 (90 BASE) MCG/ACT IN AERS: 2.0000 | INHALATION_SPRAY | Freq: Four times a day (QID) | RESPIRATORY_TRACT | Status: DC | PRN

## 2012-08-20 MED ORDER — TRAMADOL HCL 50 MG PO TABS: 50.0000 mg | ORAL_TABLET | Freq: Three times a day (TID) | ORAL | Status: DC | PRN

## 2012-08-20 MED ORDER — PREDNISONE (PAK) 10 MG PO TABS: 10.0000 mg | ORAL_TABLET | ORAL | Status: DC

## 2012-08-20 MED ORDER — FLUTICASONE PROPIONATE 50 MCG/ACT NA SUSP: 2.0000 | Freq: Every day | NASAL | Status: DC

## 2012-08-20 MED ORDER — METHYLPREDNISOLONE ACETATE 80 MG/ML IJ SUSP
80.0000 mg | Freq: Once | INTRAMUSCULAR | Status: AC
  Administered 2012-08-20: 80 mg via INTRAMUSCULAR

## 2012-08-20 NOTE — Patient Instructions (Signed)
It was good to see you today. Medrol shot given today for allergic reaction Start prednisone, taper over next 6 days. Also provided prescription for tramadol to use as needed for pain and albuterol inhaler to use as needed for chest tightness or wheeze Your prescription(s) have been submitted to your pharmacy. Please take as directed and contact our office if you believe you are having problem(s) with the medication(s). Continue EpiPen IF tongue/throat swelling or trouble breathing  Stop Aleve and avoid ibuprofen or aspirin products Work excuse given for today and tomorrow  Keep April appointment as scheduled with Dr Maple Hudson, call sooner if other problems

## 2012-08-20 NOTE — Progress Notes (Signed)
Subjective:    Patient ID: Megan Wilkerson, female    DOB: 1965/11/05, 47 y.o.   MRN: [id number]  HPI  complains of throat problem and recurrent hives -  Diffuse involving trunk, arms, neck, face  Onset 01/30/12 - no prior history of same Initially improved with benadryl, but less relief now  -recurrent whelps and itch Last episode Wed: tongue and lip swelling, shortness of breath but no wheeze - describes episode of "throat and mouth itching" persisting since that time Denies new exposure to pet/food or meds -  Ongoing prn naprosyn for headache symptoms No hx asthma but + allergies  Past Medical History  Diagnosis Date  . ANEMIA-NOS   . GERD   . HYPERLIPIDEMIA, WITH LOW HDL   . POSITIVE PPD 1987    s/p 77mo tx  . MIGRAINE HEADACHE   . PERIPHERAL NEUROPATHY     Review of Systems  Constitutional: Positive for fatigue. Negative for fever.  HENT: Positive for voice change. Negative for sinus pressure.   Respiratory: Negative for cough, shortness of breath, wheezing and stridor.   Cardiovascular: Negative for chest pain, palpitations and leg swelling.  Musculoskeletal: Negative for joint swelling and arthralgias.  Skin: Positive for rash.  Neurological: Negative for dizziness and headaches.       Objective:   Physical Exam  BP 100/72  Pulse 91  Temp(Src) 97.9 F (36.6 C) (Oral)  Wt 225 lb 12.8 oz (102.422 kg)  BMI 36.46 kg/m2  SpO2 97% Constitutional: She appears well-developed. Hoarse, but nontoxic appearing  HENT: Head: Normocephalic and atraumatic. Ears; B TMs ok, no erythema or effusion; Nose: Nose normal. Mouth/Throat: Oropharynx is clear and moist, mod erythema. No oropharyngeal exudate. no visible angioedema of lips/tongue/OP - Eyes: Conjunctivae and EOM are normal. Pupils are equal, round, and reactive to light. No scleral icterus.  Neck: Normal range of motion. Neck supple. No JVD or LAD present. No thyromegaly present.  Cardiovascular: Normal rate, regular  rhythm and normal heart sounds.  No murmur heard. No BLE edema. Pulmonary/Chest: Effort normal and breath sounds clear. No respiratory distress. She has no wheezes.  Neuro: AAOx4 - CN2-12 symmetric - speech and balance normal Skin: few hives on BUE, neck anterior chest and back - face and front torso spared at this time - Skin is warm and dry. No confluent or diffuse rash noted. No erythema.  Psychiatric: She has a normal mood and affect. Her behavior is normal. Judgment and thought content normal.      Lab Results  Component Value Date   WBC 4.8 01/31/2012   HGB 15.4* 01/31/2012   HCT 44.9 01/31/2012   PLT 225.0 01/31/2012   CHOL 185 09/10/2009   TRIG 101.0 09/10/2009   HDL 36.40* 09/10/2009   ALT 18 09/19/2011   AST 21 09/19/2011   NA 143 09/19/2011   K 4.3 09/19/2011   CL 108 09/19/2011   CREATININE 0.7 09/19/2011   BUN 10 09/19/2011   CO2 29 09/19/2011   TSH 0.79 01/14/2010   HGBA1C 6.0 08/21/2008     Assessment & Plan:   Allergic reaction with angioedema -  manifest with episodes of anaphylaxis, reviewed recent ER visits for same Also Hives, cyclical - etiology unknown -  Acute onset early 01/2012 - denies prior hx same S/p allergy eval 02/2012 ([facility]) IM Medrol today and Pred pak Reviewed results of allergy panel for environmental and food allergies from 9/3 testing> multiple Use zyrtec daily in addition to benadryl prn (or other antihistamine)  Add Flonase and Alb DI (though no specific asthma 04/2012 office spirometry) continue prn EpiPen provided in case of shortness of breath symptoms Instructed on importance of avoiding NSAIDs, use tramadol as needed for pain and placed as prescribed Anaprox Work excuse provided as recurrent symptoms has kept patient from job   allergic rhinitis - add nasal steroids - erx done  headache - uses OTC NSAIDs for same (still!) - use tramadol if tylenol ineffective - erx done

## 2012-08-29 ENCOUNTER — Encounter: Payer: Self-pay | Admitting: Internal Medicine

## 2012-08-29 ENCOUNTER — Other Ambulatory Visit (INDEPENDENT_AMBULATORY_CARE_PROVIDER_SITE_OTHER): Payer: Managed Care, Other (non HMO)

## 2012-08-29 ENCOUNTER — Ambulatory Visit (INDEPENDENT_AMBULATORY_CARE_PROVIDER_SITE_OTHER): Payer: Managed Care, Other (non HMO) | Admitting: Internal Medicine

## 2012-08-29 ENCOUNTER — Encounter: Payer: Self-pay | Admitting: *Deleted

## 2012-08-29 VITALS — BP 118/78 | HR 91 | Temp 98.6°F | Resp 16 | Wt 226.0 lb

## 2012-08-29 DIAGNOSIS — R490 Dysphonia: Secondary | ICD-10-CM

## 2012-08-29 DIAGNOSIS — J45991 Cough variant asthma: Secondary | ICD-10-CM

## 2012-08-29 DIAGNOSIS — J309 Allergic rhinitis, unspecified: Secondary | ICD-10-CM

## 2012-08-29 DIAGNOSIS — L509 Urticaria, unspecified: Secondary | ICD-10-CM

## 2012-08-29 LAB — HEPATIC FUNCTION PANEL
Alkaline Phosphatase: 52 U/L (ref 39–117)
Bilirubin, Direct: 0.1 mg/dL (ref 0.0–0.3)
Total Bilirubin: 0.4 mg/dL (ref 0.3–1.2)

## 2012-08-29 LAB — CBC WITH DIFFERENTIAL/PLATELET
Basophils Relative: 0.6 % (ref 0.0–3.0)
Eosinophils Absolute: 0.1 10*3/uL (ref 0.0–0.7)
HCT: 41.8 % (ref 36.0–46.0)
Lymphs Abs: 2.3 10*3/uL (ref 0.7–4.0)
MCHC: 34.1 g/dL (ref 30.0–36.0)
MCV: 90.7 fl (ref 78.0–100.0)
Monocytes Absolute: 0.4 10*3/uL (ref 0.1–1.0)
Neutrophils Relative %: 53.3 % (ref 43.0–77.0)
Platelets: 192 10*3/uL (ref 150.0–400.0)

## 2012-08-29 LAB — BASIC METABOLIC PANEL
BUN: 11 mg/dL (ref 6–23)
CO2: 28 mEq/L (ref 19–32)
Chloride: 103 mEq/L (ref 96–112)
Creatinine, Ser: 0.8 mg/dL (ref 0.4–1.2)

## 2012-08-29 LAB — TSH: TSH: 0.79 u[IU]/mL (ref 0.35–5.50)

## 2012-08-29 MED ORDER — BUDESONIDE-FORMOTEROL FUMARATE 80-4.5 MCG/ACT IN AERO: 2.0000 | INHALATION_SPRAY | Freq: Two times a day (BID) | RESPIRATORY_TRACT | Status: DC

## 2012-08-29 MED ORDER — AZELASTINE HCL 0.1 % NA SOLN: 2.0000 | Freq: Two times a day (BID) | NASAL | Status: DC

## 2012-08-29 NOTE — Progress Notes (Signed)
  Subjective:    HPI  complains of cough and congestion  Onset >1 week ago, wax/wane symptoms for several months in setting of allergic reaction NOS associated with rhinorrhea, sneezing, sore throat, mild headache and low grade fever Also wheeze and hoarse, few hives in past 2 weeks but still "itchy spots come and go" on chin/neck/lower face No relief with OTC meds, temporary relief with Alb MDI   Past Medical History  Diagnosis Date  . ANEMIA-NOS   . GERD   . HYPERLIPIDEMIA, WITH LOW HDL   . POSITIVE PPD 1987    s/p 40mo tx  . MIGRAINE HEADACHE   . PERIPHERAL NEUROPATHY     Review of Systems Constitutional: No fever or night sweats, no unexpected weight change Pulmonary: No pleurisy or hemoptysis Cardiovascular: No chest pain or palpitations     Objective:   Physical Exam BP 118/78  Pulse 91  Temp(Src) 98.6 F (37 C) (Oral)  Resp 16  Wt 226 lb (102.513 kg)  BMI 36.49 kg/m2  SpO2 97% GEN: nontoxic appearing, hoarse - no audible head/chest congestion HENT: NCAT, no sinus tenderness bilaterally, nares without discharge, oropharynx mild erythema, no exudate Eyes: Vision grossly intact, no conjunctivitis Lungs: Clear to auscultation without rhonchi or wheeze, no increased work of breathing Cardiovascular: Regular rate and rhythm, no bilateral edema  Lab Results  Component Value Date   WBC 4.8 01/31/2012   HGB 15.4* 01/31/2012   HCT 44.9 01/31/2012   PLT 225.0 01/31/2012   GLUCOSE 98 09/19/2011   CHOL 185 09/10/2009   TRIG 101.0 09/10/2009   HDL 36.40* 09/10/2009   LDLCALC 128* 09/10/2009   ALT 18 09/19/2011   AST 21 09/19/2011   NA 143 09/19/2011   K 4.3 09/19/2011   CL 108 09/19/2011   CREATININE 0.7 09/19/2011   BUN 10 09/19/2011   CO2 29 09/19/2011   TSH 0.79 01/14/2010   HGBA1C 6.0 08/21/2008      Assessment & Plan:   Horaseness Cough, ?asthma variant allergic rhinitis with postnasal drip, exacerbating above Hives, cyclical - etiology unknown  Explained lack of  efficacy for antibiotics Check labs now Add nasal antihistamine, continue Flonase and oral antihistamine -  Acute onset early 01/2012 - denies prior hx same  S/p allergy eval 02/2012 ([facility])  S/p IM Medrol 2 weeks ago and Pred pak - min improvement in symptoms  Reviewed results of allergy panel for environmental and food allergies from 01/31/12 testing> multiple  Reports symptoms temp improved with Alb MDI (though no specific asthma 04/2012 office spirometry) -  send for PFTs now and try Symbicort trial pending results follow up with Dr Maple Hudson re: allergy eval and tx next week as planned

## 2012-08-29 NOTE — Patient Instructions (Signed)
It was good to see you today. We have reviewed your prior records including labs and tests today Test(s) ordered today. Your results will be released to MyChart (or called to you) after review, usually within 72hours after test completion. If any changes need to be made, you will be notified at that same time. we'll make referral For detailed pulmonary function test (PFTs)  . Our office will contact you regarding appointment(s) once made. Start symicort inhaler 2x/day every day - sample given and prescription/coupon given to you Continue "rescue" albuterol inhaler as needed for chest tightness or wheeze Your prescription(s) have been submitted to your pharmacy. Please take as directed and contact our office if you believe you are having problem(s) with the medication(s). Do not take Aleve and avoid ibuprofen or aspirin products Work excuse given for today and tomorrow  Keep April appointment as scheduled with Dr Maple Hudson, call sooner if other problems

## 2012-09-06 ENCOUNTER — Emergency Department (HOSPITAL_COMMUNITY)
Admission: EM | Admit: 2012-09-06 | Discharge: 2012-09-06 | Disposition: A | Discharge status: Home / Self Care | Payer: Managed Care, Other (non HMO) | Attending: Emergency Medicine | Admitting: Emergency Medicine

## 2012-09-06 DIAGNOSIS — X58XXXA Exposure to other specified factors, initial encounter: Secondary | ICD-10-CM | POA: Insufficient documentation

## 2012-09-06 DIAGNOSIS — T7840XD Allergy, unspecified, subsequent encounter: Secondary | ICD-10-CM

## 2012-09-06 DIAGNOSIS — Z8669 Personal history of other diseases of the nervous system and sense organs: Secondary | ICD-10-CM | POA: Insufficient documentation

## 2012-09-06 DIAGNOSIS — Z8611 Personal history of tuberculosis: Secondary | ICD-10-CM | POA: Insufficient documentation

## 2012-09-06 DIAGNOSIS — E785 Hyperlipidemia, unspecified: Secondary | ICD-10-CM | POA: Insufficient documentation

## 2012-09-06 DIAGNOSIS — T4995XA Adverse effect of unspecified topical agent, initial encounter: Secondary | ICD-10-CM | POA: Insufficient documentation

## 2012-09-06 DIAGNOSIS — Z79899 Other long term (current) drug therapy: Secondary | ICD-10-CM | POA: Insufficient documentation

## 2012-09-06 DIAGNOSIS — Y939 Activity, unspecified: Secondary | ICD-10-CM | POA: Insufficient documentation

## 2012-09-06 DIAGNOSIS — R490 Dysphonia: Secondary | ICD-10-CM | POA: Insufficient documentation

## 2012-09-06 DIAGNOSIS — Y9269 Other specified industrial and construction area as the place of occurrence of the external cause: Secondary | ICD-10-CM | POA: Insufficient documentation

## 2012-09-06 DIAGNOSIS — Y990 Civilian activity done for income or pay: Secondary | ICD-10-CM | POA: Insufficient documentation

## 2012-09-06 DIAGNOSIS — Z8679 Personal history of other diseases of the circulatory system: Secondary | ICD-10-CM | POA: Insufficient documentation

## 2012-09-06 DIAGNOSIS — Z862 Personal history of diseases of the blood and blood-forming organs and certain disorders involving the immune mechanism: Secondary | ICD-10-CM | POA: Insufficient documentation

## 2012-09-06 DIAGNOSIS — R110 Nausea: Secondary | ICD-10-CM | POA: Insufficient documentation

## 2012-09-06 DIAGNOSIS — Z8719 Personal history of other diseases of the digestive system: Secondary | ICD-10-CM | POA: Insufficient documentation

## 2012-09-06 MED ORDER — PROMETHAZINE HCL 25 MG PO TABS
25.0000 mg | ORAL_TABLET | ORAL | Status: AC
  Administered 2012-09-06: 25 mg via ORAL
  Filled 2012-09-06: qty 1

## 2012-09-06 NOTE — ED Notes (Signed)
Per EMS, patient had an allergic reaction, but unsure as to what she had an allergic reaction to.   Patient was given albuterol inhaler x 4 puffs and epi-pen adult at work by Engineer, [employer] (consulting).   Patient was given Benadryl 50 mg IV and Ranitidine 50 mg IV by EMS.  IV placed by EMS 20 G R hand.

## 2012-09-06 NOTE — ED Provider Notes (Signed)
History     CSN: [id number]  Arrival date & time 09/06/12  1514   First MD Initiated Contact with Patient 09/06/12 1517      Chief Complaint  Patient presents with  . Allergic Reaction    unsure of what she was in contact with    (Consider location/radiation/quality/duration/timing/severity/associated sxs/prior treatment) Patient is a 47 y.o. female presenting with allergic reaction. The history is provided by the patient, the spouse and the EMS personnel. No language interpreter was used.  Allergic Reaction The primary symptoms are  nausea. The primary symptoms do not include shortness of breath or cough. The current episode started 3 to 5 hours ago. The problem has not changed since onset.This is a recurrent problem.  Pt brought in by EMS for allergic reaction, c/o hoarse voice associated with nausea and weakness.  Pt states she was seen in the ED 2 weeks ago with similar sxs but has not improved since then.  Was referred to her PCP after being seen in the ED.  Dr. Felicity Coyer started her on Cymbacort and advised pt to use OTC Benadryl and epi pen as needed for worsening symptoms.  Pt was advised to come to ED by RN where she works.  Pt was given Benadryl 50mg  IV  And Ranitidine 50mg  IV by EMS.  Pt states she has not noticed any difference in her symptoms.  Denies fever or recent illness.  Past Medical History  Diagnosis Date  . ANEMIA-NOS   . GERD   . HYPERLIPIDEMIA, WITH LOW HDL   . POSITIVE PPD 1987    s/p 38mo tx  . MIGRAINE HEADACHE   . PERIPHERAL NEUROPATHY     Past Surgical History  Procedure Laterality Date  . Appendectomy  1992  . Abdominal hysterectomy  1996  . Cesarean section  1989  . Cholecystectomy  04/22/2011    Procedure: LAPAROSCOPIC CHOLECYSTECTOMY WITH INTRAOPERATIVE CHOLANGIOGRAM;  Surgeon: Almond Lint, MD;  Location: [facility] OR;  Service: General;  Laterality: N/A;    Family History  Problem Relation Age of Onset  . Diabetes Mother   . Prostate cancer Father    . Hypertension Father   . Heart disease Father   . Diabetes Brother   . Alcohol abuse Other   . Arthritis Other   . Hypertension Other     History  Substance Use Topics  . Smoking status: Never Smoker   . Smokeless tobacco: Never Used  . Alcohol Use: No    OB History   Grav Para Term Preterm Abortions TAB SAB Ect Mult Living   5 5        5       Review of Systems  Constitutional: Negative for fever and chills.  Respiratory: Negative for cough, choking, chest tightness, shortness of breath and stridor.   Cardiovascular: Negative for chest pain.  Gastrointestinal: Positive for nausea.    Allergies  Peanuts; Corn dextrin; Gluten meal; Shellfish allergy; Soy allergy; Sunflower oil; Tomato; Wheat bran; and Morphine  Home Medications   Current Outpatient Rx  Name  Route  Sig  Dispense  Refill  . albuterol (PROVENTIL HFA;VENTOLIN HFA) 108 (90 BASE) MCG/ACT inhaler   Inhalation   Inhale 2 puffs into the lungs every 6 (six) hours as needed for wheezing.         Marland Kitchen azelastine (ASTELIN) 137 MCG/SPRAY nasal spray   Nasal   Place 2 sprays into the nose 2 (two) times daily. Use in each nostril as directed         .  brimonidine (ALPHAGAN) 0.15 % ophthalmic solution   Both Eyes   Place 1 drop into both eyes daily.         . budesonide-formoterol (SYMBICORT) 80-4.5 MCG/ACT inhaler   Inhalation   Inhale 2 puffs into the lungs 2 (two) times daily.         . diphenhydrAMINE (BENADRYL) 25 mg capsule   Oral   Take 25 mg by mouth 3 (three) times daily. For itching/allergic reactions         . EPINEPHrine (EPI-PEN) 0.3 mg/0.3 mL DEVI   Intramuscular   Inject 0.3 mg into the muscle once as needed (for severe allergic reaction). CAll 911 immediately if you have to use this medicine         . famotidine (PEPCID) 20 MG tablet   Oral   Take 1 tablet (20 mg total) by mouth 2 (two) times daily.   4 tablet   0   . fexofenadine (ALLEGRA) 180 MG tablet   Oral   Take 180  mg by mouth daily.         . fluticasone (FLONASE) 50 MCG/ACT nasal spray   Nasal   Place 2 sprays into the nose daily.         Marland Kitchen pyridOXINE (VITAMIN B-6) 100 MG tablet   Oral   Take 100 mg by mouth daily.          . traMADol (ULTRAM) 50 MG tablet   Oral   Take 50-100 mg by mouth every 8 (eight) hours as needed for pain.         Marland Kitchen travoprost, benzalkonium, (TRAVATAN) 0.004 % ophthalmic solution   Both Eyes   Place 1 drop into both eyes at bedtime.            BP 98/62  Pulse 85  Temp(Src) 97.7 F (36.5 C) (Oral)  Resp 10  SpO2 100%  Physical Exam  Nursing note and vitals reviewed. Constitutional: She appears well-developed and well-nourished. No distress.  HENT:  Head: Normocephalic and atraumatic.  Mouth/Throat: Oropharynx is clear and moist.  Hoarse voice, no peritonsillar abscess. Mallampati III   Eyes: Conjunctivae are normal.  Neck: Normal range of motion. Neck supple.  Cardiovascular: Normal rate, regular rhythm and normal heart sounds.   Pulmonary/Chest: Effort normal and breath sounds normal. No stridor. No respiratory distress. She has no wheezes. She has no rales. She exhibits no tenderness.  Abdominal: Soft. Bowel sounds are normal. She exhibits no distension. There is no tenderness.  Musculoskeletal: Normal range of motion.  Lymphadenopathy:    She has no cervical adenopathy.  Neurological: She is alert.  Skin: Skin is warm and dry. No rash noted. She is not diaphoretic. No erythema.    ED Course  Procedures (including critical care time)  Labs Reviewed - No data to display No results found.   1. Nausea   2. Voice hoarseness   3. Allergic reaction, subsequent encounter       MDM  Pt presented with recurrent hoarse voice, c/o throat swelling and nausea.  Pt was advised to come to ED by RN at her work who noticed her hoarse voice.  Pt stated this has been going on for 2 weeks, was seen in the ED for similar complaints: referred to PCP  and allergist.  Pt has f/u with PCP who prescribed Symbicort however pt has not noticed any change in her voice.  Pt was scheduled to see allergist tomorrow.    Pt has had Benadryl  50mg  IV and Ranitidine 50mg  IV by EMS PTA w/o change in symptoms.  Will observe pt in ED to ensure symptoms do not worsen.  Likely discharge pt home.  Have her consult with allergist, Dr Leanora Ivanoff, to see if she should still f/u tomorrow or reschedule appoint due to tx given today.  Had pt ambulate for me before being discharged.  Pt was able to ambulate without difficulty.  Denied any dizziness or weakness.  Discharged pt home.  Advised pt to keep allergist appointment scheduled for tomorrow with Dr. Leanora Ivanoff.  Vitals: unremarkable. Discharged in stable condition.    Discussed pt with attending during ED encounter.       Junius Finner, PA-C 09/06/12 1839

## 2012-09-07 ENCOUNTER — Encounter: Payer: Self-pay | Admitting: Internal Medicine

## 2012-09-07 ENCOUNTER — Encounter: Payer: Self-pay | Admitting: *Deleted

## 2012-09-07 ENCOUNTER — Ambulatory Visit (INDEPENDENT_AMBULATORY_CARE_PROVIDER_SITE_OTHER): Payer: Managed Care, Other (non HMO) | Admitting: Internal Medicine

## 2012-09-07 VITALS — BP 148/90 | HR 101 | Ht 66.0 in | Wt 227.2 lb

## 2012-09-07 DIAGNOSIS — Z5189 Encounter for other specified aftercare: Secondary | ICD-10-CM

## 2012-09-07 DIAGNOSIS — T7840XD Allergy, unspecified, subsequent encounter: Secondary | ICD-10-CM

## 2012-09-07 DIAGNOSIS — K219 Gastro-esophageal reflux disease without esophagitis: Secondary | ICD-10-CM

## 2012-09-07 DIAGNOSIS — Z91010 Allergy to peanuts: Secondary | ICD-10-CM

## 2012-09-07 MED ORDER — PREDNISONE 10 MG PO TABS: 10.0000 mg | ORAL_TABLET | Freq: Every day | ORAL | Status: DC

## 2012-09-07 MED ORDER — MONTELUKAST SODIUM 10 MG PO TABS: 10.0000 mg | ORAL_TABLET | Freq: Every day | ORAL | Status: DC

## 2012-09-07 MED ORDER — METHYLPREDNISOLONE ACETATE 80 MG/ML IJ SUSP
80.0000 mg | Freq: Once | INTRAMUSCULAR | Status: AC
  Administered 2012-09-07: 80 mg via INTRAMUSCULAR

## 2012-09-07 NOTE — Patient Instructions (Addendum)
We will give you a letter saying that you are intensely allergic to peanut products and need to work where there is no exposure to peanuts, peanut butter, or people eating peanuts.   Depo 80  Order- Barium swallow for dx esophageal reflux  Script for singulair, 1 daily   Script for prednisone 10 mg daily x 2 weeks  Ok to continue your usual meds  Husband - needs work note saying he had to stay home from work today to care for my patient, his wife.

## 2012-09-07 NOTE — Progress Notes (Signed)
03/16/12- [name] seen in allergy consultation at kind request of  Dr Landry Mellow allergy labs; had reaction to peanuts on Sat-had to use epipen. Husband here. Does not want flu shot. She does not consider herself an allergy person. She  needed Benadryl for urticaria for the first time one month ago. She was given an EpiPen then. On October 12 of this year she got into a car where a friend had been eating peanuts. Her throat began to swell. She was treated with prednisone and used her EpiPen. She says her throat is no better-pain and hoarseness. She has history of GERD but denies asthma or seasonal allergies. No history ENT surgery. CBC- 01/31/12- Eos 5.2 Allergy profiles 01/31/2012-total IgE 912 with specific elevations for many common inhalant allergens and foods. She has been trying to avoid the foods that were listed for her but has not recognized symptoms with any since the exposure to someone eating peanuts.  05/09/12- [name] seen in allergy consultation FOLLOWS FOR: pt reports doing well today, x3 days ago reacted to peanuts at work --SOB, tongue swelling and wheezing used epi pen and symptoms have started improving today She felt fine 3 days ago when she hugged her friend and caught a smell of something. She assumed  friend had been eating peanuts but never asked her. Within a few minutes she began coughing and wheezing with tight throat. She used her EpiPen then went to emergency room. Now taking prednisone as directed. She still has some throat tickle and cough. Skin itches. Taking Zyrtec. Claritin makes her sleepy. Office spirometry-05/09/2012 normal spirometry-FVC 3.34/107%, FEV1 2.56/100%, FEV1/FEC 0.76, FEF 25-75% 2.20/69%.  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 2 weeks ago had conversation with parent to eating peanuts. While talking, her  throat began to tickle, tongue began to swell and throat felt closed. It helped to use EpiPen. One day ago, while working in [employer], same pattern. Again the school nurse had to give EpiPen and she was sent to [place]. She had felt tight in the chest going to work that day and had used rescue inhaler, Symbicort and her daily Allegra. She has been wearing a mask at work. She wants to work away from [employer] where peanut butter is be by the students. She doesn't recognize reflux or heartburn but I discussed this as a possible contribution to the pattern especially if she gets anxious.  ROS-see HPI Constitutional:   No-   weight loss, night sweats, fevers, chills, fatigue, lassitude. HEENT:   No-  headaches, difficulty swallowing, tooth/dental problems, +sore throat, +oarseness      No-  sneezing, +itching, no- ear ache, nasal congestion, post nasal drip,  CV:  No-   chest pain, orthopnea, PND, swelling in lower extremities, anasarca,  dizziness, palpitations Resp: No-   shortness of breath with exertion or at rest.              No-   productive cough,  + non-productive cough,  No- coughing up of blood.              No-   change in color of mucus.  No- wheezing.   Skin: No-   rash or lesions. GI:  +  heartburn, indigestion, +abdominal pain, nausea, vomiting,  GU: n. MS:  No-   joint pain or swelling.   Neuro-  nothing unusual Psych:  No- change in mood or affect. No depression or anxiety.  No memory loss.  OBJ- Physical Exam General- Alert, Oriented, Affect-appropriate, Distress- none acute Skin- rash-none, lesions- none, excoriation- none Lymphadenopathy- none Head- atraumatic            Eyes- Gross vision intact, PERRLA, conjunctivae and secretions clear            Ears- Hearing, canals-normal            Nose- + sniffing, no-Septal dev, mucus, polyps, erosion, perforation             Throat- Mallampati III , mucosa clear , drainage- none, tonsils- atrophic, +hoarse Neck- flexible  , trachea midline, no stridor , thyroid nl, carotid no bruit Chest - symmetrical excursion , unlabored           Heart/CV- RRR , no murmur , no gallop  , no rub, nl s1 s2                           - JVD- none , edema- none, stasis changes- none, varices- none           Lung- clear to P&A, + trace wheeze, + hacking cough , dullness-none, rub- none           Chest wall-  Abd-  Br/ Gen/ Rectal- Not done, not indicated Extrem- cyanosis- none, clubbing, none, atrophy- none, strength- nl Neuro- grossly intact to observation

## 2012-09-11 ENCOUNTER — Other Ambulatory Visit (HOSPITAL_COMMUNITY): Payer: Managed Care, Other (non HMO)

## 2012-09-11 NOTE — ED Provider Notes (Signed)
Medical screening examination/treatment/procedure(s) were conducted as a shared visit with non-physician practitioner(s) and myself.  I personally evaluated the patient during the encounter Pt with hx allergies/prior allergic rxns presents c/o nausea, hoarseness. No stridor. No throat swelling. No trouble breathing or swallowing. No urticaria or hives. Chest cta.   Suzi Roots, MD 09/11/12 [phone]

## 2012-09-16 NOTE — Assessment & Plan Note (Signed)
We can provide a letter indicating she needs to work where she is is not at risk of exposure to peanuts or peanut products. Her hoarseness today would be for laryngeal edema. We discussed the possibility that if she thinks she is exposed to peanut products, she gets anxious and refluxes. Plan-Depo-Medrol, prednisone 10 mg daily with steroid talk, barium swallow to explore tendency to reflux, Singulair, scheduled return for allergy skin testing for underlying allergic rhinitis.

## 2012-09-16 NOTE — Assessment & Plan Note (Signed)
Plan-letter recommending her work be in an area where she is not exposed to peanuts or peanut products.

## 2012-09-18 ENCOUNTER — Ambulatory Visit (HOSPITAL_COMMUNITY)
Admission: RE | Admit: 2012-09-18 | Discharge: 2012-09-18 | Disposition: A | Discharge status: Home / Self Care | Payer: Managed Care, Other (non HMO) | Source: Ambulatory Visit | Attending: Internal Medicine | Admitting: Internal Medicine

## 2012-09-18 ENCOUNTER — Other Ambulatory Visit (HOSPITAL_COMMUNITY): Payer: Self-pay | Admitting: Specialist

## 2012-09-18 DIAGNOSIS — K219 Gastro-esophageal reflux disease without esophagitis: Secondary | ICD-10-CM

## 2012-09-18 DIAGNOSIS — Z91010 Allergy to peanuts: Secondary | ICD-10-CM | POA: Insufficient documentation

## 2012-09-18 DIAGNOSIS — R491 Aphonia: Secondary | ICD-10-CM | POA: Insufficient documentation

## 2012-09-18 DIAGNOSIS — R490 Dysphonia: Secondary | ICD-10-CM | POA: Insufficient documentation

## 2012-09-21 NOTE — Progress Notes (Signed)
Quick Note:  Pt aware of results. ______ 

## 2012-10-02 ENCOUNTER — Telehealth: Payer: Self-pay | Admitting: Internal Medicine

## 2012-10-02 ENCOUNTER — Ambulatory Visit (INDEPENDENT_AMBULATORY_CARE_PROVIDER_SITE_OTHER): Payer: Managed Care, Other (non HMO) | Admitting: Internal Medicine

## 2012-10-02 DIAGNOSIS — R05 Cough: Secondary | ICD-10-CM

## 2012-10-02 LAB — PULMONARY FUNCTION TEST

## 2012-10-02 NOTE — Progress Notes (Signed)
PFT done today. 

## 2012-10-02 NOTE — Telephone Encounter (Signed)
Spoke with patient, she states some of the information entered on her form was incorrect. I was given number for Boykin Nearing @[phone] Spoke with Joyce Gross and she states that on the form, on page 3 at Question 5 it is marked "no" but instead needs to be marked "yes" and underneath that the "dates of incapacitation". Needs to say 09/06/12. Also Dr. Maple Hudson needs to write an estimated time of leave down.  These corrections can be made on same form just cross through and initial and refax to : Paul Dykes @[phone]  Florentina Addison do you still have this form?

## 2012-10-04 NOTE — Telephone Encounter (Signed)
CY revised the papers as needed and I have faxed back to [employer]. Forms sent to scan in EPIC. Nothing more needed. Will sign off.

## 2012-10-04 NOTE — Telephone Encounter (Signed)
Forms were sent to scan in EPIC;l called and spoke with Zena Amos is re-faxing the papers so CY can make necessary corrections and then fax back.

## 2012-10-08 ENCOUNTER — Telehealth: Payer: Self-pay | Admitting: Internal Medicine

## 2012-10-08 NOTE — Telephone Encounter (Signed)
FMLA just approximates how much time she will miss for doctor visits etc over the year or so. Is she out of work now? When did she go out of work? How long does SHE think she needs to be out of work?

## 2012-10-08 NOTE — Telephone Encounter (Signed)
Called, spoke with pt.   States Dr. Maple Hudson filled out FMLA forms for her but it didn't mention how long she will be out of work.  Pt states she needs a letter stating exactly how long she will be out of work faxed to [employer] at [phone].  Dr. Maple Hudson, can you pls advise on this time frame for letter.  Thank you.

## 2012-10-09 NOTE — Telephone Encounter (Signed)
Called spoke with patient and advised of CY's response below Asked patient is she is out of work now: "[employer] out of work now!  I didn't choose to be out of work."  Advised pt that these questions need to be asked so that we can try to help her with her FMLA. 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  Last ov w/ CY 4.11.14: Patient Instructions    We will give you a letter saying that you are intensely allergic to peanut products and need to work where there is no exposure to peanuts, peanut butter, or people eating peanuts.  Depo 80  Order- Barium swallow for dx esophageal reflux  Script for singulair, 1 daily  Script for prednisone 10 mg daily x 2 weeks  Ok to continue your usual meds  Husband - needs work note saying he had to stay home from work today to care for my patient, his wife.

## 2012-10-10 NOTE — Telephone Encounter (Signed)
Called spoke with patient, advised of CY's recs as stated by CY Pt stated that this was her understanding as well and she thought that her employer was going to work with her on this but something changed (pt could not specify what exactly) Asked pt for a contact per at her work-place >> Elvia Collum @ [phone]  [facility] TCB x1 for Kennon Rounds to see what needs to done to assist pt with this Pt is aware we will continue to work on this and call her w/ updates

## 2012-10-10 NOTE — Telephone Encounter (Signed)
She needs to work where she is not exposed to peanut products, including peanut butter, otherwise she can work. I don't have a basis for keeping her out of work, as long as her work environment can be modified to avoid that exposure- e.g. 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 I think we put that on her FMLA form.  I can make the same point in a letter if that is what she needs.

## 2012-10-10 NOTE — Telephone Encounter (Signed)
Kennon Rounds returned call Kennon Rounds is actually pt's direct supervisor, rather than a HR contact person  Per Kennon Rounds, [place] had to be called 4x on patient with epi pen administration  2 of the 4 times, pt had peanut contamination from indirect contact (a child or parent "breathed on her") Per Kennon Rounds, once the request came to change pt's work area this was taken over by HR >> Antony Contras @ [phone]  [facility] [employer] HR dept, left message w/ Carollee Herter to have [place] call back and ask for triage.

## 2012-10-11 NOTE — Telephone Encounter (Signed)
LMTCBx2 with Elnita Maxwell in HR department to discuss pt. Megan Wilkerson, CMA

## 2012-10-12 ENCOUNTER — Encounter: Payer: Self-pay | Admitting: Internal Medicine

## 2012-10-12 NOTE — Telephone Encounter (Signed)
I called HR department again and LM for Cheryl to call us back on the pt, message left with Aram Beecham. Carron Curie, CMA

## 2012-10-15 NOTE — Telephone Encounter (Signed)
Spoke with Cynthia-states Elnita Maxwell was on the phone at the time of my call-will have her call us back today; I explained we needed a call back to triage today so this matter can be handled best for the patient.

## 2012-10-15 NOTE — Telephone Encounter (Signed)
Florentina Addison do you know if anything further needs to be done with this? We have left 3 messages for HR and they have not called back? Carron Curie, CMA

## 2012-10-16 NOTE — Telephone Encounter (Signed)
ATC Cheryl at [place] busy - Will try back later

## 2012-10-17 ENCOUNTER — Telehealth: Payer: Self-pay | Admitting: Internal Medicine

## 2012-10-17 ENCOUNTER — Encounter: Payer: Self-pay | Admitting: *Deleted

## 2012-10-17 NOTE — Telephone Encounter (Signed)
ATC Megan Wilkerson again (6th time) Per triage protocol will close message - left detailed message for Megan Wilkerson stating that we are trying to get pt's medical leave taken care of and if she could call back ASAP and have a new message generated Called spoke with patient, informed her that we've tried to contact HR multiple times with no return call.  Pt will call her other contact person Megan Wilkerson and have her call the office as well Will go ahead and close

## 2012-10-17 NOTE — Telephone Encounter (Signed)
I have faxed the letter to [facility]. Nothing more needed at this time. Will sign off.

## 2012-10-17 NOTE — Telephone Encounter (Signed)
Spoke with Elnita Maxwell Discussed with her that CY has already done FMLA paperwork for pt, not to keep her out of work but to modify her work environment.  Elnita Maxwell located this and stated that she did not realize this >> the FMLA form expired on 4.10.14, but even so HR needs more details on pt's allergy and how her work environment needs to be modified. Elnita Maxwell understands that pt has a severe allergy and they can place pt in a "peanut safe environment" but if pt's allergy is to where even peanut products being on the clothing of the parents picking up the children, then some additional work will need to be done w/ the employer on a safe work environment.  Per Elnita Maxwell, the forms do not need to be done again.  The extra details can be written on a rx pad or letterhead and can be faxed to [phone].  Per the 5.12.14 phone note: Waymon Budge, MD at 10/10/2012 1:13 PM     She needs to work where she is not exposed to peanut products, including peanut butter, otherwise she can work.  I don't have a basis for keeping her out of work, as long as her work environment can be modified to avoid that exposure- e.g. 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 I think we put that on her FMLA form.  I can make the same point in a letter if that is what she needs.        Dr Maple Hudson please advise, thank you.

## 2012-10-17 NOTE — Telephone Encounter (Signed)
Megan Wilkerson needs to strictly avoid direct contact with peanut products including peanuts, mixed nuts and peanut butter.  If she can work outside [employer] and display a sign saying something like "please- no peanuts or peanut butter in this area" That should be sufficient. She should keep benadryl and an Epipen immediately available for her use.

## 2012-10-17 NOTE — Telephone Encounter (Signed)
Megan Wilkerson returning triage call

## 2012-10-25 ENCOUNTER — Encounter: Payer: Self-pay | Admitting: Internal Medicine

## 2012-10-25 ENCOUNTER — Ambulatory Visit (INDEPENDENT_AMBULATORY_CARE_PROVIDER_SITE_OTHER): Payer: Managed Care, Other (non HMO) | Admitting: Internal Medicine

## 2012-10-25 VITALS — BP 120/76 | HR 113 | Ht 66.0 in | Wt 231.8 lb

## 2012-10-25 DIAGNOSIS — Z5189 Encounter for other specified aftercare: Secondary | ICD-10-CM

## 2012-10-25 DIAGNOSIS — G4733 Obstructive sleep apnea (adult) (pediatric): Secondary | ICD-10-CM

## 2012-10-25 DIAGNOSIS — J309 Allergic rhinitis, unspecified: Secondary | ICD-10-CM

## 2012-10-25 DIAGNOSIS — R490 Dysphonia: Secondary | ICD-10-CM

## 2012-10-25 DIAGNOSIS — T7840XD Allergy, unspecified, subsequent encounter: Secondary | ICD-10-CM

## 2012-10-25 MED ORDER — AZELASTINE-FLUTICASONE 137-50 MCG/ACT NA SUSP: 1.0000 | Freq: Every day | NASAL | Status: DC

## 2012-10-25 NOTE — Patient Instructions (Addendum)
[facility]      Dx OSA  Sample Dymista nasal spray    1-2 puffs each nostril once daily at bedtime  Order- Refer to [facility] ENT  Dx hoarseness

## 2012-10-25 NOTE — Assessment & Plan Note (Signed)
Persistent hoarseness with normal barium swallow. Denies reflux. Sniffing implies post nasal drip. I doubt this is residual from previous allergic exposure. Question voice strain. Plan- ENT referral for laryngoscopy.

## 2012-10-25 NOTE — Assessment & Plan Note (Signed)
Chronic snoring, obesity, daytime fatigue. We discussed possible OSA. Plan Sleep study

## 2012-10-25 NOTE — Assessment & Plan Note (Signed)
Plan ENT referral, sample Dymista nasal spray. Consider allergy vaccine

## 2012-10-25 NOTE — Progress Notes (Signed)
03/16/12- [name] seen in allergy consultation at kind request of  Dr Landry Mellow allergy labs; had reaction to peanuts on Sat-had to use epipen. Husband here. Does not want flu shot. She does not consider herself an allergy person. She  needed Benadryl for urticaria for the first time one month ago. She was given an EpiPen then. On October 12 of this year she got into a car where a friend had been eating peanuts. Her throat began to swell. She was treated with prednisone and used her EpiPen. She says her throat is no better-pain and hoarseness. She has history of GERD but denies asthma or seasonal allergies. No history ENT surgery. CBC- 01/31/12- Eos 5.2 Allergy profiles 01/31/2012-total IgE 912 with specific elevations for many common inhalant allergens and foods. She has been trying to avoid the foods that were listed for her but has not recognized symptoms with any since the exposure to someone eating peanuts.  05/09/12- [name] seen in allergy consultation FOLLOWS FOR: pt reports doing well today, x3 days ago reacted to peanuts at work --SOB, tongue swelling and wheezing used epi pen and symptoms have started improving today She felt fine 3 days ago when she hugged her friend and caught a smell of something. She assumed  friend had been eating peanuts but never asked her. Within a few minutes she began coughing and wheezing with tight throat. She used her EpiPen then went to emergency room. Now taking prednisone as directed. She still has some throat tickle and cough. Skin itches. Taking Zyrtec. Claritin makes her sleepy. Office spirometry-05/09/2012 normal spirometry-FVC 3.34/107%, FEV1 2.56/100%, FEV1/FEC 0.76, FEF 25-75% 2.20/69%.  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 2 weeks ago had conversation with parent to eating peanuts. While talking, her  throat began to tickle, tongue began to swell and throat felt closed. It helped to use EpiPen. One day ago, while working in [employer], same pattern. Again the school nurse had to give EpiPen and she was sent to [place]. She had felt tight in the chest going to work that day and had used rescue inhaler, Symbicort and her daily Allegra. She has been wearing a mask at work. She wants to work away from [employer] where peanut butter is be by the students. She doesn't recognize reflux or heartburn but I discussed this as a possible contribution to the pattern especially if she gets anxious.  10/25/12- [name] seen in allergy consultation FOLLOWS FOR: still raspy in voice; unsure what will happen at work due to allergy. 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 She reports some wheeze. Stays hoarse without painful swallow.Was on prednisone 10 mg daily x 10 days, ending 1 week ago, but took a prednisone last night because of wheezing.  Using azelastine nasal spray. Husband says her chronic snoring is worse. She stays "tired". We discussed possible sleep apnea. Barium swallow was normal. She does not feel reflux.   ROS-see HPI Constitutional:   No-   weight loss, night sweats, fevers, chills, +fatigue, lassitude. HEENT:   No-  headaches, difficulty swallowing, tooth/dental problems, sore throat, +hoarseness      No-  sneezing, itching, no- ear ache, nasal congestion, post nasal drip,  CV:  No-   chest pain, orthopnea, PND, swelling in lower extremities, anasarca,  dizziness, palpitations Resp: No-   shortness of breath with exertion or at rest.              No-   productive cough,  + non-productive cough,  No- coughing up of blood.              No-   change in color of mucus.  No- wheezing.   Skin: No-   rash or lesions. GI:  +  heartburn, indigestion, abdominal pain, nausea, vomiting,  GU: n. MS:  No-   joint pain or swelling.    Neuro-     nothing unusual Psych:  No- change in mood or affect. No depression or anxiety.  No memory loss.  OBJ- Physical Exam General- Alert, Oriented, Affect-appropriate, Distress- none acute Skin- rash-none, lesions- none, excoriation- none Lymphadenopathy- none Head- atraumatic            Eyes- Gross vision intact, PERRLA, conjunctivae and secretions clear            Ears- Hearing, canals-normal            Nose- + sniffing, no-Septal dev, mucus, polyps, erosion, perforation             Throat- Mallampati III , mucosa clear , drainage- none, tonsils- atrophic, +hoarse Neck- flexible , trachea midline, no stridor , thyroid nl, carotid no bruit Chest - symmetrical excursion , unlabored           Heart/CV- RRR , no murmur , no gallop  , no rub, nl s1 s2                           - JVD- none , edema- none, stasis changes- none, varices- none           Lung- clear to P&A, No- cough, dullness-none, rub- none           Chest wall-  Abd-  Br/ Gen/ Rectal- Not done, not indicated Extrem- cyanosis- none, clubbing, none, atrophy- none, strength- nl Neuro- grossly intact to observation

## 2012-10-25 NOTE — Assessment & Plan Note (Signed)
No recent events. Avoiding all peanut products may require job accomodation.

## 2012-10-28 ENCOUNTER — Encounter (HOSPITAL_BASED_OUTPATIENT_CLINIC_OR_DEPARTMENT_OTHER): Payer: Managed Care, Other (non HMO)

## 2012-10-29 ENCOUNTER — Encounter (HOSPITAL_BASED_OUTPATIENT_CLINIC_OR_DEPARTMENT_OTHER): Payer: Managed Care, Other (non HMO)

## 2012-10-30 ENCOUNTER — Telehealth: Payer: Self-pay | Admitting: Internal Medicine

## 2012-10-30 NOTE — Telephone Encounter (Signed)
No need for message. °

## 2012-10-31 ENCOUNTER — Ambulatory Visit (INDEPENDENT_AMBULATORY_CARE_PROVIDER_SITE_OTHER): Payer: Managed Care, Other (non HMO) | Admitting: Internal Medicine

## 2012-10-31 ENCOUNTER — Encounter: Payer: Self-pay | Admitting: Internal Medicine

## 2012-10-31 VITALS — BP 124/64 | HR 85 | Ht 66.0 in | Wt 232.4 lb

## 2012-10-31 DIAGNOSIS — T7840XD Allergy, unspecified, subsequent encounter: Secondary | ICD-10-CM

## 2012-10-31 DIAGNOSIS — Z91010 Allergy to peanuts: Secondary | ICD-10-CM

## 2012-10-31 DIAGNOSIS — J309 Allergic rhinitis, unspecified: Secondary | ICD-10-CM

## 2012-10-31 DIAGNOSIS — Z5189 Encounter for other specified aftercare: Secondary | ICD-10-CM

## 2012-10-31 DIAGNOSIS — R490 Dysphonia: Secondary | ICD-10-CM

## 2012-10-31 DIAGNOSIS — G4733 Obstructive sleep apnea (adult) (pediatric): Secondary | ICD-10-CM

## 2012-10-31 MED ORDER — AZELASTINE-FLUTICASONE 137-50 MCG/ACT NA SUSP: 2.0000 | Freq: Every day | NASAL | Status: DC

## 2012-10-31 NOTE — Patient Instructions (Addendum)
Sample Dymista    Continue 1-2 puffs each nostril once daily at bedtime  Keep appointment with Dr Annalee Genta ENT  Keep appointment for Sleep Study  Continue Pepcid twice daily

## 2012-10-31 NOTE — Progress Notes (Signed)
03/16/12- [name] seen in allergy consultation at kind request of  Dr Landry Mellow allergy labs; had reaction to peanuts on Sat-had to use epipen. Husband here. Does not want flu shot. She does not consider herself an allergy person. She  needed Benadryl for urticaria for the first time one month ago. She was given an EpiPen then. On October 12 of this year she got into a car where a friend had been eating peanuts. Her throat began to swell. She was treated with prednisone and used her EpiPen. She says her throat is no better-pain and hoarseness. She has history of GERD but denies asthma or seasonal allergies. No history ENT surgery. CBC- 01/31/12- Eos 5.2 Allergy profiles 01/31/2012-total IgE 912 with specific elevations for many common inhalant allergens and foods. She has been trying to avoid the foods that were listed for her but has not recognized symptoms with any since the exposure to someone eating peanuts.  05/09/12- [name] seen in allergy consultation FOLLOWS FOR: pt reports doing well today, x3 days ago reacted to peanuts at work --SOB, tongue swelling and wheezing used epi pen and symptoms have started improving today She felt fine 3 days ago when she hugged her friend and caught a smell of something. She assumed  friend had been eating peanuts but never asked her. Within a few minutes she began coughing and wheezing with tight throat. She used her EpiPen then went to emergency room. Now taking prednisone as directed. She still has some throat tickle and cough. Skin itches. Taking Zyrtec. Claritin makes her sleepy. Office spirometry-05/09/2012 normal spirometry-FVC 3.34/107%, FEV1 2.56/100%, FEV1/FEC 0.76, FEF 25-75% 2.20/69%.  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 2 weeks ago had conversation with parent to eating peanuts. While talking, her  throat began to tickle, tongue began to swell and throat felt closed. It helped to use EpiPen. One day ago, while working in [employer], same pattern. Again the school nurse had to give EpiPen and she was sent to [place]. She had felt tight in the chest going to work that day and had used rescue inhaler, Symbicort and her daily Allegra. She has been wearing a mask at work. She wants to work away from [employer] where peanut butter is be by the students. She doesn't recognize reflux or heartburn but I discussed this as a possible contribution to the pattern especially if she gets anxious.  10/25/12- [name] seen in allergy consultation FOLLOWS FOR: still raspy in voice; unsure what will happen at work due to allergy. 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 She reports some wheeze. Stays hoarse without painful swallow.Was on prednisone 10 mg daily x 10 days, ending 1 week ago, but took a prednisone last night because of wheezing.  Using azelastine nasal spray. Husband says her chronic snoring is worse. She stays "tired". We discussed possible sleep apnea. Barium swallow was normal. She does not feel reflux.   10/31/12 [name] followed for allergic rhinitis, food allergy(peanut) No antihistamines, OTC  cough syrups, or OTC sleep aids x 3 days EXCEPT- Dymista last night, 17 hours ago. Dymista has helped some- less stuffy and less drainage, but no change in hoarseness. She continues Pepcid twice daily with no awareness of reflux. Trying to wear a mask outdoors. No  acute events/ peanut exposures since last here. Pending now for ENT evaluation for hoarseness and NPSG for snoring and tiredness.  Allergy Skin Test  10/31/12- Significant Positive for grass, weed tree pollens, dust mite, given allegra at end.  ROS-see HPI Constitutional:   No-   weight loss, night sweats, fevers, chills, +fatigue, lassitude. HEENT:   No-   headaches, difficulty swallowing, tooth/dental problems, sore throat, +hoarseness      No-  sneezing, itching, no- ear ache, +nasal congestion, +post nasal drip,  CV:  No-   chest pain, orthopnea, PND, swelling in lower extremities, anasarca,  dizziness, palpitations Resp: No-   shortness of breath with exertion or at rest.              No-   productive cough,  + non-productive cough,  No- coughing up of blood.              No-   change in color of mucus.  No- wheezing.   Skin: No-   rash or lesions. GI:  +  heartburn, indigestion, abdominal pain, nausea, vomiting,  GU: n. MS:  No-   joint pain or swelling.   Neuro-     nothing unusual Psych:  No- change in mood or affect. No depression or anxiety.  No memory loss.  OBJ- Physical Exam General- Alert, Oriented, Affect-appropriate, Distress- none acute Skin- rash-none, lesions- none, excoriation- none Lymphadenopathy- none Head- atraumatic            Eyes- Gross vision intact, PERRLA, conjunctivae and secretions clear            Ears- Hearing, canals-normal            Nose- + sniffing, no-Septal dev, mucus, polyps, erosion, perforation             Throat- Mallampati III , mucosa clear , drainage- none, tonsils- atrophic, +hoarse Neck- flexible , trachea midline, no stridor , thyroid nl, carotid no bruit Chest - symmetrical excursion , unlabored           Heart/CV- RRR , no murmur , no gallop  , no rub, nl s1 s2                           - JVD- none , edema- none, stasis changes- none, varices- none           Lung- clear to P&A, No- cough, dullness-none, rub- none           Chest wall-  Abd-  Br/ Gen/ Rectal- Not done, not indicated Extrem- cyanosis- none, clubbing, none, atrophy- none, strength- nl Neuro- grossly intact to observation

## 2012-11-01 ENCOUNTER — Encounter: Payer: Self-pay | Admitting: Internal Medicine

## 2012-11-01 NOTE — Assessment & Plan Note (Signed)
Pending ENT evaluation for persistent hoarseness. She denies reflux and continues BID Pepcid.

## 2012-11-01 NOTE — Assessment & Plan Note (Signed)
Managed with avoidance.  Watch for components of anxiety/ panic, reflux induced hoarseness and possibilities.

## 2012-11-01 NOTE — Assessment & Plan Note (Signed)
Environmental precautions and symptomatic therapies. She is not eager to do allergy shots, but also reluctant to consider oral immunotherapies. She will have to decide hat she wants to do.  Wait on allergy vaccine, at least until she sees Dr Annalee Genta about her hoarseness.

## 2012-11-01 NOTE — Assessment & Plan Note (Signed)
No recent events.

## 2012-11-01 NOTE — Assessment & Plan Note (Signed)
Continues tiredness and snoring. Pending NPSG for possible OSA

## 2012-11-05 ENCOUNTER — Telehealth: Payer: Self-pay | Admitting: Internal Medicine

## 2012-11-05 DIAGNOSIS — G4733 Obstructive sleep apnea (adult) (pediatric): Secondary | ICD-10-CM

## 2012-11-05 NOTE — Telephone Encounter (Signed)
Order was sent to [facility] 

## 2012-11-05 NOTE — Telephone Encounter (Signed)
Please advise if okay to order Dr. Maple Hudson thanks Last OV 10/31/12 Pending 12/14/12

## 2012-11-05 NOTE — Telephone Encounter (Signed)
Ok to d/c order for NPSG and instead order unattended home sleep study for dx osa through our [facility]  Dx OSA

## 2012-11-12 ENCOUNTER — Encounter: Payer: Self-pay | Admitting: Internal Medicine

## 2012-11-14 ENCOUNTER — Ambulatory Visit: Payer: Managed Care, Other (non HMO) | Admitting: Internal Medicine

## 2012-11-14 DIAGNOSIS — G4710 Hypersomnia, unspecified: Secondary | ICD-10-CM

## 2012-11-14 DIAGNOSIS — G4733 Obstructive sleep apnea (adult) (pediatric): Secondary | ICD-10-CM

## 2012-11-14 DIAGNOSIS — R0609 Other forms of dyspnea: Secondary | ICD-10-CM

## 2012-11-28 DIAGNOSIS — G4733 Obstructive sleep apnea (adult) (pediatric): Secondary | ICD-10-CM

## 2012-12-07 ENCOUNTER — Encounter (HOSPITAL_BASED_OUTPATIENT_CLINIC_OR_DEPARTMENT_OTHER): Payer: Managed Care, Other (non HMO)

## 2012-12-14 ENCOUNTER — Ambulatory Visit (INDEPENDENT_AMBULATORY_CARE_PROVIDER_SITE_OTHER): Payer: Managed Care, Other (non HMO) | Admitting: Internal Medicine

## 2012-12-14 ENCOUNTER — Ambulatory Visit (INDEPENDENT_AMBULATORY_CARE_PROVIDER_SITE_OTHER)
Admission: RE | Admit: 2012-12-14 | Discharge: 2012-12-14 | Disposition: A | Discharge status: Home / Self Care | Payer: Managed Care, Other (non HMO) | Source: Ambulatory Visit | Attending: Internal Medicine | Admitting: Internal Medicine

## 2012-12-14 ENCOUNTER — Encounter: Payer: Self-pay | Admitting: Internal Medicine

## 2012-12-14 VITALS — BP 120/88 | HR 90 | Ht 66.0 in | Wt 238.2 lb

## 2012-12-14 DIAGNOSIS — J309 Allergic rhinitis, unspecified: Secondary | ICD-10-CM

## 2012-12-14 DIAGNOSIS — R05 Cough: Secondary | ICD-10-CM

## 2012-12-14 DIAGNOSIS — K219 Gastro-esophageal reflux disease without esophagitis: Secondary | ICD-10-CM

## 2012-12-14 DIAGNOSIS — G4733 Obstructive sleep apnea (adult) (pediatric): Secondary | ICD-10-CM

## 2012-12-14 MED ORDER — METHYLPREDNISOLONE ACETATE 80 MG/ML IJ SUSP
80.0000 mg | Freq: Once | INTRAMUSCULAR | Status: AC
  Administered 2012-12-14: 80 mg via INTRAMUSCULAR

## 2012-12-14 NOTE — Progress Notes (Signed)
03/16/12- [name] seen in allergy consultation at kind request of  Dr Landry Mellow allergy labs; had reaction to peanuts on Sat-had to use epipen. Husband here. Does not want flu shot. She does not consider herself an allergy person. She  needed Benadryl for urticaria for the first time one month ago. She was given an EpiPen then. On October 12 of this year she got into a car where a friend had been eating peanuts. Her throat began to swell. She was treated with prednisone and used her EpiPen. She says her throat is no better-pain and hoarseness. She has history of GERD but denies asthma or seasonal allergies. No history ENT surgery. CBC- 01/31/12- Eos 5.2 Allergy profiles 01/31/2012-total IgE 912 with specific elevations for many common inhalant allergens and foods. She has been trying to avoid the foods that were listed for her but has not recognized symptoms with any since the exposure to someone eating peanuts.  05/09/12- [name] seen in allergy consultation FOLLOWS FOR: pt reports doing well today, x3 days ago reacted to peanuts at work --SOB, tongue swelling and wheezing used epi pen and symptoms have started improving today She felt fine 3 days ago when she hugged her friend and caught a smell of something. She assumed  friend had been eating peanuts but never asked her. Within a few minutes she began coughing and wheezing with tight throat. She used her EpiPen then went to emergency room. Now taking prednisone as directed. She still has some throat tickle and cough. Skin itches. Taking Zyrtec. Claritin makes her sleepy. Office spirometry-05/09/2012 normal spirometry-FVC 3.34/107%, FEV1 2.56/100%, FEV1/FEC 0.76, FEF 25-75% 2.20/69%.  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 2 weeks ago had conversation with parent to eating peanuts. While talking, her  throat began to tickle, tongue began to swell and throat felt closed. It helped to use EpiPen. One day ago, while working in [employer], same pattern. Again the school nurse had to give EpiPen and she was sent to [place]. She had felt tight in the chest going to work that day and had used rescue inhaler, Symbicort and her daily Allegra. She has been wearing a mask at work. She wants to work away from [employer] where peanut butter is be by the students. She doesn't recognize reflux or heartburn but I discussed this as a possible contribution to the pattern especially if she gets anxious.  10/25/12- [name] seen in allergy consultation FOLLOWS FOR: still raspy in voice; unsure what will happen at work due to allergy. 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 She reports some wheeze. Stays hoarse without painful swallow.Was on prednisone 10 mg daily x 10 days, ending 1 week ago, but took a prednisone last night because of wheezing.  Using azelastine nasal spray. Husband says her chronic snoring is worse. She stays "tired". We discussed possible sleep apnea. Barium swallow was normal. She does not feel reflux.   10/31/12 [name] followed for allergic rhinitis, food allergy(peanut) No antihistamines, OTC  cough syrups, or OTC sleep aids x 3 days EXCEPT- Dymista last night, 17 hours ago. Dymista has helped some- less stuffy and less drainage, but no change in hoarseness. She continues Pepcid twice daily with no awareness of reflux. Trying to wear a mask outdoors. No  acute events/ peanut exposures since last here. Pending now for ENT evaluation for hoarseness and NPSG for snoring and tiredness.  Allergy Skin Test  10/31/12- Significant Positive for grass, weed tree pollens, dust mite, given allegra at end.  12/14/12- [name] followed for allergic rhinitis, food allergy(peanut) 6 wk follow up.  Symptoms are  unchanged.  Has hoarseness, prod cough with green mucus, a little wheezing, itchy eyes, runny nose.   Dr. Stasia Cavalier gave her omeprazole to use at bedtime. Barium swallow was negative and she is not aware of reflux. Complains of head congestion, dizziness and itching in throat. Unclear about the effect of weather changes. Occasional green on some days. Feels postnasal drip despite Dymista nasal spray. 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 Weight 232 pounds.  ROS-see HPI Constitutional:   No-   weight loss, night sweats, fevers, chills, +fatigue, lassitude. HEENT:   No-  headaches, difficulty swallowing, tooth/dental problems, sore throat, +hoarseness      No-  sneezing, itching, no- ear ache, +nasal congestion, +post nasal drip,  CV:  No-   chest pain, orthopnea, PND, swelling in lower extremities, anasarca,  dizziness, palpitations Resp: No-   shortness of breath with exertion or at rest.              No-   productive cough,  + non-productive cough,  No- coughing up of blood.              + change in color of mucus.  No- wheezing.   Skin: No-   rash or lesions. GI:  +  heartburn, indigestion, abdominal pain, nausea, vomiting,  GU: n. MS:  No-   joint pain or swelling.   Neuro-     nothing unusual Psych:  No- change in mood or affect. No depression or anxiety.  No memory loss.  OBJ- Physical Exam General- Alert, Oriented, Affect-appropriate, Distress- none acute Skin- rash-none, lesions- none, excoriation- none Lymphadenopathy- none Head- atraumatic            Eyes- Gross vision intact, PERRLA, conjunctivae and secretions clear            Ears- Hearing, canals-normal            Nose- + sniffing, no-Septal dev, mucus, polyps, erosion, perforation             Throat- Mallampati III-IV , mucosa clear , drainage- none, tonsils- atrophic, +hoarse Neck- flexible , trachea midline, no stridor , thyroid nl, carotid no  bruit Chest - symmetrical excursion , unlabored           Heart/CV- RRR , no murmur , no gallop  , no rub, nl s1 s2                           - JVD- none , edema- none, stasis changes- none, varices- none           Lung- clear to P&A, +dry hacking cough, dullness-none, rub- none           Chest wall-  Abd-  Br/ Gen/ Rectal- Not done, not indicated Extrem- cyanosis- none, clubbing, none, atrophy- none, strength- nl Neuro- grossly intact to observation

## 2012-12-14 NOTE — Patient Instructions (Addendum)
Order- CXR    Chronic cough  Depo 80  Add Sudafed -PE (otc)  Each morning for awhile as a decongestant  Try using the Dymista nasal spray    1 puff each nostril twice daily  Have your husband help you put a brick under each head leg of the bed, to tilt the bed frame up a little. That will help reduce stomach acid reflux into your throat while you sleep.  We will try these first, and talk about a trial of CPAP next time.

## 2012-12-30 NOTE — Assessment & Plan Note (Signed)
Chronic allergic rhinitis, possibility of acute sinus infection, and GERD creating cyclical cough Plan-Dymista nasal spray add Sudafed PE, Depo-Medrol

## 2012-12-30 NOTE — Assessment & Plan Note (Signed)
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 Weight 232 pounds. Plan-related treat her sinus complaints first. On return we will try CPAP.

## 2012-12-30 NOTE — Assessment & Plan Note (Signed)
Probably cyclical cough with postnasal drip and reflux. Plan-treating rhinitis and reflux separately. Chest x-ray

## 2012-12-30 NOTE — Assessment & Plan Note (Signed)
Plan-reflux precautions including elevating head of bed, no food or drink for 2 hours before lying down, omeprazole.

## 2013-02-11 ENCOUNTER — Ambulatory Visit (INDEPENDENT_AMBULATORY_CARE_PROVIDER_SITE_OTHER): Payer: Managed Care, Other (non HMO) | Admitting: Internal Medicine

## 2013-02-11 ENCOUNTER — Encounter: Payer: Self-pay | Admitting: Internal Medicine

## 2013-02-11 VITALS — BP 130/70 | HR 97 | Ht 66.0 in | Wt 231.8 lb

## 2013-02-11 DIAGNOSIS — R490 Dysphonia: Secondary | ICD-10-CM

## 2013-02-11 DIAGNOSIS — K219 Gastro-esophageal reflux disease without esophagitis: Secondary | ICD-10-CM

## 2013-02-11 DIAGNOSIS — R05 Cough: Secondary | ICD-10-CM

## 2013-02-11 DIAGNOSIS — Z91010 Allergy to peanuts: Secondary | ICD-10-CM

## 2013-02-11 DIAGNOSIS — G4733 Obstructive sleep apnea (adult) (pediatric): Secondary | ICD-10-CM

## 2013-02-11 DIAGNOSIS — R55 Syncope and collapse: Secondary | ICD-10-CM

## 2013-02-11 MED ORDER — AEROCHAMBER MV MISC: Status: DC

## 2013-02-11 MED ORDER — OMEPRAZOLE 40 MG PO CPDR: 40.0000 mg | DELAYED_RELEASE_CAPSULE | Freq: Every day | ORAL | Status: DC

## 2013-02-11 NOTE — Progress Notes (Signed)
03/16/12- [name] seen in allergy consultation at kind request of  Dr Landry Mellow allergy labs; had reaction to peanuts on Sat-had to use epipen. Husband here. Does not want flu shot. She does not consider herself an allergy person. She  needed Benadryl for urticaria for the first time one month ago. She was given an EpiPen then. On October 12 of this year she got into a car where a friend had been eating peanuts. Her throat began to swell. She was treated with prednisone and used her EpiPen. She says her throat is no better-pain and hoarseness. She has history of GERD but denies asthma or seasonal allergies. No history ENT surgery. CBC- 01/31/12- Eos 5.2 Allergy profiles 01/31/2012-total IgE 912 with specific elevations for many common inhalant allergens and foods. She has been trying to avoid the foods that were listed for her but has not recognized symptoms with any since the exposure to someone eating peanuts.  05/09/12- [name] seen in allergy consultation FOLLOWS FOR: pt reports doing well today, x3 days ago reacted to peanuts at work --SOB, tongue swelling and wheezing used epi pen and symptoms have started improving today She felt fine 3 days ago when she hugged her friend and caught a smell of something. She assumed  friend had been eating peanuts but never asked her. Within a few minutes she began coughing and wheezing with tight throat. She used her EpiPen then went to emergency room. Now taking prednisone as directed. She still has some throat tickle and cough. Skin itches. Taking Zyrtec. Claritin makes her sleepy. Office spirometry-05/09/2012 normal spirometry-FVC 3.34/107%, FEV1 2.56/100%, FEV1/FEC 0.76, FEF 25-75% 2.20/69%.  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 2 weeks ago had conversation with parent to eating peanuts. While talking, her  throat began to tickle, tongue began to swell and throat felt closed. It helped to use EpiPen. One day ago, while working in [employer], same pattern. Again the school nurse had to give EpiPen and she was sent to [place]. She had felt tight in the chest going to work that day and had used rescue inhaler, Symbicort and her daily Allegra. She has been wearing a mask at work. She wants to work away from [employer] where peanut butter is be by the students. She doesn't recognize reflux or heartburn but I discussed this as a possible contribution to the pattern especially if she gets anxious.  10/25/12- [name] seen in allergy consultation FOLLOWS FOR: still raspy in voice; unsure what will happen at work due to allergy. 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 She reports some wheeze. Stays hoarse without painful swallow.Was on prednisone 10 mg daily x 10 days, ending 1 week ago, but took a prednisone last night because of wheezing.  Using azelastine nasal spray. Husband says her chronic snoring is worse. She stays "tired". We discussed possible sleep apnea. Barium swallow was normal. She does not feel reflux.   10/31/12 [name] followed for allergic rhinitis, food allergy(peanut) No antihistamines, OTC  cough syrups, or OTC sleep aids x 3 days EXCEPT- Dymista last night, 17 hours ago. Dymista has helped some- less stuffy and less drainage, but no change in hoarseness. She continues Pepcid twice daily with no awareness of reflux. Trying to wear a mask outdoors. No  acute events/ peanut exposures since last here. Pending now for ENT evaluation for hoarseness and NPSG for snoring and tiredness.  Allergy Skin Test  10/31/12- Significant Positive for grass, weed tree pollens, dust mite, given allegra at end.  12/14/12- [name] followed for allergic rhinitis, food allergy(peanut) 6 wk follow up.  Symptoms are  unchanged.  Has hoarseness, prod cough with green mucus, a little wheezing, itchy eyes, runny nose.   Dr. Stasia Cavalier gave her omeprazole to use at bedtime. Barium swallow was negative and she is not aware of reflux. Complains of head congestion, dizziness and itching in throat. Unclear about the effect of weather changes. Occasional green on some days. Feels postnasal drip despite Dymista nasal spray. 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 Weight 232 pounds.  02/11/13- [name] followed for allergic rhinitis, food allergy(peanut), OSA FOLLOWS FOR: almost passed out in yard-was taking the trash out and blacked out(june 2014);Pt states that she is unable to work due to this. Dr Annalee Genta ENT attributed hoarseness to GERD, Rx'd omeprazole. Describes 2 episodes syncope w/o HA, palpitation, trauma. Woke spontaneously. Did not seek attention. Notes wheeze if bending over for housework. Continues Symbicort, proventil. We will want to try CPAP when she is able to cope.  ROS-see HPI Constitutional:   No-   weight loss, night sweats, fevers, chills, +fatigue, lassitude. HEENT:   No-  headaches, difficulty swallowing, tooth/dental problems, sore throat, +hoarseness      No-  sneezing, itching, no- ear ache, +nasal congestion, +post nasal drip,  CV:  No-   chest pain, orthopnea, PND, swelling in lower extremities, anasarca,  dizziness, palpitations Resp: No-   shortness of breath with exertion or at rest.              No-   productive cough,  + non-productive cough,  No- coughing up of blood.              No-change in color of mucus.  N+ wheezing.   Skin: No-   rash or lesions. GI:  +  heartburn, indigestion, abdominal pain, nausea, vomiting,  GU: n. MS:  No-   joint pain or swelling.   Neuro-  +syncope Psych:  No- change in mood or affect. No depression or anxiety.  No memory loss.  OBJ- Physical Exam General-  Alert, Oriented, Affect-appropriate, Distress- none acute Skin- rash-none, lesions- none, excoriation- none Lymphadenopathy- none Head- atraumatic            Eyes- Gross vision intact, PERRLA, conjunctivae and secretions clear            Ears- Hearing, canals-normal            Nose- + sniffing, no-Septal dev, mucus, polyps, erosion, perforation             Throat- Mallampati III-IV , mucosa clear , drainage- none, tonsils- atrophic, +hoarse unchanged Neck- flexible , trachea midline, no stridor , thyroid nl, carotid no bruit Chest - symmetrical excursion , unlabored           Heart/CV- RRR , no murmur , no gallop  , no rub, nl s1 s2                           - JVD- none , edema- none, stasis changes- none, varices- none           Lung-  clear to P&A, cough-none, dullness-none, rub- none           Chest wall-  Abd-  Br/ Gen/ Rectal- Not done, not indicated Extrem- cyanosis- none, clubbing, none, atrophy- none, strength- nl Neuro- grossly intact to observation

## 2013-02-11 NOTE — Assessment & Plan Note (Signed)
No recent exposure events

## 2013-02-11 NOTE — Assessment & Plan Note (Signed)
She doesn't recognize reflux events. Says sh eis taking omeprazole. Little change.

## 2013-02-11 NOTE — Assessment & Plan Note (Signed)
Reports 2 episodes of syncope w/o trauma. Did not seek medical attention. I am getting sense of "hopeless/ helpless". Organic basis unclear. Have asked her to discuss w/  Dr Felicity Coyer, anticipating may want w/u. Watch for vasovagal, hyperventilation, orthostasis or PAF.

## 2013-02-11 NOTE — Assessment & Plan Note (Signed)
Still anticipate trial of CPAP

## 2013-02-11 NOTE — Patient Instructions (Addendum)
Don't forget to get that flu shot this fall !  Aerochamber spacer tube to use with your Symbicort- see if this reduces your hoarseness  Continue with the omeprazole  Apt Dr Felicity Coyer about your passing-out spells. She may want you to have some cardiology tests.

## 2013-02-11 NOTE — Assessment & Plan Note (Signed)
Less cough, unless her syncope is tussive

## 2013-02-11 NOTE — Assessment & Plan Note (Signed)
No change hoarseness so far Plan- continue omeprazole. Voice rest, watch for reflux

## 2013-05-10 ENCOUNTER — Ambulatory Visit (INDEPENDENT_AMBULATORY_CARE_PROVIDER_SITE_OTHER): Payer: Managed Care, Other (non HMO) | Admitting: Nurse Practitioner

## 2013-05-10 ENCOUNTER — Encounter: Payer: Self-pay | Admitting: Nurse Practitioner

## 2013-05-10 VITALS — BP 110/72 | HR 90 | Temp 97.7°F | Wt 233.0 lb

## 2013-05-10 DIAGNOSIS — J069 Acute upper respiratory infection, unspecified: Secondary | ICD-10-CM

## 2013-05-10 DIAGNOSIS — H6590 Unspecified nonsuppurative otitis media, unspecified ear: Secondary | ICD-10-CM

## 2013-05-10 DIAGNOSIS — H6593 Unspecified nonsuppurative otitis media, bilateral: Secondary | ICD-10-CM

## 2013-05-10 NOTE — Progress Notes (Signed)
Subjective:    Patient ID: Megan Wilkerson, female    DOB: [birth year], 47 y.o.   MRN: [id number]  URI  This is a new problem. The current episode started 1 to 4 weeks ago (1 wk). The problem has been waxing and waning. There has been no fever. Associated symptoms include congestion, coughing, headaches, a plugged ear sensation and wheezing. Pertinent negatives include no abdominal pain, chest pain, diarrhea, nausea, sinus pain, sore throat or vomiting. Treatments tried: OTC allergy med. using albuterol twice daily. The treatment provided no relief.      Review of Systems  Constitutional: Positive for chills and fatigue. Negative for fever, activity change and appetite change.  HENT: Positive for congestion and postnasal drip. Negative for sore throat.   Respiratory: Positive for cough, chest tightness and wheezing. Negative for shortness of breath.   Cardiovascular: Negative for chest pain.  Gastrointestinal: Negative for nausea, vomiting, abdominal pain and diarrhea.  Musculoskeletal: Negative for back pain.  Neurological: Positive for headaches.       Objective:   Physical Exam  Vitals reviewed. Constitutional: She is oriented to person, place, and time. She appears well-developed and well-nourished. No distress.  HENT:  Head: Normocephalic and atraumatic.  Right Ear: External ear normal. Tympanic membrane is not retracted and not bulging. A middle ear effusion is present.  Left Ear: External ear normal. Tympanic membrane is not retracted and not bulging. A middle ear effusion is present.  Mouth/Throat: Oropharynx is clear and moist. No oropharyngeal exudate.  Eyes: Conjunctivae are normal. Right eye exhibits no discharge. Left eye exhibits no discharge.  Neck: Normal range of motion. Neck supple. No thyromegaly present.  Cardiovascular: Normal rate, regular rhythm and normal heart sounds.   No murmur heard. Pulmonary/Chest: Effort normal and breath sounds normal. No  respiratory distress. She has no wheezes.  Lymphadenopathy:    She has no cervical adenopathy.  Neurological: She is alert and oriented to person, place, and time.  Skin: Skin is warm and dry.  Psychiatric: She has a normal mood and affect. Her behavior is normal. Thought content normal.          Assessment & Plan:  1. Viral upper respiratory illness Symptom management. See instructions.  2. Otitis media with effusion, bilateral Daily flonase for at least 10 consecutive days. See instructions.

## 2013-05-10 NOTE — Patient Instructions (Signed)
You have a virus causing your symptoms. The average duration of cold symptoms is 14 days. Start daily sinus rinses (neilmed Sinus Rinse). You may add 2-3 drops affrin nasal spray to solution FOR 3 DAYS ONLY. Then continue saline solution daily.  Resume flonase-1 spray each nostril twice daily for at least 10 days to help clear fluid in ears. Sip fluids every hour. Rest. If you are not feeling better in 1 week or develop fever or chest pain, call us for re-evaluation. Feel better!  Upper Respiratory Infection, Adult An upper respiratory infection (URI) is also sometimes known as the common cold. The upper respiratory tract includes the nose, sinuses, throat, trachea, and bronchi. Bronchi are the airways leading to the lungs. Most people improve within 1 week, but symptoms can last up to 2 weeks. A residual cough may last even longer.  CAUSES Many different viruses can infect the tissues lining the upper respiratory tract. The tissues become irritated and inflamed and often become very moist. Mucus production is also common. A cold is contagious. You can easily spread the virus to others by oral contact. This includes kissing, sharing a glass, coughing, or sneezing. Touching your mouth or nose and then touching a surface, which is then touched by another person, can also spread the virus. SYMPTOMS  Symptoms typically develop 1 to 3 days after you come in contact with a cold virus. Symptoms vary from person to person. They may include:  Runny nose.  Sneezing.  Nasal congestion.  Sinus irritation.  Sore throat.  Loss of voice (laryngitis).  Cough.  Fatigue.  Muscle aches.  Loss of appetite.  Headache.  Low-grade fever. DIAGNOSIS  You might diagnose your own cold based on familiar symptoms, since most people get a cold 2 to 3 times a year. Your caregiver can confirm this based on your exam. Most importantly, your caregiver can check that your symptoms are not due to another disease such  as strep throat, sinusitis, pneumonia, asthma, or epiglottitis. Blood tests, throat tests, and X-rays are not necessary to diagnose a common cold, but they may sometimes be helpful in excluding other more serious diseases. Your caregiver will decide if any further tests are required. RISKS AND COMPLICATIONS  You may be at risk for a more severe case of the common cold if you smoke cigarettes, have chronic heart disease (such as heart failure) or lung disease (such as asthma), or if you have a weakened immune system. The very young and very old are also at risk for more serious infections. Bacterial sinusitis, middle ear infections, and bacterial pneumonia can complicate the common cold. The common cold can worsen asthma and chronic obstructive pulmonary disease (COPD). Sometimes, these complications can require emergency medical care and may be life-threatening. PREVENTION  The best way to protect against getting a cold is to practice good hygiene. Avoid oral or hand contact with people with cold symptoms. Wash your hands often if contact occurs. There is no clear evidence that vitamin C, vitamin E, echinacea, or exercise reduces the chance of developing a cold. However, it is always recommended to get plenty of rest and practice good nutrition. TREATMENT  Treatment is directed at relieving symptoms. There is no cure. Antibiotics are not effective, because the infection is caused by a virus, not by bacteria. Treatment may include:  Increased fluid intake. Sports drinks offer valuable electrolytes, sugars, and fluids.  Breathing heated mist or steam (vaporizer or shower).  Eating chicken soup or other clear broths, and  maintaining good nutrition.  Getting plenty of rest.  Using gargles or lozenges for comfort.  Controlling fevers with ibuprofen or acetaminophen as directed by your caregiver.  Increasing usage of your inhaler if you have asthma. Zinc gel and zinc lozenges, taken in the first 24  hours of the common cold, can shorten the duration and lessen the severity of symptoms. Pain medicines may help with fever, muscle aches, and throat pain. A variety of non-prescription medicines are available to treat congestion and runny nose. Your caregiver can make recommendations and may suggest nasal or lung inhalers for other symptoms.  HOME CARE INSTRUCTIONS   Only take over-the-counter or prescription medicines for pain, discomfort, or fever as directed by your caregiver.  Use a warm mist humidifier or inhale steam from a shower to increase air moisture. This may keep secretions moist and make it easier to breathe.  Drink enough water and fluids to keep your urine clear or pale yellow.  Rest as needed.  Return to work when your temperature has returned to normal or as your caregiver advises. You may need to stay home longer to avoid infecting others. You can also use a face mask and careful hand washing to prevent spread of the virus. SEEK MEDICAL CARE IF:   After the first few days, you feel you are getting worse rather than better.  You need your caregiver's advice about medicines to control symptoms.  You develop chills, worsening shortness of breath, or brown or red sputum. These may be signs of pneumonia.  You develop yellow or brown nasal discharge or pain in the face, especially when you bend forward. These may be signs of sinusitis.  You develop a fever, swollen neck glands, pain with swallowing, or white areas in the back of your throat. These may be signs of strep throat. SEEK IMMEDIATE MEDICAL CARE IF:   You have a fever.  You develop severe or persistent headache, ear pain, sinus pain, or chest pain.  You develop wheezing, a prolonged cough, cough up blood, or have a change in your usual mucus (if you have chronic lung disease).  You develop sore muscles or a stiff neck. Document Released: 11/09/2000 Document Revised: 08/08/2011 Document Reviewed:  09/17/2010 [facility] Patient Information 2014 [place], [place].  Otitis Media with Effusion Otitis media with effusion is the presence of fluid in the middle ear. This is a common problem that often follows ear infections. It may be present for weeks or longer after the infection. Unlike an acute ear infection, otits media with effusion refers only to fluid behind the ear drum and not infection. Children with repeated ear and sinus infections and allergy problems are the most likely to get otitis media with effusion. CAUSES  The most frequent cause of the fluid buildup is dysfunction of the eustacian tubes. These are the tubes that drain fluid in the ears to the throat. SYMPTOMS   The main symptom of this condition is hearing loss. As a result, you or your child may:  Listen to the TV at a loud volume.  Not respond to questions.  Ask "what" often when spoken to.  There may be a sensation of fullness or pressure but usually not pain. DIAGNOSIS   Your caregiver will diagnose this condition by examining you or your child's ears.  Your caregiver may test the pressure in you or your child's ear with a tympanometer.  A hearing test may be conducted if the problem persists.  A caregiver will want to  re-evaluate the condition periodically to see if it improves. TREATMENT   Treatment depends on the duration and the effects of the effusion.  Antibiotics, decongestants, nose drops, and cortisone-type drugs may not be helpful.  Children with persistent ear effusions may have delayed language. Children at risk for developmental delays in hearing, learning, and speech may require referral to a specialist earlier than children not at risk.  You or your child's caregiver may suggest a referral to an Ear, Nose, and Throat (ENT) surgeon for treatment. The following may help restore normal hearing:  Drainage of fluid.  Placement of ear tubes (tympanostomy tubes).  Removal of adenoids  (adenoidectomy). HOME CARE INSTRUCTIONS   Avoid second hand smoke.  Infants who are breast fed are less likely to have this condition.  Avoid feeding infants while laying flat.  Avoid known environmental allergens.  Be sure to see a caregiver or an ENT specialist for follow up.  Avoid people who are sick. SEEK MEDICAL CARE IF:   Hearing is not better in 3 months.  Hearing is worse.  Ear pain.  Drainage from the ear.  Dizziness. Document Released: 06/23/2004 Document Revised: 08/08/2011 Document Reviewed: 12/11/2012 [facility] Patient Information 2014 [place], [place].

## 2013-05-10 NOTE — Progress Notes (Signed)
Pre-visit discussion using our clinic review tool. No additional management support is needed unless otherwise documented below in the visit note.  

## 2013-06-27 ENCOUNTER — Encounter (HOSPITAL_COMMUNITY): Payer: Self-pay | Admitting: Emergency Medicine

## 2013-06-27 ENCOUNTER — Emergency Department (HOSPITAL_COMMUNITY)
Admission: EM | Admit: 2013-06-27 | Discharge: 2013-06-27 | Disposition: A | Discharge status: Home / Self Care | Payer: Managed Care, Other (non HMO) | Attending: Emergency Medicine | Admitting: Emergency Medicine

## 2013-06-27 DIAGNOSIS — R0602 Shortness of breath: Secondary | ICD-10-CM | POA: Insufficient documentation

## 2013-06-27 DIAGNOSIS — T628X1A Toxic effect of other specified noxious substances eaten as food, accidental (unintentional), initial encounter: Secondary | ICD-10-CM | POA: Insufficient documentation

## 2013-06-27 DIAGNOSIS — R059 Cough, unspecified: Secondary | ICD-10-CM | POA: Insufficient documentation

## 2013-06-27 DIAGNOSIS — R05 Cough: Secondary | ICD-10-CM | POA: Insufficient documentation

## 2013-06-27 DIAGNOSIS — Y9389 Activity, other specified: Secondary | ICD-10-CM | POA: Insufficient documentation

## 2013-06-27 DIAGNOSIS — Z79899 Other long term (current) drug therapy: Secondary | ICD-10-CM | POA: Insufficient documentation

## 2013-06-27 DIAGNOSIS — K219 Gastro-esophageal reflux disease without esophagitis: Secondary | ICD-10-CM | POA: Insufficient documentation

## 2013-06-27 DIAGNOSIS — Y929 Unspecified place or not applicable: Secondary | ICD-10-CM | POA: Insufficient documentation

## 2013-06-27 DIAGNOSIS — IMO0002 Reserved for concepts with insufficient information to code with codable children: Secondary | ICD-10-CM | POA: Insufficient documentation

## 2013-06-27 DIAGNOSIS — R21 Rash and other nonspecific skin eruption: Secondary | ICD-10-CM | POA: Insufficient documentation

## 2013-06-27 DIAGNOSIS — R062 Wheezing: Secondary | ICD-10-CM | POA: Insufficient documentation

## 2013-06-27 DIAGNOSIS — Z8679 Personal history of other diseases of the circulatory system: Secondary | ICD-10-CM | POA: Insufficient documentation

## 2013-06-27 DIAGNOSIS — T7840XA Allergy, unspecified, initial encounter: Secondary | ICD-10-CM

## 2013-06-27 DIAGNOSIS — Z8639 Personal history of other endocrine, nutritional and metabolic disease: Secondary | ICD-10-CM | POA: Insufficient documentation

## 2013-06-27 DIAGNOSIS — Z8669 Personal history of other diseases of the nervous system and sense organs: Secondary | ICD-10-CM | POA: Insufficient documentation

## 2013-06-27 DIAGNOSIS — Z862 Personal history of diseases of the blood and blood-forming organs and certain disorders involving the immune mechanism: Secondary | ICD-10-CM | POA: Insufficient documentation

## 2013-06-27 MED ORDER — METHYLPREDNISOLONE SODIUM SUCC 125 MG IJ SOLR
125.0000 mg | Freq: Once | INTRAMUSCULAR | Status: AC
  Administered 2013-06-27: 125 mg via INTRAVENOUS
  Filled 2013-06-27: qty 2

## 2013-06-27 MED ORDER — EPINEPHRINE 0.3 MG/0.3ML IJ SOAJ: 0.3000 mg | Freq: Once | INTRAMUSCULAR | Status: DC

## 2013-06-27 MED ORDER — FAMOTIDINE IN NACL 20-0.9 MG/50ML-% IV SOLN
20.0000 mg | Freq: Once | INTRAVENOUS | Status: AC
  Administered 2013-06-27: 20 mg via INTRAVENOUS
  Filled 2013-06-27: qty 50

## 2013-06-27 MED ORDER — PREDNISONE 20 MG PO TABS: ORAL_TABLET | ORAL | Status: DC

## 2013-06-27 MED ORDER — DIPHENHYDRAMINE HCL 50 MG/ML IJ SOLN
25.0000 mg | Freq: Once | INTRAMUSCULAR | Status: AC
  Administered 2013-06-27: 25 mg via INTRAVENOUS
  Filled 2013-06-27: qty 1

## 2013-06-27 NOTE — ED Notes (Signed)
Patient is alert and oriented x3.  She was given DC instructions and follow up visit instructions.  Patient gave verbal understanding. She was DC ambulatory under her own power to home.  V/S stable.  He was not showing any signs of distress on DC 

## 2013-06-27 NOTE — ED Notes (Signed)
Bed: [id number] Expected date:  Expected time:  Means of arrival:  Comments: EMS 47yo F, allergic reaction

## 2013-06-27 NOTE — ED Notes (Signed)
Patient is alert and oriented x3.  She is complaining of a possible allergic reaction.   She states that she has been wheezing most of the day and suddenly got worse This evening.  Patient self administered Epi Pen.  EMS states that patient has a  Peanut allergy but has not had anything that contained peanuts today.  She also has A history of asthma.  EMS states patient has good lung sounds.

## 2013-06-27 NOTE — ED Provider Notes (Signed)
CSN: [id number]     Arrival date & time 06/27/13  1935 History   First MD Initiated Contact with Patient 06/27/13 1936     Chief Complaint  Patient presents with  . Allergic Reaction   (Consider location/radiation/quality/duration/timing/severity/associated sxs/prior Treatment) HPI Comments: Patient presents to the ER for evaluation of presumed food allergy. Patient has a history of significant reactions to peanuts. She did not knowingly ingest any peanuts, but had onset of "whelps" on her extremities and torso with shortness of breath and wheezing. Patient took Benadryl 50 mg by mouth and administered her EpiPen. Her breathing has improved and the rash has resolved.  Patient does indicate that she has had similar reactions to this in the past and had to give herself an epi. Each time in the past was related to peanuts.  Patient also reports that she has been experiencing cough, chest congestion with some increased wheezing over the last several days.  Patient is a 48 y.o. female presenting with allergic reaction.  Allergic Reaction Presenting symptoms: rash and wheezing     Past Medical History  Diagnosis Date  . ANEMIA-NOS   . GERD   . HYPERLIPIDEMIA, WITH LOW HDL   . 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 MIGRAINE HEADACHE   . PERIPHERAL NEUROPATHY    Past Surgical History  Procedure Laterality Date  . Appendectomy  1992  . Abdominal hysterectomy  1996  . Cesarean section  1989  . Cholecystectomy  04/22/2011    Procedure: LAPAROSCOPIC CHOLECYSTECTOMY WITH INTRAOPERATIVE CHOLANGIOGRAM;  Surgeon: Almond Lint, MD;  Location: [facility] OR;  Service: General;  Laterality: N/A;   Family History  Problem Relation Age of Onset  . Diabetes Mother   . Prostate cancer Father   . Hypertension Father   . Heart disease Father   . Diabetes Brother   . Alcohol abuse Other   . Arthritis Other   . Hypertension Other    History  Substance Use Topics  . Smoking status: Never Smoker   .  Smokeless tobacco: Never Used  . Alcohol Use: No   OB History   Grav Para Term Preterm Abortions TAB SAB Ect Mult Living   5 5        5      Review of Systems  Respiratory: Positive for cough, shortness of breath and wheezing.   Skin: Positive for rash.  All other systems reviewed and are negative.    Allergies  Peanuts; Corn dextrin; Gluten meal; Shellfish allergy; Soy allergy; Sunflower oil; Tomato; Wheat bran; and Morphine  Home Medications   Current Outpatient Rx  Name  Route  Sig  Dispense  Refill  . albuterol (PROVENTIL HFA;VENTOLIN HFA) 108 (90 BASE) MCG/ACT inhaler   Inhalation   Inhale 2 puffs into the lungs every 6 (six) hours as needed for wheezing.         . brimonidine (ALPHAGAN) 0.15 % ophthalmic solution   Both Eyes   Place 1 drop into both eyes daily.         . budesonide-formoterol (SYMBICORT) 80-4.5 MCG/ACT inhaler   Inhalation   Inhale 2 puffs into the lungs 2 (two) times daily.         . diphenhydrAMINE (BENADRYL) 25 mg capsule   Oral   Take 25 mg by mouth 3 (three) times daily. For itching/allergic reactions         . EPINEPHrine (EPI-PEN) 0.3 mg/0.3 mL DEVI   Intramuscular   Inject 0.3 mg into  the muscle once as needed (for severe allergic reaction). CAll 911 immediately if you have to use this medicine         . fexofenadine (ALLEGRA) 180 MG tablet   Oral   Take 180 mg by mouth daily.         . fluticasone (FLONASE) 50 MCG/ACT nasal spray   Nasal   Place 2 sprays into the nose daily.         . montelukast (SINGULAIR) 10 MG tablet   Oral   Take 1 tablet (10 mg total) by mouth at bedtime.   30 tablet   11   . omeprazole (PRILOSEC) 40 MG capsule   Oral   Take 1 capsule by mouth daily.         Marland Kitchen omeprazole (PRILOSEC) 40 MG capsule   Oral   Take 1 capsule (40 mg total) by mouth daily.   30 capsule   prn   . pyridOXINE (VITAMIN B-6) 100 MG tablet   Oral   Take 100 mg by mouth daily.          Marland Kitchen Spacer/Aero-Holding  Chambers (AEROCHAMBER MV) inhaler      Use as instructed   1 each   0   . traMADol (ULTRAM) 50 MG tablet   Oral   Take 50-100 mg by mouth every 8 (eight) hours as needed for pain.         Marland Kitchen travoprost, benzalkonium, (TRAVATAN) 0.004 % ophthalmic solution   Both Eyes   Place 1 drop into both eyes at bedtime.           There were no vitals taken for this visit. Physical Exam  Constitutional: She is oriented to person, place, and time. She appears well-developed and well-nourished. No distress.  HENT:  Head: Normocephalic and atraumatic.  Right Ear: Hearing normal.  Left Ear: Hearing normal.  Nose: Nose normal.  Mouth/Throat: Oropharynx is clear and moist and mucous membranes are normal.  Eyes: Conjunctivae and EOM are normal. Pupils are equal, round, and reactive to light.  Neck: Normal range of motion. Neck supple.  Cardiovascular: Regular rhythm, S1 normal and S2 normal.  Exam reveals no gallop and no friction rub.   No murmur heard. Pulmonary/Chest: Effort normal and breath sounds normal. No respiratory distress. She exhibits no tenderness.  Abdominal: Soft. Normal appearance and bowel sounds are normal. There is no hepatosplenomegaly. There is no tenderness. There is no rebound, no guarding, no tenderness at McBurney's point and negative Murphy's sign. No hernia.  Musculoskeletal: Normal range of motion.  Neurological: She is alert and oriented to person, place, and time. She has normal strength. No cranial nerve deficit or sensory deficit. Coordination normal. GCS eye subscore is 4. GCS verbal subscore is 5. GCS motor subscore is 6.  Skin: Skin is warm, dry and intact. No rash noted. No cyanosis.  Psychiatric: She has a normal mood and affect. Her speech is normal and behavior is normal. Thought content normal.    ED Course  Procedures (including critical care time) Labs Review Labs Reviewed - No data to display Imaging Review No results found.  EKG Interpretation    None       MDM  Diagnosis: Acute allergic reaction  Patient presents to ER for presumed food allergy reaction. Patient has a history of allergic reaction to peanuts. She is unclear if she had any contact with peanuts today. She did have some increased asthma symptoms over the last several days, but she  had sudden onset of urticaria and wheezing this evening. This is consistent with previous reactions she has had. She had significant improvement after administering epinephrine at home. She also took oral Benadryl. Patient given additional Benadryl, Pepcid and Solu-Medrol here in the ER. She was monitored here in the ER without any rebound symptoms. Will be discharged on prednisone taper, continue Benadryl.    Gilda Creasehristopher J. Pollina, MD 06/27/13 2104

## 2013-06-27 NOTE — Discharge Instructions (Signed)
Food Allergy °A food allergy occurs from eating something you are sensitive to. Food allergies occur in all age groups. It may be passed to you from your parents (heredity).  °CAUSES  °Some common causes are cow's milk, seafood, eggs, nuts (including peanut butter), wheat, and soybeans. °SYMPTOMS  °Common problems are:  °· Swelling around the mouth. °· An itchy, red rash. °· Hives. °· Vomiting. °· Diarrhea. °Severe allergic reactions are life-threatening. This reaction is called anaphylaxis. It can cause the mouth and throat to swell. This makes it hard to breathe and swallow. In severe reactions, only a small amount of food may be fatal within seconds. °HOME CARE INSTRUCTIONS  °· If you are unsure what caused the reaction, keep a diary of foods eaten and symptoms that followed. Avoid foods that cause reactions. °· If hives or rash are present: °· Take medicines as directed. °· Use an over-the-counter antihistamine (diphenhydramine) to treat hives and itching as needed. °· Apply cold compresses to the skin or take baths in cool water. Avoid hot baths or showers. These will increase the redness and itching. °· If you are severely allergic: °· Hospitalization is often required following a severe reaction. °· Wear a medical alert bracelet or necklace that describes the allergy. °· Carry your anaphylaxis kit or epinephrine injection with you at all times. Both you and your family members should know how to use this. This can be lifesaving if you have a severe reaction. If epinephrine is used, it is important for you to seek immediate medical care or call your local emergency services (911 in [place] When the epinephrine wears off, it can be followed by a delayed reaction, which can be fatal. °· Replace your epinephrine immediately after use in case of another reaction. °· Ask your caregiver for instructions if you have not been taught how to use an epinephrine injection. °· Do not drive until medicines used to treat the  reaction have worn off, unless approved by your caregiver. °SEEK MEDICAL CARE IF:  °· You suspect a food allergy. Symptoms generally happen within 30 minutes of eating a food. °· Your symptoms have not gone away within 2 days. See your caregiver sooner if symptoms are getting worse. °· You develop new symptoms. °· You want to retest yourself with a food or drink you think causes an allergic reaction. Never do this if an anaphylactic reaction to that food or drink has happened before. °· There is a return of the symptoms which brought you to your caregiver. °SEEK IMMEDIATE MEDICAL CARE IF:  °· You have trouble breathing, are wheezing, or you have a tight feeling in your chest or throat. °· You have a swollen mouth, or you have hives, swelling, or itching all over your body. Use your epinephrine injection immediately. This is given into the outside of your thigh, deep into the muscle. Following use of the epinephrine injection, seek help right away. °Seek immediate medical care or call your local emergency services (911 in [place] °MAKE SURE YOU:  °· Understand these instructions. °· Will watch your condition. °· Will get help right away if you are not doing well or get worse. °Document Released: 05/13/2000 Document Revised: 08/08/2011 Document Reviewed: 01/03/2008 °[employer]® Patient Information ©2014 [employer]. ° °
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
## 2013-08-01 ENCOUNTER — Emergency Department (HOSPITAL_COMMUNITY)
Admission: EM | Admit: 2013-08-01 | Discharge: 2013-08-01 | Disposition: A | Discharge status: Home / Self Care | Payer: Managed Care, Other (non HMO) | Attending: Emergency Medicine | Admitting: Emergency Medicine

## 2013-08-01 ENCOUNTER — Encounter (HOSPITAL_COMMUNITY): Payer: Self-pay | Admitting: Emergency Medicine

## 2013-08-01 DIAGNOSIS — Y9289 Other specified places as the place of occurrence of the external cause: Secondary | ICD-10-CM | POA: Insufficient documentation

## 2013-08-01 DIAGNOSIS — R498 Other voice and resonance disorders: Secondary | ICD-10-CM | POA: Insufficient documentation

## 2013-08-01 DIAGNOSIS — IMO0002 Reserved for concepts with insufficient information to code with codable children: Secondary | ICD-10-CM | POA: Insufficient documentation

## 2013-08-01 DIAGNOSIS — Y9389 Activity, other specified: Secondary | ICD-10-CM | POA: Insufficient documentation

## 2013-08-01 DIAGNOSIS — T628X1A Toxic effect of other specified noxious substances eaten as food, accidental (unintentional), initial encounter: Secondary | ICD-10-CM | POA: Insufficient documentation

## 2013-08-01 DIAGNOSIS — Z8639 Personal history of other endocrine, nutritional and metabolic disease: Secondary | ICD-10-CM | POA: Insufficient documentation

## 2013-08-01 DIAGNOSIS — R6889 Other general symptoms and signs: Secondary | ICD-10-CM | POA: Insufficient documentation

## 2013-08-01 DIAGNOSIS — Z91010 Allergy to peanuts: Secondary | ICD-10-CM

## 2013-08-01 DIAGNOSIS — Z862 Personal history of diseases of the blood and blood-forming organs and certain disorders involving the immune mechanism: Secondary | ICD-10-CM | POA: Insufficient documentation

## 2013-08-01 DIAGNOSIS — Z8669 Personal history of other diseases of the nervous system and sense organs: Secondary | ICD-10-CM | POA: Insufficient documentation

## 2013-08-01 DIAGNOSIS — Z79899 Other long term (current) drug therapy: Secondary | ICD-10-CM | POA: Insufficient documentation

## 2013-08-01 DIAGNOSIS — T781XXA Other adverse food reactions, not elsewhere classified, initial encounter: Secondary | ICD-10-CM

## 2013-08-01 DIAGNOSIS — Z8679 Personal history of other diseases of the circulatory system: Secondary | ICD-10-CM | POA: Insufficient documentation

## 2013-08-01 DIAGNOSIS — K219 Gastro-esophageal reflux disease without esophagitis: Secondary | ICD-10-CM | POA: Insufficient documentation

## 2013-08-01 DIAGNOSIS — L299 Pruritus, unspecified: Secondary | ICD-10-CM | POA: Insufficient documentation

## 2013-08-01 MED ORDER — PREDNISONE 10 MG PO TABS: 20.0000 mg | ORAL_TABLET | Freq: Every day | ORAL | Status: DC

## 2013-08-01 MED ORDER — FAMOTIDINE 20 MG PO TABS: 20.0000 mg | ORAL_TABLET | Freq: Two times a day (BID) | ORAL | Status: DC

## 2013-08-01 MED ORDER — FAMOTIDINE IN NACL 20-0.9 MG/50ML-% IV SOLN
20.0000 mg | Freq: Once | INTRAVENOUS | Status: AC
  Administered 2013-08-01: 20 mg via INTRAVENOUS
  Filled 2013-08-01: qty 50

## 2013-08-01 MED ORDER — METHYLPREDNISOLONE SODIUM SUCC 125 MG IJ SOLR
125.0000 mg | Freq: Once | INTRAMUSCULAR | Status: AC
  Administered 2013-08-01: 125 mg via INTRAVENOUS
  Filled 2013-08-01: qty 2

## 2013-08-01 NOTE — ED Notes (Signed)
Per pt, states she feels much better since being given meds-no s/s's of respiratory distress

## 2013-08-01 NOTE — ED Notes (Signed)
MD at bedside. 

## 2013-08-01 NOTE — ED Notes (Signed)
Bed: WLPT1 Expected date:  Expected time:  Means of arrival:  Comments: Allergic Reaction

## 2013-08-01 NOTE — Discharge Instructions (Signed)
Food Allergy °A food allergy occurs from eating something you are sensitive to. Food allergies occur in all age groups. It may be passed to you from your parents (heredity).  °CAUSES  °Some common causes are cow's milk, seafood, eggs, nuts (including peanut butter), wheat, and soybeans. °SYMPTOMS  °Common problems are:  °· Swelling around the mouth. °· An itchy, red rash. °· Hives. °· Vomiting. °· Diarrhea. °Severe allergic reactions are life-threatening. This reaction is called anaphylaxis. It can cause the mouth and throat to swell. This makes it hard to breathe and swallow. In severe reactions, only a small amount of food may be fatal within seconds. °HOME CARE INSTRUCTIONS  °· If you are unsure what caused the reaction, keep a diary of foods eaten and symptoms that followed. Avoid foods that cause reactions. °· If hives or rash are present: °· Take medicines as directed. °· Use an over-the-counter antihistamine (diphenhydramine) to treat hives and itching as needed. °· Apply cold compresses to the skin or take baths in cool water. Avoid hot baths or showers. These will increase the redness and itching. °· If you are severely allergic: °· Hospitalization is often required following a severe reaction. °· Wear a medical alert bracelet or necklace that describes the allergy. °· Carry your anaphylaxis kit or epinephrine injection with you at all times. Both you and your family members should know how to use this. This can be lifesaving if you have a severe reaction. If epinephrine is used, it is important for you to seek immediate medical care or call your local emergency services (911 in [place] When the epinephrine wears off, it can be followed by a delayed reaction, which can be fatal. °· Replace your epinephrine immediately after use in case of another reaction. °· Ask your caregiver for instructions if you have not been taught how to use an epinephrine injection. °· Do not drive until medicines used to treat the  reaction have worn off, unless approved by your caregiver. °SEEK MEDICAL CARE IF:  °· You suspect a food allergy. Symptoms generally happen within 30 minutes of eating a food. °· Your symptoms have not gone away within 2 days. See your caregiver sooner if symptoms are getting worse. °· You develop new symptoms. °· You want to retest yourself with a food or drink you think causes an allergic reaction. Never do this if an anaphylactic reaction to that food or drink has happened before. °· There is a return of the symptoms which brought you to your caregiver. °SEEK IMMEDIATE MEDICAL CARE IF:  °· You have trouble breathing, are wheezing, or you have a tight feeling in your chest or throat. °· You have a swollen mouth, or you have hives, swelling, or itching all over your body. Use your epinephrine injection immediately. This is given into the outside of your thigh, deep into the muscle. Following use of the epinephrine injection, seek help right away. °Seek immediate medical care or call your local emergency services (911 in [place] °MAKE SURE YOU:  °· Understand these instructions. °· Will watch your condition. °· Will get help right away if you are not doing well or get worse. °Document Released: 05/13/2000 Document Revised: 08/08/2011 Document Reviewed: 01/03/2008 °[employer]® Patient Information ©2014 [employer]. ° °

## 2013-08-01 NOTE — ED Notes (Signed)
Bed: WA17 Expected date:  Expected time:  Means of arrival:  Comments: Triage 1  
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
## 2013-08-01 NOTE — ED Notes (Signed)
Pt alert and oriented x4. Respirations even and unlabored, bilateral symmetrical rise and fall of chest. Skin warm and dry. In no acute distress. Denies needs.   

## 2013-08-01 NOTE — ED Provider Notes (Signed)
CSN: [id number]     Arrival date & time 08/01/13  1651 History   First MD Initiated Contact with Patient 08/01/13 1817     Chief Complaint  Patient presents with  . Allergic Reaction     (Consider location/radiation/quality/duration/timing/severity/associated sxs/prior Treatment) Patient is a 48 y.o. female presenting with allergic reaction. The history is provided by the patient and medical records. No language interpreter was used.  Allergic Reaction Presenting symptoms: itching   Presenting symptoms: no wheezing   Severity:  Moderate Prior allergic episodes:  Food/nut allergies Context: nuts   Relieved by:  Epinephrine and antihistamines Worsened by:  Nothing tried Patient was on jury duty today, nearby juror eating peanut butter crackers, and patient developed "itchy" throat and voice change.  No shortness of breath.  Pruritis without hives.  Patient received IM epi and 50 mg benadryl po by EMS.  Past Medical History  Diagnosis Date  . ANEMIA-NOS   . GERD   . HYPERLIPIDEMIA, WITH LOW HDL   . POSITIVE PPD 1987    s/p 58mo tx  . MIGRAINE HEADACHE   . PERIPHERAL NEUROPATHY    Past Surgical History  Procedure Laterality Date  . Appendectomy  1992  . Abdominal hysterectomy  1996  . Cesarean section  1989  . Cholecystectomy  04/22/2011    Procedure: LAPAROSCOPIC CHOLECYSTECTOMY WITH INTRAOPERATIVE CHOLANGIOGRAM;  Surgeon: Almond LintFaera Byerly, MD;  Location: [facility] OR;  Service: General;  Laterality: N/A;   Family History  Problem Relation Age of Onset  . Diabetes Mother   . Prostate cancer Father   . Hypertension Father   . Heart disease Father   . Diabetes Brother   . Alcohol abuse Other   . Arthritis Other   . Hypertension Other    History  Substance Use Topics  . Smoking status: Never Smoker   . Smokeless tobacco: Never Used  . Alcohol Use: No   OB History   Grav Para Term Preterm Abortions TAB SAB Ect Mult Living   5 5        5      Review of Systems  HENT: Positive  for voice change.   Respiratory: Negative for shortness of breath, wheezing and stridor.   Skin: Positive for itching.  All other systems reviewed and are negative.      Allergies  Peanuts; Corn dextrin; Gluten meal; Shellfish allergy; Soy allergy; Sunflower oil; Tomato; Wheat bran; and Morphine  Home Medications   Current Outpatient Rx  Name  Route  Sig  Dispense  Refill  . albuterol (PROVENTIL HFA;VENTOLIN HFA) 108 (90 BASE) MCG/ACT inhaler   Inhalation   Inhale 2 puffs into the lungs every 6 (six) hours as needed for wheezing.         . brimonidine (ALPHAGAN) 0.15 % ophthalmic solution   Both Eyes   Place 1 drop into both eyes 2 (two) times daily.         . budesonide-formoterol (SYMBICORT) 80-4.5 MCG/ACT inhaler   Inhalation   Inhale 2 puffs into the lungs 2 (two) times daily.         . diphenhydrAMINE (BENADRYL) 25 mg capsule   Oral   Take 25 mg by mouth 3 (three) times daily as needed for itching (reactions).          . EPINEPHrine (EPI-PEN) 0.3 mg/0.3 mL DEVI   Intramuscular   Inject 0.3 mg into the muscle once as needed (for severe allergic reaction). CAll 911 immediately if you have to use this  medicine         . fexofenadine (ALLEGRA) 180 MG tablet   Oral   Take 180 mg by mouth daily.         . fluticasone (FLONASE) 50 MCG/ACT nasal spray   Nasal   Place 2 sprays into the nose daily as needed for allergies.          . montelukast (SINGULAIR) 10 MG tablet   Oral   Take 1 tablet (10 mg total) by mouth at bedtime.   30 tablet   11   . omeprazole (PRILOSEC) 40 MG capsule   Oral   Take 40 mg by mouth daily.         Marland Kitchen pyridOXINE (VITAMIN B-6) 100 MG tablet   Oral   Take 100 mg by mouth daily.          Marland Kitchen Spacer/Aero-Holding Chambers (AEROCHAMBER MV) inhaler      Use as instructed   1 each   0    BP 147/72  Temp(Src) 98.8 F (37.1 C)  Resp 20  Ht 5\' 6"  (1.676 m)  Wt 230 lb (104.327 kg)  BMI 37.14 kg/m2  SpO2 98% Physical  Exam  Nursing note and vitals reviewed. Constitutional: She is oriented to person, place, and time. She appears well-developed and well-nourished.  HENT:  Head: Normocephalic.  Eyes: Pupils are equal, round, and reactive to light.  Neck: Normal range of motion.  Cardiovascular: Normal rate and regular rhythm.   Pulmonary/Chest: Effort normal and breath sounds normal. No stridor. No respiratory distress. She has no wheezes.  Abdominal: Soft. Bowel sounds are normal.  Musculoskeletal: She exhibits no edema and no tenderness.  Lymphadenopathy:    She has no cervical adenopathy.  Neurological: She is alert and oriented to person, place, and time.  Skin: Skin is warm and dry. No rash noted.  Psychiatric: She has a normal mood and affect. Her behavior is normal. Judgment and thought content normal.    ED Course  Procedures (including critical care time) Labs Review Labs Reviewed - No data to display Imaging Review No results found.   EKG Interpretation None     Patient with exposure to allergen (known history of peanut allergy)  today resulting in pruritis without hives, throat "itching", and voice change.  No respiratory distress, tongue or throat swelling, or difficulty swallowing.  Patient had been treated by EMS with IM epi and po benadryl.  Patient received solumedrol and pepcid in ED.  Voice has improved. Pruritis resolved.  Continues to deny any trouble breathing or swallowing.  Lungs remain clear to auscultation.  No oral or tongue edema noted.  Discussed patient with Dr. Patria Mane.  Will discharge home with pepcid and short course of prednisone.  Return precautions discussed with patient. MDM   Final diagnoses:  None    Allergic reaction.    Jimmye Norman, NP 08/02/13 [phone]

## 2013-08-03 ENCOUNTER — Encounter (HOSPITAL_COMMUNITY): Payer: Self-pay | Admitting: Emergency Medicine

## 2013-08-03 ENCOUNTER — Emergency Department (HOSPITAL_COMMUNITY)
Admission: EM | Admit: 2013-08-03 | Discharge: 2013-08-03 | Disposition: A | Discharge status: Home / Self Care | Payer: Managed Care, Other (non HMO) | Attending: Emergency Medicine | Admitting: Emergency Medicine

## 2013-08-03 DIAGNOSIS — IMO0002 Reserved for concepts with insufficient information to code with codable children: Secondary | ICD-10-CM | POA: Insufficient documentation

## 2013-08-03 DIAGNOSIS — T7840XA Allergy, unspecified, initial encounter: Secondary | ICD-10-CM

## 2013-08-03 DIAGNOSIS — Y9389 Activity, other specified: Secondary | ICD-10-CM | POA: Insufficient documentation

## 2013-08-03 DIAGNOSIS — R0602 Shortness of breath: Secondary | ICD-10-CM | POA: Insufficient documentation

## 2013-08-03 DIAGNOSIS — Y929 Unspecified place or not applicable: Secondary | ICD-10-CM | POA: Insufficient documentation

## 2013-08-03 DIAGNOSIS — R498 Other voice and resonance disorders: Secondary | ICD-10-CM | POA: Insufficient documentation

## 2013-08-03 DIAGNOSIS — R1310 Dysphagia, unspecified: Secondary | ICD-10-CM | POA: Insufficient documentation

## 2013-08-03 DIAGNOSIS — L299 Pruritus, unspecified: Secondary | ICD-10-CM | POA: Insufficient documentation

## 2013-08-03 DIAGNOSIS — T628X1A Toxic effect of other specified noxious substances eaten as food, accidental (unintentional), initial encounter: Secondary | ICD-10-CM | POA: Insufficient documentation

## 2013-08-03 DIAGNOSIS — Z79899 Other long term (current) drug therapy: Secondary | ICD-10-CM | POA: Insufficient documentation

## 2013-08-03 LAB — I-STAT CHEM 8, ED
BUN: 11 mg/dL (ref 6–23)
CHLORIDE: 103 meq/L (ref 96–112)
Calcium, Ion: 1.14 mmol/L (ref 1.12–1.23)
Creatinine, Ser: 1.1 mg/dL (ref 0.50–1.10)
Glucose, Bld: 120 mg/dL — ABNORMAL HIGH (ref 70–99)
HEMATOCRIT: 41 % (ref 36.0–46.0)
HEMOGLOBIN: 13.9 g/dL (ref 12.0–15.0)
POTASSIUM: 3.5 meq/L — AB (ref 3.7–5.3)
SODIUM: 143 meq/L (ref 137–147)
TCO2: 29 mmol/L (ref 0–100)

## 2013-08-03 LAB — CBC WITH DIFFERENTIAL/PLATELET
Basophils Absolute: 0 10*3/uL (ref 0.0–0.1)
Basophils Relative: 0 % (ref 0–1)
Eosinophils Absolute: 0 10*3/uL (ref 0.0–0.7)
Eosinophils Relative: 1 % (ref 0–5)
HEMATOCRIT: 39 % (ref 36.0–46.0)
HEMOGLOBIN: 13.1 g/dL (ref 12.0–15.0)
Lymphocytes Relative: 38 % (ref 12–46)
Lymphs Abs: 2.3 10*3/uL (ref 0.7–4.0)
MCH: 29.5 pg (ref 26.0–34.0)
MCHC: 33.6 g/dL (ref 30.0–36.0)
MCV: 87.8 fL (ref 78.0–100.0)
Monocytes Absolute: 0.7 10*3/uL (ref 0.1–1.0)
Monocytes Relative: 11 % (ref 3–12)
NEUTROS ABS: 3.1 10*3/uL (ref 1.7–7.7)
NEUTROS PCT: 51 % (ref 43–77)
Platelets: 236 10*3/uL (ref 150–400)
RBC: 4.44 MIL/uL (ref 3.87–5.11)
RDW: 12.9 % (ref 11.5–15.5)
WBC: 6.1 10*3/uL (ref 4.0–10.5)

## 2013-08-03 MED ORDER — DIPHENHYDRAMINE HCL 25 MG PO TABS: 25.0000 mg | ORAL_TABLET | Freq: Four times a day (QID) | ORAL | Status: DC

## 2013-08-03 MED ORDER — EPINEPHRINE 0.3 MG/0.3ML IJ SOAJ: 0.3000 mg | INTRAMUSCULAR | Status: DC | PRN

## 2013-08-03 MED ORDER — FAMOTIDINE 20 MG PO TABS: 20.0000 mg | ORAL_TABLET | Freq: Two times a day (BID) | ORAL | Status: DC

## 2013-08-03 MED ORDER — PREDNISONE 10 MG PO TABS: 40.0000 mg | ORAL_TABLET | Freq: Every day | ORAL | Status: DC

## 2013-08-03 MED ORDER — METHYLPREDNISOLONE SODIUM SUCC 125 MG IJ SOLR
125.0000 mg | Freq: Once | INTRAMUSCULAR | Status: AC
  Administered 2013-08-03: 125 mg via INTRAVENOUS
  Filled 2013-08-03: qty 2

## 2013-08-03 NOTE — ED Notes (Signed)
Bed: WA01 Expected date: 08/03/13 Expected time: 8:12 PM Means of arrival: Ambulance Comments: 48 yo F  Allergic reaction

## 2013-08-03 NOTE — Discharge Instructions (Signed)
Allergies  Allergies may happen from anything your body is sensitive to. This may be food, medicines, pollens, chemicals, and many other things. Food allergies can be severe and deadly.  HOME CARE  If you do not know what causes a reaction, keep a diary. Write down the foods you ate and the symptoms that followed. Avoid foods that cause reactions.  If you have red raised spots (hives) or a rash:  Take medicine as told by your doctor.  Use medicines for red raised spots and itching as needed.  Apply cold cloths (compresses) to the skin. Take a cool bath. Avoid hot baths or showers.  If you are severely allergic:  It is often necessary to go to the hospital after you have treated your reaction.  Wear your medical alert jewelry.  You and your family must learn how to give a allergy shot or use an allergy kit (anaphylaxis kit).  Always carry your allergy kit or shot with you. Use this medicine as told by your doctor if a severe reaction is occurring. GET HELP RIGHT AWAY IF:  You have trouble breathing or are making high-pitched whistling sounds (wheezing).  You have a tight feeling in your chest or throat.  You have a puffy (swollen) mouth.  You have red raised spots, puffiness (swelling), or itching all over your body.  You have had a severe reaction that was helped by your allergy kit or shot. The reaction can return once the medicine has worn off.  You think you are having a food allergy. Symptoms most often happen within 30 minutes of eating a food.  Your symptoms have not gone away within 2 days or are getting worse.  You have new symptoms.  You want to retest yourself with a food or drink you think causes an allergic reaction. Only do this under the care of a doctor. MAKE SURE YOU:   Understand these instructions.  Will watch your condition.  Will get help right away if you are not doing well or get worse. Document Released: 09/10/2012 Document Reviewed:  09/10/2012 [facility] Patient Information 2014 [place], [place].  Take medications as directed. Take the prednisone for the next 5 days take Benadryl at least every 6 hours for the next 2 days. Take Pepcid for the next 7 days. EpiPen's have been renewed. Return for any new or worse symptoms particularly for any worsening of the throat tightness or tongue swelling. Her any trouble breathing.

## 2013-08-03 NOTE — ED Provider Notes (Signed)
CSN: [id number]     Arrival date & time 08/03/13  2030 History   First MD Initiated Contact with Patient 08/03/13 2030     Chief Complaint  Patient presents with  . Allergic Reaction     (Consider location/radiation/quality/duration/timing/severity/associated sxs/prior Treatment) Patient is a [age] y.o. female presenting with allergic reaction. The history is provided by the patient, the EMS personnel and the spouse.  Allergic Reaction Presenting symptoms: difficulty swallowing   Presenting symptoms: no rash    patient with known sniffing allergy to peanuts. Diet treating some chicken that was seized and the patient had the sudden onset of itching without hives that had throat tightness trouble breathing and felt as if she was in a pass out. Patient gave her self and EpiPen at home also took 50 mg of Benadryl by mouth. EMS called they gave her Zantac 50 mg by mouth in route. Patient now feeling much better oxygen saturations 90% on room air. Patient states still feels as if for some tightness in the throat her voice is still hoarse but she previously better and the throat tightness is improved. Never had a rash.  Past Medical History  Diagnosis Date  . ANEMIA-NOS   . GERD   . HYPERLIPIDEMIA, WITH LOW HDL   . POSITIVE PPD 1987    s/p 65mo tx  . MIGRAINE HEADACHE   . PERIPHERAL NEUROPATHY    Past Surgical History  Procedure Laterality Date  . Appendectomy  1992  . Abdominal hysterectomy  1996  . Cesarean section  1989  . Cholecystectomy  04/22/2011    Procedure: LAPAROSCOPIC CHOLECYSTECTOMY WITH INTRAOPERATIVE CHOLANGIOGRAM;  Surgeon: Almond LintFaera Byerly, MD;  Location: [facility] OR;  Service: General;  Laterality: N/A;   Family History  Problem Relation Age of Onset  . Diabetes Mother   . Prostate cancer Father   . Hypertension Father   . Heart disease Father   . Diabetes Brother   . Alcohol abuse Other   . Arthritis Other   . Hypertension Other    History  Substance Use Topics  . Smoking  status: Never Smoker   . Smokeless tobacco: Never Used  . Alcohol Use: No   OB History   Grav Para Term Preterm Abortions TAB SAB Ect Mult Living   5 5        5      Review of Systems  Constitutional: Negative for fever.  HENT: Positive for trouble swallowing and voice change. Negative for congestion and sore throat.   Eyes: Negative for itching and visual disturbance.  Respiratory: Positive for shortness of breath.   Cardiovascular: Negative for chest pain.  Gastrointestinal: Negative for abdominal pain.  Genitourinary: Negative for dysuria.  Musculoskeletal: Negative for back pain.  Skin: Negative for rash.  Neurological: Negative for headaches.  Hematological: Does not bruise/bleed easily.  Psychiatric/Behavioral: Negative for confusion.      Allergies  Peanuts; Corn dextrin; Gluten meal; Shellfish allergy; Soy allergy; Sunflower oil; Tomato; Wheat bran; and Morphine  Home Medications   Current Outpatient Rx  Name  Route  Sig  Dispense  Refill  . albuterol (PROVENTIL HFA;VENTOLIN HFA) 108 (90 BASE) MCG/ACT inhaler   Inhalation   Inhale 2 puffs into the lungs every 6 (six) hours as needed for wheezing.         . brimonidine (ALPHAGAN) 0.15 % ophthalmic solution   Both Eyes   Place 1 drop into both eyes 2 (two) times daily.         .Marland Kitchen  budesonide-formoterol (SYMBICORT) 80-4.5 MCG/ACT inhaler   Inhalation   Inhale 2 puffs into the lungs 2 (two) times daily.         . diphenhydrAMINE (BENADRYL) 25 mg capsule   Oral   Take 25 mg by mouth 3 (three) times daily as needed for itching (reactions).          . EPINEPHrine (EPI-PEN) 0.3 mg/0.3 mL DEVI   Intramuscular   Inject 0.3 mg into the muscle once as needed (for severe allergic reaction). CAll 911 immediately if you have to use this medicine         . famotidine (PEPCID) 20 MG tablet   Oral   Take 1 tablet (20 mg total) by mouth 2 (two) times daily.   14 tablet   0   . fluticasone (FLONASE) 50 MCG/ACT  nasal spray   Nasal   Place 2 sprays into the nose daily as needed for allergies.          Marland Kitchen loratadine (CLARITIN) 10 MG tablet   Oral   Take 10 mg by mouth daily.         . montelukast (SINGULAIR) 10 MG tablet   Oral   Take 1 tablet (10 mg total) by mouth at bedtime.   30 tablet   11   . omeprazole (PRILOSEC) 40 MG capsule   Oral   Take 40 mg by mouth daily.         . predniSONE (DELTASONE) 10 MG tablet   Oral   Take 2 tablets (20 mg total) by mouth daily.   14 tablet   0   . pyridOXINE (VITAMIN B-6) 100 MG tablet   Oral   Take 100 mg by mouth daily.          . diphenhydrAMINE (BENADRYL) 25 MG tablet   Oral   Take 1 tablet (25 mg total) by mouth every 6 (six) hours.   20 tablet   0   . EPINEPHrine (EPIPEN) 0.3 mg/0.3 mL SOAJ injection   Intramuscular   Inject 0.3 mLs (0.3 mg total) into the muscle as needed.   2 Device   2   . famotidine (PEPCID) 20 MG tablet   Oral   Take 1 tablet (20 mg total) by mouth 2 (two) times daily.   14 tablet   0   . predniSONE (DELTASONE) 10 MG tablet   Oral   Take 4 tablets (40 mg total) by mouth daily.   20 tablet   0    BP 114/85  Pulse 83  Temp(Src) 97.8 F (36.6 C) (Oral)  Resp 18  SpO2 97% Physical Exam  Nursing note and vitals reviewed. Constitutional: She is oriented to person, place, and time. She appears well-developed and well-nourished. No distress.  HENT:  Head: Normocephalic and atraumatic.  Mouth/Throat: Oropharynx is clear and moist.  Eyes: Conjunctivae and EOM are normal. Pupils are equal, round, and reactive to light.  Neck: Normal range of motion. Neck supple.  Cardiovascular: Normal rate and normal heart sounds.   No murmur heard. Pulmonary/Chest: Effort normal and breath sounds normal. She has no wheezes.  Abdominal: Soft. Bowel sounds are normal. There is no tenderness.  Musculoskeletal: Normal range of motion. She exhibits no edema.  Neurological: She is alert and oriented to person,  place, and time. No cranial nerve deficit. She exhibits normal muscle tone. Coordination normal.  Skin: Skin is warm. No rash noted.    ED Course  Procedures (including critical care time)  Labs Review Labs Reviewed  I-STAT CHEM 8, ED - Abnormal; Notable for the following:    Potassium 3.5 (*)    Glucose, Bld 120 (*)    All other components within normal limits  CBC WITH DIFFERENTIAL   Imaging Review No results found.   EKG Interpretation   Date/Time:  Saturday August 03 2013 21:08:54 EST Ventricular Rate:  75 PR Interval:  152 QRS Duration: 96 QT Interval:  395 QTC Calculation: 441 R Axis:   45 Text Interpretation:  Sinus rhythm Abnormal R-wave progression, early  transition No significant change since last tracing Confirmed by Tarick Parenteau   MD, Cayton Cuevas [phone]) on 08/03/2013 9:17:23 PM      MDM   Final diagnoses:  Allergic reaction     patient with known history to pain in allergies quite pronounced. Had an allergic reaction after eating some chicken not sure what it was seized and width. Patient had itching all over thing that tightness in her throat and felt as if she was given a pass out and had trouble breathing. Patient used an EpiPen at home. Also took 50 mg of Benadryl. EMS called they came out and route patient was given Zantac by mouth. Patient still with hoarseness some mild throat tightness never had any lip or tongue swelling breathing feels better. Patient treated in emergency department with IV Solu-Medrol. Re: had maximum dose of Benadryl and oriented H2-blocker on the way in. Patient will be continued on Benadryl for the next 2 days Pepcid for the next 7 days prednisone for the next 5 days and EpiPen prescription renewed. Patient nontoxic no acute distress at time of discharge.    Shelda Jakes, MD 08/03/13 2255

## 2013-08-03 NOTE — ED Notes (Signed)
Per EMS , pt. From home with complaint of allergic reaction after eating grilled chicken seasoned with " paprika" which pt. Claimed allergic to. Claimed of SOB as reaction and hoarseness of voice, pt. Gave herself  Epipen and benadryl tab 50mg  and had relief SOB. Received Zantac 50mg  tab per EMS and claimed relief of SOB. On roomair upon arrival ,sat @98 %. Denies any pain but still claimed of " throat feels like swollen".

## 2013-08-05 ENCOUNTER — Ambulatory Visit: Payer: Managed Care, Other (non HMO) | Admitting: Internal Medicine

## 2013-08-05 ENCOUNTER — Ambulatory Visit (INDEPENDENT_AMBULATORY_CARE_PROVIDER_SITE_OTHER): Payer: Managed Care, Other (non HMO) | Admitting: Internal Medicine

## 2013-08-05 ENCOUNTER — Encounter: Payer: Self-pay | Admitting: Internal Medicine

## 2013-08-05 VITALS — BP 120/90 | HR 78 | Temp 98.3°F | Resp 15 | Wt 230.6 lb

## 2013-08-05 DIAGNOSIS — Z91018 Allergy to other foods: Secondary | ICD-10-CM

## 2013-08-05 DIAGNOSIS — Z91010 Allergy to peanuts: Secondary | ICD-10-CM

## 2013-08-05 MED ORDER — ALBUTEROL SULFATE HFA 108 (90 BASE) MCG/ACT IN AERS: 2.0000 | INHALATION_SPRAY | Freq: Four times a day (QID) | RESPIRATORY_TRACT | Status: DC | PRN

## 2013-08-05 MED ORDER — PREDNISONE 20 MG PO TABS: ORAL_TABLET | ORAL | Status: DC

## 2013-08-05 MED ORDER — BUDESONIDE-FORMOTEROL FUMARATE 80-4.5 MCG/ACT IN AERO: 2.0000 | INHALATION_SPRAY | Freq: Two times a day (BID) | RESPIRATORY_TRACT | Status: DC

## 2013-08-05 MED ORDER — HYDROXYZINE HCL 10 MG PO TABS: 10.0000 mg | ORAL_TABLET | Freq: Four times a day (QID) | ORAL | Status: DC | PRN

## 2013-08-05 NOTE — Progress Notes (Signed)
Pre visit review using our clinic review tool, if applicable. No additional management support is needed unless otherwise documented below in the visit note. 

## 2013-08-05 NOTE — Progress Notes (Signed)
Subjective:    Patient ID: Megan Wilkerson, female    DOB: [birth year], 48 y.o.   MRN: [id number]  HPI  She was exposed to peanuts 07/31/13 while on jury duty. She had to use her EpiPen and was seen in the emergency room.  She's been on prednisone and Benadryl after ER visit.   08/03/13 she had itching in her throat and chest tightening and difficulty breathing after eating chicken season with papricka  Since that time she's had an intermittent wheezing as well as fatigue. She also has urticarial lesions on extremities.    Review of Systems    She denies frontal headache, facial pain, nasal purulence, cough, or sputum production.  She also denies fever, chills, or sweats.     Objective:   Physical Exam General appearance:good health ;well nourished; no acute distress or increased work of breathing is present.  No  lymphadenopathy about the head, neck, or axilla noted.   Eyes: No conjunctival inflammation or lid edema is present. There is no scleral icterus.  Ears:  External ear exam shows no significant lesions or deformities.  Otoscopic examination reveals clear canals, tympanic membranes are intact bilaterally without bulging, retraction, inflammation or discharge.  Nose:  External nasal examination shows no deformity or inflammation. Nasal mucosa are pink and moist without lesions or exudates. No septal dislocation or deviation.No obstruction to airflow.   Oral exam: Dental hygiene is good; lips and gums are healthy appearing.There is no oropharyngeal erythema or exudate noted.   Neck:  No deformities, thyromegaly, masses, or tenderness noted.   Supple with full range of motion without pain.   Heart:  Normal rate and regular rhythm. S1 and S2 normal without gallop, murmur, click, rub or other extra sounds.   Lungs:Chest clear to auscultation; no wheezes, rhonchi,rales ,or rubs present.No increased work of breathing.    Extremities:  No cyanosis, edema, or clubbing  noted     Skin: Warm & dry w/o jaundice or tenting. Linear urticaria over the forearms.         Assessment & Plan:  #1 allergic reaction to sneezing following reaction to peanuts  Plan: See orders

## 2013-08-05 NOTE — Patient Instructions (Addendum)
Take the ranitidine 150 mg  30 minutes before breakfast and evening meal IN PLACE OF OMEPRAZOLE.  Take a  20 mg dose of prednisone twice a for 5 days and then decrease to one half pill twice a day.

## 2013-08-06 ENCOUNTER — Ambulatory Visit: Payer: Managed Care, Other (non HMO) | Admitting: Physician Assistant

## 2013-08-06 NOTE — ED Provider Notes (Signed)
Medical screening examination/treatment/procedure(s) were conducted as a shared visit with non-physician practitioner(s) and myself.  I personally evaluated the patient during the encounter.   EKG Interpretation None      Well appearing. Doing better. Dc home. Airway patent  Lyanne CoKevin M Maurizio Geno, MD 08/06/13 [phone]

## 2013-08-12 ENCOUNTER — Encounter: Payer: Self-pay | Admitting: Internal Medicine

## 2013-08-12 ENCOUNTER — Other Ambulatory Visit: Payer: Managed Care, Other (non HMO)

## 2013-08-12 ENCOUNTER — Ambulatory Visit (INDEPENDENT_AMBULATORY_CARE_PROVIDER_SITE_OTHER): Payer: Managed Care, Other (non HMO) | Admitting: Internal Medicine

## 2013-08-12 ENCOUNTER — Encounter: Payer: Self-pay | Admitting: Gastroenterology

## 2013-08-12 VITALS — BP 124/72 | HR 93 | Ht 66.0 in | Wt 234.2 lb

## 2013-08-12 DIAGNOSIS — Z91018 Allergy to other foods: Secondary | ICD-10-CM

## 2013-08-12 DIAGNOSIS — K219 Gastro-esophageal reflux disease without esophagitis: Secondary | ICD-10-CM

## 2013-08-12 NOTE — Progress Notes (Signed)
03/16/12- [name] seen in allergy consultation at kind request of  Dr Landry Mellow allergy labs; had reaction to peanuts on Sat-had to use epipen. Husband here. Does not want flu shot. She does not consider herself an allergy person. She  needed Benadryl for urticaria for the first time one month ago. She was given an EpiPen then. On October 12 of this year she got into a car where a friend had been eating peanuts. Her throat began to swell. She was treated with prednisone and used her EpiPen. She says her throat is no better-pain and hoarseness. She has history of GERD but denies asthma or seasonal allergies. No history ENT surgery. CBC- 01/31/12- Eos 5.2 Allergy profiles 01/31/2012-total IgE 912 with specific elevations for many common inhalant allergens and foods. She has been trying to avoid the foods that were listed for her but has not recognized symptoms with any since the exposure to someone eating peanuts.  05/09/12- [name] seen in allergy consultation FOLLOWS FOR: pt reports doing well today, x3 days ago reacted to peanuts at work --SOB, tongue swelling and wheezing used epi pen and symptoms have started improving today She felt fine 3 days ago when she hugged her friend and caught a smell of something. She assumed  friend had been eating peanuts but never asked her. Within a few minutes she began coughing and wheezing with tight throat. She used her EpiPen then went to emergency room. Now taking prednisone as directed. She still has some throat tickle and cough. Skin itches. Taking Zyrtec. Claritin makes her sleepy. Office spirometry-05/09/2012 normal spirometry-FVC 3.34/107%, FEV1 2.56/100%, FEV1/FEC 0.76, FEF 25-75% 2.20/69%.  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 2 weeks ago had conversation with parent to eating peanuts. While talking, her  throat began to tickle, tongue began to swell and throat felt closed. It helped to use EpiPen. One day ago, while working in [employer], same pattern. Again the school nurse had to give EpiPen and she was sent to [place]. She had felt tight in the chest going to work that day and had used rescue inhaler, Symbicort and her daily Allegra. She has been wearing a mask at work. She wants to work away from [employer] where peanut butter is be by the students. She doesn't recognize reflux or heartburn but I discussed this as a possible contribution to the pattern especially if she gets anxious.  10/25/12- [name] seen in allergy consultation FOLLOWS FOR: still raspy in voice; unsure what will happen at work due to allergy. 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 She reports some wheeze. Stays hoarse without painful swallow.Was on prednisone 10 mg daily x 10 days, ending 1 week ago, but took a prednisone last night because of wheezing.  Using azelastine nasal spray. Husband says her chronic snoring is worse. She stays "tired". We discussed possible sleep apnea. Barium swallow was normal. She does not feel reflux.   10/31/12 [name] followed for allergic rhinitis, food allergy(peanut) No antihistamines, OTC  cough syrups, or OTC sleep aids x 3 days EXCEPT- Dymista last night, 17 hours ago. Dymista has helped some- less stuffy and less drainage, but no change in hoarseness. She continues Pepcid twice daily with no awareness of reflux. Trying to wear a mask outdoors. No  acute events/ peanut exposures since last here. Pending now for ENT evaluation for hoarseness and NPSG for snoring and tiredness.  Allergy Skin Test  10/31/12- Significant Positive for grass, weed tree pollens, dust mite, given allegra at end.  12/14/12- [name] followed for allergic rhinitis, food allergy(peanut) 6 wk follow up.  Symptoms are  unchanged.  Has hoarseness, prod cough with green mucus, a little wheezing, itchy eyes, runny nose.   Dr. Stasia Cavalier gave her omeprazole to use at bedtime. Barium swallow was negative and she is not aware of reflux. Complains of head congestion, dizziness and itching in throat. Unclear about the effect of weather changes. Occasional green on some days. Feels postnasal drip despite Dymista nasal spray. 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 Weight 232 pounds.  02/11/13- [name] followed for allergic rhinitis, food allergy(peanut), OSA FOLLOWS FOR: almost passed out in yard-was taking the trash out and blacked out(june 2014);Pt states that she is unable to work due to this. Dr Annalee Genta ENT attributed hoarseness to GERD, Rx'd omeprazole. Describes 2 episodes syncope w/o HA, palpitation, trauma. Woke spontaneously. Did not seek attention. Notes wheeze if bending over for housework. Continues Symbicort, proventil. We will want to try CPAP when she is able to cope.  08/12/13-[name] followed for allergic rhinitis, food allergy(peanut), OSA FOLLOWS FOR: 06-27-13 had attack at home(used epipen)-had eaten ceral-Honey nut Cheerios(had almonds in it). Went to ER.  [place]-exposed to peanuts-someone was eating peanut butter crackers-epipen used and 911 called. Almonds cause hoarseness and itching of throat. Peanut butter cracker near her triggered syncope. Eating chicken with paprika made throat itch and feel tight. Week is blaming for cough and loose stool. Occasional food hanging up. Frequent heartburn on Pepcid twice daily.  ROS-see HPI Constitutional:   No-   weight loss, night sweats, fevers, chills, +fatigue, lassitude. HEENT:   No-  headaches, +difficulty swallowing, tooth/dental problems, sore throat, +hoarseness      No-  sneezing, itching, no- ear ache, +nasal congestion,  +post nasal drip,  CV:  No-   chest pain, orthopnea, PND, swelling in lower extremities, anasarca,  dizziness, palpitations Resp: No-   shortness of breath with exertion or at rest.              No-   productive cough,  + non-productive cough,  No- coughing up of blood.              No-change in color of mucus.  N+ wheezing.   Skin: No-   rash or lesions. GI:  +  heartburn, indigestion, abdominal pain, nausea, vomiting,  GU: n. MS:  No-   joint pain or swelling.   Neuro-  +syncope Psych:  No- change in mood or affect. No depression or anxiety.  No memory loss.  OBJ- Physical Exam General- Alert, Oriented, Affect-appropriate, Distress- none acute Skin- rash-none, lesions- none, excoriation- none Lymphadenopathy- none Head- atraumatic            Eyes- Gross vision intact, PERRLA, conjunctivae and secretions clear            Ears- Hearing, canals-normal            Nose- + sniffing, no-Septal dev, mucus, polyps, erosion, perforation             Throat- Mallampati III-IV , +mucosa-red, drainage- none, tonsils- atrophic, +hoarse unchanged Neck- flexible , trachea midline,  no stridor , thyroid nl, carotid no bruit Chest - symmetrical excursion , unlabored           Heart/CV- RRR , no murmur , no gallop  , no rub, nl s1 s2                           - JVD- none , edema- none, stasis changes- none, varices- none           Lung- clear to P&A, cough-none, dullness-none, rub- none           Chest wall-  Abd-  Br/ Gen/ Rectal- Not done, not indicated Extrem- cyanosis- none, clubbing, none, atrophy- none, strength- nl Neuro- grossly intact to observation

## 2013-08-12 NOTE — Patient Instructions (Signed)
Order- lab- Nut panel profile   Dx food allergy  Order- GI referral     GERD, question eosinophilic esophagitis  Try changing your routine maintenance antihistamine from loratadine to Allegra 180/ fexofenadine

## 2013-08-13 LAB — NUT MIX PROFILE
Brazil Nut: <0.1 kU/L
HAZELNUT: 0.1 kU/L — AB
PECAN NUT: 0.15 kU/L — AB
Peanut IgE: 0.12 kU/L — ABNORMAL HIGH
Pistachio  IgE: 0.14 kU/L — ABNORMAL HIGH
WALNUT: 0.19 kU/L — AB

## 2013-08-28 NOTE — Assessment & Plan Note (Signed)
Reflux symptoms and food hangup suggest possibility of eosinophilic esophagitis. Emphasized avoidance of trigger foods, pretreat with antihistamines, availability of EpiPen Plan refer to GI, daily Allegra, lab Nut panel

## 2013-09-24 ENCOUNTER — Telehealth: Payer: Self-pay | Admitting: Gastroenterology

## 2013-09-24 ENCOUNTER — Ambulatory Visit: Payer: Managed Care, Other (non HMO) | Admitting: Gastroenterology

## 2013-09-24 NOTE — Telephone Encounter (Signed)
No do not charge  

## 2013-10-14 ENCOUNTER — Encounter (HOSPITAL_COMMUNITY): Payer: Self-pay | Admitting: Emergency Medicine

## 2013-10-14 ENCOUNTER — Emergency Department (HOSPITAL_COMMUNITY)
Admission: EM | Admit: 2013-10-14 | Discharge: 2013-10-15 | Disposition: A | Discharge status: Home / Self Care | Payer: Managed Care, Other (non HMO) | Attending: Emergency Medicine | Admitting: Emergency Medicine

## 2013-10-14 DIAGNOSIS — R221 Localized swelling, mass and lump, neck: Secondary | ICD-10-CM

## 2013-10-14 DIAGNOSIS — Y929 Unspecified place or not applicable: Secondary | ICD-10-CM | POA: Insufficient documentation

## 2013-10-14 DIAGNOSIS — Z79899 Other long term (current) drug therapy: Secondary | ICD-10-CM | POA: Insufficient documentation

## 2013-10-14 DIAGNOSIS — T7840XA Allergy, unspecified, initial encounter: Secondary | ICD-10-CM

## 2013-10-14 DIAGNOSIS — R220 Localized swelling, mass and lump, head: Secondary | ICD-10-CM | POA: Insufficient documentation

## 2013-10-14 DIAGNOSIS — Z8669 Personal history of other diseases of the nervous system and sense organs: Secondary | ICD-10-CM | POA: Insufficient documentation

## 2013-10-14 DIAGNOSIS — Z862 Personal history of diseases of the blood and blood-forming organs and certain disorders involving the immune mechanism: Secondary | ICD-10-CM | POA: Insufficient documentation

## 2013-10-14 DIAGNOSIS — K219 Gastro-esophageal reflux disease without esophagitis: Secondary | ICD-10-CM | POA: Insufficient documentation

## 2013-10-14 DIAGNOSIS — Y9389 Activity, other specified: Secondary | ICD-10-CM | POA: Insufficient documentation

## 2013-10-14 DIAGNOSIS — IMO0002 Reserved for concepts with insufficient information to code with codable children: Secondary | ICD-10-CM | POA: Insufficient documentation

## 2013-10-14 DIAGNOSIS — T628X1A Toxic effect of other specified noxious substances eaten as food, accidental (unintentional), initial encounter: Secondary | ICD-10-CM | POA: Insufficient documentation

## 2013-10-14 DIAGNOSIS — T781XXA Other adverse food reactions, not elsewhere classified, initial encounter: Secondary | ICD-10-CM | POA: Insufficient documentation

## 2013-10-14 DIAGNOSIS — Z8639 Personal history of other endocrine, nutritional and metabolic disease: Secondary | ICD-10-CM | POA: Insufficient documentation

## 2013-10-14 DIAGNOSIS — Z8679 Personal history of other diseases of the circulatory system: Secondary | ICD-10-CM | POA: Insufficient documentation

## 2013-10-14 DIAGNOSIS — L299 Pruritus, unspecified: Secondary | ICD-10-CM | POA: Insufficient documentation

## 2013-10-14 MED ORDER — DEXAMETHASONE SODIUM PHOSPHATE 10 MG/ML IJ SOLN
10.0000 mg | Freq: Once | INTRAMUSCULAR | Status: AC
  Administered 2013-10-14: 10 mg via INTRAVENOUS
  Filled 2013-10-14: qty 1

## 2013-10-14 MED ORDER — FAMOTIDINE IN NACL 20-0.9 MG/50ML-% IV SOLN
20.0000 mg | Freq: Once | INTRAVENOUS | Status: AC
  Administered 2013-10-14: 20 mg via INTRAVENOUS
  Filled 2013-10-14: qty 50

## 2013-10-14 NOTE — ED Notes (Signed)
Patient states that she was eating jolly rancher jelly beans and then her throat began to feel scratchy and that it began to close. She has benadryl elixir at home and she took 25mg . Patient states symptoms persisted so she then used her epi-pen. She states she was somewhat relieved after the epi-pen, but is still itching.

## 2013-10-14 NOTE — ED Notes (Signed)
Bed: WA05 Expected date:  Expected time:  Means of arrival:  Comments: EMS allergic reaction 

## 2013-10-15 MED ORDER — EPINEPHRINE 0.3 MG/0.3ML IJ SOAJ: 0.3000 mg | INTRAMUSCULAR | Status: DC | PRN

## 2013-10-15 MED ORDER — FAMOTIDINE 20 MG PO TABS: 20.0000 mg | ORAL_TABLET | Freq: Two times a day (BID) | ORAL | Status: DC

## 2013-10-15 MED ORDER — PREDNISONE 20 MG PO TABS: 60.0000 mg | ORAL_TABLET | Freq: Every day | ORAL | Status: DC

## 2013-10-15 NOTE — ED Provider Notes (Signed)
Medical screening examination/treatment/procedure(s) were performed by non-physician practitioner and as supervising physician I was immediately available for consultation/collaboration.   EKG Interpretation None        Decarlos Empey, MD 10/15/13 0345 

## 2013-10-15 NOTE — Discharge Instructions (Signed)
Allergies °Allergies may happen from anything your body is sensitive to. This may be food, medicines, pollens, chemicals, and nearly anything around you in everyday life that produces allergens. An allergen is anything that causes an allergy producing substance. Heredity is often a factor in causing these problems. This means you may have some of the same allergies as your parents. °Food allergies happen in all age groups. Food allergies are some of the most severe and life threatening. Some common food allergies are cow's milk, seafood, eggs, nuts, wheat, and soybeans. °SYMPTOMS  °· Swelling around the mouth. °· An itchy red rash or hives. °· Vomiting or diarrhea. °· Difficulty breathing. °SEVERE ALLERGIC REACTIONS ARE LIFE-THREATENING. °This reaction is called anaphylaxis. It can cause the mouth and throat to swell and cause difficulty with breathing and swallowing. In severe reactions only a trace amount of food (for example, peanut oil in a salad) may cause death within seconds. °Seasonal allergies occur in all age groups. These are seasonal because they usually occur during the same season every year. They may be a reaction to molds, grass pollens, or tree pollens. Other causes of problems are house dust mite allergens, pet dander, and mold spores. The symptoms often consist of nasal congestion, a runny itchy nose associated with sneezing, and tearing itchy eyes. There is often an associated itching of the mouth and ears. The problems happen when you come in contact with pollens and other allergens. Allergens are the particles in the air that the body reacts to with an allergic reaction. This causes you to release allergic antibodies. Through a chain of events, these eventually cause you to release histamine into the blood stream. Although it is meant to be protective to the body, it is this release that causes your discomfort. This is why you were given anti-histamines to feel better.  If you are unable to  pinpoint the offending allergen, it may be determined by skin or blood testing. Allergies cannot be cured but can be controlled with medicine. °Hay fever is a collection of all or some of the seasonal allergy problems. It may often be treated with simple over-the-counter medicine such as diphenhydramine. Take medicine as directed. Do not drink alcohol or drive while taking this medicine. Check with your caregiver or package insert for child dosages. °If these medicines are not effective, there are many new medicines your caregiver can prescribe. Stronger medicine such as nasal spray, eye drops, and corticosteroids may be used if the first things you try do not work well. Other treatments such as immunotherapy or desensitizing injections can be used if all else fails. Follow up with your caregiver if problems continue. These seasonal allergies are usually not life threatening. They are generally more of a nuisance that can often be handled using medicine. °HOME CARE INSTRUCTIONS  °· If unsure what causes a reaction, keep a diary of foods eaten and symptoms that follow. Avoid foods that cause reactions. °· If hives or rash are present: °· Take medicine as directed. °· You may use an over-the-counter antihistamine (diphenhydramine) for hives and itching as needed. °· Apply cold compresses (cloths) to the skin or take baths in cool water. Avoid hot baths or showers. Heat will make a rash and itching worse. °· If you are severely allergic: °· Following a treatment for a severe reaction, hospitalization is often required for closer follow-up. °· Wear a medic-alert bracelet or necklace stating the allergy. °· You and your family must learn how to give adrenaline or use   an anaphylaxis kit. °· If you have had a severe reaction, always carry your anaphylaxis kit or EpiPen® with you. Use this medicine as directed by your caregiver if a severe reaction is occurring. Failure to do so could have a fatal outcome. °SEEK MEDICAL  CARE IF: °· You suspect a food allergy. Symptoms generally happen within 30 minutes of eating a food. °· Your symptoms have not gone away within 2 days or are getting worse. °· You develop new symptoms. °· You want to retest yourself or your child with a food or drink you think causes an allergic reaction. Never do this if an anaphylactic reaction to that food or drink has happened before. Only do this under the care of a caregiver. °SEEK IMMEDIATE MEDICAL CARE IF:  °· You have difficulty breathing, are wheezing, or have a tight feeling in your chest or throat. °· You have a swollen mouth, or you have hives, swelling, or itching all over your body. °· You have had a severe reaction that has responded to your anaphylaxis kit or an EpiPen®. These reactions may return when the medicine has worn off. These reactions should be considered life threatening. °MAKE SURE YOU:  °· Understand these instructions. °· Will watch your condition. °· Will get help right away if you are not doing well or get worse. °Document Released: 08/09/2002 Document Revised: 09/10/2012 Document Reviewed: 01/14/2008 °ExitCare® Patient Information ©2014 [employer]. ° °Epinephrine Injection °Epinephrine is a medicine given by injection to temporarily treat an emergency allergic reaction. It is also used to treat severe asthmatic attacks and other lung problems. The medicine helps to enlarge (dilate) the small breathing tubes of the lungs. A life-threatening, sudden allergic reaction that involves the whole body is called anaphylaxis. Because of potential side effects, epinephrine should only be used as directed by your caregiver. °RISKS AND COMPLICATIONS °Possible side effects of epinephrine injections include: °· Chest pain. °· Irregular or rapid heartbeat. °· Shortness of breath. °· Nausea. °· Vomiting. °· Abdominal pain or cramping. °· Sweating. °· Dizziness. °· Weakness. °· Headache. °· Nervousness. °Report all side effects to your  caregiver. °HOW TO GIVE AN EPINEPHRINE INJECTION °Give the epinephrine injection immediately when symptoms of a severe reaction begin. Inject the medicine into the outer thigh or any available, large muscle. Your caregiver can teach you how to do this. You do not need to remove any clothing. After the injection, call your local emergency services (911 in [place] Even if you improve after the injection, you need to be examined at a hospital emergency department. Epinephrine works quickly, but it also wears off quickly. Delayed reactions can occur. A delayed reaction may be as serious and dangerous as the initial reaction. °HOME CARE INSTRUCTIONS °· Make sure you and your family know how to give an epinephrine injection. °· Use epinephrine injections as directed by your caregiver. Do not use this medicine more often or in larger doses than prescribed. °· Always carry your epinephrine injection or anaphylaxis kit with you. This can be lifesaving if you have a severe reaction. °· Store the medicine in a cool, dry place. If the medicine becomes discolored or cloudy, dispose of it properly and replace it with new medicine. °· Check the expiration date on your medicine. It may be unsafe to use medicines past their expiration date. °· Tell your caregiver about any other medicines you are taking. Some medicines can react badly with epinephrine. °· Tell your caregiver about any medical conditions you have, such as   diabetes, high blood pressure (hypertension), heart disease, irregular heartbeats, or if you are pregnant. °SEEK IMMEDIATE MEDICAL CARE IF: °· You have used an epinephrine injection. Call your local emergency services (911 in [place] Even if you improve after the injection, you need to be examined at a hospital emergency department to make sure your allergic reaction is under control. You will also be monitored for adverse effects from the medicine. °· You have chest pain. °· You have irregular or fast  heartbeats. °· You have shortness of breath. °· You have severe headaches. °· You have severe nausea, vomiting, or abdominal cramps. °· You have severe pain, swelling, or redness in the area where you gave the injection. °Document Released: 05/13/2000 Document Revised: 08/08/2011 Document Reviewed: 02/02/2011 °ExitCare® Patient Information ©2014 [employer]. ° °

## 2013-10-15 NOTE — ED Provider Notes (Signed)
CSN: [id number]     Arrival date & time 10/14/13  2240 History   First MD Initiated Contact with Patient 10/14/13 2255     Chief Complaint  Patient presents with  . Allergic Reaction     (Consider location/radiation/quality/duration/timing/severity/associated sxs/prior Treatment) HPI Comments: Patient is 48 year old female with history of allergies who states that this evening she was eating jolly rancher jelly beans when she began to notice throat scratchiness and itching - she reports that she took benadryl at that time and she began to feel like the throat was closing - she denies rash, itching, nausea, vomiting, chest pain or shortness of breath or wheezing.  She states that she took her epipen and reports mild relief of symptoms.  Patient is a 48 y.o. female presenting with allergic reaction. The history is provided by the patient. No language interpreter was used.  Allergic Reaction Presenting symptoms: itching and swelling   Presenting symptoms: no difficulty breathing and no wheezing   Severity:  Moderate Prior allergic episodes:  Unable to specify Context: food   Context: no grass, no medications, no new detergents/soaps and no nuts   Relieved by:  Nothing Worsened by:  Nothing tried Ineffective treatments:  Antihistamines and epinephrine   Past Medical History  Diagnosis Date  . ANEMIA-NOS   . GERD   . HYPERLIPIDEMIA, WITH LOW HDL   . POSITIVE PPD 1987    s/p 63mo tx  . MIGRAINE HEADACHE   . PERIPHERAL NEUROPATHY    Past Surgical History  Procedure Laterality Date  . Appendectomy  1992  . Abdominal hysterectomy  1996  . Cesarean section  1989  . Cholecystectomy  04/22/2011    Procedure: LAPAROSCOPIC CHOLECYSTECTOMY WITH INTRAOPERATIVE CHOLANGIOGRAM;  Surgeon: Almond LintFaera Byerly, MD;  Location: [facility] OR;  Service: General;  Laterality: N/A;   Family History  Problem Relation Age of Onset  . Diabetes Mother   . Prostate cancer Father   . Hypertension Father   . Heart  disease Father   . Diabetes Brother   . Alcohol abuse Other   . Arthritis Other   . Hypertension Other    History  Substance Use Topics  . Smoking status: Never Smoker   . Smokeless tobacco: Never Used  . Alcohol Use: No   OB History   Grav Para Term Preterm Abortions TAB SAB Ect Mult Living   5 5        5      Review of Systems  Respiratory: Negative for wheezing.   Skin: Positive for itching.  All other systems reviewed and are negative.     Allergies  Other; Peanuts; Corn dextrin; Gluten meal; Shellfish allergy; Soy allergy; Sunflower oil; Tomato; Wheat bran; and Morphine  Home Medications   Prior to Admission medications   Medication Sig Start Date End Date Taking? Authorizing Provider  brimonidine (ALPHAGAN) 0.15 % ophthalmic solution Place 1 drop into both eyes 2 (two) times daily.   Yes Historical Provider, MD  budesonide-formoterol (SYMBICORT) 80-4.5 MCG/ACT inhaler Inhale 2 puffs into the lungs 2 (two) times daily. 08/05/13  Yes Pecola LawlessWilliam F Hopper, MD  diphenhydrAMINE (BENADRYL) 25 mg capsule Take 25 mg by mouth 3 (three) times daily as needed for itching (reactions).  06/23/12  Yes Gerhard Munchobert Lockwood, MD  EPINEPHrine (EPI-PEN) 0.3 mg/0.3 mL DEVI Inject 0.3 mg into the muscle once as needed (for severe allergic reaction). CAll 911 immediately if you have to use this medicine 08/15/12  Yes Hannah Muthersbaugh, PA-C  fexofenadine [facility](ALLEGRA)  180 MG tablet Take 180 mg by mouth daily.   Yes Historical Provider, MD  fluticasone (FLONASE) 50 MCG/ACT nasal spray Place 2 sprays into the nose daily as needed for allergies.  08/20/12  Yes Newt Lukes, MD  hydrOXYzine (ATARAX/VISTARIL) 10 MG tablet Take 1 tablet (10 mg total) by mouth every 6 (six) hours as needed. 08/05/13  Yes Pecola Lawless, MD  montelukast (SINGULAIR) 10 MG tablet Take 1 tablet (10 mg total) by mouth at bedtime. 09/07/12  Yes Waymon Budge, MD  omeprazole (PRILOSEC) 40 MG capsule Take 40 mg by mouth daily.   Yes  Historical Provider, MD  pyridOXINE (VITAMIN B-6) 100 MG tablet Take 100 mg by mouth daily.    Yes Historical Provider, MD  albuterol (PROVENTIL HFA;VENTOLIN HFA) 108 (90 BASE) MCG/ACT inhaler Inhale 2 puffs into the lungs every 6 (six) hours as needed for wheezing. 08/05/13   Pecola Lawless, MD  EPINEPHrine (EPIPEN) 0.3 mg/0.3 mL IJ SOAJ injection Inject 0.3 mLs (0.3 mg total) into the muscle as needed. 10/15/13   Izola Price. Ayad Nieman, PA-C  famotidine (PEPCID) 20 MG tablet Take 1 tablet (20 mg total) by mouth 2 (two) times daily. 10/15/13   Izola Price. Shaunee Mulkern, PA-C  predniSONE (DELTASONE) 20 MG tablet Take 3 tablets (60 mg total) by mouth daily with breakfast. 10/15/13   Izola Price. Lesa Vandall, PA-C   BP 133/75  Pulse 94  Temp(Src) 99.7 F (37.6 C) (Oral)  Resp 13  SpO2 100% Physical Exam  Nursing note and vitals reviewed. Constitutional: She is oriented to person, place, and time. She appears well-developed and well-nourished. No distress.  HENT:  Head: Normocephalic and atraumatic.  Right Ear: External ear normal.  Left Ear: External ear normal.  Nose: Nose normal.  Mouth/Throat: Oropharynx is clear and moist. No oropharyngeal exudate.  No stridor or angioedema noted  Eyes: Conjunctivae are normal. Pupils are equal, round, and reactive to light. No scleral icterus.  Neck: Normal range of motion. Neck supple.  Cardiovascular: Normal rate, regular rhythm and normal heart sounds.  Exam reveals no gallop and no friction rub.   No murmur heard. Pulmonary/Chest: Effort normal and breath sounds normal. No stridor. No respiratory distress. She has no wheezes. She has no rales. She exhibits no tenderness.  Abdominal: Soft. Bowel sounds are normal. She exhibits no distension. There is no tenderness. There is no rebound and no guarding.  Musculoskeletal: Normal range of motion. She exhibits no edema and no tenderness.  Lymphadenopathy:    She has no cervical adenopathy.  Neurological: She is alert  and oriented to person, place, and time. She exhibits normal muscle tone. Coordination normal.  Skin: Skin is warm and dry. No rash noted. No erythema. No pallor.  Psychiatric: She has a normal mood and affect. Her behavior is normal. Judgment and thought content normal.    ED Course  Procedures (including critical care time) Labs Review Labs Reviewed - No data to display  Imaging Review No results found.   EKG Interpretation None     Medications  famotidine (PEPCID) IVPB 20 mg (20 mg Intravenous New Bag/Given 10/14/13 2333)  dexamethasone (DECADRON) injection 10 mg (10 mg Intravenous Given 10/14/13 2333)    MDM   Final diagnoses:  Allergic reaction     Patient here with allergic reaction - likely due to the dye in the jellybeans but unknown at this time - she is markedly improved with administration of medication - have refilled epipen as well and  will continue on steroids.  Patient is in agreement with the plan.   Izola PriceFrances C. Marisue HumbleSanford, PA-C 10/15/13 [phone]

## 2013-11-13 ENCOUNTER — Ambulatory Visit: Payer: Managed Care, Other (non HMO) | Admitting: Gastroenterology

## 2013-11-13 ENCOUNTER — Telehealth: Payer: Self-pay | Admitting: Gastroenterology

## 2013-11-13 NOTE — Telephone Encounter (Signed)
Do you want to charge? 

## 2013-11-13 NOTE — Telephone Encounter (Signed)
Should charge for this SAME day cancellation

## 2013-12-27 ENCOUNTER — Emergency Department (HOSPITAL_COMMUNITY)
Admission: EM | Admit: 2013-12-27 | Discharge: 2013-12-28 | Disposition: A | Discharge status: Home / Self Care | Payer: Managed Care, Other (non HMO) | Attending: Emergency Medicine | Admitting: Emergency Medicine

## 2013-12-27 ENCOUNTER — Encounter (HOSPITAL_COMMUNITY): Payer: Self-pay | Admitting: Emergency Medicine

## 2013-12-27 DIAGNOSIS — Z8679 Personal history of other diseases of the circulatory system: Secondary | ICD-10-CM | POA: Insufficient documentation

## 2013-12-27 DIAGNOSIS — R498 Other voice and resonance disorders: Secondary | ICD-10-CM | POA: Insufficient documentation

## 2013-12-27 DIAGNOSIS — Z79899 Other long term (current) drug therapy: Secondary | ICD-10-CM | POA: Insufficient documentation

## 2013-12-27 DIAGNOSIS — IMO0002 Reserved for concepts with insufficient information to code with codable children: Secondary | ICD-10-CM | POA: Insufficient documentation

## 2013-12-27 DIAGNOSIS — Z862 Personal history of diseases of the blood and blood-forming organs and certain disorders involving the immune mechanism: Secondary | ICD-10-CM | POA: Insufficient documentation

## 2013-12-27 DIAGNOSIS — Z8669 Personal history of other diseases of the nervous system and sense organs: Secondary | ICD-10-CM | POA: Insufficient documentation

## 2013-12-27 DIAGNOSIS — Z8639 Personal history of other endocrine, nutritional and metabolic disease: Secondary | ICD-10-CM | POA: Insufficient documentation

## 2013-12-27 DIAGNOSIS — T7840XA Allergy, unspecified, initial encounter: Secondary | ICD-10-CM

## 2013-12-27 DIAGNOSIS — I10 Essential (primary) hypertension: Secondary | ICD-10-CM | POA: Insufficient documentation

## 2013-12-27 DIAGNOSIS — K219 Gastro-esophageal reflux disease without esophagitis: Secondary | ICD-10-CM | POA: Insufficient documentation

## 2013-12-27 MED ORDER — METHYLPREDNISOLONE SODIUM SUCC 125 MG IJ SOLR
125.0000 mg | Freq: Once | INTRAMUSCULAR | Status: AC
  Administered 2013-12-27: 125 mg via INTRAVENOUS
  Filled 2013-12-27: qty 2

## 2013-12-27 MED ORDER — EPINEPHRINE 0.3 MG/0.3ML IJ SOAJ: 0.3000 mg | INTRAMUSCULAR | Status: DC | PRN

## 2013-12-27 MED ORDER — FAMOTIDINE IN NACL 20-0.9 MG/50ML-% IV SOLN
20.0000 mg | Freq: Once | INTRAVENOUS | Status: AC
  Administered 2013-12-27: 20 mg via INTRAVENOUS
  Filled 2013-12-27: qty 50

## 2013-12-27 MED ORDER — DIPHENHYDRAMINE HCL 50 MG/ML IJ SOLN
50.0000 mg | Freq: Once | INTRAMUSCULAR | Status: AC
  Administered 2013-12-27: 50 mg via INTRAVENOUS
  Filled 2013-12-27: qty 1

## 2013-12-27 MED ORDER — SODIUM CHLORIDE 0.9 % IV BOLUS (SEPSIS)
500.0000 mL | Freq: Once | INTRAVENOUS | Status: AC
  Administered 2013-12-28: 500 mL via INTRAVENOUS

## 2013-12-27 MED ORDER — FAMOTIDINE 20 MG PO TABS: 20.0000 mg | ORAL_TABLET | Freq: Two times a day (BID) | ORAL | Status: DC

## 2013-12-27 MED ORDER — SODIUM CHLORIDE 0.9 % IV BOLUS (SEPSIS)
1000.0000 mL | Freq: Once | INTRAVENOUS | Status: AC
  Administered 2013-12-27: 1000 mL via INTRAVENOUS

## 2013-12-27 MED ORDER — DIPHENHYDRAMINE HCL 25 MG PO TABS: 25.0000 mg | ORAL_TABLET | Freq: Four times a day (QID) | ORAL | Status: DC

## 2013-12-27 MED ORDER — PREDNISONE 10 MG PO TABS: 20.0000 mg | ORAL_TABLET | Freq: Every day | ORAL | Status: DC

## 2013-12-27 MED ORDER — LORATADINE 10 MG PO TABS: 10.0000 mg | ORAL_TABLET | Freq: Every day | ORAL | Status: DC

## 2013-12-27 NOTE — ED Provider Notes (Signed)
CSN: [id number]     Arrival date & time 12/27/13  2056 History   First MD Initiated Contact with Patient 12/27/13 2100     Chief Complaint  Patient presents with  . Allergic Reaction     (Consider location/radiation/quality/duration/timing/severity/associated sxs/prior Treatment) HPI  Patient presents to the emergency department after having an allergic reaction to unknown substance. She has a lot of allergies and her severe. She reports meeting her EpiPen 8 time the past 2 years. She reports going into her daughter's house and quickly becoming short of breath. She took her EpiPen and significantly her symptoms improved. She now is not having any shortness of breath but her voice is horse, which she reports is normal for her after using her Epi Pen. She says that she will need a refill before she goes. She has no other complaints, denies neck, tongue, lip swelling. Denies rash, abdominal pain, or headache. Pt in nad. Vitals signs are WNL  Past Medical History  Diagnosis Date  . GERD   . HYPERLIPIDEMIA, WITH LOW HDL   . POSITIVE PPD 1987    s/p 29mo tx  . MIGRAINE HEADACHE   . PERIPHERAL NEUROPATHY    Past Surgical History  Procedure Laterality Date  . Appendectomy  1992  . Abdominal hysterectomy  1996  . Cesarean section  1989  . Cholecystectomy  04/22/2011    Procedure: LAPAROSCOPIC CHOLECYSTECTOMY WITH INTRAOPERATIVE CHOLANGIOGRAM;  Surgeon: Almond Lint, MD;  Location: [facility] OR;  Service: General;  Laterality: N/A;   Family History  Problem Relation Age of Onset  . Diabetes Mother   . Prostate cancer Father   . Hypertension Father   . Heart disease Father   . Diabetes Brother   . Alcohol abuse Other   . Arthritis Other   . Hypertension Other    History  Substance Use Topics  . Smoking status: Never Smoker   . Smokeless tobacco: Never Used  . Alcohol Use: No   OB History   Grav Para Term Preterm Abortions TAB SAB Ect Mult Living   5 5        5      Review of  Systems   Review of Systems  Gen: no weight loss, fevers, chills, night sweats  Eyes: no discharge or drainage, no occular pain or visual changes  Nose: no epistaxis or rhinorrhea  Mouth: no dental pain, no sore throat  Neck: no neck pain  Lungs:No wheezing or hemoptysis No coughing + SOB CV:  No palpitations, dependent edema or orthopnea. No chest pain Abd: no diarrhea. No nausea or vomiting, No abdominal pain  GU: no dysuria or gross hematuria  MSK:  No muscle weakness, No  pain Neuro: no headache, no focal neurologic deficits  Skin: no rash , no wounds Psyche: no complaints    Allergies  Other; Peanuts; Corn dextrin; Gluten meal; Shellfish allergy; Soy allergy; Sunflower oil; Tomato; Wheat bran; and Morphine  Home Medications   Prior to Admission medications   Medication Sig Start Date End Date Taking? Authorizing Provider  albuterol (PROVENTIL HFA;VENTOLIN HFA) 108 (90 BASE) MCG/ACT inhaler Inhale 2 puffs into the lungs every 6 (six) hours as needed for wheezing. 08/05/13   Pecola Lawless, MD  brimonidine (ALPHAGAN) 0.15 % ophthalmic solution Place 1 drop into both eyes 2 (two) times daily.    Historical Provider, MD  budesonide-formoterol (SYMBICORT) 80-4.5 MCG/ACT inhaler Inhale 2 puffs into the lungs 2 (two) times daily. 08/05/13   Pecola Lawless, MD  diphenhydrAMINE (BENADRYL) 25 mg capsule Take 25 mg by mouth 3 (three) times daily as needed for itching (reactions).  06/23/12   Gerhard Munch, MD  EPINEPHrine (EPI-PEN) 0.3 mg/0.3 mL DEVI Inject 0.3 mg into the muscle once as needed (for severe allergic reaction). CAll 911 immediately if you have to use this medicine 08/15/12   [facility] Muthersbaugh, PA-C  EPINEPHrine (EPIPEN) 0.3 mg/0.3 mL IJ SOAJ injection Inject 0.3 mLs (0.3 mg total) into the muscle as needed. 10/15/13   Izola Price. Sanford, PA-C  famotidine (PEPCID) 20 MG tablet Take 1 tablet (20 mg total) by mouth 2 (two) times daily. 10/15/13   Izola Price. Sanford, PA-C   fexofenadine (ALLEGRA) 180 MG tablet Take 180 mg by mouth daily.    Historical Provider, MD  fluticasone (FLONASE) 50 MCG/ACT nasal spray Place 2 sprays into the nose daily as needed for allergies.  08/20/12   Newt Lukes, MD  hydrOXYzine (ATARAX/VISTARIL) 10 MG tablet Take 1 tablet (10 mg total) by mouth every 6 (six) hours as needed. 08/05/13   Pecola Lawless, MD  montelukast (SINGULAIR) 10 MG tablet Take 1 tablet (10 mg total) by mouth at bedtime. 09/07/12   Waymon Budge, MD  omeprazole (PRILOSEC) 40 MG capsule Take 40 mg by mouth daily.    Historical Provider, MD  predniSONE (DELTASONE) 20 MG tablet Take 3 tablets (60 mg total) by mouth daily with breakfast. 10/15/13   Izola Price. Sanford, PA-C  pyridOXINE (VITAMIN B-6) 100 MG tablet Take 100 mg by mouth daily.     Historical Provider, MD   BP 127/109  Pulse 96  Temp(Src) 98.4 F (36.9 C) (Oral)  Resp 20  SpO2 99% Physical Exam  Nursing note and vitals reviewed. Constitutional: She appears well-developed and well-nourished. No distress.  Voice is horse  HENT:  Head: Normocephalic and atraumatic.  Eyes: Pupils are equal, round, and reactive to light.  Neck: Normal range of motion. Neck supple.  Cardiovascular: Normal rate and regular rhythm.   Pulmonary/Chest: Effort normal. No respiratory distress. She has no wheezes. She has no rales. She exhibits no tenderness.  Abdominal: Soft.  Neurological: She is alert.  Skin: Skin is warm and dry. No rash noted. Rash is not urticarial.    ED Course  Procedures (including critical care time) Labs Review Labs Reviewed - No data to display  Imaging Review No results found.   EKG Interpretation None      MDM   Final diagnoses:  None    Medications  famotidine (PEPCID) IVPB 20 mg (20 mg Intravenous New Bag/Given 12/27/13 2114)  sodium chloride 0.9 % bolus 1,000 mL (1,000 mLs Intravenous New Bag/Given 12/27/13 2114)  methylPREDNISolone sodium succinate (SOLU-MEDROL) 125  mg/2 mL injection 125 mg (125 mg Intravenous Given 12/27/13 2114)  diphenhydrAMINE (BENADRYL) injection 50 mg (50 mg Intravenous Given 12/27/13 2114)    Pt placed on monitor. EKG ordered. Medications ordered. Patient doing very well and will be monitored for the recommended 3-4 hour period.  Pt watched in the Ed for almost 4 hours. She continued to improve and is feeling well. Will discharge home at this time.  Prescriptions for home:  diphenhydrAMINE (BENADRYL) 25 MG tablet Take 1 tablet (25 mg total) by mouth every 6 (six) hours. 20 tablet Dorthula Matas, PA-C   EPINEPHrine (EPIPEN) 0.3 mg/0.3 mL IJ SOAJ injection Inject 0.3 mLs (0.3 mg total) into the muscle as needed. 1 Device Norinne Jeane Irine Seal, PA-C   famotidine (PEPCID) 20 MG  tablet Take 1 tablet (20 mg total) by mouth 2 (two) times daily. 30 tablet Dorthula Matasiffany G Kayliegh Boyers, PA-C   loratadine (CLARITIN) 10 MG tablet Take 1 tablet (10 mg total) by mouth daily. 14 tablet Dakia Schifano Irine SealG Aby Gessel, PA-C  predniSONE (DELTASONE) 10 MG tablet Take 2 tablets (20 mg total) by mouth daily. 21 tablet Dorthula Matasiffany G Glenna Brunkow, PA-C  48 y.o.Geoffery Lyonsrnestine M Dancer's evaluation in the Emergency Department is complete. It has been determined that no acute conditions requiring further emergency intervention are present at this time. The patient/guardian have been advised of the diagnosis and plan. We have discussed signs and symptoms that warrant return to the ED, such as changes or worsening in symptoms.  Vital signs are stable at discharge. Filed Vitals:   12/27/13 2241  BP: 120/80  Pulse: 73  Temp:   Resp: 18    Patient/guardian has voiced understanding and agreed to follow-up with the PCP or specialist.   Dorthula Matasiffany G Cayman Kielbasa, PA-C 12/28/13 [id number]

## 2013-12-27 NOTE — ED Notes (Addendum)
Per EMS- patient has numerous allergies. Patient thinks she is allergic to deodorizer spray used in her daughter's house before she arrived. Not sure if spray had a peanut oil or if related to one of her allergies. Used epi pen prior to EMS arrival. Reports she was "almost completely unable to breathe before EMS got here." Had 4 mg Zofran prior to arrival. VS: BP  165/95 HR 98 RR 20 SpO2 97% on RA.

## 2013-12-27 NOTE — ED Notes (Signed)
PA ok for patient to have sips of water.

## 2013-12-27 NOTE — Discharge Instructions (Signed)
Allergies °Allergies may happen from anything your body is sensitive to. This may be food, medicines, pollens, chemicals, and nearly anything around you in everyday life that produces allergens. An allergen is anything that causes an allergy producing substance. Heredity is often a factor in causing these problems. This means you may have some of the same allergies as your parents. °Food allergies happen in all age groups. Food allergies are some of the most severe and life threatening. Some common food allergies are cow's milk, seafood, eggs, nuts, wheat, and soybeans. °SYMPTOMS  °· Swelling around the mouth. °· An itchy red rash or hives. °· Vomiting or diarrhea. °· Difficulty breathing. °SEVERE ALLERGIC REACTIONS ARE LIFE-THREATENING. °This reaction is called anaphylaxis. It can cause the mouth and throat to swell and cause difficulty with breathing and swallowing. In severe reactions only a trace amount of food (for example, peanut oil in a salad) may cause death within seconds. °Seasonal allergies occur in all age groups. These are seasonal because they usually occur during the same season every year. They may be a reaction to molds, grass pollens, or tree pollens. Other causes of problems are house dust mite allergens, pet dander, and mold spores. The symptoms often consist of nasal congestion, a runny itchy nose associated with sneezing, and tearing itchy eyes. There is often an associated itching of the mouth and ears. The problems happen when you come in contact with pollens and other allergens. Allergens are the particles in the air that the body reacts to with an allergic reaction. This causes you to release allergic antibodies. Through a chain of events, these eventually cause you to release histamine into the blood stream. Although it is meant to be protective to the body, it is this release that causes your discomfort. This is why you were given anti-histamines to feel better.  If you are unable to  pinpoint the offending allergen, it may be determined by skin or blood testing. Allergies cannot be cured but can be controlled with medicine. °Hay fever is a collection of all or some of the seasonal allergy problems. It may often be treated with simple over-the-counter medicine such as diphenhydramine. Take medicine as directed. Do not drink alcohol or drive while taking this medicine. Check with your caregiver or package insert for child dosages. °If these medicines are not effective, there are many new medicines your caregiver can prescribe. Stronger medicine such as nasal spray, eye drops, and corticosteroids may be used if the first things you try do not work well. Other treatments such as immunotherapy or desensitizing injections can be used if all else fails. Follow up with your caregiver if problems continue. These seasonal allergies are usually not life threatening. They are generally more of a nuisance that can often be handled using medicine. °HOME CARE INSTRUCTIONS  °· If unsure what causes a reaction, keep a diary of foods eaten and symptoms that follow. Avoid foods that cause reactions. °· If hives or rash are present: °¨ Take medicine as directed. °¨ You may use an over-the-counter antihistamine (diphenhydramine) for hives and itching as needed. °¨ Apply cold compresses (cloths) to the skin or take baths in cool water. Avoid hot baths or showers. Heat will make a rash and itching worse. °· If you are severely allergic: °¨ Following a treatment for a severe reaction, hospitalization is often required for closer follow-up. °¨ Wear a medic-alert bracelet or necklace stating the allergy. °¨ You and your family must learn how to give adrenaline or use   an anaphylaxis kit. °¨ If you have had a severe reaction, always carry your anaphylaxis kit or EpiPen® with you. Use this medicine as directed by your caregiver if a severe reaction is occurring. Failure to do so could have a fatal outcome. °SEEK MEDICAL  CARE IF: °· You suspect a food allergy. Symptoms generally happen within 30 minutes of eating a food. °· Your symptoms have not gone away within 2 days or are getting worse. °· You develop new symptoms. °· You want to retest yourself or your child with a food or drink you think causes an allergic reaction. Never do this if an anaphylactic reaction to that food or drink has happened before. Only do this under the care of a caregiver. °SEEK IMMEDIATE MEDICAL CARE IF:  °· You have difficulty breathing, are wheezing, or have a tight feeling in your chest or throat. °· You have a swollen mouth, or you have hives, swelling, or itching all over your body. °· You have had a severe reaction that has responded to your anaphylaxis kit or an EpiPen®. These reactions may return when the medicine has worn off. These reactions should be considered life threatening. °MAKE SURE YOU:  °· Understand these instructions. °· Will watch your condition. °· Will get help right away if you are not doing well or get worse. °Document Released: 08/09/2002 Document Revised: 09/10/2012 Document Reviewed: 01/14/2008 °[employer]® Patient Information ©2015 [employer]. This information is not intended to replace advice given to you by your health care provider. Make sure you discuss any questions you have with your health care provider. °Epinephrine injection (Auto-injector) °What is this medicine? °EPINEPHRINE (ep i NEF rin) is used for the emergency treatment of severe allergic reactions. You should keep this medicine with you at all times. °This medicine may be used for other purposes; ask your health care provider or pharmacist if you have questions. °COMMON BRAND NAME(S): Adrenaclick, Auvi-Q, EpiPen, Twinject °What should I tell my health care provider before I take this medicine? °They need to know if you have any of the following conditions: °-diabetes °-heart disease °-high blood pressure °-lung or breathing disease, like  asthma °-Parkinson's disease °-thyroid disease °-an unusual or allergic reaction to epinephrine, sulfites, other medicines, foods, dyes, or preservatives °-pregnant or trying to get pregnant °-breast-feeding °How should I use this medicine? °This medicine is for injection into the outer thigh. Your doctor or health care professional will instruct you on the proper use of the device during an emergency. Read all directions carefully and make sure you understand them. Do not use more often than directed. °Talk to your pediatrician regarding the use of this medicine in children. Special care may be needed. This drug is commonly used in children. A special device is available for use in children. °Overdosage: If you think you have taken too much of this medicine contact a poison control center or emergency room at once. °NOTE: This medicine is only for you. Do not share this medicine with others. °What if I miss a dose? °This does not apply. You should only use this medicine for an allergic reaction. °What may interact with this medicine? °This medicine is only used during an emergency. Significant drug interactions are not likely during emergency use. °This list may not describe all possible interactions. Give your health care provider a list of all the medicines, herbs, non-prescription drugs, or dietary supplements you use. Also tell them if you smoke, drink alcohol, or use illegal drugs. Some items may interact   with your medicine. °What should I watch for while using this medicine? °Keep this medicine ready for use in the case of a severe allergic reaction. Make sure that you have the phone number of your doctor or health care professional and local hospital ready. Remember to check the expiration date of your medicine regularly. You may need to have additional units of this medicine with you at work, school, or other places. Talk to your doctor or health care professional about your need for extra units. Some  emergencies may require an additional dose. Check with your doctor or a health care professional before using an extra dose. °After use, go to the nearest hospital or call 911. Avoid physical activity. Make sure the treating health care professional knows you have received an injection of this medicine. You will receive additional instructions on what to do during and after use of this medicine before a medical emergency occurs. °What side effects may I notice from receiving this medicine? °Side effects that you should report to your doctor or health care professional as soon as possible: °-allergic reactions like skin rash, itching or hives, swelling of the face, lips, or tongue °-breathing problems °-chest pain °-flushing °-irregular or pounding heartbeat °-numbness in fingers or toes °-vomiting °Side effects that usually do not require medical attention (report to your doctor or health care professional if they continue or are bothersome): °-anxiety or nervousness °-dizzy, drowsy °-dry mouth °-headache °-increased sweating °-nausea °-tired, weak °This list may not describe all possible side effects. Call your doctor for medical advice about side effects. You may report side effects to FDA at 1-800-FDA-1088. °Where should I keep my medicine? °Keep out of the reach of children. °Store at room temperature between 15 and 30 degrees C (59 and 86 degrees F). Protect from light and heat. The solution should be clear in color. If the solution is discolored or contains particles it must be replaced. Throw away any unused medicine after the expiration date. Ask your doctor or pharmacist about proper disposal of the injector if it is expired or has been used. Always replace your auto-injector before it expires. °NOTE: This sheet is a summary. It may not cover all possible information. If you have questions about this medicine, talk to your doctor, pharmacist, or health care provider. °© 2015, [employer].  (2012-09-24 14:59:01) ° °

## 2013-12-27 NOTE — ED Notes (Signed)
Bed: WA17 Expected date:  Expected time:  Means of arrival:  Comments: EMS/allergic reaction to unknown/used epi pen

## 2014-01-06 NOTE — ED Provider Notes (Signed)
Medical screening examination/treatment/procedure(s) were performed by non-physician practitioner and as supervising physician I was immediately available for consultation/collaboration.   EKG Interpretation   Date/Time:  Friday December 27 2013 21:27:34 EDT Ventricular Rate:  88 PR Interval:  169 QRS Duration: 94 QT Interval:  400 QTC Calculation: 484 R Axis:   27 Text Interpretation:  Sinus rhythm Borderline T wave abnormalities No  significant change since last tracing Confirmed by Blinda LeatherwoodPOLLINA  MD,  CHRISTOPHER [phone]([id number]) on 12/27/2013 9:34:10 PM        Gilda Creasehristopher J. Pollina, MD 01/06/14 [phone]

## 2014-02-04 ENCOUNTER — Encounter (HOSPITAL_COMMUNITY): Payer: Self-pay | Admitting: Emergency Medicine

## 2014-02-04 ENCOUNTER — Emergency Department (HOSPITAL_COMMUNITY)
Admission: EM | Admit: 2014-02-04 | Discharge: 2014-02-04 | Disposition: A | Discharge status: Home / Self Care | Payer: Managed Care, Other (non HMO) | Attending: Emergency Medicine | Admitting: Emergency Medicine

## 2014-02-04 DIAGNOSIS — Y9389 Activity, other specified: Secondary | ICD-10-CM | POA: Diagnosis not present

## 2014-02-04 DIAGNOSIS — I10 Essential (primary) hypertension: Secondary | ICD-10-CM | POA: Diagnosis not present

## 2014-02-04 DIAGNOSIS — Y92410 Unspecified street and highway as the place of occurrence of the external cause: Secondary | ICD-10-CM | POA: Insufficient documentation

## 2014-02-04 DIAGNOSIS — IMO0002 Reserved for concepts with insufficient information to code with codable children: Secondary | ICD-10-CM | POA: Insufficient documentation

## 2014-02-04 DIAGNOSIS — K219 Gastro-esophageal reflux disease without esophagitis: Secondary | ICD-10-CM | POA: Diagnosis not present

## 2014-02-04 DIAGNOSIS — R0989 Other specified symptoms and signs involving the circulatory and respiratory systems: Principal | ICD-10-CM | POA: Insufficient documentation

## 2014-02-04 DIAGNOSIS — R0609 Other forms of dyspnea: Secondary | ICD-10-CM | POA: Diagnosis present

## 2014-02-04 DIAGNOSIS — R1310 Dysphagia, unspecified: Secondary | ICD-10-CM | POA: Diagnosis not present

## 2014-02-04 DIAGNOSIS — Z79899 Other long term (current) drug therapy: Secondary | ICD-10-CM | POA: Diagnosis not present

## 2014-02-04 DIAGNOSIS — Z8669 Personal history of other diseases of the nervous system and sense organs: Secondary | ICD-10-CM | POA: Diagnosis not present

## 2014-02-04 DIAGNOSIS — Z043 Encounter for examination and observation following other accident: Secondary | ICD-10-CM | POA: Insufficient documentation

## 2014-02-04 DIAGNOSIS — Z8639 Personal history of other endocrine, nutritional and metabolic disease: Secondary | ICD-10-CM | POA: Insufficient documentation

## 2014-02-04 DIAGNOSIS — T7840XA Allergy, unspecified, initial encounter: Secondary | ICD-10-CM

## 2014-02-04 DIAGNOSIS — Z862 Personal history of diseases of the blood and blood-forming organs and certain disorders involving the immune mechanism: Secondary | ICD-10-CM | POA: Insufficient documentation

## 2014-02-04 DIAGNOSIS — Z8679 Personal history of other diseases of the circulatory system: Secondary | ICD-10-CM | POA: Diagnosis not present

## 2014-02-04 MED ORDER — DIPHENHYDRAMINE HCL 50 MG/ML IJ SOLN
25.0000 mg | Freq: Once | INTRAMUSCULAR | Status: AC
  Administered 2014-02-04: 25 mg via INTRAVENOUS
  Filled 2014-02-04: qty 1

## 2014-02-04 MED ORDER — METHYLPREDNISOLONE SODIUM SUCC 125 MG IJ SOLR
125.0000 mg | Freq: Once | INTRAMUSCULAR | Status: AC
  Administered 2014-02-04: 125 mg via INTRAVENOUS
  Filled 2014-02-04: qty 2

## 2014-02-04 MED ORDER — FAMOTIDINE IN NACL 20-0.9 MG/50ML-% IV SOLN
20.0000 mg | Freq: Once | INTRAVENOUS | Status: AC
  Administered 2014-02-04: 20 mg via INTRAVENOUS
  Filled 2014-02-04: qty 50

## 2014-02-04 NOTE — ED Notes (Signed)
Bed: WA24 Expected date:  Expected time:  Means of arrival:  Comments: Hold per charge 

## 2014-02-04 NOTE — ED Notes (Signed)
Bed: [facility] Expected date:  Expected time:  Means of arrival:  Comments: EMS - Allergic Reaction - Stable-

## 2014-02-04 NOTE — Discharge Instructions (Signed)
Allergies °Allergies may happen from anything your body is sensitive to. This may be food, medicines, pollens, chemicals, and nearly anything around you in everyday life that produces allergens. An allergen is anything that causes an allergy producing substance. Heredity is often a factor in causing these problems. This means you may have some of the same allergies as your parents. °Food allergies happen in all age groups. Food allergies are some of the most severe and life threatening. Some common food allergies are cow's milk, seafood, eggs, nuts, wheat, and soybeans. °SYMPTOMS  °· Swelling around the mouth. °· An itchy red rash or hives. °· Vomiting or diarrhea. °· Difficulty breathing. °SEVERE ALLERGIC REACTIONS ARE LIFE-THREATENING. °This reaction is called anaphylaxis. It can cause the mouth and throat to swell and cause difficulty with breathing and swallowing. In severe reactions only a trace amount of food (for example, peanut oil in a salad) may cause death within seconds. °Seasonal allergies occur in all age groups. These are seasonal because they usually occur during the same season every year. They may be a reaction to molds, grass pollens, or tree pollens. Other causes of problems are house dust mite allergens, pet dander, and mold spores. The symptoms often consist of nasal congestion, a runny itchy nose associated with sneezing, and tearing itchy eyes. There is often an associated itching of the mouth and ears. The problems happen when you come in contact with pollens and other allergens. Allergens are the particles in the air that the body reacts to with an allergic reaction. This causes you to release allergic antibodies. Through a chain of events, these eventually cause you to release histamine into the blood stream. Although it is meant to be protective to the body, it is this release that causes your discomfort. This is why you were given anti-histamines to feel better.  If you are unable to  pinpoint the offending allergen, it may be determined by skin or blood testing. Allergies cannot be cured but can be controlled with medicine. °Hay fever is a collection of all or some of the seasonal allergy problems. It may often be treated with simple over-the-counter medicine such as diphenhydramine. Take medicine as directed. Do not drink alcohol or drive while taking this medicine. Check with your caregiver or package insert for child dosages. °If these medicines are not effective, there are many new medicines your caregiver can prescribe. Stronger medicine such as nasal spray, eye drops, and corticosteroids may be used if the first things you try do not work well. Other treatments such as immunotherapy or desensitizing injections can be used if all else fails. Follow up with your caregiver if problems continue. These seasonal allergies are usually not life threatening. They are generally more of a nuisance that can often be handled using medicine. °HOME CARE INSTRUCTIONS  °· If unsure what causes a reaction, keep a diary of foods eaten and symptoms that follow. Avoid foods that cause reactions. °· If hives or rash are present: °¨ Take medicine as directed. °¨ You may use an over-the-counter antihistamine (diphenhydramine) for hives and itching as needed. °¨ Apply cold compresses (cloths) to the skin or take baths in cool water. Avoid hot baths or showers. Heat will make a rash and itching worse. °· If you are severely allergic: °¨ Following a treatment for a severe reaction, hospitalization is often required for closer follow-up. °¨ Wear a medic-alert bracelet or necklace stating the allergy. °¨ You and your family must learn how to give adrenaline or use   an anaphylaxis kit.  If you have had a severe reaction, always carry your anaphylaxis kit or EpiPen with you. Use this medicine as directed by your caregiver if a severe reaction is occurring. Failure to do so could have a fatal outcome. SEEK MEDICAL  CARE IF:  You suspect a food allergy. Symptoms generally happen within 30 minutes of eating a food.  Your symptoms have not gone away within 2 days or are getting worse.  You develop new symptoms.  You want to retest yourself or your child with a food or drink you think causes an allergic reaction. Never do this if an anaphylactic reaction to that food or drink has happened before. Only do this under the care of a caregiver. SEEK IMMEDIATE MEDICAL CARE IF:   You have difficulty breathing, are wheezing, or have a tight feeling in your chest or throat.  You have a swollen mouth, or you have hives, swelling, or itching all over your body.  You have had a severe reaction that has responded to your anaphylaxis kit or an EpiPen. These reactions may return when the medicine has worn off. These reactions should be considered life threatening. MAKE SURE YOU:   Understand these instructions.  Will watch your condition.  Will get help right away if you are not doing well or get worse. Document Released: 08/09/2002 Document Revised: 09/10/2012 Document Reviewed: 01/14/2008 [facility] Patient Information 2015 [place], [place]. This information is not intended to replace advice given to you by your health care provider. Make sure you discuss any questions you have with your health care provider. Anaphylactic Reaction An anaphylactic reaction is a sudden, severe allergic reaction. It affects the whole body. It can be life threatening. You may need to stay in the hospital.  [place] a medical bracelet or necklace that lists your allergy.  Carry your allergy kit or medicine shot to treat severe allergic reactions with you. These can save your life.  Do not drive until medicine from your shot has worn off, unless your doctor says it is okay.  If you have hives or a rash:  Take medicine as told by your doctor.  You may take over-the-counter antihistamine medicine.  Place cold cloths on  your skin. Take baths in cool water. Avoid hot baths and hot showers. GET HELP RIGHT AWAY IF:   Your mouth is puffy (swollen), or you have trouble breathing.  You start making whistling sounds when you breathe (wheezing).  You have a tight feeling in your chest or throat.  You have a rash, hives, puffiness, or itching on your body.  You throw up (vomit) or have watery poop (diarrhea).  You feel dizzy or pass out (faint).  You think you are having an allergic reaction.  You have new symptoms. This is an emergency. Use your medicine shot or allergy kit as told. Call your local emergency services (911 in [place] Even if you feel better after the shot, you need to go to the hospital emergency department. MAKE SURE YOU:   Understand these instructions.  Will watch your condition.  Will get help right away if you are not doing well or get worse. Document Released: 11/02/2007 Document Revised: 11/15/2011 Document Reviewed: 08/17/2011 [facility] Patient Information 2015 [place], [place]. This information is not intended to replace advice given to you by your health care provider. Make sure you discuss any questions you have with your health care provider.
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
## 2014-02-04 NOTE — ED Provider Notes (Signed)
Medical screening examination/treatment/procedure(s) were performed by non-physician practitioner and as supervising physician I was immediately available for consultation/collaboration.   EKG Interpretation None       Gilda Crease, MD 02/04/14 [phone]
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
## 2014-03-31 ENCOUNTER — Encounter (HOSPITAL_COMMUNITY): Payer: Self-pay | Admitting: Emergency Medicine

## 2014-04-10 ENCOUNTER — Ambulatory Visit (INDEPENDENT_AMBULATORY_CARE_PROVIDER_SITE_OTHER): Payer: Managed Care, Other (non HMO) | Admitting: Internal Medicine

## 2014-04-10 ENCOUNTER — Encounter: Payer: Self-pay | Admitting: Internal Medicine

## 2014-04-10 VITALS — BP 120/80 | HR 95 | Temp 97.3°F | Resp 16 | Ht 66.0 in | Wt 226.0 lb

## 2014-04-10 DIAGNOSIS — J45901 Unspecified asthma with (acute) exacerbation: Secondary | ICD-10-CM | POA: Insufficient documentation

## 2014-04-10 DIAGNOSIS — T7840XA Allergy, unspecified, initial encounter: Secondary | ICD-10-CM

## 2014-04-10 DIAGNOSIS — J4521 Mild intermittent asthma with (acute) exacerbation: Secondary | ICD-10-CM

## 2014-04-10 MED ORDER — BUDESONIDE-FORMOTEROL FUMARATE 80-4.5 MCG/ACT IN AERO: 2.0000 | INHALATION_SPRAY | Freq: Two times a day (BID) | RESPIRATORY_TRACT | Status: DC

## 2014-04-10 MED ORDER — METHYLPREDNISOLONE ACETATE 80 MG/ML IJ SUSP
80.0000 mg | Freq: Once | INTRAMUSCULAR | Status: AC
  Administered 2014-04-10: 80 mg via INTRAMUSCULAR

## 2014-04-10 MED ORDER — ALBUTEROL SULFATE 108 (90 BASE) MCG/ACT IN AEPB: 1.0000 | INHALATION_SPRAY | Freq: Four times a day (QID) | RESPIRATORY_TRACT | Status: DC

## 2014-04-10 MED ORDER — METHYLPREDNISOLONE ACETATE 40 MG/ML IJ SUSP
40.0000 mg | Freq: Once | INTRAMUSCULAR | Status: AC
  Administered 2014-04-10: 40 mg via INTRA_ARTICULAR

## 2014-04-10 NOTE — Progress Notes (Signed)
Pre visit review using our clinic review tool, if applicable. No additional management support is needed unless otherwise documented below in the visit note. 

## 2014-04-10 NOTE — Patient Instructions (Signed)
Food Allergy A food allergy occurs from eating something you are sensitive to. Food allergies occur in all age groups. It may be passed to you from your parents (heredity).  CAUSES  Some common causes are cow's milk, seafood, eggs, nuts (including peanut butter), wheat, and soybeans. SYMPTOMS  Common problems are:   Swelling around the mouth.  An itchy, red rash.  Hives.  Vomiting.  Diarrhea. Severe allergic reactions are life-threatening. This reaction is called anaphylaxis. It can cause the mouth and throat to swell. This makes it hard to breathe and swallow. In severe reactions, only a small amount of food may be fatal within seconds. HOME CARE INSTRUCTIONS   If you are unsure what caused the reaction, keep a diary of foods eaten and symptoms that followed. Avoid foods that cause reactions.  If hives or rash are present:  Take medicines as directed.  Use an over-the-counter antihistamine (diphenhydramine) to treat hives and itching as needed.  Apply cold compresses to the skin or take baths in cool water. Avoid hot baths or showers. These will increase the redness and itching.  If you are severely allergic:  Hospitalization is often required following a severe reaction.  Wear a medical alert bracelet or necklace that describes the allergy.  Carry your anaphylaxis kit or epinephrine injection with you at all times. Both you and your family members should know how to use this. This can be lifesaving if you have a severe reaction. If epinephrine is used, it is important for you to seek immediate medical care or call your local emergency services (911 in [place] When the epinephrine wears off, it can be followed by a delayed reaction, which can be fatal.  Replace your epinephrine immediately after use in case of another reaction.  Ask your caregiver for instructions if you have not been taught how to use an epinephrine injection.  Do not drive until medicines used to treat the  reaction have worn off, unless approved by your caregiver. SEEK MEDICAL CARE IF:   You suspect a food allergy. Symptoms generally happen within 30 minutes of eating a food.  Your symptoms have not gone away within 2 days. See your caregiver sooner if symptoms are getting worse.  You develop new symptoms.  You want to retest yourself with a food or drink you think causes an allergic reaction. Never do this if an anaphylactic reaction to that food or drink has happened before.  There is a return of the symptoms which brought you to your caregiver. SEEK IMMEDIATE MEDICAL CARE IF:   You have trouble breathing, are wheezing, or you have a tight feeling in your chest or throat.  You have a swollen mouth, or you have hives, swelling, or itching all over your body. Use your epinephrine injection immediately. This is given into the outside of your thigh, deep into the muscle. Following use of the epinephrine injection, seek help right away. Seek immediate medical care or call your local emergency services (911 in [place] MAKE SURE YOU:   Understand these instructions.  Will watch your condition.  Will get help right away if you are not doing well or get worse. Document Released: 05/13/2000 Document Revised: 08/08/2011 Document Reviewed: 01/03/2008 [employer] Patient Information 2015 [employer]. This information is not intended to replace advice given to you by your health care provider. Make sure you discuss any questions you have with your health care provider.  

## 2014-04-13 NOTE — Assessment & Plan Note (Signed)
Her exam is normal today but she reports persistent symptoms so will start steroids She will cont using her current inhalers as well

## 2014-04-13 NOTE — Progress Notes (Signed)
Subjective:    Patient ID: Megan Wilkerson, female    DOB: [birth year], 48 y.o.   MRN: [id number]  Allergic Reaction This is a new problem. The current episode started 3 to 5 days ago. The problem occurs intermittently. The problem has been gradually improving since onset. The patient was exposed to food (peanuts). The time of exposure was just prior to onset. Associated symptoms include coughing, itching, a rash and wheezing. Pertinent negatives include no abdominal pain, chest pain, chest pressure, diarrhea, difficulty breathing, drooling, eye itching, eye redness, eye watering, globus sensation, hyperventilation, stridor, trouble swallowing or vomiting. (S/s started a few days ago, she has some rash and itching but that has resolved.) Past treatments include diphenhydramine. The treatment provided moderate relief. Her past medical history is significant for asthma, food allergies and seasonal allergies. There is no history of atopic dermatitis or medication allergies.      Review of Systems  Constitutional: Negative.  Negative for fever, chills, diaphoresis, appetite change and fatigue.  HENT: Positive for voice change (mild laryngitis). Negative for drooling, postnasal drip, rhinorrhea, sinus pressure, sneezing and trouble swallowing.   Eyes: Negative.  Negative for redness and itching.  Respiratory: Positive for cough and wheezing. Negative for choking, shortness of breath and stridor.   Cardiovascular: Negative.  Negative for chest pain, palpitations and leg swelling.  Gastrointestinal: Negative.  Negative for nausea, vomiting, abdominal pain, diarrhea and constipation.  Endocrine: Negative.   Genitourinary: Negative.   Musculoskeletal: Negative.   Skin: Positive for itching and rash. Negative for color change.  Allergic/Immunologic: Positive for food allergies.  Neurological: Negative.   Hematological: Negative.  Negative for adenopathy. Does not bruise/bleed easily.    Psychiatric/Behavioral: Negative.        Objective:   Physical Exam  Constitutional: She is oriented to person, place, and time. She appears well-developed and well-nourished.  Non-toxic appearance. She does not have a sickly appearance. She does not appear ill. No distress.  HENT:  Head: Normocephalic and atraumatic.  Mouth/Throat: Oropharynx is clear and moist and mucous membranes are normal. Mucous membranes are not pale, not dry and not cyanotic. No oral lesions. No uvula swelling. No oropharyngeal exudate, posterior oropharyngeal edema, posterior oropharyngeal erythema or tonsillar abscesses.  Eyes: Conjunctivae are normal. Right eye exhibits no discharge. Left eye exhibits no discharge. No scleral icterus.  Neck: Normal range of motion. Neck supple. No JVD present. No tracheal deviation present. No thyromegaly present.  Cardiovascular: Normal rate, regular rhythm, normal heart sounds and intact distal pulses.  Exam reveals no gallop and no friction rub.   No murmur heard. Pulmonary/Chest: Effort normal and breath sounds normal. No accessory muscle usage or stridor. No respiratory distress. She has no decreased breath sounds. She has no wheezes. She has no rhonchi. She has no rales. She exhibits no tenderness.  Abdominal: Soft. Bowel sounds are normal. She exhibits no distension and no mass. There is no tenderness. There is no rebound and no guarding.  Musculoskeletal: Normal range of motion. She exhibits no edema or tenderness.  Lymphadenopathy:    She has no cervical adenopathy.  Neurological: She is oriented to person, place, and time.  Skin: Skin is warm and dry. No rash noted. She is not diaphoretic. No erythema. No pallor.  Vitals reviewed.    Lab Results  Component Value Date   WBC 6.1 08/03/2013   HGB 13.9 08/03/2013   HCT 41.0 08/03/2013   PLT 236 08/03/2013   GLUCOSE 120* 08/03/2013   CHOL  185 09/10/2009   TRIG 101.0 09/10/2009   HDL 36.40* 09/10/2009   LDLCALC  128* 09/10/2009   ALT 16 08/29/2012   AST 14 08/29/2012   NA 143 08/03/2013   K 3.5* 08/03/2013   CL 103 08/03/2013   CREATININE 1.10 08/03/2013   BUN 11 08/03/2013   CO2 28 08/29/2012   TSH 0.79 08/29/2012   HGBA1C 6.0 08/21/2008       Assessment & Plan:

## 2014-04-28 ENCOUNTER — Ambulatory Visit (INDEPENDENT_AMBULATORY_CARE_PROVIDER_SITE_OTHER): Payer: Managed Care, Other (non HMO) | Admitting: Internal Medicine

## 2014-04-28 ENCOUNTER — Encounter: Payer: Self-pay | Admitting: Internal Medicine

## 2014-04-28 ENCOUNTER — Ambulatory Visit (INDEPENDENT_AMBULATORY_CARE_PROVIDER_SITE_OTHER)
Admission: RE | Admit: 2014-04-28 | Discharge: 2014-04-28 | Disposition: A | Discharge status: Home / Self Care | Payer: Managed Care, Other (non HMO) | Source: Ambulatory Visit | Attending: Internal Medicine | Admitting: Internal Medicine

## 2014-04-28 VITALS — BP 120/80 | HR 94 | Temp 99.5°F | Resp 16 | Ht 66.0 in | Wt 225.0 lb

## 2014-04-28 DIAGNOSIS — R7611 Nonspecific reaction to tuberculin skin test without active tuberculosis: Secondary | ICD-10-CM

## 2014-04-28 DIAGNOSIS — J189 Pneumonia, unspecified organism: Secondary | ICD-10-CM

## 2014-04-28 DIAGNOSIS — R05 Cough: Secondary | ICD-10-CM

## 2014-04-28 DIAGNOSIS — R059 Cough, unspecified: Secondary | ICD-10-CM

## 2014-04-28 MED ORDER — AZITHROMYCIN 500 MG PO TABS: 500.0000 mg | ORAL_TABLET | Freq: Every day | ORAL | Status: DC

## 2014-04-28 MED ORDER — PROMETHAZINE-DM 6.25-15 MG/5ML PO SYRP: 5.0000 mL | ORAL_SOLUTION | Freq: Four times a day (QID) | ORAL | Status: DC | PRN

## 2014-04-28 NOTE — Patient Instructions (Signed)
Cough, Adult  A cough is a reflex that helps clear your throat and airways. It can help heal the body or may be a reaction to an irritated airway. A cough may only last 2 or 3 weeks (acute) or may last more than 8 weeks (chronic).  CAUSES Acute cough:  Viral or bacterial infections. Chronic cough:  Infections.  Allergies.  Asthma.  Post-nasal drip.  Smoking.  Heartburn or acid reflux.  Some medicines.  Chronic lung problems (COPD).  Cancer. SYMPTOMS   Cough.  Fever.  Chest pain.  Increased breathing rate.  High-pitched whistling sound when breathing (wheezing).  Colored mucus that you cough up (sputum). TREATMENT   A bacterial cough may be treated with antibiotic medicine.  A viral cough must run its course and will not respond to antibiotics.  Your caregiver may recommend other treatments if you have a chronic cough. HOME CARE INSTRUCTIONS   Only take over-the-counter or prescription medicines for pain, discomfort, or fever as directed by your caregiver. Use cough suppressants only as directed by your caregiver.  Use a cold steam vaporizer or humidifier in your bedroom or home to help loosen secretions.  Sleep in a semi-upright position if your cough is worse at night.  Rest as needed.  Stop smoking if you smoke. SEEK IMMEDIATE MEDICAL CARE IF:   You have pus in your sputum.  Your cough starts to worsen.  You cannot control your cough with suppressants and are losing sleep.  You begin coughing up blood.  You have difficulty breathing.  You develop pain which is getting worse or is uncontrolled with medicine.  You have a fever. MAKE SURE YOU:   Understand these instructions.  Will watch your condition.  Will get help right away if you are not doing well or get worse. Document Released: 11/12/2010 Document Revised: 08/08/2011 Document Reviewed: 11/12/2010 [employer] Patient Information 2015 [employer]. This information is not intended  to replace advice given to you by your health care provider. Make sure you discuss any questions you have with your health care provider.  

## 2014-04-28 NOTE — Progress Notes (Signed)
Pre visit review using our clinic review tool, if applicable. No additional management support is needed unless otherwise documented below in the visit note. 

## 2014-04-29 NOTE — Progress Notes (Signed)
Subjective:    Patient ID: Megan Wilkerson, female    DOB: 03-May-1966, 48 y.o.   MRN: [id number]  Cough The current episode started in the past 7 days. The problem has been unchanged. The problem occurs every few hours. The cough is productive of purulent sputum. Associated symptoms include chills, a fever and shortness of breath. Pertinent negatives include no chest pain, ear congestion, ear pain, headaches, heartburn, hemoptysis, myalgias, nasal congestion, postnasal drip, rash, rhinorrhea, sore throat, sweats, weight loss or wheezing. She has tried OTC cough suppressant for the symptoms. The treatment provided mild relief.      Review of Systems  Constitutional: Positive for fever and chills. Negative for weight loss, diaphoresis, activity change, appetite change, fatigue and unexpected weight change.  HENT: Positive for voice change. Negative for ear pain, postnasal drip, rhinorrhea, sore throat and trouble swallowing.   Eyes: Negative.   Respiratory: Positive for cough and shortness of breath. Negative for hemoptysis and wheezing.   Cardiovascular: Negative.  Negative for chest pain, palpitations and leg swelling.  Gastrointestinal: Negative.  Negative for heartburn and abdominal pain.  Endocrine: Negative.   Genitourinary: Negative.   Musculoskeletal: Negative.  Negative for myalgias, back pain and arthralgias.  Skin: Negative.  Negative for rash.  Allergic/Immunologic: Negative.   Neurological: Negative.  Negative for headaches.  Hematological: Negative.  Negative for adenopathy. Does not bruise/bleed easily.  Psychiatric/Behavioral: Negative.        Objective:   Physical Exam  Constitutional: She is oriented to person, place, and time. She appears well-developed and well-nourished.  Non-toxic appearance. She does not have a sickly appearance. She does not appear ill. No distress.  HENT:  Head: Normocephalic and atraumatic.  Mouth/Throat: Oropharynx is clear and moist. No  oropharyngeal exudate.  Eyes: Conjunctivae are normal. Right eye exhibits no discharge. Left eye exhibits no discharge. No scleral icterus.  Neck: Normal range of motion. Neck supple. No JVD present. No tracheal deviation present. No thyromegaly present.  Cardiovascular: Normal rate, regular rhythm, normal heart sounds and intact distal pulses.  Exam reveals no gallop and no friction rub.   No murmur heard. Pulmonary/Chest: Effort normal and breath sounds normal. No stridor. No respiratory distress. She has no wheezes. She has no rales. She exhibits no tenderness.  Abdominal: Soft. Bowel sounds are normal. She exhibits no distension and no mass. There is no tenderness. There is no rebound and no guarding.  Musculoskeletal: Normal range of motion. She exhibits no edema or tenderness.  Lymphadenopathy:    She has no cervical adenopathy.  Neurological: She is oriented to person, place, and time.  Skin: Skin is warm and dry. No rash noted. She is not diaphoretic. No erythema. No pallor.  Psychiatric: She has a normal mood and affect. Her behavior is normal. Judgment and thought content normal.  Vitals reviewed.   Lab Results  Component Value Date   WBC 6.1 08/03/2013   HGB 13.9 08/03/2013   HCT 41.0 08/03/2013   PLT 236 08/03/2013   GLUCOSE 120* 08/03/2013   CHOL 185 09/10/2009   TRIG 101.0 09/10/2009   HDL 36.40* 09/10/2009   LDLCALC 128* 09/10/2009   ALT 16 08/29/2012   AST 14 08/29/2012   NA 143 08/03/2013   K 3.5* 08/03/2013   CL 103 08/03/2013   CREATININE 1.10 08/03/2013   BUN 11 08/03/2013   CO2 28 08/29/2012   TSH 0.79 08/29/2012   HGBA1C 6.0 08/21/2008        Assessment & Plan:

## 2014-04-29 NOTE — Assessment & Plan Note (Signed)
I will check her CXR to look for PNA, mass, edema

## 2014-04-29 NOTE — Assessment & Plan Note (Signed)
I will treat the infection with zithromax and will control the cough with phenergan-dm 

## 2014-04-29 NOTE — Assessment & Plan Note (Signed)
I will recheck he CXR today to see if there is any concern for active infection

## 2014-05-19 ENCOUNTER — Ambulatory Visit: Payer: Managed Care, Other (non HMO) | Admitting: Internal Medicine

## 2014-05-28 ENCOUNTER — Encounter: Payer: Managed Care, Other (non HMO) | Admitting: Internal Medicine

## 2014-06-06 ENCOUNTER — Ambulatory Visit (INDEPENDENT_AMBULATORY_CARE_PROVIDER_SITE_OTHER): Payer: Managed Care, Other (non HMO) | Admitting: Internal Medicine

## 2014-06-06 ENCOUNTER — Encounter: Payer: Self-pay | Admitting: Internal Medicine

## 2014-06-06 VITALS — BP 110/82 | HR 85 | Temp 98.5°F | Ht 66.0 in | Wt 229.2 lb

## 2014-06-06 DIAGNOSIS — J4521 Mild intermittent asthma with (acute) exacerbation: Secondary | ICD-10-CM

## 2014-06-06 DIAGNOSIS — J209 Acute bronchitis, unspecified: Secondary | ICD-10-CM

## 2014-06-06 DIAGNOSIS — J309 Allergic rhinitis, unspecified: Secondary | ICD-10-CM

## 2014-06-06 MED ORDER — AZITHROMYCIN 250 MG PO TABS: ORAL_TABLET | ORAL | Status: DC

## 2014-06-06 MED ORDER — HYDROCODONE-HOMATROPINE 5-1.5 MG/5ML PO SYRP: 5.0000 mL | ORAL_SOLUTION | Freq: Four times a day (QID) | ORAL | Status: DC | PRN

## 2014-06-06 NOTE — Patient Instructions (Signed)
Please take all new medication as prescribed  Please continue all other medications as before, and refills have been done if requested.  Please have the pharmacy call with any other refills you may need.  Please keep your appointments with your specialists as you may have planned     

## 2014-06-06 NOTE — Progress Notes (Signed)
Subjective:    Patient ID: Megan Wilkerson, female    DOB: [birth year], 49 y.o.   MRN: [id number]  HPI Here with acute onset mild to mod 2-3 days ST, HA, general weakness and malaise, with prod cough greenish sputum, but Pt denies chest pain, increased sob or doe, wheezing, orthopnea, PND, increased LE swelling, palpitations, dizziness or syncope.  Does have several wks ongoing nasal allergy symptoms with mild clearish congestion, itch and sneezing, without fever, pain, ST, cough, swelling or wheezing. Past Medical History  Diagnosis Date  . GERD   . HYPERLIPIDEMIA, WITH LOW HDL   . POSITIVE PPD 1987    s/p 40mo tx  . MIGRAINE HEADACHE   . PERIPHERAL NEUROPATHY    Past Surgical History  Procedure Laterality Date  . Appendectomy  1992  . Abdominal hysterectomy  1996  . Cesarean section  1989  . Cholecystectomy  04/22/2011    Procedure: LAPAROSCOPIC CHOLECYSTECTOMY WITH INTRAOPERATIVE CHOLANGIOGRAM;  Surgeon: Almond Lint, MD;  Location: [facility] OR;  Service: General;  Laterality: N/A;    reports that she has never smoked. She has never used smokeless tobacco. She reports that she does not drink alcohol or use illicit drugs. family history includes Alcohol abuse in her other; Arthritis in her other; Diabetes in her brother and mother; Heart disease in her father; Hypertension in her father and other; Prostate cancer in her father. Allergies  Allergen Reactions  . Other Anaphylaxis    Archie Balboa rancher gummies and paprika   . Peanuts [Peanut Oil] Anaphylaxis and Itching  . Corn Dextrin Other (See Comments)    Plain corn ok, popcorn causes hives  . Gluten Meal Other (See Comments)    cough  . Shellfish Allergy Itching  . Soy Allergy Other (See Comments)    coughing  . Sunflower Oil Itching  . Tomato Itching  . Wheat Bran     swelling  . Morphine Itching and Rash   Current Outpatient Prescriptions on File Prior to Visit  Medication Sig Dispense Refill  . Albuterol Sulfate (PROAIR  RESPICLICK) 108 (90 BASE) MCG/ACT AEPB Inhale 1-2 puffs into the lungs every 6 (six) hours. 1 each 11  . brimonidine (ALPHAGAN) 0.15 % ophthalmic solution Place 1 drop into both eyes 2 (two) times daily.    . budesonide-formoterol (SYMBICORT) 80-4.5 MCG/ACT inhaler Inhale 2 puffs into the lungs 2 (two) times daily. 1 Inhaler 11  . diphenhydrAMINE (BENADRYL) 25 MG tablet Take 25 mg by mouth every 6 (six) hours as needed for allergies.    Marland Kitchen EPINEPHrine (EPI-PEN) 0.3 mg/0.3 mL DEVI Inject 0.3 mg into the muscle once as needed (for severe allergic reaction). CAll 911 immediately if you have to use this medicine    . fexofenadine (ALLEGRA) 180 MG tablet Take 180 mg by mouth daily.    . promethazine-dextromethorphan (PROMETHAZINE-DM) 6.25-15 MG/5ML syrup Take 5 mLs by mouth 4 (four) times daily as needed for cough. 118 mL 0  . pyridOXINE (VITAMIN B-6) 100 MG tablet Take 100 mg by mouth daily.      No current facility-administered medications on file prior to visit.   Review of Systems  Constitutional: Negative for unusual diaphoresis or other sweats  HENT: Negative for ringing in ear Eyes: Negative for double vision or worsening visual disturbance.  Respiratory: Negative for choking and stridor.   Gastrointestinal: Negative for vomiting or other signifcant bowel change Genitourinary: Negative for hematuria or decreased urine volume.  Musculoskeletal: Negative for other MSK pain or swelling Skin: Negative  for color change and worsening wound.  Neurological: Negative for tremors and numbness other than noted  Psychiatric/Behavioral: Negative for decreased concentration or agitation other than above       Objective:   Physical Exam BP 110/82 mmHg  Pulse 85  Temp(Src) 98.5 F (36.9 C)  Ht 5\' 6"  (1.676 m)  Wt 229 lb 4 oz (103.987 kg)  BMI 37.02 kg/m2  SpO2 98% VS noted, mild ill Constitutional: Pt appears well-developed, well-nourished.  HENT: Head: NCAT.  Right Ear: External ear normal.    Left Ear: External ear normal.  Eyes: . Pupils are equal, round, and reactive to light. Conjunctivae and EOM are normal Bilat tm's with mild erythema.  Max sinus areas non tender.  Pharynx with mild erythema, no exudate Neck: Normal range of motion. Neck supple.  Cardiovascular: Normal rate and regular rhythm.   Pulmonary/Chest: Effort normal and breath sounds without rales or wheezing.  Neurological: Pt is alert. Not confused , motor grossly intact Skin: Skin is warm. No rash Psychiatric: Pt behavior is normal. No agitation.     Assessment & Plan:

## 2014-06-06 NOTE — Assessment & Plan Note (Signed)
Resolved/controlled, no evidence for wheezing today, ok to hold steroid or other change in tx at this time

## 2014-06-06 NOTE — Progress Notes (Signed)
Pre visit review using our clinic review tool, if applicable. No additional management support is needed unless otherwise documented below in the visit note. 

## 2014-06-06 NOTE — Assessment & Plan Note (Signed)
Mild to mod, for antibx course,  to f/u any worsening symptoms or concerns 

## 2014-06-06 NOTE — Assessment & Plan Note (Signed)
For re-start allegra asd,  to f/u any worsening symptoms or concerns

## 2014-07-11 ENCOUNTER — Emergency Department (HOSPITAL_COMMUNITY)
Admission: EM | Admit: 2014-07-11 | Discharge: 2014-07-11 | Disposition: A | Discharge status: Home / Self Care | Payer: Managed Care, Other (non HMO) | Attending: Emergency Medicine | Admitting: Emergency Medicine

## 2014-07-11 ENCOUNTER — Encounter (HOSPITAL_COMMUNITY): Payer: Self-pay | Admitting: *Deleted

## 2014-07-11 DIAGNOSIS — Z7951 Long term (current) use of inhaled steroids: Secondary | ICD-10-CM | POA: Insufficient documentation

## 2014-07-11 DIAGNOSIS — Z8639 Personal history of other endocrine, nutritional and metabolic disease: Secondary | ICD-10-CM | POA: Diagnosis not present

## 2014-07-11 DIAGNOSIS — Z8669 Personal history of other diseases of the nervous system and sense organs: Secondary | ICD-10-CM | POA: Diagnosis not present

## 2014-07-11 DIAGNOSIS — Z79899 Other long term (current) drug therapy: Secondary | ICD-10-CM | POA: Diagnosis not present

## 2014-07-11 DIAGNOSIS — Z8679 Personal history of other diseases of the circulatory system: Secondary | ICD-10-CM | POA: Insufficient documentation

## 2014-07-11 DIAGNOSIS — Z8719 Personal history of other diseases of the digestive system: Secondary | ICD-10-CM | POA: Insufficient documentation

## 2014-07-11 DIAGNOSIS — Y9389 Activity, other specified: Secondary | ICD-10-CM | POA: Insufficient documentation

## 2014-07-11 DIAGNOSIS — Z760 Encounter for issue of repeat prescription: Secondary | ICD-10-CM | POA: Insufficient documentation

## 2014-07-11 DIAGNOSIS — T7809XA Anaphylactic reaction due to other food products, initial encounter: Secondary | ICD-10-CM | POA: Diagnosis not present

## 2014-07-11 DIAGNOSIS — R0989 Other specified symptoms and signs involving the circulatory and respiratory systems: Secondary | ICD-10-CM | POA: Diagnosis present

## 2014-07-11 DIAGNOSIS — Y998 Other external cause status: Secondary | ICD-10-CM | POA: Insufficient documentation

## 2014-07-11 DIAGNOSIS — Y9259 Other trade areas as the place of occurrence of the external cause: Secondary | ICD-10-CM | POA: Diagnosis not present

## 2014-07-11 DIAGNOSIS — T782XXA Anaphylactic shock, unspecified, initial encounter: Secondary | ICD-10-CM

## 2014-07-11 MED ORDER — PREDNISONE 20 MG PO TABS: 60.0000 mg | ORAL_TABLET | Freq: Every day | ORAL | Status: DC

## 2014-07-11 MED ORDER — DIPHENHYDRAMINE HCL 50 MG/ML IJ SOLN
50.0000 mg | Freq: Once | INTRAMUSCULAR | Status: AC
  Administered 2014-07-11: 50 mg via INTRAVENOUS
  Filled 2014-07-11: qty 1

## 2014-07-11 MED ORDER — EPINEPHRINE 0.3 MG/0.3ML IJ SOAJ: 0.3000 mg | Freq: Once | INTRAMUSCULAR | Status: DC

## 2014-07-11 MED ORDER — DIPHENHYDRAMINE HCL 25 MG PO TABS: 50.0000 mg | ORAL_TABLET | ORAL | Status: DC | PRN

## 2014-07-11 MED ORDER — METHYLPREDNISOLONE SODIUM SUCC 125 MG IJ SOLR
125.0000 mg | Freq: Once | INTRAMUSCULAR | Status: AC
  Administered 2014-07-11: 125 mg via INTRAVENOUS
  Filled 2014-07-11: qty 2

## 2014-07-11 NOTE — ED Provider Notes (Signed)
CSN: [id number]     Arrival date & time 07/11/14  1519 History   First MD Initiated Contact with Patient 07/11/14 1546     Chief Complaint  Patient presents with  . Allergic Reaction     (Consider location/radiation/quality/duration/timing/severity/associated sxs/prior Treatment) HPI  49 year old female presents after having allergic reaction after eating [employer]. 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 She states she had a typical burger with fries and a sweet tea around 12:30 and shortly afterwards started feeling itching in her throat and nose that her lips were swollen and itchy. She felt like she was choking and so she took Benadryl and started to drive to go pick up her daughter. All along the way she started herself wheeze and took an albuterol inhaler and almost passed out. Her husband arrived around 2:30 pm and gave her her EpiPen. Since then her symptoms have improved but she still feels like she has a scratchy and itchy throat and that her voice is changed. The patient called EMS and she was given 50 mg Zantac on the way to the hospital. Patient feels significantly improved but does feel perioral itching though the swelling is gone. Shortness of breath, cough, and wheezing are all gone.  Past Medical History  Diagnosis Date  . GERD   . HYPERLIPIDEMIA, WITH LOW HDL   . POSITIVE PPD 1987    s/p 28mo tx  . MIGRAINE HEADACHE   . PERIPHERAL NEUROPATHY    Past Surgical History  Procedure Laterality Date  . Appendectomy  1992  . Abdominal hysterectomy  1996  . Cesarean section  1989  . Cholecystectomy  04/22/2011    Procedure: LAPAROSCOPIC CHOLECYSTECTOMY WITH INTRAOPERATIVE CHOLANGIOGRAM;  Surgeon: Almond LintFaera Byerly, MD;  Location: [facility] OR;  Service: General;  Laterality: N/A;   Family History  Problem Relation Age of Onset  . Diabetes Mother   . Prostate cancer Father   . Hypertension Father   . Heart disease Father   . Diabetes Brother   . Alcohol abuse  Other   . Arthritis Other   . Hypertension Other    History  Substance Use Topics  . Smoking status: Never Smoker   . Smokeless tobacco: Never Used  . Alcohol Use: No   OB History    Gravida Para Term Preterm AB TAB SAB Ectopic Multiple Living   5 5        5      Review of Systems  HENT: Positive for facial swelling, sore throat and voice change.   Respiratory: Positive for cough, shortness of breath and wheezing.   Neurological: Positive for light-headedness. Negative for syncope.  All other systems reviewed and are negative.     Allergies  Other; Peanuts; Corn dextrin; Gluten meal; Shellfish allergy; Soy allergy; Sunflower oil; Tomato; Wheat bran; and Morphine  Home Medications   Prior to Admission medications   Medication Sig Start Date End Date Taking? Authorizing Provider  Albuterol Sulfate (PROAIR RESPICLICK) 108 (90 BASE) MCG/ACT AEPB Inhale 1-2 puffs into the lungs every 6 (six) hours. 04/10/14  Yes Etta Grandchildhomas L Jones, MD  brimonidine (ALPHAGAN) 0.15 % ophthalmic solution Place 1 drop into both eyes 2 (two) times daily.   Yes Historical Provider, MD  budesonide-formoterol (SYMBICORT) 80-4.5 MCG/ACT inhaler Inhale 2 puffs into the lungs 2 (two) times daily. 04/10/14  Yes Etta Grandchildhomas L Jones, MD  diphenhydrAMINE (BENADRYL) 25 MG tablet Take 50 mg by mouth every 6 (six) hours as needed for itching or allergies (itching).  Yes Historical Provider, MD  EPINEPHrine (EPI-PEN) 0.3 mg/0.3 mL DEVI Inject 0.3 mg into the muscle once as needed (for severe allergic reaction). CAll 911 immediately if you have to use this medicine 08/15/12  Yes Hannah Muthersbaugh, PA-C  fexofenadine (ALLEGRA) 180 MG tablet Take 180 mg by mouth daily.   Yes Historical Provider, MD  pyridOXINE (VITAMIN B-6) 100 MG tablet Take 100 mg by mouth daily.    Yes Historical Provider, MD  azithromycin (ZITHROMAX Z-PAK) 250 MG tablet Use as directed Patient not taking: Reported on 07/11/2014 06/06/14   Corwin Levins, MD   HYDROcodone-homatropine [facility]) 5-1.5 MG/5ML syrup Take 5 mLs by mouth every 6 (six) hours as needed for cough. Patient not taking: Reported on 07/11/2014 06/06/14   Corwin Levins, MD  promethazine-dextromethorphan (PROMETHAZINE-DM) 6.25-15 MG/5ML syrup Take 5 mLs by mouth 4 (four) times daily as needed for cough. Patient not taking: Reported on 07/11/2014 04/28/14   Etta Grandchild, MD   Temp(Src) 98.7 F (37.1 C) (Oral) Physical Exam  Constitutional: She is oriented to person, place, and time. She appears well-developed and well-nourished.  HENT:  Head: Normocephalic and atraumatic.  Right Ear: External ear normal.  Left Ear: External ear normal.  Nose: Nose normal.  Mouth/Throat: Oropharynx is clear and moist.  Lips, tongue, floor of mouth, and posterior oropharynx are all normal appearing without edema  Eyes: Right eye exhibits no discharge. Left eye exhibits no discharge.  Cardiovascular: Normal rate, regular rhythm and normal heart sounds.   Pulmonary/Chest: Effort normal and breath sounds normal. No stridor. She has no wheezes.  Abdominal: Soft. She exhibits no distension. There is no tenderness.  Neurological: She is alert and oriented to person, place, and time.  Skin: Skin is warm and dry. No rash noted.  Nursing note and vitals reviewed.   ED Course  Procedures (including critical care time) Labs Review Labs Reviewed - No data to display  Imaging Review No results found.   EKG Interpretation   Date/Time:  Friday July 11 2014 15:19:51 EST Ventricular Rate:  85 PR Interval:  167 QRS Duration: 86 QT Interval:  371 QTC Calculation: 441 R Axis:   43 Text Interpretation:  Sinus rhythm Abnormal R-wave progression, early  transition Borderline T abnormalities, anterior leads no significant  change since Sept 2015 Confirmed by Criss Alvine  MD, Janaiah Vetrano ([id number]) on  07/11/2014 3:54:55 PM      MDM   Final diagnoses:  Anaphylaxis, initial encounter    Patient's  symptoms seem to be gradually improving. She does have continued mild hoarseness but she is breathing normally, has had no shortness of breath, wheezing, or respiratory symptoms including no oropharyngeal swelling over the 3-4 hour she was watched in the ER. She will be given a refill of her EpiPen, per prescription for Benadryl, and steroid burst. She is at this time stable for discharge, discussed strict return precautions.    Audree Camel, MD 07/11/14 [phone]

## 2014-07-11 NOTE — Discharge Instructions (Signed)
Anaphylactic Reaction An anaphylactic reaction is a sudden, severe allergic reaction that involves the whole body. It can be life threatening. A hospital stay is often required. People with asthma, eczema, or hay fever are slightly more likely to have an anaphylactic reaction. CAUSES  An anaphylactic reaction may be caused by anything to which you are allergic. After being exposed to the allergic substance, your immune system becomes sensitized to it. When you are exposed to that allergic substance again, an allergic reaction can occur. Common causes of an anaphylactic reaction include:  Medicines.  Foods, especially peanuts, wheat, shellfish, milk, and eggs.  Insect bites or stings.  Blood products.  Chemicals, such as dyes, latex, and contrast material used for imaging tests. SYMPTOMS  When an allergic reaction occurs, the body releases histamine and other substances. These substances cause symptoms such as tightening of the airway. Symptoms often develop within seconds or minutes of exposure. Symptoms may include:  Skin rash or hives.  Itching.  Chest tightness.  Swelling of the eyes, tongue, or lips.  Trouble breathing or swallowing.  Lightheadedness or fainting.  Anxiety or confusion.  Stomach pains, vomiting, or diarrhea.  Nasal congestion.  A fast or irregular heartbeat (palpitations). DIAGNOSIS  Diagnosis is based on your history of recent exposure to allergic substances, your symptoms, and a physical exam. Your caregiver may also perform blood or urine tests to confirm the diagnosis. TREATMENT  Epinephrine medicine is the main treatment for an anaphylactic reaction. Other medicines that may be used for treatment include antihistamines, steroids, and albuterol. In severe cases, fluids and medicine to support blood pressure may be given through an intravenous line (IV). Even if you improve after treatment, you need to be observed to make sure your condition does not get  worse. This may require a stay in the hospital. [place] a medical alert bracelet or necklace stating your allergy.  You and your family must learn how to use an anaphylaxis kit or give an epinephrine injection to temporarily treat an emergency allergic reaction. Always carry your epinephrine injection or anaphylaxis kit with you. This can be lifesaving if you have a severe reaction.  Do not drive or perform tasks after treatment until the medicines used to treat your reaction have worn off, or until your caregiver says it is okay.  If you have hives or a rash:  Take medicines as directed by your caregiver.  You may use an over-the-counter antihistamine (diphenhydramine) as needed.  Apply cold compresses to the skin or take baths in cool water. Avoid hot baths or showers. SEEK MEDICAL CARE IF:   You develop symptoms of an allergic reaction to a new substance. Symptoms may start right away or minutes later.  You develop a rash, hives, or itching.  You develop new symptoms. SEEK IMMEDIATE MEDICAL CARE IF:   You have swelling of the mouth, difficulty breathing, or wheezing.  You have a tight feeling in your chest or throat.  You develop hives, swelling, or itching all over your body.  You develop severe vomiting or diarrhea.  You feel faint or pass out. This is an emergency. Use your epinephrine injection or anaphylaxis kit as you have been instructed. Call your local emergency services (911 in [place] Even if you improve after the injection, you need to be examined at a hospital emergency department. MAKE SURE YOU:   Understand these instructions.  Will watch your condition.  Will get help right away if you are not  doing well or get worse. Document Released: 05/16/2005 Document Revised: 05/21/2013 Document Reviewed: 08/17/2011 [facility] Patient Information 2015 [place], [place]. This information is not intended to replace advice given to you by your health  care provider. Make sure you discuss any questions you have with your health care provider.     Allergies Allergies may happen from anything your body is sensitive to. This may be food, medicines, pollens, chemicals, and nearly anything around you in everyday life that produces allergens. An allergen is anything that causes an allergy producing substance. Heredity is often a factor in causing these problems. This means you may have some of the same allergies as your parents. Food allergies happen in all age groups. Food allergies are some of the most severe and life threatening. Some common food allergies are cow's milk, seafood, eggs, nuts, wheat, and soybeans. SYMPTOMS   Swelling around the mouth.  An itchy red rash or hives.  Vomiting or diarrhea.  Difficulty breathing. SEVERE ALLERGIC REACTIONS ARE LIFE-THREATENING. This reaction is called anaphylaxis. It can cause the mouth and throat to swell and cause difficulty with breathing and swallowing. In severe reactions only a trace amount of food (for example, peanut oil in a salad) may cause death within seconds. Seasonal allergies occur in all age groups. These are seasonal because they usually occur during the same season every year. They may be a reaction to molds, grass pollens, or tree pollens. Other causes of problems are house dust mite allergens, pet dander, and mold spores. The symptoms often consist of nasal congestion, a runny itchy nose associated with sneezing, and tearing itchy eyes. There is often an associated itching of the mouth and ears. The problems happen when you come in contact with pollens and other allergens. Allergens are the particles in the air that the body reacts to with an allergic reaction. This causes you to release allergic antibodies. Through a chain of events, these eventually cause you to release histamine into the blood stream. Although it is meant to be protective to the body, it is this release that causes  your discomfort. This is why you were given anti-histamines to feel better. If you are unable to pinpoint the offending allergen, it may be determined by skin or blood testing. Allergies cannot be cured but can be controlled with medicine. Hay fever is a collection of all or some of the seasonal allergy problems. It may often be treated with simple over-the-counter medicine such as diphenhydramine. Take medicine as directed. Do not drink alcohol or drive while taking this medicine. Check with your caregiver or package insert for child dosages. If these medicines are not effective, there are many new medicines your caregiver can prescribe. Stronger medicine such as nasal spray, eye drops, and corticosteroids may be used if the first things you try do not work well. Other treatments such as immunotherapy or desensitizing injections can be used if all else fails. Follow up with your caregiver if problems continue. These seasonal allergies are usually not life threatening. They are generally more of a nuisance that can often be handled using medicine. HOME CARE INSTRUCTIONS   If unsure what causes a reaction, keep a diary of foods eaten and symptoms that follow. Avoid foods that cause reactions.  If hives or rash are present:  Take medicine as directed.  You may use an over-the-counter antihistamine (diphenhydramine) for hives and itching as needed.  Apply cold compresses (cloths) to the skin or take baths in cool water. Avoid hot baths  or showers. Heat will make a rash and itching worse.  If you are severely allergic:  Following a treatment for a severe reaction, hospitalization is often required for closer follow-up.  Wear a medic-alert bracelet or necklace stating the allergy.  You and your family must learn how to give adrenaline or use an anaphylaxis kit.  If you have had a severe reaction, always carry your anaphylaxis kit or EpiPen with you. Use this medicine as directed by your caregiver  if a severe reaction is occurring. Failure to do so could have a fatal outcome. SEEK MEDICAL CARE IF:  You suspect a food allergy. Symptoms generally happen within 30 minutes of eating a food.  Your symptoms have not gone away within 2 days or are getting worse.  You develop new symptoms.  You want to retest yourself or your child with a food or drink you think causes an allergic reaction. Never do this if an anaphylactic reaction to that food or drink has happened before. Only do this under the care of a caregiver. SEEK IMMEDIATE MEDICAL CARE IF:   You have difficulty breathing, are wheezing, or have a tight feeling in your chest or throat.  You have a swollen mouth, or you have hives, swelling, or itching all over your body.  You have had a severe reaction that has responded to your anaphylaxis kit or an EpiPen. These reactions may return when the medicine has worn off. These reactions should be considered life threatening. MAKE SURE YOU:   Understand these instructions.  Will watch your condition.  Will get help right away if you are not doing well or get worse. Document Released: 08/09/2002 Document Revised: 09/10/2012 Document Reviewed: 01/14/2008 [facility] Patient Information 2015 [place], [place]. This information is not intended to replace advice given to you by your health care provider. Make sure you discuss any questions you have with your health care provider.

## 2014-07-11 NOTE — ED Notes (Addendum)
Per EMS, pt complains of an allergic reaction after eating [employer] today. Pt states she ate a burger, fries, and sweet tea at 1230 and began having itching in her throat shortly therafter. Pt states the symptoms worsened, developing cough and  shortness of breath. Pt has a history of multiple food allergies. Pt gave took benadryl 50mg  at 130PM, gave herself 0.3mg  epi IM AT 220PM. Pt given 50mg  zantac en route to hospital. Pt now complains of itching in throat and indigestion.

## 2014-07-11 NOTE — ED Notes (Signed)
Bed: WA02 Expected date:  Expected time:  Means of arrival:  Comments: EMS - Allergic Reaction

## 2014-07-16 ENCOUNTER — Encounter: Payer: Self-pay | Admitting: Family

## 2014-07-16 ENCOUNTER — Other Ambulatory Visit (INDEPENDENT_AMBULATORY_CARE_PROVIDER_SITE_OTHER): Payer: Managed Care, Other (non HMO)

## 2014-07-16 ENCOUNTER — Ambulatory Visit (INDEPENDENT_AMBULATORY_CARE_PROVIDER_SITE_OTHER): Payer: Managed Care, Other (non HMO) | Admitting: Family

## 2014-07-16 VITALS — BP 118/84 | HR 90 | Temp 98.7°F | Resp 18 | Ht 66.0 in | Wt 229.0 lb

## 2014-07-16 DIAGNOSIS — T7840XD Allergy, unspecified, subsequent encounter: Secondary | ICD-10-CM

## 2014-07-16 LAB — CBC WITH DIFFERENTIAL/PLATELET
Basophils Absolute: 0.2 10*3/uL — ABNORMAL HIGH (ref 0.0–0.1)
Basophils Relative: 1.6 % (ref 0.0–3.0)
EOS ABS: 0 10*3/uL (ref 0.0–0.7)
EOS PCT: 0.4 % (ref 0.0–5.0)
HCT: 43.9 % (ref 36.0–46.0)
HEMOGLOBIN: 15.1 g/dL — AB (ref 12.0–15.0)
Lymphocytes Relative: 31.9 % (ref 12.0–46.0)
Lymphs Abs: 3.4 10*3/uL (ref 0.7–4.0)
MCHC: 34.5 g/dL (ref 30.0–36.0)
MCV: 88.3 fl (ref 78.0–100.0)
MONOS PCT: 6.8 % (ref 3.0–12.0)
Monocytes Absolute: 0.7 10*3/uL (ref 0.1–1.0)
Neutro Abs: 6.3 10*3/uL (ref 1.4–7.7)
Neutrophils Relative %: 59.3 % (ref 43.0–77.0)
PLATELETS: 256 10*3/uL (ref 150.0–400.0)
RBC: 4.98 Mil/uL (ref 3.87–5.11)
RDW: 13 % (ref 11.5–15.5)
WBC: 10.6 10*3/uL — ABNORMAL HIGH (ref 4.0–10.5)

## 2014-07-16 LAB — BASIC METABOLIC PANEL
BUN: 15 mg/dL (ref 6–23)
CALCIUM: 9.8 mg/dL (ref 8.4–10.5)
CO2: 31 mEq/L (ref 19–32)
CREATININE: 0.95 mg/dL (ref 0.40–1.20)
Chloride: 104 mEq/L (ref 96–112)
GFR: 80.86 mL/min (ref >60.00)
Glucose, Bld: 95 mg/dL (ref 70–99)
Potassium: 3.6 mEq/L (ref 3.5–5.1)
Sodium: 142 mEq/L (ref 135–145)

## 2014-07-16 NOTE — Assessment & Plan Note (Signed)
Follow-up visit for previous allergic reaction. Patient has completed her steroid dosage. Continues to experience mild symptoms which are relieved by Benadryl. Continue current dosage of Benadryl at this time. If symptoms worsen or fail to improve consider additional steroid taper or injection. Ensure adequate nutritional intake and obtain basic metabolic panel and CBC with differential.

## 2014-07-16 NOTE — Progress Notes (Signed)
Subjective:    Patient ID: Megan Wilkerson, female    DOB: 22-Oct-1965, 49 y.o.   MRN: [id number]  Chief Complaint  Patient presents with  . Fatigue    Had a reaction to what she thinks was a [employer] hamburger, ever since her reaction she has been weak, tired, and has had no energy, her reaction happend on friday of last week    HPI:  Megan Wilkerson is a 49 y.o. female who presents today for follow up.  Was recently seen in the ED for an anaphylactic reaction to what she thinks was a hamburger. She began to experience itching and her nose and throat or swelling. She administered an EpiPen and was seen in the emergency room. Upon discharge from the emergency room she was given a prescription for Benadryl, prednisone, and a refill of her EpiPen. All emergency room records were reviewed in detail.  She finished the prednisone today. Indicates she continues to experience the associated symptoms hives, scratchy throat, dizzy, lack of energy and weak. Continues to take her Benedryl which provides her some relief. Hives are located on her chest and face, but resolve mainly with the benedryl. Denies any symptoms of angioedema or anaphylaxis. Eating a little bit right now.  Allergies  Allergen Reactions  . Other Anaphylaxis    Archie Balboa rancher gummies and paprika   . Peanuts [Peanut Oil] Anaphylaxis and Itching  . Corn Dextrin Other (See Comments)    Plain corn ok, popcorn causes hives  . Gluten Meal Other (See Comments)    cough  . Shellfish Allergy Itching  . Soy Allergy Other (See Comments)    coughing  . Sunflower Oil Itching  . Tomato Itching  . Wheat Bran     swelling  . Morphine Itching and Rash    Current Outpatient Prescriptions on File Prior to Visit  Medication Sig Dispense Refill  . Albuterol Sulfate (PROAIR RESPICLICK) 108 (90 BASE) MCG/ACT AEPB Inhale 1-2 puffs into the lungs every 6 (six) hours. 1 each 11  . azithromycin (ZITHROMAX Z-PAK) 250 MG tablet Use as directed  6 tablet 1  . brimonidine (ALPHAGAN) 0.15 % ophthalmic solution Place 1 drop into both eyes 2 (two) times daily.    . budesonide-formoterol (SYMBICORT) 80-4.5 MCG/ACT inhaler Inhale 2 puffs into the lungs 2 (two) times daily. 1 Inhaler 11  . diphenhydrAMINE (BENADRYL) 25 MG tablet Take 2 tablets (50 mg total) by mouth every 4 (four) hours as needed for itching or allergies. 30 tablet 0  . EPINEPHrine 0.3 mg/0.3 mL IJ SOAJ injection Inject 0.3 mLs (0.3 mg total) into the muscle once. 1 Device 1  . fexofenadine (ALLEGRA) 180 MG tablet Take 180 mg by mouth daily.    Marland Kitchen HYDROcodone-homatropine (HYCODAN) 5-1.5 MG/5ML syrup Take 5 mLs by mouth every 6 (six) hours as needed for cough. 180 mL 0  . predniSONE (DELTASONE) 20 MG tablet Take 3 tablets (60 mg total) by mouth daily. 60 mg daily x 5 days 15 tablet 0  . promethazine-dextromethorphan (PROMETHAZINE-DM) 6.25-15 MG/5ML syrup Take 5 mLs by mouth 4 (four) times daily as needed for cough. 118 mL 0  . pyridOXINE (VITAMIN B-6) 100 MG tablet Take 100 mg by mouth daily.      No current facility-administered medications on file prior to visit.    Review of Systems  Constitutional: Negative for fever and chills.  Respiratory: Negative for chest tightness, shortness of breath and wheezing.   Cardiovascular: Negative for chest pain.  Gastrointestinal: Positive  for nausea.  Neurological: Positive for dizziness and weakness.      Objective:    BP 118/84 mmHg  Pulse 90  Temp(Src) 98.7 F (37.1 C) (Oral)  Resp 18  Ht 5\' 6"  (1.676 m)  Wt 229 lb (103.874 kg)  BMI 36.98 kg/m2  SpO2 98% Nursing note and vital signs reviewed.  Physical Exam  Constitutional: She is oriented to person, place, and time. She appears well-developed and well-nourished. No distress.  HENT:  Mouth/Throat: Uvula is midline, oropharynx is clear and moist and mucous membranes are normal.  Cardiovascular: Normal rate, regular rhythm, normal heart sounds and intact distal pulses.    Pulmonary/Chest: Effort normal and breath sounds normal.  Neurological: She is alert and oriented to person, place, and time.  Skin: Skin is warm and dry.  Psychiatric: She has a normal mood and affect. Her behavior is normal. Judgment and thought content normal.       Assessment & Plan:

## 2014-07-16 NOTE — Patient Instructions (Addendum)
Thank you for choosing [facility].  Summary/Instructions:  Be sure to eat frequent small meals, if your symptoms do not improve by Monday, please let us know and we can consider additional steroid if necessary.  Please stop by the lab on the basement level of the building for your blood work. Your results will be released to MyChart (or called to you) after review, usually within 72 hours after test completion. If any changes need to be made, you will be notified at that same time.  If your symptoms worsen or fail to improve, please contact our office for further instruction, or in case of emergency go directly to the emergency room at the closest medical facility.

## 2014-07-16 NOTE — Progress Notes (Signed)
Pre visit review using our clinic review tool, if applicable. No additional management support is needed unless otherwise documented below in the visit note. 

## 2014-07-23 ENCOUNTER — Encounter: Payer: Self-pay | Admitting: Internal Medicine

## 2014-07-23 ENCOUNTER — Ambulatory Visit (INDEPENDENT_AMBULATORY_CARE_PROVIDER_SITE_OTHER): Payer: Managed Care, Other (non HMO) | Admitting: Internal Medicine

## 2014-07-23 ENCOUNTER — Other Ambulatory Visit (INDEPENDENT_AMBULATORY_CARE_PROVIDER_SITE_OTHER): Payer: Managed Care, Other (non HMO)

## 2014-07-23 VITALS — BP 128/82 | HR 92 | Temp 98.0°F | Ht 66.0 in | Wt 231.0 lb

## 2014-07-23 DIAGNOSIS — E669 Obesity, unspecified: Secondary | ICD-10-CM

## 2014-07-23 DIAGNOSIS — T7800XD Anaphylactic reaction due to unspecified food, subsequent encounter: Secondary | ICD-10-CM

## 2014-07-23 DIAGNOSIS — Z0000 Encounter for general adult medical examination without abnormal findings: Secondary | ICD-10-CM

## 2014-07-23 DIAGNOSIS — Z1239 Encounter for other screening for malignant neoplasm of breast: Secondary | ICD-10-CM

## 2014-07-23 LAB — HEPATIC FUNCTION PANEL
ALBUMIN: 4.1 g/dL (ref 3.5–5.2)
ALK PHOS: 68 U/L (ref 39–117)
ALT: 16 U/L (ref 0–35)
AST: 13 U/L (ref 0–37)
Bilirubin, Direct: 0 mg/dL (ref 0.0–0.3)
Total Bilirubin: 0.4 mg/dL (ref 0.2–1.2)
Total Protein: 7.7 g/dL (ref 6.0–8.3)

## 2014-07-23 LAB — LIPID PANEL
CHOL/HDL RATIO: 5
CHOLESTEROL: 197 mg/dL (ref 0–200)
HDL: 41.4 mg/dL (ref >39.00)
NonHDL: 155.6
Triglycerides: 206 mg/dL — ABNORMAL HIGH (ref 0.0–149.0)
VLDL: 41.2 mg/dL — ABNORMAL HIGH (ref 0.0–40.0)

## 2014-07-23 LAB — URINALYSIS, ROUTINE W REFLEX MICROSCOPIC
Bilirubin Urine: NEGATIVE
KETONES UR: NEGATIVE
Nitrite: NEGATIVE
PH: 6 (ref 5.0–8.0)
SPECIFIC GRAVITY, URINE: 1.02 (ref 1.000–1.030)
TOTAL PROTEIN, URINE-UPE24: NEGATIVE
Urine Glucose: NEGATIVE
Urobilinogen, UA: 0.2 (ref 0.0–1.0)

## 2014-07-23 LAB — CBC WITH DIFFERENTIAL/PLATELET
BASOS ABS: 0.1 10*3/uL (ref 0.0–0.1)
Basophils Relative: 1.2 % (ref 0.0–3.0)
EOS PCT: 3.8 % (ref 0.0–5.0)
Eosinophils Absolute: 0.2 10*3/uL (ref 0.0–0.7)
HEMATOCRIT: 42.6 % (ref 36.0–46.0)
HEMOGLOBIN: 14.7 g/dL (ref 12.0–15.0)
LYMPHS ABS: 2.3 10*3/uL (ref 0.7–4.0)
Lymphocytes Relative: 35.9 % (ref 12.0–46.0)
MCHC: 34.6 g/dL (ref 30.0–36.0)
MCV: 88.1 fl (ref 78.0–100.0)
Monocytes Absolute: 0.4 10*3/uL (ref 0.1–1.0)
Monocytes Relative: 6.6 % (ref 3.0–12.0)
NEUTROS ABS: 3.4 10*3/uL (ref 1.4–7.7)
Neutrophils Relative %: 52.5 % (ref 43.0–77.0)
PLATELETS: 213 10*3/uL (ref 150.0–400.0)
RBC: 4.84 Mil/uL (ref 3.87–5.11)
RDW: 13.1 % (ref 11.5–15.5)
WBC: 6.4 10*3/uL (ref 4.0–10.5)

## 2014-07-23 LAB — BASIC METABOLIC PANEL
BUN: 10 mg/dL (ref 6–23)
CALCIUM: 9.7 mg/dL (ref 8.4–10.5)
CHLORIDE: 104 meq/L (ref 96–112)
CO2: 30 mEq/L (ref 19–32)
Creatinine, Ser: 0.83 mg/dL (ref 0.40–1.20)
GFR: 94.49 mL/min (ref >60.00)
Glucose, Bld: 106 mg/dL — ABNORMAL HIGH (ref 70–99)
Potassium: 4 mEq/L (ref 3.5–5.1)
Sodium: 140 mEq/L (ref 135–145)

## 2014-07-23 LAB — LDL CHOLESTEROL, DIRECT: LDL DIRECT: 121 mg/dL

## 2014-07-23 LAB — TSH: TSH: 0.5 u[IU]/mL (ref 0.35–4.50)

## 2014-07-23 MED ORDER — BUDESONIDE-FORMOTEROL FUMARATE 80-4.5 MCG/ACT IN AERO: 2.0000 | INHALATION_SPRAY | Freq: Two times a day (BID) | RESPIRATORY_TRACT | Status: DC

## 2014-07-23 NOTE — Patient Instructions (Addendum)
It was good to see you today.  We have reviewed your prior records including labs and tests today  Health Maintenance reviewed - all recommended immunizations and age-appropriate screenings are up-to-date.  Test(s) ordered today. Your results will be released to [facility] (or called to you) after review, usually within 72hours after test completion. If any changes need to be made, you will be notified at that same time.  Medications reviewed and updated, no changes recommended at this time.  Refer to new allergist for second opinion and for screening mammogram. We will call you with these appointments  Please schedule followup in 12 months for annual exam and labs, call sooner if problems.  Health Maintenance Adopting a healthy lifestyle and getting preventive care can go a long way to promote health and wellness. Talk with your health care provider about what schedule of regular examinations is right for you. This is a good chance for you to check in with your provider about disease prevention and staying healthy. In between checkups, there are plenty of things you can do on your own. Experts have done a lot of research about which lifestyle changes and preventive measures are most likely to keep you healthy. Ask your health care provider for more information. WEIGHT AND DIET  Eat a healthy diet  Be sure to include plenty of vegetables, fruits, low-fat dairy products, and lean protein.  Do not eat a lot of foods high in solid fats, added sugars, or salt.  Get regular exercise. This is one of the most important things you can do for your health.  Most adults should exercise for at least 150 minutes each week. The exercise should increase your heart rate and make you sweat (moderate-intensity exercise).  Most adults should also do strengthening exercises at least twice a week. This is in addition to the moderate-intensity exercise.  Maintain a healthy weight  Body mass index (BMI) is a  measurement that can be used to identify possible weight problems. It estimates body fat based on height and weight. Your health care provider can help determine your BMI and help you achieve or maintain a healthy weight.  For females 67 years of age and older:   A BMI below 18.5 is considered underweight.  A BMI of 18.5 to 24.9 is normal.  A BMI of 25 to 29.9 is considered overweight.  A BMI of 30 and above is considered obese.  Watch levels of cholesterol and blood lipids  You should start having your blood tested for lipids and cholesterol at 48 years of age, then have this test every 5 years.  You may need to have your cholesterol levels checked more often if:  Your lipid or cholesterol levels are high.  You are older than 48 years of age.  You are at high risk for heart disease.  CANCER SCREENING   Lung Cancer  Lung cancer screening is recommended for adults 67-78 years old who are at high risk for lung cancer because of a history of smoking.  A yearly low-dose CT scan of the lungs is recommended for people who:  Currently smoke.  Have quit within the past 15 years.  Have at least a 30-pack-year history of smoking. A pack year is smoking an average of one pack of cigarettes a day for 1 year.  Yearly screening should continue until it has been 15 years since you quit.  Yearly screening should stop if you develop a health problem that would prevent you from having  lung cancer treatment.  Breast Cancer  Practice breast self-awareness. This means understanding how your breasts normally appear and feel.  It also means doing regular breast self-exams. Let your health care provider know about any changes, no matter how small.  If you are in your 20s or 30s, you should have a clinical breast exam (CBE) by a health care provider every 1-3 years as part of a regular health exam.  If you are 40 or older, have a CBE every year. Also consider having a breast X-ray  (mammogram) every year.  If you have a family history of breast cancer, talk to your health care provider about genetic screening.  If you are at high risk for breast cancer, talk to your health care provider about having an MRI and a mammogram every year.  Breast cancer gene (BRCA) assessment is recommended for women who have family members with BRCA-related cancers. BRCA-related cancers include:  Breast.  Ovarian.  Tubal.  Peritoneal cancers.  Results of the assessment will determine the need for genetic counseling and BRCA1 and BRCA2 testing. Cervical Cancer Routine pelvic examinations to screen for cervical cancer are no longer recommended for nonpregnant women who are considered low risk for cancer of the pelvic organs (ovaries, uterus, and vagina) and who do not have symptoms. A pelvic examination may be necessary if you have symptoms including those associated with pelvic infections. Ask your health care provider if a screening pelvic exam is right for you.   The Pap test is the screening test for cervical cancer for women who are considered at risk.  If you had a hysterectomy for a problem that was not cancer or a condition that could lead to cancer, then you no longer need Pap tests.  If you are older than 65 years, and you have had normal Pap tests for the past 10 years, you no longer need to have Pap tests.  If you have had past treatment for cervical cancer or a condition that could lead to cancer, you need Pap tests and screening for cancer for at least 20 years after your treatment.  If you no longer get a Pap test, assess your risk factors if they change (such as having a new sexual partner). This can affect whether you should start being screened again.  Some women have medical problems that increase their chance of getting cervical cancer. If this is the case for you, your health care provider may recommend more frequent screening and Pap tests.  The human  papillomavirus (HPV) test is another test that may be used for cervical cancer screening. The HPV test looks for the virus that can cause cell changes in the cervix. The cells collected during the Pap test can be tested for HPV.  The HPV test can be used to screen women 30 years of age and older. Getting tested for HPV can extend the interval between normal Pap tests from three to five years.  An HPV test also should be used to screen women of any age who have unclear Pap test results.  After 48 years of age, women should have HPV testing as often as Pap tests.  Colorectal Cancer  This type of cancer can be detected and often prevented.  Routine colorectal cancer screening usually begins at 48 years of age and continues through 48 years of age.  Your health care provider may recommend screening at an earlier age if you have risk factors for colon cancer.  Your health care provider   may also recommend using home test kits to check for hidden blood in the stool.  A small camera at the end of a tube can be used to examine your colon directly (sigmoidoscopy or colonoscopy). This is done to check for the earliest forms of colorectal cancer.  Routine screening usually begins at age 50.  Direct examination of the colon should be repeated every 5-10 years through 48 years of age. However, you may need to be screened more often if early forms of precancerous polyps or small growths are found. Skin Cancer  Check your skin from head to toe regularly.  Tell your health care provider about any new moles or changes in moles, especially if there is a change in a mole's shape or color.  Also tell your health care provider if you have a mole that is larger than the size of a pencil eraser.  Always use sunscreen. Apply sunscreen liberally and repeatedly throughout the day.  Protect yourself by wearing long sleeves, pants, a wide-brimmed hat, and sunglasses whenever you are outside. HEART DISEASE,  DIABETES, AND HIGH BLOOD PRESSURE   Have your blood pressure checked at least every 1-2 years. High blood pressure causes heart disease and increases the risk of stroke.  If you are between 55 years and 79 years old, ask your health care provider if you should take aspirin to prevent strokes.  Have regular diabetes screenings. This involves taking a blood sample to check your fasting blood sugar level.  If you are at a normal weight and have a low risk for diabetes, have this test once every three years after 48 years of age.  If you are overweight and have a high risk for diabetes, consider being tested at a younger age or more often. PREVENTING INFECTION  Hepatitis B  If you have a higher risk for hepatitis B, you should be screened for this virus. You are considered at high risk for hepatitis B if:  You were born in a country where hepatitis B is common. Ask your health care provider which countries are considered high risk.  Your parents were born in a high-risk country, and you have not been immunized against hepatitis B (hepatitis B vaccine).  You have HIV or AIDS.  You use needles to inject street drugs.  You live with someone who has hepatitis B.  You have had sex with someone who has hepatitis B.  You get hemodialysis treatment.  You take certain medicines for conditions, including cancer, organ transplantation, and autoimmune conditions. Hepatitis C  Blood testing is recommended for:  Everyone born from 1945 through 1965.  Anyone with known risk factors for hepatitis C. Sexually transmitted infections (STIs)  You should be screened for sexually transmitted infections (STIs) including gonorrhea and chlamydia if:  You are sexually active and are younger than 48 years of age.  You are older than 48 years of age and your health care provider tells you that you are at risk for this type of infection.  Your sexual activity has changed since you were last screened and  you are at an increased risk for chlamydia or gonorrhea. Ask your health care provider if you are at risk.  If you do not have HIV, but are at risk, it may be recommended that you take a prescription medicine daily to prevent HIV infection. This is called pre-exposure prophylaxis (PrEP). You are considered at risk if:  You are sexually active and do not regularly use condoms or know the   HIV status of your partner(s).  You take drugs by injection.  You are sexually active with a partner who has HIV. Talk with your health care provider about whether you are at high risk of being infected with HIV. If you choose to begin PrEP, you should first be tested for HIV. You should then be tested every 3 months for as long as you are taking PrEP.  PREGNANCY   If you are premenopausal and you may become pregnant, ask your health care provider about preconception counseling.  If you may become pregnant, take 400 to 800 micrograms (mcg) of folic acid every day.  If you want to prevent pregnancy, talk to your health care provider about birth control (contraception). OSTEOPOROSIS AND MENOPAUSE   Osteoporosis is a disease in which the bones lose minerals and strength with aging. This can result in serious bone fractures. Your risk for osteoporosis can be identified using a bone density scan.  If you are 65 years of age or older, or if you are at risk for osteoporosis and fractures, ask your health care provider if you should be screened.  Ask your health care provider whether you should take a calcium or vitamin D supplement to lower your risk for osteoporosis.  Menopause may have certain physical symptoms and risks.  Hormone replacement therapy may reduce some of these symptoms and risks. Talk to your health care provider about whether hormone replacement therapy is right for you.  HOME CARE INSTRUCTIONS   Schedule regular health, dental, and eye exams.  Stay current with your immunizations.   Do  not use any tobacco products including cigarettes, chewing tobacco, or electronic cigarettes.  If you are pregnant, do not drink alcohol.  If you are breastfeeding, limit how much and how often you drink alcohol.  Limit alcohol intake to no more than 1 drink per day for nonpregnant women. One drink equals 12 ounces of beer, 5 ounces of wine, or 1 ounces of hard liquor.  Do not use street drugs.  Do not share needles.  Ask your health care provider for help if you need support or information about quitting drugs.  Tell your health care provider if you often feel depressed.  Tell your health care provider if you have ever been abused or do not feel safe at home. Document Released: 11/29/2010 Document Revised: 09/30/2013 Document Reviewed: 04/17/2013 [employer] Patient Information 2015 [employer]. This information is not intended to replace advice given to you by your health care provider. Make sure you discuss any questions you have with your health care provider.  

## 2014-07-23 NOTE — Progress Notes (Signed)
Subjective:    Patient ID: Megan Wilkerson, female    DOB: 1965/11/12, 49 y.o.   MRN: [id number]  HPI  patient is here today for annual physical. Patient feels well overall. Reviewed interval events and current concerns  Past Medical History  Diagnosis Date  . GERD   . HYPERLIPIDEMIA, WITH LOW HDL   . 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 MIGRAINE HEADACHE   . PERIPHERAL NEUROPATHY   . 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 Hives of unknown origin     chronic and recurrent urticaria since 01/2012   Family History  Problem Relation Age of Onset  . Diabetes Mother   . Prostate cancer Father   . Hypertension Father   . Heart disease Father   . Diabetes Brother   . Alcohol abuse Other   . Arthritis Other   . Hypertension Other    History  Substance Use Topics  . Smoking status: Never Smoker   . Smokeless tobacco: Never Used  . Alcohol Use: No    Review of Systems  Constitutional: Negative for fatigue and unexpected weight change.  Respiratory: Negative for cough, shortness of breath and wheezing.   Cardiovascular: Negative for chest pain, palpitations and leg swelling.  Gastrointestinal: Negative for nausea, abdominal pain and diarrhea.  Skin: Positive for rash (intermittent hives, none at this time).  Neurological: Negative for dizziness, weakness, light-headedness and headaches.  Psychiatric/Behavioral: Negative for dysphoric mood. The patient is not nervous/anxious.   All other systems reviewed and are negative.      Objective:    Physical Exam  Constitutional: She is oriented to person, place, and time. She appears well-developed and well-nourished. No distress.  overweight  HENT:  Head: Normocephalic and atraumatic.  Right Ear: External ear normal.  Left Ear: External ear normal.  Nose: Nose normal.  Mouth/Throat: Oropharynx is clear and moist. No oropharyngeal exudate.  Eyes: EOM are normal. Pupils are equal, round, and  reactive to light. Right eye exhibits no discharge. Left eye exhibits no discharge. No scleral icterus.  Neck: Normal range of motion. Neck supple. No JVD present. No tracheal deviation present. No thyromegaly present.  Cardiovascular: Normal rate, regular rhythm, normal heart sounds and intact distal pulses.  Exam reveals no friction rub.   No murmur heard. Pulmonary/Chest: Effort normal and breath sounds normal. No respiratory distress. She has no wheezes. She has no rales. She exhibits no tenderness.  Abdominal: Soft. Bowel sounds are normal. She exhibits no distension and no mass. There is no tenderness. There is no rebound and no guarding.  Genitourinary:  Defer to gyn  Musculoskeletal: Normal range of motion.  No gross deformities  Lymphadenopathy:    She has no cervical adenopathy.  Neurological: She is alert and oriented to person, place, and time. She has normal reflexes. No cranial nerve deficit.  Skin: Skin is warm and dry. No rash noted. She is not diaphoretic. No erythema.  Psychiatric: She has a normal mood and affect. Her behavior is normal. Judgment and thought content normal.  Nursing note and vitals reviewed.   BP 128/82 mmHg  Pulse 92  Temp(Src) 98 F (36.7 C) (Oral)  Ht 5\' 6"  (1.676 m)  Wt 231 lb (104.781 kg)  BMI 37.30 kg/m2  SpO2 95% Wt Readings from Last 3 Encounters:  07/23/14 231 lb (104.781 kg)  07/16/14 229 lb (103.874 kg)  06/06/14 229 lb 4 oz (103.987 kg)  Lab Results  Component Value Date   WBC 10.6* 07/16/2014   HGB 15.1* 07/16/2014   HCT 43.9 07/16/2014   PLT 256.0 07/16/2014   GLUCOSE 95 07/16/2014   CHOL 185 09/10/2009   TRIG 101.0 09/10/2009   HDL 36.40* 09/10/2009   LDLCALC 128* 09/10/2009   ALT 16 08/29/2012   AST 14 08/29/2012   NA 142 07/16/2014   K 3.6 07/16/2014   CL 104 07/16/2014   CREATININE 0.95 07/16/2014   BUN 15 07/16/2014   CO2 31 07/16/2014   TSH 0.79 08/29/2012   HGBA1C 6.0 08/21/2008    No results found.      Assessment & Plan:   CPX/z00.00 - Patient has been counseled on age-appropriate routine health concerns for screening and prevention. These are reviewed and up-to-date. Immunizations are up-to-date or declined (flu not given in setting of recurrent severe anaphylaxis with unknown trigger). Labs ordered and reviewed.  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 Maple Hudson for allergy evaluation, will refer for second opinion now      Relevant Orders   Ambulatory referral to Allergy   Obese    Wt Readings from Last 3 Encounters:  07/23/14 231 lb (104.781 kg)  07/16/14 229 lb (103.874 kg)  06/06/14 229 lb 4 oz (103.987 kg)   The patient is asked to make an attempt to improve diet and exercise patterns to aid in medical management of this problem.        Other Visit Diagnoses    Routine general medical examination at a health care facility    -  Primary    Relevant Orders    Basic metabolic panel    CBC with Differential/Platelet    Hepatic function panel    Lipid panel    TSH    Urinalysis, Routine w reflex microscopic    HIV antibody    Breast screening        Relevant Orders    MM DIGITAL SCREENING BILATERAL        Rene Paci, MD

## 2014-07-23 NOTE — Assessment & Plan Note (Signed)
Wt Readings from Last 3 Encounters:  07/23/14 231 lb (104.781 kg)  07/16/14 229 lb (103.874 kg)  06/06/14 229 lb 4 oz (103.987 kg)   The patient is asked to make an attempt to improve diet and exercise patterns to aid in medical management of this problem.

## 2014-07-23 NOTE — Progress Notes (Signed)
Pre visit review using our clinic review tool, if applicable. No additional management support is needed unless otherwise documented below in the visit note. 

## 2014-07-23 NOTE — Assessment & Plan Note (Signed)
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 Maple HudsonYoung for allergy evaluation, will refer for second opinion now

## 2014-07-24 LAB — HIV ANTIBODY (ROUTINE TESTING W REFLEX): HIV 1&2 Ab, 4th Generation: NONREACTIVE

## 2014-08-29 ENCOUNTER — Encounter: Payer: Self-pay | Admitting: Family

## 2014-08-29 ENCOUNTER — Ambulatory Visit (INDEPENDENT_AMBULATORY_CARE_PROVIDER_SITE_OTHER): Payer: Managed Care, Other (non HMO) | Admitting: Family

## 2014-08-29 VITALS — BP 132/80 | HR 88 | Temp 98.2°F | Ht 66.0 in | Wt 233.5 lb

## 2014-08-29 DIAGNOSIS — J329 Chronic sinusitis, unspecified: Secondary | ICD-10-CM | POA: Insufficient documentation

## 2014-08-29 DIAGNOSIS — J019 Acute sinusitis, unspecified: Secondary | ICD-10-CM

## 2014-08-29 MED ORDER — HYDROCOD POLST-CHLORPHEN POLST 10-8 MG/5ML PO LQCR: 5.0000 mL | Freq: Every evening | ORAL | Status: DC | PRN

## 2014-08-29 MED ORDER — AMOXICILLIN-POT CLAVULANATE 875-125 MG PO TABS: 1.0000 | ORAL_TABLET | Freq: Two times a day (BID) | ORAL | Status: DC

## 2014-08-29 MED ORDER — BENZONATATE 100 MG PO CAPS: 100.0000 mg | ORAL_CAPSULE | Freq: Two times a day (BID) | ORAL | Status: DC | PRN

## 2014-08-29 NOTE — Progress Notes (Signed)
Subjective:    Patient ID: Megan Wilkerson, female    DOB: 12/31/1965, 49 y.o.   MRN: [id number]  Chief Complaint  Patient presents with  . Cough    for a few days.     HPI:  Megan Wilkerson is a 49 y.o. female who presents today for an acute visit.   This is a new problem.  Associated symptom of congestion, productive cough with purulent discharge, sinus pressure, and sore throat having been going on for several days. Modifying factors include Mucinex  And a previous cough syrup which did not provide much relief.    Allergies  Allergen Reactions  . Other Anaphylaxis    Archie Balboa rancher gummies and paprika   . Peanuts [Peanut Oil] Anaphylaxis and Itching  . Corn Dextrin Other (See Comments)    Plain corn ok, popcorn causes hives  . Gluten Meal Other (See Comments)    cough  . Shellfish Allergy Itching  . Soy Allergy Other (See Comments)    coughing  . Sunflower Oil Itching  . Tomato Itching  . Wheat Bran     swelling  . Morphine Itching and Rash    Current Outpatient Prescriptions on File Prior to Visit  Medication Sig Dispense Refill  . Albuterol Sulfate (PROAIR RESPICLICK) 108 (90 BASE) MCG/ACT AEPB Inhale 1-2 puffs into the lungs every 6 (six) hours. 1 each 11  . brimonidine (ALPHAGAN) 0.15 % ophthalmic solution Place 1 drop into both eyes 2 (two) times daily.    . budesonide-formoterol (SYMBICORT) 80-4.5 MCG/ACT inhaler Inhale 2 puffs into the lungs 2 (two) times daily. 1 Inhaler 11  . diphenhydrAMINE (BENADRYL) 25 MG tablet Take 2 tablets (50 mg total) by mouth every 4 (four) hours as needed for itching or allergies. 30 tablet 0  . EPINEPHrine 0.3 mg/0.3 mL IJ SOAJ injection Inject 0.3 mLs (0.3 mg total) into the muscle once. 1 Device 1  . fexofenadine (ALLEGRA) 180 MG tablet Take 180 mg by mouth daily.    Marland Kitchen pyridOXINE (VITAMIN B-6) 100 MG tablet Take 100 mg by mouth daily.      No current facility-administered medications on file prior to visit.    Review of  Systems  Constitutional: Positive for fever.  HENT: Positive for sinus pressure and sore throat.   Respiratory: Positive for cough. Negative for chest tightness and shortness of breath.   Neurological: Positive for headaches.      Objective:    BP 132/80 mmHg  Pulse 88  Temp(Src) 98.2 F (36.8 C) (Oral)  Ht 5\' 6"  (1.676 m)  Wt 233 lb 8 oz (105.915 kg)  BMI 37.71 kg/m2  SpO2 97% Nursing note and vital signs reviewed.  Physical Exam  Constitutional: She is oriented to person, place, and time. She appears well-developed and well-nourished. No distress.  HENT:  Right Ear: Hearing, tympanic membrane, external ear and ear canal normal.  Left Ear: Hearing, tympanic membrane, external ear and ear canal normal.  Nose: Right sinus exhibits maxillary sinus tenderness and frontal sinus tenderness. Left sinus exhibits maxillary sinus tenderness and frontal sinus tenderness.  Mouth/Throat: Uvula is midline, oropharynx is clear and moist and mucous membranes are normal.  Cardiovascular: Normal rate, regular rhythm, normal heart sounds and intact distal pulses.   Pulmonary/Chest: Effort normal and breath sounds normal.  Neurological: She is alert and oriented to person, place, and time.  Skin: Skin is warm and dry.  Psychiatric: She has a normal mood and affect. Her behavior is normal. Judgment and  thought content normal.       Assessment & Plan:

## 2014-08-29 NOTE — Patient Instructions (Addendum)
Thank you for choosing [facility].  Summary/Instructions:  Your prescription(s) have been submitted to your pharmacy or been printed and provided for you. Please take as directed and contact our office if you believe you are having problem(s) with the medication(s) or have any questions.  If your symptoms worsen or fail to improve, please contact our office for further instruction, or in case of emergency go directly to the emergency room at the closest medical facility.     

## 2014-08-29 NOTE — Assessment & Plan Note (Signed)
Symptoms and exam consistent with sinusitis. Start Augmentin. Start Tussionex as needed for cough and sleep. Start Tessalon as needed during the day for cough. Continue over-the-counter medications as needed for symptom relief and supportive care. Follow up if symptoms worsen or fail to improve.

## 2014-08-29 NOTE — Progress Notes (Signed)
Pre visit review using our clinic review tool, if applicable. No additional management support is needed unless otherwise documented below in the visit note. 

## 2014-12-09 ENCOUNTER — Encounter: Payer: Self-pay | Admitting: Internal Medicine

## 2014-12-09 ENCOUNTER — Ambulatory Visit (INDEPENDENT_AMBULATORY_CARE_PROVIDER_SITE_OTHER): Payer: Managed Care, Other (non HMO) | Admitting: Internal Medicine

## 2014-12-09 VITALS — BP 124/84 | HR 89 | Temp 98.1°F | Resp 16 | Wt 227.0 lb

## 2014-12-09 DIAGNOSIS — T783XXD Angioneurotic edema, subsequent encounter: Secondary | ICD-10-CM | POA: Diagnosis not present

## 2014-12-09 MED ORDER — PREDNISONE 10 MG PO TABS: 10.0000 mg | ORAL_TABLET | Freq: Every day | ORAL | Status: DC

## 2014-12-09 NOTE — Progress Notes (Signed)
Subjective:    Patient ID: Megan Wilkerson, female    DOB: May 20, 1966, 49 y.o.   MRN: [id number]  HPI After eating a salad dressing 11/29/14 containing pepper and seasoning she exhibited shortness of breath, hives, itching, and swelling of the tongue. She has epinephrine but did not use this. She has taken Benadryl 25 mg every 4-6 hours since the episode. She's also been using Symbicort 2 puffs every 2 hours rather than albuterol as a rescue since 12/07/14.  She continues to have intermittent wheezing and cough as well as the diffuse itching.  She has a complicated history of food allergies and sees Dr. Maple Hudson. She is overdue for follow-up. She had been on Singulair but states it makes her "angry"   Review of Systems She has no fever or chills.   She does have some sweats..  Purulence absent.  Diarrhea not present.     Objective:   Physical Exam  General appearance:Adequately nourished; no acute distress or increased work of breathing is present.    Lymphatic: No  lymphadenopathy about the head, neck, or axilla .  Eyes: No conjunctival inflammation or lid edema is present. There is no scleral icterus.  Ears:  External ear exam shows no significant lesions or deformities.  Otoscopic examination reveals clear canals, tympanic membranes are intact bilaterally without bulging, retraction, inflammation or discharge.  Nose:  External nasal examination shows no deformity or inflammation. Nasal mucosa are pink and moist without lesions or exudates No septal dislocation or deviation.No obstruction to airflow.   Oral exam: Dramatic laryngitis with a very squeaky voice. Dental hygiene is good; lips and gums are healthy appearing.There is no oropharyngeal erythema or exudate . Surprisingly there is no evidence of oral pharyngeal thrush  Neck:  No deformities, thyromegaly, masses, or tenderness noted.   Supple with full range of motion without pain.   Heart:  Normal rate and regular rhythm.  S1 and S2 normal without gallop, murmur, click, rub or other extra sounds.   Lungs:Chest clear to auscultation; no wheezes, rhonchi,rales ,or rubs present.  Extremities:  No cyanosis, edema, or clubbing  noted    Skin: Warm & dry w/o tenting . No significant lesions or rash. 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 Residual urticaria present.  Plan: See orders & recommendations

## 2014-12-09 NOTE — Progress Notes (Signed)
Pre visit review using our clinic review tool, if applicable. No additional management support is needed unless otherwise documented below in the visit note. 

## 2014-12-09 NOTE — Patient Instructions (Signed)
Albuterol is a rescue inhaler which should be used as infrequently as possible; it should never be used more than 1-2 puffs every 4 hours. It may  be used 15-30 minutes before exercise if that is also  a trigger for your asthma.   If it is required more than 2-3 times per week; the asthma is not well controlled. Symbicort is a maintenance agent should be considered to control smooth muscle spasm and airway inflammation  and to prevent adverse effects from excess albuterol use.Those adverse effects can include health or life threatening heart rhythm irregularities.  Maximal dose of Symbicort one - two inhalations every 12 hours; gargle and spit after use  Gargle with saline up to 4 times a day over the next several days

## 2015-01-23 ENCOUNTER — Encounter (HOSPITAL_COMMUNITY): Payer: Self-pay | Admitting: Emergency Medicine

## 2015-01-23 ENCOUNTER — Emergency Department (HOSPITAL_COMMUNITY)
Admission: EM | Admit: 2015-01-23 | Discharge: 2015-01-23 | Disposition: A | Discharge status: Home / Self Care | Payer: Managed Care, Other (non HMO) | Attending: Emergency Medicine | Admitting: Emergency Medicine

## 2015-01-23 ENCOUNTER — Telehealth: Payer: Self-pay | Admitting: Internal Medicine

## 2015-01-23 DIAGNOSIS — Z7951 Long term (current) use of inhaled steroids: Secondary | ICD-10-CM | POA: Diagnosis not present

## 2015-01-23 DIAGNOSIS — K219 Gastro-esophageal reflux disease without esophagitis: Secondary | ICD-10-CM | POA: Insufficient documentation

## 2015-01-23 DIAGNOSIS — J4521 Mild intermittent asthma with (acute) exacerbation: Secondary | ICD-10-CM | POA: Insufficient documentation

## 2015-01-23 DIAGNOSIS — T781XXA Other adverse food reactions, not elsewhere classified, initial encounter: Secondary | ICD-10-CM | POA: Diagnosis not present

## 2015-01-23 DIAGNOSIS — Z7952 Long term (current) use of systemic steroids: Secondary | ICD-10-CM | POA: Insufficient documentation

## 2015-01-23 DIAGNOSIS — Z79899 Other long term (current) drug therapy: Secondary | ICD-10-CM | POA: Insufficient documentation

## 2015-01-23 DIAGNOSIS — E785 Hyperlipidemia, unspecified: Secondary | ICD-10-CM | POA: Insufficient documentation

## 2015-01-23 DIAGNOSIS — Z872 Personal history of diseases of the skin and subcutaneous tissue: Secondary | ICD-10-CM | POA: Insufficient documentation

## 2015-01-23 DIAGNOSIS — Z8679 Personal history of other diseases of the circulatory system: Secondary | ICD-10-CM | POA: Diagnosis not present

## 2015-01-23 DIAGNOSIS — J4520 Mild intermittent asthma, uncomplicated: Secondary | ICD-10-CM

## 2015-01-23 DIAGNOSIS — Z91018 Allergy to other foods: Secondary | ICD-10-CM

## 2015-01-23 DIAGNOSIS — Z8669 Personal history of other diseases of the nervous system and sense organs: Secondary | ICD-10-CM | POA: Diagnosis not present

## 2015-01-23 MED ORDER — DEXAMETHASONE SODIUM PHOSPHATE 10 MG/ML IJ SOLN
10.0000 mg | Freq: Once | INTRAMUSCULAR | Status: AC
  Administered 2015-01-23: 10 mg via INTRAMUSCULAR
  Filled 2015-01-23: qty 1

## 2015-01-23 MED ORDER — PREDNISONE 20 MG PO TABS: ORAL_TABLET | ORAL | Status: DC

## 2015-01-23 NOTE — Discharge Instructions (Signed)
Food Allergy A food allergy occurs from eating something you are sensitive to. Food allergies occur in all age groups. It may be passed to you from your parents (heredity).  CAUSES  Some common causes are cow's milk, seafood, eggs, nuts (including peanut butter), wheat, and soybeans. SYMPTOMS  Common problems are:   Swelling around the mouth.  An itchy, red rash.  Hives.  Vomiting.  Diarrhea. Severe allergic reactions are life-threatening. This reaction is called anaphylaxis. It can cause the mouth and throat to swell. This makes it hard to breathe and swallow. In severe reactions, only a small amount of food may be fatal within seconds. HOME CARE INSTRUCTIONS   If you are unsure what caused the reaction, keep a diary of foods eaten and symptoms that followed. Avoid foods that cause reactions.  If hives or rash are present:  Take medicines as directed.  Use an over-the-counter antihistamine (diphenhydramine) to treat hives and itching as needed.  Apply cold compresses to the skin or take baths in cool water. Avoid hot baths or showers. These will increase the redness and itching.  If you are severely allergic:  Hospitalization is often required following a severe reaction.  Wear a medical alert bracelet or necklace that describes the allergy.  Carry your anaphylaxis kit or epinephrine injection with you at all times. Both you and your family members should know how to use this. This can be lifesaving if you have a severe reaction. If epinephrine is used, it is important for you to seek immediate medical care or call your local emergency services (911 in [place] When the epinephrine wears off, it can be followed by a delayed reaction, which can be fatal.  Replace your epinephrine immediately after use in case of another reaction.  Ask your caregiver for instructions if you have not been taught how to use an epinephrine injection.  Do not drive until medicines used to treat the  reaction have worn off, unless approved by your caregiver. SEEK MEDICAL CARE IF:   You suspect a food allergy. Symptoms generally happen within 30 minutes of eating a food.  Your symptoms have not gone away within 2 days. See your caregiver sooner if symptoms are getting worse.  You develop new symptoms.  You want to retest yourself with a food or drink you think causes an allergic reaction. Never do this if an anaphylactic reaction to that food or drink has happened before.  There is a return of the symptoms which brought you to your caregiver. SEEK IMMEDIATE MEDICAL CARE IF:   You have trouble breathing, are wheezing, or you have a tight feeling in your chest or throat.  You have a swollen mouth, or you have hives, swelling, or itching all over your body. Use your epinephrine injection immediately. This is given into the outside of your thigh, deep into the muscle. Following use of the epinephrine injection, seek help right away. Seek immediate medical care or call your local emergency services (911 in [place] MAKE SURE YOU:   Understand these instructions.  Will watch your condition.  Will get help right away if you are not doing well or get worse. Document Released: 05/13/2000 Document Revised: 08/08/2011 Document Reviewed: 01/03/2008 [facility] Patient Information 2015 [place], [place]. This information is not intended to replace advice given to you by your health care provider. Make sure you discuss any questions you have with your health care provider.  Asthma Asthma is a recurring condition in which the airways tighten and narrow.  Asthma can make it difficult to breathe. It can cause coughing, wheezing, and shortness of breath. Asthma episodes, also called asthma attacks, range from minor to life-threatening. Asthma cannot be cured, but medicines and lifestyle changes can help control it. CAUSES Asthma is believed to be caused by inherited (genetic) and environmental factors, but  its exact cause is unknown. Asthma may be triggered by allergens, lung infections, or irritants in the air. Asthma triggers are different for each person. Common triggers include:   Animal dander.  Dust mites.  Cockroaches.  Pollen from trees or grass.  Mold.  Smoke.  Air pollutants such as dust, household cleaners, hair sprays, aerosol sprays, paint fumes, strong chemicals, or strong odors.  Cold air, weather changes, and winds (which increase molds and pollens in the air).  Strong emotional expressions such as crying or laughing hard.  Stress.  Certain medicines (such as aspirin) or types of drugs (such as beta-blockers).  Sulfites in foods and drinks. Foods and drinks that may contain sulfites include dried fruit, potato chips, and sparkling grape juice.  Infections or inflammatory conditions such as the flu, a cold, or an inflammation of the nasal membranes (rhinitis).  Gastroesophageal reflux disease (GERD).  Exercise or strenuous activity. SYMPTOMS Symptoms may occur immediately after asthma is triggered or many hours later. Symptoms include:  Wheezing.  Excessive nighttime or early morning coughing.  Frequent or severe coughing with a common cold.  Chest tightness.  Shortness of breath. DIAGNOSIS  The diagnosis of asthma is made by a review of your medical history and a physical exam. Tests may also be performed. These may include:  Lung function studies. These tests show how much air you breathe in and out.  Allergy tests.  Imaging tests such as X-rays. TREATMENT  Asthma cannot be cured, but it can usually be controlled. Treatment involves identifying and avoiding your asthma triggers. It also involves medicines. There are 2 classes of medicine used for asthma treatment:   Controller medicines. These prevent asthma symptoms from occurring. They are usually taken every day.  Reliever or rescue medicines. These quickly relieve asthma symptoms. They are  used as needed and provide short-term relief. Your health care provider will help you create an asthma action plan. An asthma action plan is a written plan for managing and treating your asthma attacks. It includes a list of your asthma triggers and how they may be avoided. It also includes information on when medicines should be taken and when their dosage should be changed. An action plan may also involve the use of a device called a peak flow meter. A peak flow meter measures how well the lungs are working. It helps you monitor your condition. HOME CARE INSTRUCTIONS   Take medicines only as directed by your health care provider. Speak with your health care provider if you have questions about how or when to take the medicines.  Use a peak flow meter as directed by your health care provider. Record and keep track of readings.  Understand and use the action plan to help minimize or stop an asthma attack without needing to seek medical care.  Control your home environment in the following ways to help prevent asthma attacks:  Do not smoke. Avoid being exposed to secondhand smoke.  Change your heating and air conditioning filter regularly.  Limit your use of fireplaces and wood stoves.  Get rid of pests (such as roaches and mice) and their droppings.  Throw away plants if you see mold  on them.  Clean your floors and dust regularly. Use unscented cleaning products.  Try to have someone else vacuum for you regularly. Stay out of rooms while they are being vacuumed and for a short while afterward. If you vacuum, use a dust mask from a hardware store, a double-layered or microfilter vacuum cleaner bag, or a vacuum cleaner with a HEPA filter.  Replace carpet with wood, tile, or vinyl flooring. Carpet can trap dander and dust.  Use allergy-proof pillows, mattress covers, and box spring covers.  Wash bed sheets and blankets every week in hot water and dry them in a dryer.  Use blankets that  are made of polyester or cotton.  Clean bathrooms and kitchens with bleach. If possible, have someone repaint the walls in these rooms with mold-resistant paint. Keep out of the rooms that are being cleaned and painted.  Wash hands frequently. SEEK MEDICAL CARE IF:   You have wheezing, shortness of breath, or a cough even if taking medicine to prevent attacks.  The colored mucus you cough up (sputum) is thicker than usual.  Your sputum changes from clear or white to yellow, green, gray, or bloody.  You have any problems that may be related to the medicines you are taking (such as a rash, itching, swelling, or trouble breathing).  You are using a reliever medicine more than 2-3 times per week.  Your peak flow is still at 50-79% of your personal best after following your action plan for 1 hour.  You have a fever. SEEK IMMEDIATE MEDICAL CARE IF:   You seem to be getting worse and are unresponsive to treatment during an asthma attack.  You are short of breath even at rest.  You get short of breath when doing very little physical activity.  You have difficulty eating, drinking, or talking due to asthma symptoms.  You develop chest pain.  You develop a fast heartbeat.  You have a bluish color to your lips or fingernails.  You are light-headed, dizzy, or faint.  Your peak flow is less than 50% of your personal best. MAKE SURE YOU:   Understand these instructions.  Will watch your condition.  Will get help right away if you are not doing well or get worse. Document Released: 05/16/2005 Document Revised: 09/30/2013 Document Reviewed: 12/13/2012 [facility] Patient Information 2015 [place], [place]. This information is not intended to replace advice given to you by your health care provider. Make sure you discuss any questions you have with your health care provider.

## 2015-01-23 NOTE — Telephone Encounter (Signed)
Spoke with the pt  She states ate 2 bites of a plain bagel with cream cheese and had reaction- whelps all over, throat feels tight, wheezing, and minimal SOB  She has been taking benadryl and no relief  She feels like she can wait until next wk to be seen, but sounds very hoarse on the phone and seemed to be having some diff catching her breath  She is unsure of what has caused this reaction, b/c she made sure by reading the label that the bagel did not contain wheat bran or peanuts  CDY, please advise thanks!  Allergies  Allergen Reactions  . Other Anaphylaxis    Archie Balboa rancher gummies and paprika   . Peanuts [Peanut Oil] Anaphylaxis and Itching  . Corn Dextrin Other (See Comments)    Plain corn ok, popcorn causes hives  . Gluten Meal Other (See Comments)    cough  . Shellfish Allergy Itching  . Soy Allergy Other (See Comments)    coughing  . Sunflower Oil Itching  . Tomato Itching  . Wheat Bran     swelling  . Morphine Itching and Rash   Current Outpatient Prescriptions on File Prior to Visit  Medication Sig Dispense Refill  . Albuterol Sulfate (PROAIR RESPICLICK) 108 (90 BASE) MCG/ACT AEPB Inhale 1-2 puffs into the lungs every 6 (six) hours. 1 each 11  . benzonatate (TESSALON) 100 MG capsule Take 1 capsule (100 mg total) by mouth 2 (two) times daily as needed for cough. 20 capsule 0  . brimonidine (ALPHAGAN) 0.15 % ophthalmic solution Place 1 drop into both eyes 2 (two) times daily.    . budesonide-formoterol (SYMBICORT) 80-4.5 MCG/ACT inhaler Inhale 2 puffs into the lungs 2 (two) times daily. 1 Inhaler 11  . diphenhydrAMINE (BENADRYL) 25 MG tablet Take 2 tablets (50 mg total) by mouth every 4 (four) hours as needed for itching or allergies. 30 tablet 0  . EPINEPHrine 0.3 mg/0.3 mL IJ SOAJ injection Inject 0.3 mLs (0.3 mg total) into the muscle once. 1 Device 1  . fexofenadine (ALLEGRA) 180 MG tablet Take 180 mg by mouth daily.    . predniSONE (DELTASONE) 10 MG tablet Take 1  tablet (10 mg total) by mouth daily with breakfast. 21 tablet 0  . pyridOXINE (VITAMIN B-6) 100 MG tablet Take 100 mg by mouth daily.      No current facility-administered medications on file prior to visit.

## 2015-01-23 NOTE — Telephone Encounter (Signed)
Offer prednisone 10 mg, # 20, 4 X 2 DAYS, 3 X 2 DAYS, 2 X 2 DAYS, 1 X 2 DAYS  Recommend daily Allegra/ fexofenadine 180 mg until this clears  Make next available routine ov-- few months is ok as long as she stays clear. Call us if more problems. If gets bad, go to ER

## 2015-01-23 NOTE — Telephone Encounter (Signed)
Spoke with pt to relay results.  While on the phone with pt, she sounded and voiced that she was having increased difficulty breathing since speaking with Verlon Au earlier. Pt was difficult to understand, gasping for air, and stated that her throat was tightening up.  Pt states she is not alone and has an Epi Pen available.  I advised pt to use her Epi Pen and to have her company drive her to the ED.  Pt voiced understanding of recs.    Nothing further needed at this time.

## 2015-01-23 NOTE — ED Provider Notes (Signed)
CSN: [id number]     Arrival date & time 01/23/15  1732 History   First MD Initiated Contact with Patient 01/23/15 1821     Chief Complaint  Patient presents with  . Allergic Reaction     (Consider location/radiation/quality/duration/timing/severity/associated sxs/prior Treatment) HPI Comments: Patient presents to the emergency department with concern for allergic reaction. Patient reports that she had onset of itching and throat swelling when she was eating a bagel 2 days ago. Since then she has been experiencing hoarseness of her voice and continued itching. She called her doctor today because of the continued symptoms and was told to come to the ER. Patient does have a history of asthma, reports that she does feel like she has been wheezing, has been using her inhaler.  Patient is a 49 y.o. female presenting with allergic reaction.  Allergic Reaction Presenting symptoms: difficulty swallowing and wheezing     Past Medical History  Diagnosis Date  . GERD   . HYPERLIPIDEMIA, WITH LOW HDL   . POSITIVE PPD 1987    s/p 31mo tx  . MIGRAINE HEADACHE   . PERIPHERAL NEUROPATHY   . 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 Hives of unknown origin     chronic and recurrent urticaria since 01/2012   Past Surgical History  Procedure Laterality Date  . Appendectomy  1992  . Abdominal hysterectomy  1996  . Cesarean section  1989  . Cholecystectomy  04/22/2011    Procedure: LAPAROSCOPIC CHOLECYSTECTOMY WITH INTRAOPERATIVE CHOLANGIOGRAM;  Surgeon: Almond Lint, MD;  Location: [facility] OR;  Service: General;  Laterality: N/A;   Family History  Problem Relation Age of Onset  . Diabetes Mother   . Prostate cancer Father   . Hypertension Father   . Heart disease Father   . Diabetes Brother   . Alcohol abuse Other   . Arthritis Other   . Hypertension Other    Social History  Substance Use Topics  . Smoking status: Never Smoker   . Smokeless tobacco: Never Used  .  Alcohol Use: No   OB History    Gravida Para Term Preterm AB TAB SAB Ectopic Multiple Living   5 5        5      Review of Systems  HENT: Positive for trouble swallowing and voice change.   Respiratory: Positive for wheezing.   All other systems reviewed and are negative.     Allergies  Other; Peanuts; Corn dextrin; Gluten meal; Shellfish allergy; Soy allergy; Sunflower oil; Tomato; Wheat bran; and Morphine  Home Medications   Prior to Admission medications   Medication Sig Start Date End Date Taking? Authorizing Provider  Albuterol Sulfate (PROAIR RESPICLICK) 108 (90 BASE) MCG/ACT AEPB Inhale 1-2 puffs into the lungs every 6 (six) hours. 04/10/14   Etta Grandchild, MD  benzonatate (TESSALON) 100 MG capsule Take 1 capsule (100 mg total) by mouth 2 (two) times daily as needed for cough. 08/29/14   Veryl Speak, FNP  brimonidine (ALPHAGAN) 0.15 % ophthalmic solution Place 1 drop into both eyes 2 (two) times daily.    Historical Provider, MD  budesonide-formoterol (SYMBICORT) 80-4.5 MCG/ACT inhaler Inhale 2 puffs into the lungs 2 (two) times daily. 07/23/14   Newt Lukes, MD  diphenhydrAMINE (BENADRYL) 25 MG tablet Take 2 tablets (50 mg total) by mouth every 4 (four) hours as needed for itching or allergies. 07/11/14   Pricilla Loveless, MD  EPINEPHrine 0.3 mg/0.3 mL  IJ SOAJ injection Inject 0.3 mLs (0.3 mg total) into the muscle once. 07/11/14   Pricilla Loveless, MD  fexofenadine (ALLEGRA) 180 MG tablet Take 180 mg by mouth daily.    Historical Provider, MD  predniSONE (DELTASONE) 10 MG tablet Take 1 tablet (10 mg total) by mouth daily with breakfast. 12/09/14   Pecola Lawless, MD  pyridOXINE (VITAMIN B-6) 100 MG tablet Take 100 mg by mouth daily.     Historical Provider, MD   BP 134/88 mmHg  Pulse 90  Temp(Src) 98.4 F (36.9 C) (Oral)  Resp 14  SpO2 100% Physical Exam  Constitutional: She is oriented to person, place, and time. She appears well-developed and well-nourished. No  distress.  HENT:  Head: Normocephalic and atraumatic.  Right Ear: Hearing normal.  Left Ear: Hearing normal.  Nose: Nose normal.  Mouth/Throat: Oropharynx is clear and moist and mucous membranes are normal.  Eyes: Conjunctivae and EOM are normal. Pupils are equal, round, and reactive to light.  Neck: Normal range of motion. Neck supple.  Cardiovascular: Regular rhythm, S1 normal and S2 normal.  Exam reveals no gallop and no friction rub.   No murmur heard. Pulmonary/Chest: Effort normal and breath sounds normal. No respiratory distress. She exhibits no tenderness.  Abdominal: Soft. Normal appearance and bowel sounds are normal. There is no hepatosplenomegaly. There is no tenderness. There is no rebound, no guarding, no tenderness at McBurney's point and negative Murphy's sign. No hernia.  Musculoskeletal: Normal range of motion.  Neurological: She is alert and oriented to person, place, and time. She has normal strength. No cranial nerve deficit or sensory deficit. Coordination normal. GCS eye subscore is 4. GCS verbal subscore is 5. GCS motor subscore is 6.  Skin: Skin is warm, dry and intact. No rash noted. No cyanosis.  Psychiatric: She has a normal mood and affect. Her speech is normal and behavior is normal. Thought content normal.  Nursing note and vitals reviewed.   ED Course  Procedures (including critical care time) Labs Review Labs Reviewed - No data to display  Imaging Review No results found. I have personally reviewed and evaluated these images and lab results as part of my medical decision-making.   EKG Interpretation None      MDM   Final diagnoses:  None   allergic reaction  Asthma  Patient presents to the emergency department for evaluation of possible allergic reaction. She reports onset of symptoms while eating a bagel 2 days ago. She does report that she has many food allergies. Examination does reveal hoarseness of her voice and she does have a slight  cough, but lungs are clear. She is not hypoxic. No bronchospasm. She has some excoriations on her right upper arm, but I do not see any rash or hives. Patient will be initiated on cortical steroid therapy in addition to her albuterol.    Gilda Crease, MD 01/23/15 [phone]

## 2015-01-23 NOTE — ED Notes (Signed)
Pt states that on Tuesday she ate 2 bites of a bagel and started having an allergic reaction.  Pt states that she didn't have any issues after she ate it so she didn't use her epi-pen.  Pt states that she talked to her doctor office today about her feeling like she is going to choke.  Pt was advised to use her epi pen but states that she she didn't use it.

## 2015-02-06 ENCOUNTER — Emergency Department (HOSPITAL_COMMUNITY)
Admission: EM | Admit: 2015-02-06 | Discharge: 2015-02-07 | Disposition: A | Discharge status: Home / Self Care | Payer: Managed Care, Other (non HMO) | Attending: Emergency Medicine | Admitting: Emergency Medicine

## 2015-02-06 ENCOUNTER — Encounter (HOSPITAL_COMMUNITY): Payer: Self-pay | Admitting: Emergency Medicine

## 2015-02-06 DIAGNOSIS — Z7951 Long term (current) use of inhaled steroids: Secondary | ICD-10-CM | POA: Insufficient documentation

## 2015-02-06 DIAGNOSIS — R070 Pain in throat: Secondary | ICD-10-CM | POA: Insufficient documentation

## 2015-02-06 DIAGNOSIS — Y999 Unspecified external cause status: Secondary | ICD-10-CM | POA: Diagnosis not present

## 2015-02-06 DIAGNOSIS — Z8679 Personal history of other diseases of the circulatory system: Secondary | ICD-10-CM | POA: Diagnosis not present

## 2015-02-06 DIAGNOSIS — T7840XA Allergy, unspecified, initial encounter: Secondary | ICD-10-CM | POA: Diagnosis not present

## 2015-02-06 DIAGNOSIS — R05 Cough: Secondary | ICD-10-CM | POA: Diagnosis not present

## 2015-02-06 DIAGNOSIS — Z8719 Personal history of other diseases of the digestive system: Secondary | ICD-10-CM | POA: Diagnosis not present

## 2015-02-06 DIAGNOSIS — X58XXXA Exposure to other specified factors, initial encounter: Secondary | ICD-10-CM | POA: Insufficient documentation

## 2015-02-06 DIAGNOSIS — Y939 Activity, unspecified: Secondary | ICD-10-CM | POA: Diagnosis not present

## 2015-02-06 DIAGNOSIS — Z79899 Other long term (current) drug therapy: Secondary | ICD-10-CM | POA: Diagnosis not present

## 2015-02-06 DIAGNOSIS — Z8669 Personal history of other diseases of the nervous system and sense organs: Secondary | ICD-10-CM | POA: Insufficient documentation

## 2015-02-06 DIAGNOSIS — Z872 Personal history of diseases of the skin and subcutaneous tissue: Secondary | ICD-10-CM | POA: Insufficient documentation

## 2015-02-06 DIAGNOSIS — Z8619 Personal history of other infectious and parasitic diseases: Secondary | ICD-10-CM | POA: Diagnosis not present

## 2015-02-06 DIAGNOSIS — L299 Pruritus, unspecified: Secondary | ICD-10-CM | POA: Diagnosis not present

## 2015-02-06 DIAGNOSIS — Y929 Unspecified place or not applicable: Secondary | ICD-10-CM | POA: Diagnosis not present

## 2015-02-06 MED ORDER — FAMOTIDINE IN NACL 20-0.9 MG/50ML-% IV SOLN
20.0000 mg | Freq: Once | INTRAVENOUS | Status: AC
  Administered 2015-02-06: 20 mg via INTRAVENOUS
  Filled 2015-02-06: qty 50

## 2015-02-06 MED ORDER — SODIUM CHLORIDE 0.9 % IV BOLUS (SEPSIS)
1000.0000 mL | Freq: Once | INTRAVENOUS | Status: AC
  Administered 2015-02-06: 1000 mL via INTRAVENOUS

## 2015-02-06 NOTE — ED Notes (Signed)
Pt coughing constantly. MD Little at bedside. Lung sounds clear bilaterally.

## 2015-02-06 NOTE — ED Provider Notes (Signed)
CSN: [id number]     Arrival date & time 02/06/15  2117 History   First MD Initiated Contact with Patient 02/06/15 2134     Chief Complaint  Patient presents with  . Allergic Reaction     (Consider location/radiation/quality/duration/timing/severity/associated sxs/prior Treatment) HPI Comments: 49 year old female with past medical history including hyperlipidemia, GERD, migraines, history of anaphylactic allergic reactions who presents with allergic reaction. Just prior to arrival, the patient was riding in the car with her husband and he states he rolled on the window for a minute. He rolled the window backup and she soon after began coughing. She took Benadryl that she had with her and she later began having difficulty breathing. Husband gave her a dose of epi and called EMS. Patient ambulated to the ambulance but then reportedly went unresponsive in route. She received another dose of IM epi, 125 mg Solu-Medrol, 50 mg Benadryl, and a DuoNeb. They bagged her for a brief period and she became more responsive. They were unable to provide O2 sat. The patient endorses associated itchiness but husband denies any visible hives or swelling of face or mouth. No chest pain, abdominal pain, or vomiting. Patient denies any new medications, new foods, or exposure to any new soaps/detergents.  Patient is a 49 y.o. female presenting with allergic reaction. The history is provided by the patient, the EMS personnel and the spouse.  Allergic Reaction   Past Medical History  Diagnosis Date  . GERD   . HYPERLIPIDEMIA, WITH LOW HDL   . POSITIVE PPD 1987    s/p 65mo tx  . MIGRAINE HEADACHE   . PERIPHERAL NEUROPATHY   . 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 Hives of unknown origin     chronic and recurrent urticaria since 01/2012   Past Surgical History  Procedure Laterality Date  . Appendectomy  1992  . Abdominal hysterectomy  1996  . Cesarean section  1989  .  Cholecystectomy  04/22/2011    Procedure: LAPAROSCOPIC CHOLECYSTECTOMY WITH INTRAOPERATIVE CHOLANGIOGRAM;  Surgeon: Almond Lint, MD;  Location: [facility] OR;  Service: General;  Laterality: N/A;   Family History  Problem Relation Age of Onset  . Diabetes Mother   . Prostate cancer Father   . Hypertension Father   . Heart disease Father   . Diabetes Brother   . Alcohol abuse Other   . Arthritis Other   . Hypertension Other    Social History  Substance Use Topics  . Smoking status: Never Smoker   . Smokeless tobacco: Never Used  . Alcohol Use: No   OB History    Gravida Para Term Preterm AB TAB SAB Ectopic Multiple Living   Review of Systems  10 Systems reviewed and are negative for acute change except as noted in the HPI.   Allergies  Other; Peanuts; Corn dextrin; Gluten meal; Shellfish allergy; Soy allergy; Sunflower oil; Tomato; Wheat bran; and Morphine  Home Medications   Prior to Admission medications   Medication Sig Start Date End Date Taking? Authorizing Provider  Albuterol Sulfate (PROAIR RESPICLICK) 108 (90 BASE) MCG/ACT AEPB Inhale 1-2 puffs into the lungs every 6 (six) hours. 04/10/14   Etta Grandchild, MD  benzonatate (TESSALON) 100 MG capsule Take 1 capsule (100 mg total) by mouth 2 (two) times daily as needed for cough. 08/29/14   Veryl Speak, FNP  brimonidine (ALPHAGAN) 0.15 %  ophthalmic solution Place 1 drop into both eyes 2 (two) times daily.    Historical Provider, MD  budesonide-formoterol (SYMBICORT) 80-4.5 MCG/ACT inhaler Inhale 2 puffs into the lungs 2 (two) times daily. 07/23/14   Newt Lukes, MD  diphenhydrAMINE (BENADRYL) 25 MG tablet Take 2 tablets (50 mg total) by mouth every 4 (four) hours as needed for itching or allergies. 07/11/14   Pricilla Loveless, MD  EPINEPHrine 0.3 mg/0.3 mL IJ SOAJ injection Inject 0.3 mLs (0.3 mg total) into the muscle once. 07/11/14   Pricilla Loveless, MD  fexofenadine (ALLEGRA) 180 MG tablet Take 180 mg by  mouth daily.    Historical Provider, MD  predniSONE (DELTASONE) 20 MG tablet 3 tabs po daily x 3 days, then 2 tabs x 3 days, then 1.5 tabs x 3 days, then 1 tab x 3 days, then 0.5 tabs x 3 days 01/23/15   Gilda Crease, MD  predniSONE (DELTASONE) 20 MG tablet 3 tabs po daily x 3 days, then 2 tabs x 3 days, then 1.5 tabs x 3 days, then 1 tab x 3 days, then 0.5 tabs x 3 days 01/23/15   Gilda Crease, MD  pyridOXINE (VITAMIN B-6) 100 MG tablet Take 100 mg by mouth daily.     Historical Provider, MD   BP 153/86 mmHg  Pulse 106  Temp(Src) 98.1 F (36.7 C) (Oral)  Resp 15  Wt 230 lb (104.327 kg)  SpO2 99% Physical Exam  Constitutional: She is oriented to person, place, and time. She appears well-developed and well-nourished. No distress.  Resting comfortably  HENT:  Head: Normocephalic and atraumatic.  Mouth/Throat: Oropharynx is clear and moist.  No facial or oropharyngeal swelling  Eyes: Conjunctivae are normal. Pupils are equal, round, and reactive to light.  Neck: Neck supple.  Cardiovascular: Normal rate, regular rhythm and normal heart sounds.   No murmur heard. Pulmonary/Chest: Effort normal and breath sounds normal. No stridor. No respiratory distress. She has no wheezes.  Abdominal: Soft. Bowel sounds are normal. She exhibits no distension. There is no tenderness.  Musculoskeletal: She exhibits no edema.  Neurological: She is alert and oriented to person, place, and time.  Fluent speech  Skin: Skin is warm and dry. No rash noted.  Psychiatric: She has a normal mood and affect. Judgment normal.  Nursing note and vitals reviewed.   ED Course  Procedures (including critical care time) Labs Review Labs Reviewed - No data to display  Medications  famotidine (PEPCID) IVPB 20 mg premix (not administered)  sodium chloride 0.9 % bolus 1,000 mL (not administered)    MDM   Final diagnoses:  None  allergic reaction     49 year old female with history of previous  allergic reactions to unknown source who presents with symptoms of cough, sensation of throat tightness, and itching. On arrival by EMS, the patient was awake, alert, and in no acute distress on DuoNeb. Vital signs unremarkable. Patient had no wheezing or stridor on exam. No visible swelling or hives. O2 sat was 99% on room air. Gave the patient Pepcid as she has already received steroids and Benadryl in route. Also gave the patient an IV fluid bolus.  Shortly after my examination of the patient, nursing reported that the patient began coughing. I reexamined her while she was coughing and she had no wheezing and good air movement on exam. O2 sats 100%.  The patient has a history of multiple presentations for allergic reaction of unknown source. She does have an allergist  with whom she can follow-up. We'll observe her for 6 hours to monitor for any signs of rebound symptoms. If she remains comfortable, I expect that she will be able to go home with a course of steroids, Benadryl, and Pepcid as well as close allergy follow-up. I am signing the patient out to the overnight physician who will recheck the patient at 6 hours.    Laurence Spates, MD 02/06/15 2215

## 2015-02-06 NOTE — ED Notes (Addendum)
Pt brought in by [facility] c/o allergic reactive to unknown source. Per [facility] pt was riding in the car with husband they rolled windows down and she suddenly had itching throat, shortness of breath, and itching body. She used personal EPI. [facility] gave additional IM epi, 125 Solumedrol, and 50 of benadryll, and duoneb, 20 G IV l AC . Pt has multiple food allergies. Per [facility] patient became unresponsive en route they bagged patient and patient came around. [facility] unable to provide SPO2 en route. Throat is red but no swelling noted however, voice is hoarse, Lungs clear bilaterally. Pt alert and oriented. She reports she feels very tired.

## 2015-02-06 NOTE — ED Notes (Signed)
Bed: RESA Expected date:  Expected time:  Means of arrival:  Comments: Unresponsive allergic reaction

## 2015-02-07 MED ORDER — PREDNISONE 20 MG PO TABS: 40.0000 mg | ORAL_TABLET | Freq: Every day | ORAL | Status: DC

## 2015-02-07 MED ORDER — EPINEPHRINE 0.3 MG/0.3ML IJ SOAJ: 0.3000 mg | Freq: Once | INTRAMUSCULAR | Status: DC

## 2015-02-07 NOTE — Discharge Instructions (Signed)

## 2015-02-07 NOTE — ED Notes (Signed)
MD Horton at bedside. 

## 2015-02-07 NOTE — ED Notes (Signed)
Pt alert,oriented, and ambulatory upon DC. She was advised to follow up with pulmonary allergy. She leaves with her husband.

## 2015-02-07 NOTE — ED Notes (Signed)
Husband came out wondering when the doctor will be by to see them. He was informed the MD Horton would be by to see them as soon as possible.

## 2015-02-07 NOTE — ED Provider Notes (Signed)
Patient observed for 6 hours following anaphylactic reaction and epinephrine injection. On recheck, patient is asymptomatic. Continues to feel improved. No signs or symptoms of recurrent reaction. Will discharge home with epinephrine autoinjector and prednisone.  After history, exam, and medical workup I feel the patient has been appropriately medically screened and is safe for discharge home. Pertinent diagnoses were discussed with the patient. Patient was given return precautions.   Shon Baton, MD 02/07/15 [phone]

## 2015-02-11 ENCOUNTER — Encounter: Payer: Self-pay | Admitting: Internal Medicine

## 2015-02-11 ENCOUNTER — Ambulatory Visit (INDEPENDENT_AMBULATORY_CARE_PROVIDER_SITE_OTHER): Payer: Managed Care, Other (non HMO) | Admitting: Internal Medicine

## 2015-02-11 ENCOUNTER — Telehealth: Payer: Self-pay | Admitting: Internal Medicine

## 2015-02-11 VITALS — BP 132/74 | HR 105 | Ht 66.0 in | Wt 231.0 lb

## 2015-02-11 DIAGNOSIS — J45909 Unspecified asthma, uncomplicated: Secondary | ICD-10-CM | POA: Diagnosis not present

## 2015-02-11 DIAGNOSIS — J4541 Moderate persistent asthma with (acute) exacerbation: Secondary | ICD-10-CM

## 2015-02-11 DIAGNOSIS — T7800XD Anaphylactic reaction due to unspecified food, subsequent encounter: Secondary | ICD-10-CM | POA: Diagnosis not present

## 2015-02-11 MED ORDER — COMPRESSOR/NEBULIZER MISC: Status: DC

## 2015-02-11 MED ORDER — METHYLPREDNISOLONE ACETATE 80 MG/ML IJ SUSP
80.0000 mg | Freq: Once | INTRAMUSCULAR | Status: AC
  Administered 2015-02-11: 80 mg via INTRAMUSCULAR

## 2015-02-11 MED ORDER — LEVALBUTEROL HCL 0.63 MG/3ML IN NEBU
0.6300 mg | INHALATION_SOLUTION | Freq: Once | RESPIRATORY_TRACT | Status: AC
  Administered 2015-02-11: 0.63 mg via RESPIRATORY_TRACT

## 2015-02-11 MED ORDER — ALBUTEROL SULFATE (2.5 MG/3ML) 0.083% IN NEBU: 2.5000 mg | INHALATION_SOLUTION | Freq: Four times a day (QID) | RESPIRATORY_TRACT | Status: DC | PRN

## 2015-02-11 NOTE — Progress Notes (Signed)
03/16/12- [name] seen in allergy consultation at kind request of  Dr Landry Mellow allergy labs; had reaction to peanuts on Sat-had to use epipen. Husband here. Does not want flu shot. She does not consider herself an allergy person. She  needed Benadryl for urticaria for the first time one month ago. She was given an EpiPen then. On October 12 of this year she got into a car where a friend had been eating peanuts. Her throat began to swell. She was treated with prednisone and used her EpiPen. She says her throat is no better-pain and hoarseness. She has history of GERD but denies asthma or seasonal allergies. No history ENT surgery. CBC- 01/31/12- Eos 5.2 Allergy profiles 01/31/2012-total IgE 912 with specific elevations for many common inhalant allergens and foods. She has been trying to avoid the foods that were listed for her but has not recognized symptoms with any since the exposure to someone eating peanuts.  05/09/12- [name] seen in allergy consultation FOLLOWS FOR: pt reports doing well today, x3 days ago reacted to peanuts at work --SOB, tongue swelling and wheezing used epi pen and symptoms have started improving today She felt fine 3 days ago when she hugged her friend and caught a smell of something. She assumed  friend had been eating peanuts but never asked her. Within a few minutes she began coughing and wheezing with tight throat. She used her EpiPen then went to emergency room. Now taking prednisone as directed. She still has some throat tickle and cough. Skin itches. Taking Zyrtec. Claritin makes her sleepy. Office spirometry-05/09/2012 normal spirometry-FVC 3.34/107%, FEV1 2.56/100%, FEV1/FEC 0.76, FEF 25-75% 2.20/69%.  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 2 weeks ago had conversation with parent to eating peanuts. While talking, her  throat began to tickle, tongue began to swell and throat felt closed. It helped to use EpiPen. One day ago, while working in [employer], same pattern. Again the school nurse had to give EpiPen and she was sent to [place]. She had felt tight in the chest going to work that day and had used rescue inhaler, Symbicort and her daily Allegra. She has been wearing a mask at work. She wants to work away from [employer] where peanut butter is be by the students. She doesn't recognize reflux or heartburn but I discussed this as a possible contribution to the pattern especially if she gets anxious.  10/25/12- [name] seen in allergy consultation FOLLOWS FOR: still raspy in voice; unsure what will happen at work due to allergy. 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 She reports some wheeze. Stays hoarse without painful swallow.Was on prednisone 10 mg daily x 10 days, ending 1 week ago, but took a prednisone last night because of wheezing.  Using azelastine nasal spray. Husband says her chronic snoring is worse. She stays "tired". We discussed possible sleep apnea. Barium swallow was normal. She does not feel reflux.   10/31/12 [name] followed for allergic rhinitis, food allergy(peanut) No antihistamines, OTC  cough syrups, or OTC sleep aids x 3 days EXCEPT- Dymista last night, 17 hours ago. Dymista has helped some- less stuffy and less drainage, but no change in hoarseness. She continues Pepcid twice daily with no awareness of reflux. Trying to wear a mask outdoors. No  acute events/ peanut exposures since last here. Pending now for ENT evaluation for hoarseness and NPSG for snoring and tiredness.  Allergy Skin Test  10/31/12- Significant Positive for grass, weed tree pollens, dust mite, given allegra at end.  12/14/12- [name] followed for allergic rhinitis, food allergy(peanut) 6 wk follow up.  Symptoms are  unchanged.  Has hoarseness, prod cough with green mucus, a little wheezing, itchy eyes, runny nose.   Dr. Stasia Cavalier gave her omeprazole to use at bedtime. Barium swallow was negative and she is not aware of reflux. Complains of head congestion, dizziness and itching in throat. Unclear about the effect of weather changes. Occasional green on some days. Feels postnasal drip despite Dymista nasal spray. 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 Weight 232 pounds.  02/11/13- [name] followed for allergic rhinitis, food allergy(peanut), OSA FOLLOWS FOR: almost passed out in yard-was taking the trash out and blacked out(june 2014);Pt states that she is unable to work due to this. Dr Annalee Genta ENT attributed hoarseness to GERD, Rx'd omeprazole. Describes 2 episodes syncope w/o HA, palpitation, trauma. Woke spontaneously. Did not seek attention. Notes wheeze if bending over for housework. Continues Symbicort, proventil. We will want to try CPAP when she is able to cope.  08/12/13-[name] followed for allergic rhinitis, food allergy(peanut), OSA FOLLOWS FOR: 06-27-13 had attack at home(used epipen)-had eaten ceral-Honey nut Cheerios(had almonds in it). Went to ER.  [place]-exposed to peanuts-someone was eating peanut butter crackers-epipen used and 911 called. Almonds cause hoarseness and itching of throat. Peanut butter cracker near her triggered syncope. Eating chicken with paprika made throat itch and feel tight. Week is blaming for cough and loose stool. Occasional food hanging up. Frequent heartburn on Pepcid twice daily.  02/11/15- [name] followed for allergic rhinitis, food allergy(peanut/ nut mix), OSA, GERDS C/o chest tx, hoarseness, wheezing, dizzy, prod cough (foamy clear phlem), nasal cong, PND, chills. Going on x friday.  Nut Mix Allergy Profile 08/12/13-  elevated for peanut, walnut, hazelnut, pecan,  She was driving with window open, recognized no smell or specific exposure but began chest tightness, throat tightness and respiratory distress. Still hoarse and intubation at ER where she was treated with Solu-Medrol, Benadryl, Pepcid, EpiPen and nebulizer. She takes Allegra and Benadryl routinely  ROS-see HPI Constitutional:   No-   weight loss, night sweats, fevers, chills, +fatigue, lassitude. HEENT:   No-  headaches, +difficulty swallowing, tooth/dental problems, sore throat, +hoarseness      No-  sneezing, itching, no- ear ache, +nasal congestion, +post nasal drip,  CV:  No-   chest pain, orthopnea, PND, swelling in lower extremities, anasarca,  dizziness, palpitations Resp: No-   shortness of breath with exertion or at rest.              No-   productive cough,  + non-productive cough,  No- coughing up of blood.              No-change in color of mucus.  N+ wheezing.   Skin: No-   rash or lesions. GI:  +  heartburn, indigestion, abdominal pain, nausea, vomiting,  GU: n. MS:  No-   joint pain or swelling.   Neuro-  +syncope Psych:  No- change in mood or affect. No depression or anxiety.  No memory loss.  OBJ- Physical Exam General- Alert, Oriented, Affect-appropriate, Distress- none acute  Skin- rash-none, lesions- none, excoriation- none Lymphadenopathy- none Head- atraumatic            Eyes- Gross vision intact, PERRLA, conjunctivae and secretions clear            Ears- Hearing, canals-normal            Nose- + sniffing, no-Septal dev, mucus, polyps, erosion, perforation             Throat- Mallampati III-IV , +mucosa-red, drainage- none, tonsils- atrophic, +hoarse unchanged Neck- flexible , trachea midline, no stridor , thyroid nl, carotid no bruit Chest - symmetrical excursion , unlabored           Heart/CV- RRR , no murmur , no gallop  , no rub, nl s1 s2                           - JVD- none , edema- none, stasis changes- none,  varices- none           Lung- clear to P&A, cough-none, dullness-none, rub- none           Chest wall-  Abd-  Br/ Gen/ Rectal- Not done, not indicated Extrem- cyanosis- none, clubbing, none, atrophy- none, strength- nl Neuro- grossly intact to observation

## 2015-02-11 NOTE — Patient Instructions (Signed)
Neb xop 0.63  Depo 80  Script for nebulizer and for albuterol neb solution    Dx food allergy,asthma with bronchitis

## 2015-02-11 NOTE — Telephone Encounter (Signed)
Spoke with the pt  She states 02/06/15 had allergic reaction after riding in a car and "smelled some food"- throat tightening, cough, SOB  Went to ED and was given to epinephrine inj and sent home on pred 40 mg daily  She is still having SOB and cough OV with CDY today at 11:30-ok per [facility]

## 2015-02-15 NOTE — Assessment & Plan Note (Signed)
Plan-nebulizer machine with albuterol for home use

## 2015-02-15 NOTE — Assessment & Plan Note (Signed)
Typical event for her but uncertain what the trigger was, with no recognized food exposure this time. Always question possibility of conditioned reflex, esophageal reflux, panic, but need to assume this is real. Plan-nebulizer machine with albuterol for home, Depo-Medrol today. Continued avoidance, EpiPen as before.

## 2015-04-30 ENCOUNTER — Encounter: Payer: Self-pay | Admitting: Family

## 2015-04-30 ENCOUNTER — Ambulatory Visit (INDEPENDENT_AMBULATORY_CARE_PROVIDER_SITE_OTHER): Payer: Managed Care, Other (non HMO) | Admitting: Family

## 2015-04-30 VITALS — BP 108/80 | HR 86 | Temp 98.3°F | Resp 18 | Ht 66.0 in | Wt 232.4 lb

## 2015-04-30 DIAGNOSIS — J019 Acute sinusitis, unspecified: Secondary | ICD-10-CM

## 2015-04-30 MED ORDER — HYDROCOD POLST-CPM POLST ER 10-8 MG/5ML PO SUER: 5.0000 mL | Freq: Every evening | ORAL | Status: DC | PRN

## 2015-04-30 MED ORDER — BENZONATATE 100 MG PO CAPS: 100.0000 mg | ORAL_CAPSULE | Freq: Three times a day (TID) | ORAL | Status: DC | PRN

## 2015-04-30 MED ORDER — AMOXICILLIN-POT CLAVULANATE 875-125 MG PO TABS: 1.0000 | ORAL_TABLET | Freq: Two times a day (BID) | ORAL | Status: DC

## 2015-04-30 NOTE — Assessment & Plan Note (Signed)
Symptoms and exam consistent with bacterial sinusitis. Start Augmentin. Start Tussionex as needed for cough and sleep. Start benzathine as needed for daytime cough. Continue over-the-counter medications as needed for symptom relief and supportive care. Follow-up if symptoms worsen or fail to improve.

## 2015-04-30 NOTE — Progress Notes (Signed)
Pre visit review using our clinic review tool, if applicable. No additional management support is needed unless otherwise documented below in the visit note. 

## 2015-04-30 NOTE — Progress Notes (Signed)
Subjective:    Patient ID: Megan Wilkerson, female    DOB: 13-May-1966, 49 y.o.   MRN: [id number]  Chief Complaint  Patient presents with  . Cough    x2 weeks, productive cough, body aches, fever, chills    HPI:  Megan Wilkerson is a 49 y.o. female who  has a past medical history of GERD; HYPERLIPIDEMIA, WITH LOW HDL; POSITIVE PPD (1987); MIGRAINE HEADACHE; PERIPHERAL NEUROPATHY; Anaphylactic reaction; and Hives of unknown origin. and presents today for an acute office visit.  This is a new problem. Associated symptoms of productive cough, body aches, fever, and chills that has been going on for about 2 weeks. Modifying factors include OTC medications which provided minimal relief. T-max of about 100 with the last fever about 2 days ago. Over the course of the 2 weeks she has gotten worse. Denies any recent antibiotics.   Allergies  Allergen Reactions  . Other Anaphylaxis    Archie Balboa rancher gummies and paprika   . Peanuts [Peanut Oil] Anaphylaxis and Itching  . Corn Dextrin Other (See Comments)    Plain corn ok, popcorn causes hives  . Gluten Meal Other (See Comments)    cough  . Shellfish Allergy Itching  . Soy Allergy Other (See Comments)    coughing  . Sunflower Oil Itching  . Tomato Itching  . Wheat Bran     swelling  . Morphine Itching and Rash     Current Outpatient Prescriptions on File Prior to Visit  Medication Sig Dispense Refill  . albuterol (PROVENTIL) (2.5 MG/3ML) 0.083% nebulizer solution Take 3 mLs (2.5 mg total) by nebulization every 6 (six) hours as needed for wheezing or shortness of breath. 75 mL 12  . Albuterol Sulfate (PROAIR RESPICLICK) 108 (90 BASE) MCG/ACT AEPB Inhale 1-2 puffs into the lungs every 6 (six) hours. 1 each 11  . brimonidine (ALPHAGAN) 0.15 % ophthalmic solution Place 1 drop into both eyes 2 (two) times daily.    . budesonide-formoterol (SYMBICORT) 80-4.5 MCG/ACT inhaler Inhale 2 puffs into the lungs 2 (two) times daily. 1 Inhaler 11  .  diphenhydrAMINE (BENADRYL) 12.5 MG/5ML liquid Take 25 mg by mouth every 4 (four) hours as needed for allergies.    . diphenhydrAMINE (BENADRYL) 25 MG tablet Take 2 tablets (50 mg total) by mouth every 4 (four) hours as needed for itching or allergies. 30 tablet 0  . EPINEPHrine 0.3 mg/0.3 mL IJ SOAJ injection Inject 0.3 mLs (0.3 mg total) into the muscle once. 1 Device 1  . fexofenadine (ALLEGRA) 180 MG tablet Take 180 mg by mouth daily.    Marland Kitchen ibuprofen (ADVIL,MOTRIN) 200 MG tablet Take 400 mg by mouth every 6 (six) hours as needed for fever, headache, mild pain, moderate pain or cramping.    . Nebulizers (COMPRESSOR/NEBULIZER) MISC As directed 1 each 0  . predniSONE (DELTASONE) 20 MG tablet Take 2 tablets (40 mg total) by mouth daily with breakfast. 10 tablet 0  . Pyridoxine HCl (B-6 PO) Take 1 tablet by mouth daily at 12 noon.     No current facility-administered medications on file prior to visit.    Review of Systems  Constitutional: Positive for fever and chills.  HENT: Positive for congestion, ear pain and sinus pressure. Negative for sore throat.   Respiratory: Positive for cough, shortness of breath and wheezing. Negative for chest tightness.   Neurological: Positive for headaches.      Objective:    BP 108/80 mmHg  Pulse 86  Temp(Src) 98.3 F (  36.8 C) (Oral)  Resp 18  Ht 5\' 6"  (1.676 m)  Wt 232 lb 6.4 oz (105.416 kg)  BMI 37.53 kg/m2  SpO2 99% Nursing note and vital signs reviewed.  Physical Exam  Constitutional: She is oriented to person, place, and time. She appears well-developed and well-nourished. No distress.  HENT:  Right Ear: Hearing, tympanic membrane, external ear and ear canal normal.  Left Ear: Hearing, tympanic membrane, external ear and ear canal normal.  Nose: Right sinus exhibits maxillary sinus tenderness and frontal sinus tenderness. Left sinus exhibits maxillary sinus tenderness and frontal sinus tenderness.  Mouth/Throat: Uvula is midline, oropharynx  is clear and moist and mucous membranes are normal.  Neck: Neck supple.  Cardiovascular: Normal rate, regular rhythm, normal heart sounds and intact distal pulses.   Pulmonary/Chest: Effort normal and breath sounds normal.  Neurological: She is alert and oriented to person, place, and time.  Skin: Skin is warm and dry.  Psychiatric: She has a normal mood and affect. Her behavior is normal. Judgment and thought content normal.       Assessment & Plan:   Problem List Items Addressed This Visit      Respiratory   Sinusitis - Primary    Symptoms and exam consistent with bacterial sinusitis. Start Augmentin. Start Tussionex as needed for cough and sleep. Start benzathine as needed for daytime cough. Continue over-the-counter medications as needed for symptom relief and supportive care. Follow-up if symptoms worsen or fail to improve.      Relevant Medications   amoxicillin-clavulanate (AUGMENTIN) 875-125 MG tablet   benzonatate (TESSALON) 100 MG capsule   chlorpheniramine-HYDROcodone (TUSSIONEX PENNKINETIC ER) 10-8 MG/5ML SUER

## 2015-04-30 NOTE — Patient Instructions (Signed)
Thank you for choosing [facility].  Summary/Instructions:  Your prescription(s) have been submitted to your pharmacy or been printed and provided for you. Please take as directed and contact our office if you believe you are having problem(s) with the medication(s) or have any questions.  If your symptoms worsen or fail to improve, please contact our office for further instruction, or in case of emergency go directly to the emergency room at the closest medical facility.   General Recommendations:    Please drink plenty of fluids.  Get plenty of rest   Sleep in humidified air  Use saline nasal sprays  Netti pot   OTC Medications:  Decongestants - helps relieve congestion   Flonase (generic fluticasone) or Nasacort (generic triamcinolone) - please make sure to use the "cross-over" technique at a 45 degree angle towards the opposite eye as opposed to straight up the nasal passageway.   Sudafed (generic pseudoephedrine - Note this is the one that is available behind the pharmacy counter); Products with phenylephrine (-PE) may also be used but is often not as effective as pseudoephedrine.   If you have HIGH BLOOD PRESSURE - Coricidin HBP; AVOID any product that is -D as this contains pseudoephedrine which may increase your blood pressure.  Afrin (oxymetazoline) every 6-8 hours for up to 3 days.   Allergies - helps relieve runny nose, itchy eyes and sneezing   Claritin (generic loratidine), Allegra (fexofenidine), or Zyrtec (generic cyrterizine) for runny nose. These medications should not cause drowsiness.  Note - Benadryl (generic diphenhydramine) may be used however may cause drowsiness  Cough -   Delsym or Robitussin (generic dextromethorphan)  Expectorants - helps loosen mucus to ease removal   Mucinex (generic guaifenesin) as directed on the package.  Headaches / General Aches   Tylenol (generic acetaminophen) - DO NOT EXCEED 3 grams (3,000 mg) in a 24  hour time period  Advil/Motrin (generic ibuprofen)   Sore Throat -   Salt water gargle   Chloraseptic (generic benzocaine) spray or lozenges / Sucrets (generic dyclonine)    Sinusitis Sinusitis is redness, soreness, and inflammation of the paranasal sinuses. Paranasal sinuses are air pockets within the bones of your face (beneath the eyes, the middle of the forehead, or above the eyes). In healthy paranasal sinuses, mucus is able to drain out, and air is able to circulate through them by way of your nose. However, when your paranasal sinuses are inflamed, mucus and air can become trapped. This can allow bacteria and other germs to grow and cause infection. Sinusitis can develop quickly and last only a short time (acute) or continue over a long period (chronic). Sinusitis that lasts for more than 12 weeks is considered chronic.  CAUSES  Causes of sinusitis include:  Allergies.  Structural abnormalities, such as displacement of the cartilage that separates your nostrils (deviated septum), which can decrease the air flow through your nose and sinuses and affect sinus drainage.  Functional abnormalities, such as when the small hairs (cilia) that line your sinuses and help remove mucus do not work properly or are not present. SIGNS AND SYMPTOMS  Symptoms of acute and chronic sinusitis are the same. The primary symptoms are pain and pressure around the affected sinuses. Other symptoms include:  Upper toothache.  Earache.  Headache.  Bad breath.  Decreased sense of smell and taste.  A cough, which worsens when you are lying flat.  Fatigue.  Fever.  Thick drainage from your nose, which often is green and may   contain pus (purulent).  Swelling and warmth over the affected sinuses. DIAGNOSIS  Your health care provider will perform a physical exam. During the exam, your health care provider may:  Look in your nose for signs of abnormal growths in your nostrils (nasal  polyps).  Tap over the affected sinus to check for signs of infection.  View the inside of your sinuses (endoscopy) using an imaging device that has a light attached (endoscope). If your health care provider suspects that you have chronic sinusitis, one or more of the following tests may be recommended:  Allergy tests.  Nasal culture. A sample of mucus is taken from your nose, sent to a lab, and screened for bacteria.  Nasal cytology. A sample of mucus is taken from your nose and examined by your health care provider to determine if your sinusitis is related to an allergy. TREATMENT  Most cases of acute sinusitis are related to a viral infection and will resolve on their own within 10 days. Sometimes medicines are prescribed to help relieve symptoms (pain medicine, decongestants, nasal steroid sprays, or saline sprays).  However, for sinusitis related to a bacterial infection, your health care provider will prescribe antibiotic medicines. These are medicines that will help kill the bacteria causing the infection.  Rarely, sinusitis is caused by a fungal infection. In theses cases, your health care provider will prescribe antifungal medicine. For some cases of chronic sinusitis, surgery is needed. Generally, these are cases in which sinusitis recurs more than 3 times per year, despite other treatments. HOME CARE INSTRUCTIONS   Drink plenty of water. Water helps thin the mucus so your sinuses can drain more easily.  Use a humidifier.  Inhale steam 3 to 4 times a day (for example, sit in the bathroom with the shower running).  Apply a warm, moist washcloth to your face 3 to 4 times a day, or as directed by your health care provider.  Use saline nasal sprays to help moisten and clean your sinuses.  Take medicines only as directed by your health care provider.  If you were prescribed either an antibiotic or antifungal medicine, finish it all even if you start to feel better. SEEK IMMEDIATE  MEDICAL CARE IF:  You have increasing pain or severe headaches.  You have nausea, vomiting, or drowsiness.  You have swelling around your face.  You have vision problems.  You have a stiff neck.  You have difficulty breathing. MAKE SURE YOU:   Understand these instructions.  Will watch your condition.  Will get help right away if you are not doing well or get worse. Document Released: 05/16/2005 Document Revised: 09/30/2013 Document Reviewed: 05/31/2011 Exit[employer] Patient Information 2015 [employer]. This information is not intended to replace advice given to you by your health care provider. Make sure you discuss any questions you have with your health care provider.   

## 2015-06-15 ENCOUNTER — Ambulatory Visit (INDEPENDENT_AMBULATORY_CARE_PROVIDER_SITE_OTHER): Payer: Managed Care, Other (non HMO) | Admitting: Internal Medicine

## 2015-06-15 ENCOUNTER — Encounter: Payer: Self-pay | Admitting: Internal Medicine

## 2015-06-15 VITALS — BP 118/88 | HR 92 | Ht 66.0 in | Wt 236.2 lb

## 2015-06-15 DIAGNOSIS — G4733 Obstructive sleep apnea (adult) (pediatric): Secondary | ICD-10-CM

## 2015-06-15 NOTE — Patient Instructions (Signed)
Glad you are doing well. Please call if we can help 

## 2015-06-15 NOTE — Progress Notes (Signed)
03/16/12- 50 yoF never smoker seen in allergy consultation at kind request of  Dr Landry Mellow allergy labs; had reaction to peanuts on Sat-had to use epipen. Husband here. Does not want flu shot. She does not consider herself an allergy person. She  needed Benadryl for urticaria for the first time one month ago. She was given an EpiPen then. On October 12 of this year she got into a car where a friend had been eating peanuts. Her throat began to swell. She was treated with prednisone and used her EpiPen. She says her throat is no better-pain and hoarseness. She has history of GERD but denies asthma or seasonal allergies. No history ENT surgery. CBC- 01/31/12- Eos 5.2 Allergy profiles 01/31/2012-total IgE 912 with specific elevations for many common inhalant allergens and foods. She has been trying to avoid the foods that were listed for her but has not recognized symptoms with any since the exposure to someone eating peanuts.  05/09/12- 50 yoF never smoker seen in allergy consultation FOLLOWS FOR: pt reports doing well today, x3 days ago reacted to peanuts at work --SOB, tongue swelling and wheezing used epi pen and symptoms have started improving today She felt fine 3 days ago when she hugged her friend and caught a smell of something. She assumed  friend had been eating peanuts but never asked her. Within a few minutes she began coughing and wheezing with tight throat. She used her EpiPen then went to emergency room. Now taking prednisone as directed. She still has some throat tickle and cough. Skin itches. Taking Zyrtec. Claritin makes her sleepy. Office spirometry-05/09/2012 normal spirometry-FVC 3.34/107%, FEV1 2.56/100%, FEV1/FEC 0.76, FEF 25-75% 2.20/69%.  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 2 weeks ago had conversation with parent to eating peanuts. While talking, her  throat began to tickle, tongue began to swell and throat felt closed. It helped to use EpiPen. One day ago, while working in [employer], same pattern. Again the school nurse had to give EpiPen and she was sent to [place]. She had felt tight in the chest going to work that day and had used rescue inhaler, Symbicort and her daily Allegra. She has been wearing a mask at work. She wants to work away from [employer] where peanut butter is be by the students. She doesn't recognize reflux or heartburn but I discussed this as a possible contribution to the pattern especially if she gets anxious.  10/25/12- 50 yoF never smoker seen in allergy consultation FOLLOWS FOR: still raspy in voice; unsure what will happen at work due to allergy. 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 She reports some wheeze. Stays hoarse without painful swallow.Was on prednisone 10 mg daily x 10 days, ending 1 week ago, but took a prednisone last night because of wheezing.  Using azelastine nasal spray. Husband says her chronic snoring is worse. She stays "tired". We discussed possible sleep apnea. Barium swallow was normal. She does not feel reflux.   10/31/12 50 yoF never smoker followed for allergic rhinitis, food allergy(peanut) No antihistamines, OTC  cough syrups, or OTC sleep aids x 3 days EXCEPT- Dymista last night, 17 hours ago. Dymista has helped some- less stuffy and less drainage, but no change in hoarseness. She continues Pepcid twice daily with no awareness of reflux. Trying to wear a mask outdoors. No  acute events/ peanut exposures since last here. Pending now for ENT evaluation for hoarseness and NPSG for snoring and tiredness.  Allergy Skin Test  10/31/12- Significant Positive for grass, weed tree pollens, dust mite, given allegra at end.  12/14/12- 50 yoF never smoker followed for allergic rhinitis, food allergy(peanut) 6 wk follow up.  Symptoms are  unchanged.  Has hoarseness, prod cough with green mucus, a little wheezing, itchy eyes, runny nose.   Dr. Stasia Cavalier gave her omeprazole to use at bedtime. Barium swallow was negative and she is not aware of reflux. Complains of head congestion, dizziness and itching in throat. Unclear about the effect of weather changes. Occasional green on some days. Feels postnasal drip despite Dymista nasal spray. 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 Weight 232 pounds.  02/11/13- 50 yoF never smoker followed for allergic rhinitis, food allergy(peanut), OSA FOLLOWS FOR: almost passed out in yard-was taking the trash out and blacked out(june 2014);Pt states that she is unable to work due to this. Dr Annalee Genta ENT attributed hoarseness to GERD, Rx'd omeprazole. Describes 2 episodes syncope w/o HA, palpitation, trauma. Woke spontaneously. Did not seek attention. Notes wheeze if bending over for housework. Continues Symbicort, proventil. We will want to try CPAP when she is able to cope.  08/12/13-50 yoF never smoker followed for allergic rhinitis, food allergy(peanut), OSA FOLLOWS FOR: 06-27-13 had attack at home(used epipen)-had eaten ceral-Honey nut Cheerios(had almonds in it). Went to ER.  [place]-exposed to peanuts-someone was eating peanut butter crackers-epipen used and 911 called. Almonds cause hoarseness and itching of throat. Peanut butter cracker near her triggered syncope. Eating chicken with paprika made throat itch and feel tight. Week is blaming for cough and loose stool. Occasional food hanging up. Frequent heartburn on Pepcid twice daily.  02/11/15- 50 yoF never smoker followed for allergic rhinitis, food allergy(peanut/ nut mix), OSA, GERDS C/o chest tx, hoarseness, wheezing, dizzy, prod cough (foamy clear phlem), nasal cong, PND, chills. Going on x friday.  Nut Mix Allergy Profile 08/12/13-  elevated for peanut, walnut, hazelnut, pecan,  She was driving with window open, recognized no smell or specific exposure but began chest tightness, throat tightness and respiratory distress. Still hoarse and intubation at ER where she was treated with Solu-Medrol, Benadryl, Pepcid, EpiPen and nebulizer. She takes Allegra and Benadryl routinely  06/15/2015-50 year old female never smoker followed for allergic rhinitis, food allergy (peanut/nut mix), OSA, GERD Follows for: Asthma. Pt states that her allergies and asthma are doing well. Pt denies cough/wheeze/SOB/CP/tightness.    ROS-see HPI Constitutional:   No-   weight loss, night sweats, fevers, chills, +fatigue, lassitude. HEENT:   No-  headaches, +difficulty swallowing, tooth/dental problems, sore throat, +hoarseness      No-  sneezing, itching, no- ear ache, +nasal congestion, +post nasal drip,  CV:  No-   chest pain, orthopnea, PND, swelling in lower extremities, anasarca,  dizziness, palpitations Resp: No-   shortness of breath with exertion or at rest.              No-   productive cough,  + non-productive cough,  No- coughing up of blood.              No-change in color of mucus.  N+ wheezing.   Skin: No-   rash or lesions. GI:  +  heartburn, indigestion, abdominal pain, nausea, vomiting,  GU: n. MS:  No-   joint pain or  swelling.   Neuro-  +syncope Psych:  No- change in mood or affect. No depression or anxiety.  No memory loss.  OBJ- Physical Exam General- Alert, Oriented, Affect-appropriate, Distress- none acute Skin- rash-none, lesions- none, excoriation- none Lymphadenopathy- none Head- atraumatic            Eyes- Gross vision intact, PERRLA, conjunctivae and secretions clear            Ears- Hearing, canals-normal            Nose- + sniffing, no-Septal dev, mucus, polyps, erosion, perforation             Throat- Mallampati III-IV , +mucosa-red, drainage- none, tonsils- atrophic, +hoarse unchanged Neck- flexible , trachea  midline, no stridor , thyroid nl, carotid no bruit Chest - symmetrical excursion , unlabored           Heart/CV- RRR , no murmur , no gallop  , no rub, nl s1 s2                           - JVD- none , edema- none, stasis changes- none, varices- none           Lung- clear to P&A, cough-none, dullness-none, rub- none           Chest wall-  Abd-  Br/ Gen/ Rectal- Not done, not indicated Extrem- cyanosis- none, clubbing, none, atrophy- none, strength- nl Neuro- grossly intact to observation

## 2015-07-14 ENCOUNTER — Emergency Department (HOSPITAL_COMMUNITY)
Admission: EM | Admit: 2015-07-14 | Discharge: 2015-07-14 | Disposition: A | Discharge status: Home / Self Care | Payer: Managed Care, Other (non HMO) | Attending: Emergency Medicine | Admitting: Emergency Medicine

## 2015-07-14 ENCOUNTER — Encounter (HOSPITAL_COMMUNITY): Payer: Self-pay | Admitting: Emergency Medicine

## 2015-07-14 ENCOUNTER — Telehealth: Payer: Self-pay | Admitting: Internal Medicine

## 2015-07-14 DIAGNOSIS — Z8639 Personal history of other endocrine, nutritional and metabolic disease: Secondary | ICD-10-CM | POA: Diagnosis not present

## 2015-07-14 DIAGNOSIS — Y9389 Activity, other specified: Secondary | ICD-10-CM | POA: Diagnosis not present

## 2015-07-14 DIAGNOSIS — Z8669 Personal history of other diseases of the nervous system and sense organs: Secondary | ICD-10-CM | POA: Insufficient documentation

## 2015-07-14 DIAGNOSIS — Z7951 Long term (current) use of inhaled steroids: Secondary | ICD-10-CM | POA: Diagnosis not present

## 2015-07-14 DIAGNOSIS — T7840XA Allergy, unspecified, initial encounter: Secondary | ICD-10-CM | POA: Insufficient documentation

## 2015-07-14 DIAGNOSIS — Y998 Other external cause status: Secondary | ICD-10-CM | POA: Diagnosis not present

## 2015-07-14 DIAGNOSIS — Y9289 Other specified places as the place of occurrence of the external cause: Secondary | ICD-10-CM | POA: Diagnosis not present

## 2015-07-14 DIAGNOSIS — L299 Pruritus, unspecified: Secondary | ICD-10-CM | POA: Diagnosis present

## 2015-07-14 DIAGNOSIS — X58XXXA Exposure to other specified factors, initial encounter: Secondary | ICD-10-CM | POA: Insufficient documentation

## 2015-07-14 DIAGNOSIS — Z8719 Personal history of other diseases of the digestive system: Secondary | ICD-10-CM | POA: Insufficient documentation

## 2015-07-14 DIAGNOSIS — Z79899 Other long term (current) drug therapy: Secondary | ICD-10-CM | POA: Insufficient documentation

## 2015-07-14 MED ORDER — METHYLPREDNISOLONE SODIUM SUCC 125 MG IJ SOLR
125.0000 mg | Freq: Once | INTRAMUSCULAR | Status: AC
  Administered 2015-07-14: 125 mg via INTRAVENOUS
  Filled 2015-07-14: qty 2

## 2015-07-14 MED ORDER — DIPHENHYDRAMINE HCL 25 MG PO TABS: 25.0000 mg | ORAL_TABLET | Freq: Four times a day (QID) | ORAL | Status: DC

## 2015-07-14 MED ORDER — FAMOTIDINE 20 MG PO TABS: 20.0000 mg | ORAL_TABLET | Freq: Two times a day (BID) | ORAL | Status: DC

## 2015-07-14 MED ORDER — PREDNISONE 10 MG (21) PO TBPK: 10.0000 mg | ORAL_TABLET | Freq: Every day | ORAL | Status: DC

## 2015-07-14 MED ORDER — FAMOTIDINE IN NACL 20-0.9 MG/50ML-% IV SOLN
20.0000 mg | Freq: Once | INTRAVENOUS | Status: AC
  Administered 2015-07-14: 20 mg via INTRAVENOUS
  Filled 2015-07-14: qty 50

## 2015-07-14 NOTE — ED Provider Notes (Signed)
CSN: [id number]     Arrival date & time 07/14/15  4098 History   First MD Initiated Contact with Patient 07/14/15 [phone]     No chief complaint on file.    (Consider location/radiation/quality/duration/timing/severity/associated sxs/prior Treatment) The history is provided by the patient.     Pt with hx allergic reaction, asthma presents with two days of throat itching and tightness, urticarial rash and itching that was initially improving with home treatment but suddenly worsened this morning.  She used her epi pen this morning between 9-10am with great relief.  Reports tightness in throat, SOB, hives are all resolved currently.  She thinks this began with eating paprika at her sister's house two days ago.    Past Medical History  Diagnosis Date  . GERD   . HYPERLIPIDEMIA, WITH LOW HDL   . POSITIVE PPD 1987    s/p 78mo tx  . MIGRAINE HEADACHE   . PERIPHERAL NEUROPATHY   . 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 Hives of unknown origin     chronic and recurrent urticaria since 01/2012   Past Surgical History  Procedure Laterality Date  . Appendectomy  1992  . Abdominal hysterectomy  1996  . Cesarean section  1989  . Cholecystectomy  04/22/2011    Procedure: LAPAROSCOPIC CHOLECYSTECTOMY WITH INTRAOPERATIVE CHOLANGIOGRAM;  Surgeon: Almond Lint, MD;  Location: [facility] OR;  Service: General;  Laterality: N/A;   Family History  Problem Relation Age of Onset  . Diabetes Mother   . Prostate cancer Father   . Hypertension Father   . Heart disease Father   . Diabetes Brother   . Alcohol abuse Other   . Arthritis Other   . Hypertension Other    Social History  Substance Use Topics  . Smoking status: Never Smoker   . Smokeless tobacco: Never Used  . Alcohol Use: No   OB History    Gravida Para Term Preterm AB TAB SAB Ectopic Multiple Living   Review of Systems    Allergies  Other; Peanuts; Corn dextrin; Gluten meal; Shellfish  allergy; Soy allergy; Sunflower oil; Tomato; Wheat bran; and Morphine  Home Medications   Prior to Admission medications   Medication Sig Start Date End Date Taking? Authorizing Provider  albuterol (PROVENTIL) (2.5 MG/3ML) 0.083% nebulizer solution Take 3 mLs (2.5 mg total) by nebulization every 6 (six) hours as needed for wheezing or shortness of breath. 02/11/15   Waymon Budge, MD  Albuterol Sulfate (PROAIR RESPICLICK) 108 (90 BASE) MCG/ACT AEPB Inhale 1-2 puffs into the lungs every 6 (six) hours. 04/10/14   Etta Grandchild, MD  brimonidine (ALPHAGAN) 0.15 % ophthalmic solution Place 1 drop into both eyes 2 (two) times daily.    Historical Provider, MD  budesonide-formoterol (SYMBICORT) 80-4.5 MCG/ACT inhaler Inhale 2 puffs into the lungs 2 (two) times daily. 07/23/14   Newt Lukes, MD  chlorpheniramine-HYDROcodone (TUSSIONEX PENNKINETIC ER) 10-8 MG/5ML SUER Take 5 mLs by mouth at bedtime as needed for cough. 04/30/15   Veryl Speak, FNP  diphenhydrAMINE (BENADRYL) 12.5 MG/5ML liquid Take 25 mg by mouth every 4 (four) hours as needed for allergies.    Historical Provider, MD  diphenhydrAMINE (BENADRYL) 25 MG tablet Take 2 tablets (50 mg total) by mouth every 4 (four) hours as needed for itching or allergies. 07/11/14   Pricilla Loveless, MD  EPINEPHrine 0.3 mg/0.3 mL  IJ SOAJ injection Inject 0.3 mLs (0.3 mg total) into the muscle once. 02/07/15   Shon Baton, MD  fexofenadine (ALLEGRA) 180 MG tablet Take 180 mg by mouth daily.    Historical Provider, MD  ibuprofen (ADVIL,MOTRIN) 200 MG tablet Take 400 mg by mouth every 6 (six) hours as needed for fever, headache, mild pain, moderate pain or cramping.    Historical Provider, MD  Nebulizers (COMPRESSOR/NEBULIZER) MISC As directed 02/11/15   Waymon Budge, MD  Pyridoxine HCl (B-6 PO) Take 1 tablet by mouth daily at 12 noon.    Historical Provider, MD   BP 118/75 mmHg  Pulse 98  Temp(Src) 98.3 F (36.8 C) (Oral)  Resp 16  SpO2  99% Physical Exam  Constitutional: She appears well-developed and well-nourished. No distress.  HENT:  Head: Normocephalic and atraumatic.  Mouth/Throat: Oropharynx is clear and moist.  Neck: Neck supple.  Cardiovascular: Normal rate and regular rhythm.   Pulmonary/Chest: Effort normal and breath sounds normal. No respiratory distress. She has no wheezes. She has no rales.  Abdominal: Soft. She exhibits no distension. There is no tenderness. There is no rebound and no guarding.  Neurological: She is alert.  Skin: No rash noted. She is not diaphoretic.  Nursing note and vitals reviewed.   ED Course  Procedures (including critical care time) Labs Review Labs Reviewed - No data to display  Imaging Review No results found. I have personally reviewed and evaluated these images and lab results as part of my medical decision-making.   EKG Interpretation None       1:14 PM Pt reports she continues to feel well, no further symptoms.    MDM   Final diagnoses:  Allergic reaction, initial encounter   Afebrile, nontoxic patient with allergic reaction, epi pen administered at home.  Monitored in ED with no worsening symptoms. Solumedrol, pepcid given in ED   D/C home with prednisone, pepcid, benadryl, PCP follow up.  Pt has epi pen refill available to her.   Discussed result, findings, treatment, and follow up  with patient.  Pt given return precautions.  Pt verbalizes understanding and agrees with plan.        Trixie Dredge, PA-C 07/14/15 1537  Derwood Kaplan, MD 07/15/15 [phone]

## 2015-07-14 NOTE — Discharge Instructions (Signed)
Read the information below.  Use the prescribed medication as directed.  Please discuss all new medications with your pharmacist.  You may return to the Emergency Department at any time for worsening condition or any new symptoms that concern you.    If you develop itching or swelling in your mouth or throat or any difficulty swallowing or breathing, call 911 or return to the Emergency Department immediately for a recheck.    ° ° °Allergies °An allergy is an abnormal reaction to a substance by the body's defense system (immune system). Allergies can develop at any age. °WHAT CAUSES ALLERGIES? °An allergic reaction happens when the immune system mistakenly reacts to a normally harmless substance, called an allergen, as if it were harmful. The immune system releases antibodies to fight the substance. Antibodies eventually release a chemical called histamine into the bloodstream. The release of histamine is meant to protect the body from infection, but it also causes discomfort. °An allergic reaction can be triggered by: °· Eating an allergen. °· Inhaling an allergen. °· Touching an allergen. °WHAT TYPES OF ALLERGIES ARE THERE? °There are many types of allergies. Common types include: °· Seasonal allergies. People with this type of allergy are usually allergic to substances that are only present during certain seasons, such as molds and pollens. °· Food allergies. °· Drug allergies. °· Insect allergies. °· Animal dander allergies. °WHAT ARE SYMPTOMS OF ALLERGIES? °Possible allergy symptoms include: °· Swelling of the lips, face, tongue, mouth, or throat. °· Sneezing, coughing, or wheezing. °· Nasal congestion. °· Tingling in the mouth. °· Rash. °· Itching. °· Itchy, red, swollen areas of skin (hives). °· Watery eyes. °· Vomiting. °· Diarrhea. °· Dizziness. °· Lightheadedness. °· Fainting. °· Trouble breathing or swallowing. °· Chest tightness. °· Rapid heartbeat. °HOW ARE ALLERGIES DIAGNOSED? °Allergies are diagnosed  with a medical and family history and one or more of the following: °· Skin tests. °· Blood tests. °· A food diary. A food diary is a record of all the foods and drinks you have in a day and of all the symptoms you experience. °· The results of an elimination diet. An elimination diet involves eliminating foods from your diet and then adding them back in one by one to find out if a certain food causes an allergic reaction. °HOW ARE ALLERGIES TREATED? °There is no cure for allergies, but allergic reactions can be treated with medicine. Severe reactions usually need to be treated at a hospital. °HOW CAN REACTIONS BE PREVENTED? °The best way to prevent an allergic reaction is by avoiding the substance you are allergic to. Allergy shots and medicines can also help prevent reactions in some cases. People with severe allergic reactions may be able to prevent a life-threatening reaction called anaphylaxis with a medicine given right after exposure to the allergen. °  °This information is not intended to replace advice given to you by your health care provider. Make sure you discuss any questions you have with your health care provider. °  °Document Released: 08/09/2002 Document Revised: 06/06/2014 Document Reviewed: 02/25/2014 °Elsevier Interactive Patient Education ©2016 [employer] ° °

## 2015-07-14 NOTE — ED Notes (Signed)
Pt reports that she is having a rxn from the spice paprika that she ate on Tuesday

## 2015-07-14 NOTE — ED Notes (Addendum)
Pt c/o of allergic rxn x 2 days and was worse today with itchy all over and some throat tightness. Pt gave herself epi pen at home. EMS gave Benadryl 50 mg and 5 mg Albuterol in route. Pt allergic to nuts and shellfish but she is unsure of what is causing her rxn. BP 143/82 HR 111 O2-99% 16 Resp and CBG 149.

## 2015-07-14 NOTE — ED Notes (Signed)
Bed: XB14 Expected date: 07/14/15 Expected time:  Means of arrival:  Comments: ems

## 2015-07-14 NOTE — Telephone Encounter (Signed)
[facility] - Client TELEPHONE ADVICE RECORD [facility]  Patient Name: Megan Wilkerson  DOB: 11/28/1965    Initial Comment Caller states she's wheezing. She had a allergic reaction Sunday.   Nurse Assessment  Nurse: Laural Benes, RN, Dondra Spry Date/Time Lamount Cohen Time): 07/14/2015 8:47:13 AM  Confirm and document reason for call. If symptomatic, describe symptoms. You must click the next button to save text entered. ---[name] calling in very hoarse states she is wheezing and can't breath very difficult for her to speak to Nurse advised to call 911 now -- states she had an allergic reaction on Sunday and getting worse.  Has the patient traveled out of the country within the last 30 days? ---No  Does the patient have any new or worsening symptoms? ---Yes  Will a triage be completed? ---No  Select reason for no triage. ---Other  Please document clinical information provided and list any resource used. ---Nurse feels she needs immediate attention and needs to have EMT's on site. advised to call 911.     Guidelines    Guideline Title Affirmed Question Affirmed Notes  Breathing Difficulty SEVERE difficulty breathing (e.g., struggling for each breath, speaks in single words)    Final Disposition User   Call EMS 911 Now Laural Benes, RN, Dondra Spry    Referrals  GO TO FACILITY UNDECIDED   Disagree/Comply: Comply

## 2015-07-27 ENCOUNTER — Encounter: Payer: Self-pay | Admitting: Internal Medicine

## 2015-07-27 ENCOUNTER — Other Ambulatory Visit (INDEPENDENT_AMBULATORY_CARE_PROVIDER_SITE_OTHER): Payer: Managed Care, Other (non HMO)

## 2015-07-27 ENCOUNTER — Ambulatory Visit (INDEPENDENT_AMBULATORY_CARE_PROVIDER_SITE_OTHER): Payer: Managed Care, Other (non HMO) | Admitting: Internal Medicine

## 2015-07-27 VITALS — BP 116/76 | HR 89 | Temp 98.3°F | Resp 14 | Ht 66.0 in | Wt 231.0 lb

## 2015-07-27 DIAGNOSIS — Z0000 Encounter for general adult medical examination without abnormal findings: Secondary | ICD-10-CM | POA: Diagnosis not present

## 2015-07-27 LAB — COMPREHENSIVE METABOLIC PANEL
ALBUMIN: 4.1 g/dL (ref 3.5–5.2)
ALT: 18 U/L (ref 0–35)
AST: 13 U/L (ref 0–37)
Alkaline Phosphatase: 64 U/L (ref 39–117)
BUN: 8 mg/dL (ref 6–23)
CHLORIDE: 108 meq/L (ref 96–112)
CO2: 28 mEq/L (ref 19–32)
CREATININE: 0.81 mg/dL (ref 0.40–1.20)
Calcium: 9.5 mg/dL (ref 8.4–10.5)
GFR: 96.78 mL/min (ref >60.00)
GLUCOSE: 113 mg/dL — AB (ref 70–99)
Potassium: 3.9 mEq/L (ref 3.5–5.1)
SODIUM: 142 meq/L (ref 135–145)
TOTAL PROTEIN: 7.4 g/dL (ref 6.0–8.3)
Total Bilirubin: 0.3 mg/dL (ref 0.2–1.2)

## 2015-07-27 LAB — LIPID PANEL
CHOLESTEROL: 186 mg/dL (ref 0–200)
HDL: 39.7 mg/dL (ref >39.00)
LDL CALC: 114 mg/dL — AB (ref 0–99)
NONHDL: 145.98
Total CHOL/HDL Ratio: 5
Triglycerides: 158 mg/dL — ABNORMAL HIGH (ref 0.0–149.0)
VLDL: 31.6 mg/dL (ref 0.0–40.0)

## 2015-07-27 LAB — HEMOGLOBIN A1C: HEMOGLOBIN A1C: 6.1 % (ref 4.6–6.5)

## 2015-07-27 LAB — CBC
HCT: 41.7 % (ref 36.0–46.0)
HEMOGLOBIN: 14.2 g/dL (ref 12.0–15.0)
MCHC: 34.1 g/dL (ref 30.0–36.0)
MCV: 88 fl (ref 78.0–100.0)
Platelets: 182 10*3/uL (ref 150.0–400.0)
RBC: 4.73 Mil/uL (ref 3.87–5.11)
RDW: 12.8 % (ref 11.5–15.5)
WBC: 6.1 10*3/uL (ref 4.0–10.5)

## 2015-07-27 MED ORDER — FLUTICASONE PROPIONATE 50 MCG/ACT NA SUSP: 2.0000 | Freq: Every day | NASAL | Status: DC

## 2015-07-27 NOTE — Progress Notes (Signed)
Pre visit review using our clinic review tool, if applicable. No additional management support is needed unless otherwise documented below in the visit note. 

## 2015-07-27 NOTE — Patient Instructions (Signed)
We will send in the flonase which is the nose spray for you. Use 2 sprays in each nostril once a day to help with the inflammation in your sinuses.   We will check the labs today and call you back with the results.   Once the allergies get under better control think about walking again or finding a different indoor activity if the allergies are still doing bad.   Health Maintenance, Female Adopting a healthy lifestyle and getting preventive care can go a long way to promote health and wellness. Talk with your health care provider about what schedule of regular examinations is right for you. This is a good chance for you to check in with your provider about disease prevention and staying healthy. In between checkups, there are plenty of things you can do on your own. Experts have done a lot of research about which lifestyle changes and preventive measures are most likely to keep you healthy. Ask your health care provider for more information. WEIGHT AND DIET  Eat a healthy diet  Be sure to include plenty of vegetables, fruits, low-fat dairy products, and lean protein.  Do not eat a lot of foods high in solid fats, added sugars, or salt.  Get regular exercise. This is one of the most important things you can do for your health.  Most adults should exercise for at least 150 minutes each week. The exercise should increase your heart rate and make you sweat (moderate-intensity exercise).  Most adults should also do strengthening exercises at least twice a week. This is in addition to the moderate-intensity exercise.  Maintain a healthy weight  Body mass index (BMI) is a measurement that can be used to identify possible weight problems. It estimates body fat based on height and weight. Your health care provider can help determine your BMI and help you achieve or maintain a healthy weight.  For females 11 years of age and older:   A BMI below 18.5 is considered underweight.  A BMI of 18.5 to  24.9 is normal.  A BMI of 25 to 29.9 is considered overweight.  A BMI of 30 and above is considered obese.  Watch levels of cholesterol and blood lipids  You should start having your blood tested for lipids and cholesterol at 49 years of age, then have this test every 5 years.  You may need to have your cholesterol levels checked more often if:  Your lipid or cholesterol levels are high.  You are older than 49 years of age.  You are at high risk for heart disease.  CANCER SCREENING   Lung Cancer  Lung cancer screening is recommended for adults 34-65 years old who are at high risk for lung cancer because of a history of smoking.  A yearly low-dose CT scan of the lungs is recommended for people who:  Currently smoke.  Have quit within the past 15 years.  Have at least a 30-pack-year history of smoking. A pack year is smoking an average of one pack of cigarettes a day for 1 year.  Yearly screening should continue until it has been 15 years since you quit.  Yearly screening should stop if you develop a health problem that would prevent you from having lung cancer treatment.  Breast Cancer  Practice breast self-awareness. This means understanding how your breasts normally appear and feel.  It also means doing regular breast self-exams. Let your health care provider know about any changes, no matter how small.  If  you are in your 20s or 30s, you should have a clinical breast exam (CBE) by a health care provider every 1-3 years as part of a regular health exam.  If you are 64 or older, have a CBE every year. Also consider having a breast X-ray (mammogram) every year.  If you have a family history of breast cancer, talk to your health care provider about genetic screening.  If you are at high risk for breast cancer, talk to your health care provider about having an MRI and a mammogram every year.  Breast cancer gene (BRCA) assessment is recommended for women who have family  members with BRCA-related cancers. BRCA-related cancers include:  Breast.  Ovarian.  Tubal.  Peritoneal cancers.  Results of the assessment will determine the need for genetic counseling and BRCA1 and BRCA2 testing. Cervical Cancer Your health care provider may recommend that you be screened regularly for cancer of the pelvic organs (ovaries, uterus, and vagina). This screening involves a pelvic examination, including checking for microscopic changes to the surface of your cervix (Pap test). You may be encouraged to have this screening done every 3 years, beginning at age 57.  For women ages 51-65, health care providers may recommend pelvic exams and Pap testing every 3 years, or they may recommend the Pap and pelvic exam, combined with testing for human papilloma virus (HPV), every 5 years. Some types of HPV increase your risk of cervical cancer. Testing for HPV may also be done on women of any age with unclear Pap test results.  Other health care providers may not recommend any screening for nonpregnant women who are considered low risk for pelvic cancer and who do not have symptoms. Ask your health care provider if a screening pelvic exam is right for you.  If you have had past treatment for cervical cancer or a condition that could lead to cancer, you need Pap tests and screening for cancer for at least 20 years after your treatment. If Pap tests have been discontinued, your risk factors (such as having a new sexual partner) need to be reassessed to determine if screening should resume. Some women have medical problems that increase the chance of getting cervical cancer. In these cases, your health care provider may recommend more frequent screening and Pap tests. Colorectal Cancer  This type of cancer can be detected and often prevented.  Routine colorectal cancer screening usually begins at 49 years of age and continues through 49 years of age.  Your health care provider may recommend  screening at an earlier age if you have risk factors for colon cancer.  Your health care provider may also recommend using home test kits to check for hidden blood in the stool.  A small camera at the end of a tube can be used to examine your colon directly (sigmoidoscopy or colonoscopy). This is done to check for the earliest forms of colorectal cancer.  Routine screening usually begins at age 63.  Direct examination of the colon should be repeated every 5-10 years through 49 years of age. However, you may need to be screened more often if early forms of precancerous polyps or small growths are found. Skin Cancer  Check your skin from head to toe regularly.  Tell your health care provider about any new moles or changes in moles, especially if there is a change in a mole's shape or color.  Also tell your health care provider if you have a mole that is larger than the size  of a pencil eraser.  Always use sunscreen. Apply sunscreen liberally and repeatedly throughout the day.  Protect yourself by wearing long sleeves, pants, a wide-brimmed hat, and sunglasses whenever you are outside. HEART DISEASE, DIABETES, AND HIGH BLOOD PRESSURE   High blood pressure causes heart disease and increases the risk of stroke. High blood pressure is more likely to develop in:  People who have blood pressure in the high end of the normal range (130-139/85-89 mm Hg).  People who are overweight or obese.  People who are African American.  If you are 51-70 years of age, have your blood pressure checked every 3-5 years. If you are 43 years of age or older, have your blood pressure checked every year. You should have your blood pressure measured twice--once when you are at a hospital or clinic, and once when you are not at a hospital or clinic. Record the average of the two measurements. To check your blood pressure when you are not at a hospital or clinic, you can use:  An automated blood pressure machine at a  pharmacy.  A home blood pressure monitor.  If you are between 36 years and 66 years old, ask your health care provider if you should take aspirin to prevent strokes.  Have regular diabetes screenings. This involves taking a blood sample to check your fasting blood sugar level.  If you are at a normal weight and have a low risk for diabetes, have this test once every three years after 49 years of age.  If you are overweight and have a high risk for diabetes, consider being tested at a younger age or more often. PREVENTING INFECTION  Hepatitis B  If you have a higher risk for hepatitis B, you should be screened for this virus. You are considered at high risk for hepatitis B if:  You were born in a country where hepatitis B is common. Ask your health care provider which countries are considered high risk.  Your parents were born in a high-risk country, and you have not been immunized against hepatitis B (hepatitis B vaccine).  You have HIV or AIDS.  You use needles to inject street drugs.  You live with someone who has hepatitis B.  You have had sex with someone who has hepatitis B.  You get hemodialysis treatment.  You take certain medicines for conditions, including cancer, organ transplantation, and autoimmune conditions. Hepatitis C  Blood testing is recommended for:  Everyone born from [birth year] through 1964.  Anyone with known risk factors for hepatitis C. Sexually transmitted infections (STIs)  You should be screened for sexually transmitted infections (STIs) including gonorrhea and chlamydia if:  You are sexually active and are younger than 49 years of age.  You are older than 49 years of age and your health care provider tells you that you are at risk for this type of infection.  Your sexual activity has changed since you were last screened and you are at an increased risk for chlamydia or gonorrhea. Ask your health care provider if you are at risk.  If you do not  have HIV, but are at risk, it may be recommended that you take a prescription medicine daily to prevent HIV infection. This is called pre-exposure prophylaxis (PrEP). You are considered at risk if:  You are sexually active and do not regularly use condoms or know the HIV status of your partner(s).  You take drugs by injection.  You are sexually active with a partner who has  HIV. Talk with your health care provider about whether you are at high risk of being infected with HIV. If you choose to begin PrEP, you should first be tested for HIV. You should then be tested every 3 months for as long as you are taking PrEP.  PREGNANCY   If you are premenopausal and you may become pregnant, ask your health care provider about preconception counseling.  If you may become pregnant, take 400 to 800 micrograms (mcg) of folic acid every day.  If you want to prevent pregnancy, talk to your health care provider about birth control (contraception). OSTEOPOROSIS AND MENOPAUSE   Osteoporosis is a disease in which the bones lose minerals and strength with aging. This can result in serious bone fractures. Your risk for osteoporosis can be identified using a bone density scan.  If you are 50 years of age or older, or if you are at risk for osteoporosis and fractures, ask your health care provider if you should be screened.  Ask your health care provider whether you should take a calcium or vitamin D supplement to lower your risk for osteoporosis.  Menopause may have certain physical symptoms and risks.  Hormone replacement therapy may reduce some of these symptoms and risks. Talk to your health care provider about whether hormone replacement therapy is right for you.  HOME CARE INSTRUCTIONS   Schedule regular health, dental, and eye exams.  Stay current with your immunizations.   Do not use any tobacco products including cigarettes, chewing tobacco, or electronic cigarettes.  If you are pregnant, do not  drink alcohol.  If you are breastfeeding, limit how much and how often you drink alcohol.  Limit alcohol intake to no more than 1 drink per day for nonpregnant women. One drink equals 12 ounces of beer, 5 ounces of wine, or 1 ounces of hard liquor.  Do not use street drugs.  Do not share needles.  Ask your health care provider for help if you need support or information about quitting drugs.  Tell your health care provider if you often feel depressed.  Tell your health care provider if you have ever been abused or do not feel safe at home.   This information is not intended to replace advice given to you by your health care provider. Make sure you discuss any questions you have with your health care provider.   Document Released: 11/29/2010 Document Revised: 06/06/2014 Document Reviewed: 04/17/2013 Elsevier Interactive Patient Education [employer].

## 2015-07-30 DIAGNOSIS — Z0000 Encounter for general adult medical examination without abnormal findings: Secondary | ICD-10-CM | POA: Insufficient documentation

## 2015-07-30 NOTE — Progress Notes (Signed)
Subjective:    Patient ID: Megan Wilkerson, female    DOB: Sep 30, 1965, 51 y.o.   MRN: [id number]  HPI The patient is a 50 YO female coming in for wellness. No new concerns. Had an allergy reaction with facial swelling in the last month and did course of prednisone. Not exercising lately.   PMH, [facility], social history reviewed and updated.   Review of Systems  Constitutional: Positive for appetite change. Negative for fever, chills, activity change, fatigue and unexpected weight change.  HENT: Negative for congestion, dental problem, postnasal drip, rhinorrhea and sinus pressure.   Eyes: Negative.   Respiratory: Positive for shortness of breath. Negative for cough, chest tightness and wheezing.   Cardiovascular: Negative for chest pain, palpitations and leg swelling.  Gastrointestinal: Negative for nausea, abdominal pain, diarrhea, constipation and abdominal distention.  Musculoskeletal: Negative.   Skin: Negative.   Neurological: Negative.   Psychiatric/Behavioral: Negative.       Objective:   Physical Exam  Constitutional: She is oriented to person, place, and time. She appears well-developed and well-nourished.  Overweight  HENT:  Head: Normocephalic and atraumatic.  Eyes: EOM are normal.  No swelling in the face or mouth  Neck: Normal range of motion.  Cardiovascular: Normal rate and regular rhythm.   Pulmonary/Chest: Effort normal and breath sounds normal. No respiratory distress. She has no wheezes. She has no rales.  Abdominal: Soft. Bowel sounds are normal. She exhibits no distension. There is no tenderness. There is no rebound.  Musculoskeletal: She exhibits no edema.  Neurological: She is alert and oriented to person, place, and time. Coordination normal.  Skin: Skin is warm and dry.  Psychiatric: She has a normal mood and affect.   Filed Vitals:   07/27/15 1108  BP: 116/76  Pulse: 89  Temp: 98.3 F (36.8 C)  TempSrc: Oral  Resp: 14  Height: 5\' 6"  (1.676 m)    Weight: 231 lb (104.781 kg)  SpO2: 98%      Assessment & Plan:

## 2015-07-30 NOTE — Assessment & Plan Note (Addendum)
BP at goal, non-smoker. Talked to her about exercise (indoor since allergens bother her). 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 She has epi pen. Immunizations up to date, too young for colonoscopy, mammogram up to date. Checking labs to rule out complications of her weight.

## 2015-08-21 ENCOUNTER — Emergency Department (HOSPITAL_COMMUNITY)
Admission: EM | Admit: 2015-08-21 | Discharge: 2015-08-21 | Disposition: A | Discharge status: Home / Self Care | Payer: Managed Care, Other (non HMO) | Attending: Emergency Medicine | Admitting: Emergency Medicine

## 2015-08-21 ENCOUNTER — Encounter (HOSPITAL_COMMUNITY): Payer: Self-pay

## 2015-08-21 DIAGNOSIS — Y9289 Other specified places as the place of occurrence of the external cause: Secondary | ICD-10-CM | POA: Diagnosis not present

## 2015-08-21 DIAGNOSIS — F419 Anxiety disorder, unspecified: Secondary | ICD-10-CM | POA: Diagnosis not present

## 2015-08-21 DIAGNOSIS — K219 Gastro-esophageal reflux disease without esophagitis: Secondary | ICD-10-CM | POA: Insufficient documentation

## 2015-08-21 DIAGNOSIS — Z8679 Personal history of other diseases of the circulatory system: Secondary | ICD-10-CM | POA: Insufficient documentation

## 2015-08-21 DIAGNOSIS — Z872 Personal history of diseases of the skin and subcutaneous tissue: Secondary | ICD-10-CM | POA: Insufficient documentation

## 2015-08-21 DIAGNOSIS — X58XXXA Exposure to other specified factors, initial encounter: Secondary | ICD-10-CM | POA: Insufficient documentation

## 2015-08-21 DIAGNOSIS — T7840XA Allergy, unspecified, initial encounter: Secondary | ICD-10-CM

## 2015-08-21 DIAGNOSIS — Z8669 Personal history of other diseases of the nervous system and sense organs: Secondary | ICD-10-CM | POA: Diagnosis not present

## 2015-08-21 DIAGNOSIS — Z8639 Personal history of other endocrine, nutritional and metabolic disease: Secondary | ICD-10-CM | POA: Diagnosis not present

## 2015-08-21 DIAGNOSIS — Y9389 Activity, other specified: Secondary | ICD-10-CM | POA: Diagnosis not present

## 2015-08-21 DIAGNOSIS — R1310 Dysphagia, unspecified: Secondary | ICD-10-CM | POA: Insufficient documentation

## 2015-08-21 DIAGNOSIS — Y998 Other external cause status: Secondary | ICD-10-CM | POA: Insufficient documentation

## 2015-08-21 DIAGNOSIS — Z79899 Other long term (current) drug therapy: Secondary | ICD-10-CM | POA: Insufficient documentation

## 2015-08-21 MED ORDER — EPINEPHRINE 0.3 MG/0.3ML IJ SOAJ
INTRAMUSCULAR | Status: AC
  Filled 2015-08-21: qty 0.3

## 2015-08-21 MED ORDER — EPINEPHRINE 0.3 MG/0.3ML IJ SOAJ
0.3000 mg | Freq: Once | INTRAMUSCULAR | Status: DC
Start: 2015-08-21 — End: 2017-01-22

## 2015-08-21 MED ORDER — DEXAMETHASONE SODIUM PHOSPHATE 10 MG/ML IJ SOLN
10.0000 mg | Freq: Once | INTRAMUSCULAR | Status: AC
  Administered 2015-08-21: 10 mg via INTRAVENOUS
  Filled 2015-08-21: qty 1

## 2015-08-21 MED ORDER — DIPHENHYDRAMINE HCL 50 MG/ML IJ SOLN
50.0000 mg | Freq: Once | INTRAMUSCULAR | Status: AC
  Administered 2015-08-21: 50 mg via INTRAMUSCULAR
  Filled 2015-08-21: qty 1

## 2015-08-21 MED ORDER — EPINEPHRINE 0.3 MG/0.3ML IJ SOAJ
0.3000 mg | Freq: Once | INTRAMUSCULAR | Status: AC
  Administered 2015-08-21: 0.3 mg via INTRAMUSCULAR

## 2015-08-21 MED ORDER — FAMOTIDINE IN NACL 20-0.9 MG/50ML-% IV SOLN
20.0000 mg | Freq: Once | INTRAVENOUS | Status: AC
  Administered 2015-08-21: 20 mg via INTRAVENOUS
  Filled 2015-08-21: qty 50

## 2015-08-21 MED ORDER — DIPHENHYDRAMINE HCL 25 MG PO CAPS
25.0000 mg | ORAL_CAPSULE | Freq: Once | ORAL | Status: AC
  Administered 2015-08-21: 25 mg via ORAL
  Filled 2015-08-21: qty 1

## 2015-08-21 MED ORDER — PREDNISONE 20 MG PO TABS: 40.0000 mg | ORAL_TABLET | Freq: Every day | ORAL | Status: DC

## 2015-08-21 NOTE — ED Notes (Signed)
Pt alert and oriented, voice continues to be sore and hoarse. Pt and family verbalize understanding of d/c instructions

## 2015-08-21 NOTE — ED Provider Notes (Signed)
CSN: [id number]     Arrival date & time 08/21/15  2044 History   First MD Initiated Contact with Patient 08/21/15 2056     Chief Complaint  Patient presents with  . Allergic Reaction     (Consider location/radiation/quality/duration/timing/severity/associated sxs/prior Treatment) HPI Comments: Patient here after having trouble swallowing and breathing just prior to arrival. 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 She took a dose of her home epinephrine pen and became very anxious. She presents at this time. No further history obtainable due to her current condition  Patient is a 50 y.o. female presenting with allergic reaction. The history is provided by the patient and the spouse. The history is limited by the condition of the patient.  Allergic Reaction   Past Medical History  Diagnosis Date  . GERD   . HYPERLIPIDEMIA, WITH LOW HDL   . POSITIVE PPD 1987    s/p 55mo tx  . MIGRAINE HEADACHE   . PERIPHERAL NEUROPATHY   . 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 Hives of unknown origin     chronic and recurrent urticaria since 01/2012   Past Surgical History  Procedure Laterality Date  . Appendectomy  1992  . Abdominal hysterectomy  1996  . Cesarean section  1989  . Cholecystectomy  04/22/2011    Procedure: LAPAROSCOPIC CHOLECYSTECTOMY WITH INTRAOPERATIVE CHOLANGIOGRAM;  Surgeon: Almond Lint, MD;  Location: [facility] OR;  Service: General;  Laterality: N/A;   Family History  Problem Relation Age of Onset  . Diabetes Mother   . Prostate cancer Father   . Hypertension Father   . Heart disease Father   . Diabetes Brother   . Alcohol abuse Other   . Arthritis Other   . Hypertension Other    Social History  Substance Use Topics  . Smoking status: Never Smoker   . Smokeless tobacco: Never Used  . Alcohol Use: No   OB History    Gravida Para Term Preterm AB TAB SAB  Ectopic Multiple Living   Review of Systems  Unable to perform ROS: Acuity of condition      Allergies  Other; Peanuts; Corn dextrin; Gluten meal; Shellfish allergy; Soy allergy; Sunflower oil; Tomato; Wheat bran; and Morphine  Home Medications   Prior to Admission medications   Medication Sig Start Date End Date Taking? Authorizing Provider  albuterol (PROVENTIL) (2.5 MG/3ML) 0.083% nebulizer solution Take 3 mLs (2.5 mg total) by nebulization every 6 (six) hours as needed for wheezing or shortness of breath. 02/11/15   Waymon Budge, MD  Albuterol Sulfate (PROAIR RESPICLICK) 108 (90 BASE) MCG/ACT AEPB Inhale 1-2 puffs into the lungs every 6 (six) hours. 04/10/14   Etta Grandchild, MD  brimonidine (ALPHAGAN) 0.15 % ophthalmic solution Place 1 drop into both eyes 2 (two) times daily.    Historical Provider, MD  budesonide-formoterol (SYMBICORT) 80-4.5 MCG/ACT inhaler Inhale 2 puffs into the lungs 2 (two) times daily. 07/23/14   Newt Lukes, MD  diphenhydrAMINE (BENADRYL) 25 MG tablet Take 1 tablet (25 mg total) by mouth every 6 (six) hours. X 3 days then PRN itching, allergic reaction 07/14/15   Trixie Dredge, PA-C  EPINEPHrine 0.3 mg/0.3 mL IJ SOAJ injection Inject 0.3 mLs (0.3 mg total) into the muscle once. 02/07/15   Shon Baton,  MD  famotidine (PEPCID) 20 MG tablet Take 1 tablet (20 mg total) by mouth 2 (two) times daily. X 3 days then PRN allergic reaction 07/14/15   Trixie Dredge, PA-C  fexofenadine (ALLEGRA) 180 MG tablet Take 180 mg by mouth daily.    Historical Provider, MD  fluticasone (FLONASE) 50 MCG/ACT nasal spray Place 2 sprays into both nostrils daily. 07/27/15   Myrlene Broker, MD  ibuprofen (ADVIL,MOTRIN) 200 MG tablet Take 400 mg by mouth every 6 (six) hours as needed for fever, headache, mild pain, moderate pain or cramping.    Historical Provider, MD  Nebulizers (COMPRESSOR/NEBULIZER) MISC As directed 02/11/15   Waymon Budge, MD  Pyridoxine  HCl (B-6 PO) Take 1 tablet by mouth daily at 12 noon.    Historical Provider, MD   BP 141/91 mmHg  Pulse 114  Temp(Src) 98 F (36.7 C) (Oral)  Resp 17  SpO2 97% Physical Exam  Constitutional: She is oriented to person, place, and time. She appears well-developed and well-nourished.  Non-toxic appearance. No distress.  HENT:  Head: Normocephalic and atraumatic.  Eyes: Conjunctivae, EOM and lids are normal. Pupils are equal, round, and reactive to light.  Neck: Normal range of motion. Neck supple. No tracheal deviation present. No thyroid mass present.  Cardiovascular: Normal rate, regular rhythm and normal heart sounds.  Exam reveals no gallop.   No murmur heard. Pulmonary/Chest: Effort normal. No stridor. No respiratory distress. She has decreased breath sounds. She has no wheezes. She has no rhonchi. She has no rales.  Abdominal: Soft. Normal appearance and bowel sounds are normal. She exhibits no distension. There is no tenderness. There is no rebound and no CVA tenderness.  Musculoskeletal: Normal range of motion. She exhibits no edema or tenderness.  Neurological: She is alert and oriented to person, place, and time. She has normal strength. No cranial nerve deficit or sensory deficit. GCS eye subscore is 4. GCS verbal subscore is 5. GCS motor subscore is 6.  Skin: Skin is warm and dry. No abrasion and no rash noted. Rash is not urticarial.  Psychiatric: Her speech is normal and behavior is normal. Her mood appears anxious.  Nursing note and vitals reviewed.   ED Course  Procedures (including critical care time) Labs Review Labs Reviewed - No data to display  Imaging Review No results found. I have personally reviewed and evaluated these images and lab results as part of my medical decision-making.   EKG Interpretation None      MDM   Final diagnoses:  None    Patient presented and extremities has a very distant. She had no signs of stridor. Was given Benadryl 50 mg  IM along with Decadron IV. She had no evidence of rash on her skin. She had no tongue swelling. Once the patient is able to relax her breathing greatly improved. She was monitored here and has no signs of airway obstructions. Will be given a refill for her EpiPen as well as an additional dose of Benadryl prior to discharge   CRITICAL CARE Performed by: Toy Baker Total critical care time: 60 minutes Critical care time was exclusive of separately billable procedures and treating other patients. Critical care was necessary to treat or prevent imminent or life-threatening deterioration. Critical care was time spent personally by me on the following activities: development of treatment plan with patient and/or surrogate as well as nursing, discussions with consultants, evaluation of patient's response to treatment, examination of patient, obtaining history from patient or surrogate,  ordering and performing treatments and interventions, ordering and review of laboratory studies, ordering and review of radiographic studies, pulse oximetry and re-evaluation of patient's condition.     Lorre NickAnthony Zamarion Longest, MD 08/21/15 [phone]

## 2015-08-21 NOTE — ED Notes (Signed)
Pt ambulated to the restroom with assistance.  

## 2015-08-21 NOTE — ED Notes (Signed)
Pt noted to have a hoarse voice at this time.

## 2015-08-21 NOTE — Discharge Instructions (Signed)
Use Benadryl as directed for the next 24-48 hours. Also use Pepcid as directed for the same amount of time. Return here for any trouble breathing Allergies An allergy is an abnormal reaction to a substance by the body's defense system (immune system). Allergies can develop at any age. WHAT CAUSES ALLERGIES? An allergic reaction happens when the immune system mistakenly reacts to a normally harmless substance, called an allergen, as if it were harmful. The immune system releases antibodies to fight the substance. Antibodies eventually release a chemical called histamine into the bloodstream. The release of histamine is meant to protect the body from infection, but it also causes discomfort. An allergic reaction can be triggered by:  Eating an allergen.  Inhaling an allergen.  Touching an allergen. WHAT TYPES OF ALLERGIES ARE THERE? There are many types of allergies. Common types include:  Seasonal allergies. People with this type of allergy are usually allergic to substances that are only present during certain seasons, such as molds and pollens.  Food allergies.  Drug allergies.  Insect allergies.  Animal dander allergies. WHAT ARE SYMPTOMS OF ALLERGIES? Possible allergy symptoms include:  Swelling of the lips, face, tongue, mouth, or throat.  Sneezing, coughing, or wheezing.  Nasal congestion.  Tingling in the mouth.  Rash.  Itching.  Itchy, red, swollen areas of skin (hives).  Watery eyes.  Vomiting.  Diarrhea.  Dizziness.  Lightheadedness.  Fainting.  Trouble breathing or swallowing.  Chest tightness.  Rapid heartbeat. HOW ARE ALLERGIES DIAGNOSED? Allergies are diagnosed with a medical and family history and one or more of the following:  Skin tests.  Blood tests.  A food diary. A food diary is a record of all the foods and drinks you have in a day and of all the symptoms you experience.  The results of an elimination diet. An elimination diet  involves eliminating foods from your diet and then adding them back in one by one to find out if a certain food causes an allergic reaction. HOW ARE ALLERGIES TREATED? There is no cure for allergies, but allergic reactions can be treated with medicine. Severe reactions usually need to be treated at a hospital. HOW CAN REACTIONS BE PREVENTED? The best way to prevent an allergic reaction is by avoiding the substance you are allergic to. Allergy shots and medicines can also help prevent reactions in some cases. People with severe allergic reactions may be able to prevent a life-threatening reaction called anaphylaxis with a medicine given right after exposure to the allergen.   This information is not intended to replace advice given to you by your health care provider. Make sure you discuss any questions you have with your health care provider.   Document Released: 08/09/2002 Document Revised: 06/06/2014 Document Reviewed: 02/25/2014 Elsevier Interactive Patient Education [employer]2016 Elsevier Inc.

## 2015-08-21 NOTE — ED Notes (Signed)
Pt states she was at a restaurant and te smell of the food triggered an allergic reaction. Pt is having SOB  And has a history of intubation for similar

## 2015-09-10 ENCOUNTER — Encounter (HOSPITAL_COMMUNITY): Payer: Self-pay

## 2015-09-10 ENCOUNTER — Emergency Department (HOSPITAL_COMMUNITY)
Admission: EM | Admit: 2015-09-10 | Discharge: 2015-09-10 | Disposition: A | Discharge status: Home / Self Care | Payer: Managed Care, Other (non HMO) | Attending: Emergency Medicine | Admitting: Emergency Medicine

## 2015-09-10 DIAGNOSIS — Z872 Personal history of diseases of the skin and subcutaneous tissue: Secondary | ICD-10-CM | POA: Insufficient documentation

## 2015-09-10 DIAGNOSIS — J45901 Unspecified asthma with (acute) exacerbation: Secondary | ICD-10-CM | POA: Diagnosis not present

## 2015-09-10 DIAGNOSIS — Z79899 Other long term (current) drug therapy: Secondary | ICD-10-CM | POA: Diagnosis not present

## 2015-09-10 DIAGNOSIS — Z7951 Long term (current) use of inhaled steroids: Secondary | ICD-10-CM | POA: Diagnosis not present

## 2015-09-10 DIAGNOSIS — Z8639 Personal history of other endocrine, nutritional and metabolic disease: Secondary | ICD-10-CM | POA: Insufficient documentation

## 2015-09-10 DIAGNOSIS — Y9289 Other specified places as the place of occurrence of the external cause: Secondary | ICD-10-CM | POA: Insufficient documentation

## 2015-09-10 DIAGNOSIS — Y998 Other external cause status: Secondary | ICD-10-CM | POA: Diagnosis not present

## 2015-09-10 DIAGNOSIS — Z8669 Personal history of other diseases of the nervous system and sense organs: Secondary | ICD-10-CM | POA: Diagnosis not present

## 2015-09-10 DIAGNOSIS — Y9389 Activity, other specified: Secondary | ICD-10-CM | POA: Diagnosis not present

## 2015-09-10 DIAGNOSIS — X58XXXA Exposure to other specified factors, initial encounter: Secondary | ICD-10-CM | POA: Diagnosis not present

## 2015-09-10 DIAGNOSIS — R21 Rash and other nonspecific skin eruption: Secondary | ICD-10-CM | POA: Diagnosis present

## 2015-09-10 DIAGNOSIS — Z8679 Personal history of other diseases of the circulatory system: Secondary | ICD-10-CM | POA: Insufficient documentation

## 2015-09-10 DIAGNOSIS — T7840XA Allergy, unspecified, initial encounter: Secondary | ICD-10-CM

## 2015-09-10 DIAGNOSIS — K219 Gastro-esophageal reflux disease without esophagitis: Secondary | ICD-10-CM | POA: Diagnosis not present

## 2015-09-10 HISTORY — DX: Unspecified asthma, uncomplicated: J45.909

## 2015-09-10 MED ORDER — PREDNISONE 20 MG PO TABS
ORAL_TABLET | ORAL | Status: DC
Start: 2015-09-10 — End: 2015-10-01

## 2015-09-10 MED ORDER — DIPHENHYDRAMINE HCL 50 MG/ML IJ SOLN
25.0000 mg | Freq: Once | INTRAMUSCULAR | Status: AC
  Administered 2015-09-10: 25 mg via INTRAVENOUS
  Filled 2015-09-10: qty 1

## 2015-09-10 MED ORDER — RANITIDINE HCL 150 MG/10ML PO SYRP
150.0000 mg | ORAL_SOLUTION | Freq: Once | ORAL | Status: AC
  Administered 2015-09-10: 150 mg via ORAL
  Filled 2015-09-10: qty 10

## 2015-09-10 MED ORDER — METHYLPREDNISOLONE SODIUM SUCC 125 MG IJ SOLR
125.0000 mg | Freq: Once | INTRAMUSCULAR | Status: AC
  Administered 2015-09-10: 125 mg via INTRAVENOUS
  Filled 2015-09-10: qty 2

## 2015-09-10 MED ORDER — SODIUM CHLORIDE 0.9 % IV BOLUS (SEPSIS)
1000.0000 mL | Freq: Once | INTRAVENOUS | Status: AC
  Administered 2015-09-10: 1000 mL via INTRAVENOUS

## 2015-09-10 NOTE — ED Notes (Signed)
Per EMS, Pt, from home, c/o allergic reaction to an unknown substance.  Pt reports that she was around people that were cooking.  Hx of multiple allergies.  Pt reports that her throat was getting tight and she self-administered an epi-pen and a "cup" of Benadryl.  Currently, denying complaints.  NAD noted.

## 2015-09-10 NOTE — ED Provider Notes (Signed)
CSN: [id number]     Arrival date & time 09/10/15  1521 History   First MD Initiated Contact with Patient 09/10/15 1539     Chief Complaint  Patient presents with  . Allergic Reaction     (Consider location/radiation/quality/duration/timing/severity/associated sxs/prior Treatment) Patient is a 50 y.o. female presenting with allergic reaction. The history is provided by the patient and medical records.  Allergic Reaction Presenting symptoms: difficulty breathing, itching, rash and swelling   Difficulty breathing:    Severity:  Mild   Onset quality:  Gradual Severity:  Mild Prior allergic episodes:  Food/nut allergies Context: no animal exposure and no grass   Relieved by:  None tried Worsened by:  Nothing tried Ineffective treatments:  None tried   Past Medical History  Diagnosis Date  . GERD   . HYPERLIPIDEMIA, WITH LOW HDL   . POSITIVE PPD 1987    s/p 21mo tx  . MIGRAINE HEADACHE   . PERIPHERAL NEUROPATHY   . 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 Hives of unknown origin     chronic and recurrent urticaria since 01/2012  . Asthma    Past Surgical History  Procedure Laterality Date  . Appendectomy  1992  . Abdominal hysterectomy  1996  . Cesarean section  1989  . Cholecystectomy  04/22/2011    Procedure: LAPAROSCOPIC CHOLECYSTECTOMY WITH INTRAOPERATIVE CHOLANGIOGRAM;  Surgeon: Almond Lint, MD;  Location: [facility] OR;  Service: General;  Laterality: N/A;   Family History  Problem Relation Age of Onset  . Diabetes Mother   . Prostate cancer Father   . Hypertension Father   . Heart disease Father   . Diabetes Brother   . Alcohol abuse Other   . Arthritis Other   . Hypertension Other    Social History  Substance Use Topics  . Smoking status: Never Smoker   . Smokeless tobacco: Never Used  . Alcohol Use: No   OB History    Gravida Para Term Preterm AB TAB SAB Ectopic Multiple Living   Review of Systems   Constitutional: Negative for fever.  HENT: Negative for congestion and facial swelling.   Eyes: Negative for discharge and redness.  Respiratory: Positive for shortness of breath. Negative for cough.   Cardiovascular: Negative for chest pain.  Gastrointestinal: Negative for abdominal pain and abdominal distention.  Endocrine: Negative for polydipsia.  Genitourinary: Negative for dysuria.  Musculoskeletal: Negative for back pain.  Skin: Positive for itching and rash. Negative for wound.  Neurological: Negative for headaches.  All other systems reviewed and are negative.     Allergies  Other; Peanuts; Corn dextrin; Gluten meal; Shellfish allergy; Soy allergy; Sunflower oil; Tomato; Wheat bran; and Morphine  Home Medications   Prior to Admission medications   Medication Sig Start Date End Date Taking? Authorizing Provider  albuterol (PROVENTIL) (2.5 MG/3ML) 0.083% nebulizer solution Take 3 mLs (2.5 mg total) by nebulization every 6 (six) hours as needed for wheezing or shortness of breath. 02/11/15   Waymon Budge, MD  Albuterol Sulfate (PROAIR RESPICLICK) 108 (90 BASE) MCG/ACT AEPB Inhale 1-2 puffs into the lungs every 6 (six) hours. Patient not taking: Reported on 08/21/2015 04/10/14   Etta Grandchild, MD  brimonidine (ALPHAGAN) 0.15 % ophthalmic solution Place 1 drop into both eyes 2 (two) times daily.    Historical Provider, MD  budesonide-formoterol (SYMBICORT) 80-4.5 MCG/ACT inhaler Inhale 2  puffs into the lungs 2 (two) times daily. 07/23/14   Newt Lukes, MD  diphenhydrAMINE (BENADRYL) 25 MG tablet Take 1 tablet (25 mg total) by mouth every 6 (six) hours. X 3 days then PRN itching, allergic reaction 07/14/15   Trixie Dredge, PA-C  EPINEPHrine 0.3 mg/0.3 mL IJ SOAJ injection Inject 0.3 mLs (0.3 mg total) into the muscle once. 08/21/15   Lorre Nick, MD  famotidine (PEPCID) 20 MG tablet Take 1 tablet (20 mg total) by mouth 2 (two) times daily. X 3 days then PRN allergic reaction  07/14/15   Trixie Dredge, PA-C  fexofenadine (ALLEGRA) 180 MG tablet Take 180 mg by mouth daily.    Historical Provider, MD  fluticasone (FLONASE) 50 MCG/ACT nasal spray Place 2 sprays into both nostrils daily. Patient taking differently: Place 2 sprays into both nostrils daily as needed for allergies.  07/27/15   Myrlene Broker, MD  ibuprofen (ADVIL,MOTRIN) 200 MG tablet Take 400 mg by mouth every 6 (six) hours as needed for fever, headache, mild pain, moderate pain or cramping.    Historical Provider, MD  Nebulizers (COMPRESSOR/NEBULIZER) MISC As directed Patient not taking: Reported on 08/21/2015 02/11/15   Waymon Budge, MD  predniSONE (DELTASONE) 20 MG tablet 3 tabs po daily x 3 days, then 2 tabs x 3 days, then 1.5 tabs x 3 days, then 1 tab x 3 days, then 0.5 tabs x 3 days 09/10/15   Marily Memos, MD  Pyridoxine HCl (B-6 PO) Take 1 tablet by mouth daily at 12 noon.    Historical Provider, MD   BP 134/94 mmHg  Pulse 85  Temp(Src) 98.6 F (37 C) (Oral)  Resp 16  SpO2 99% Physical Exam  Constitutional: She is oriented to person, place, and time. She appears well-developed and well-nourished.  HENT:  Head: Normocephalic and atraumatic.  Neck: Normal range of motion.  Cardiovascular: Normal rate and regular rhythm.   Pulmonary/Chest: No stridor. No respiratory distress.  Abdominal: Soft. She exhibits no distension. There is no tenderness.  Neurological: She is alert and oriented to person, place, and time. No cranial nerve deficit. Coordination normal.  Skin: Skin is warm and dry. Rash noted. Erythema: erythema to right shoulder with pruritis and excoriation.  Nursing note and vitals reviewed.   ED Course  Procedures (including critical care time) Labs Review Labs Reviewed - No data to display  Imaging Review No results found. I have personally reviewed and evaluated these images and lab results as part of my medical decision-making.   EKG Interpretation None      MDM    Final diagnoses:  Allergic reaction, initial encounter   Allergic reaction, subjective feeling of throat closing, improved now after epi and benadryl. Will give steroids, repeat benadryl (still with itching), zantac and fluids with observation.  No worsening of symptoms. Still with slightly hoarse voice, which is normal for her after these episodes. Will speak with allergist regarding repeat reaction.   New Prescriptions: Discharge Medication List as of 09/10/2015  7:06 PM     I have personally and contemperaneously reviewed labs and imaging and used in my decision making as above.   A medical screening exam was performed and I feel the patient has had an appropriate workup for their chief complaint at this time and likelihood of emergent condition existing is low. Their vital signs are stable. They have been counseled on decision, discharge, follow up and which symptoms necessitate immediate return to the emergency department.  They verbally  stated understanding and agreement with plan and discharged in stable condition.       Marily MemosJason Tashea Othman, MD 09/11/15 [phone]

## 2015-10-01 ENCOUNTER — Emergency Department (HOSPITAL_COMMUNITY)
Admission: EM | Admit: 2015-10-01 | Discharge: 2015-10-01 | Disposition: A | Discharge status: Home / Self Care | Payer: Managed Care, Other (non HMO) | Attending: Emergency Medicine | Admitting: Emergency Medicine

## 2015-10-01 ENCOUNTER — Encounter (HOSPITAL_COMMUNITY): Payer: Self-pay | Admitting: Emergency Medicine

## 2015-10-01 DIAGNOSIS — Z91010 Allergy to peanuts: Secondary | ICD-10-CM | POA: Diagnosis not present

## 2015-10-01 DIAGNOSIS — T7809XA Anaphylactic reaction due to other food products, initial encounter: Secondary | ICD-10-CM | POA: Insufficient documentation

## 2015-10-01 DIAGNOSIS — G629 Polyneuropathy, unspecified: Secondary | ICD-10-CM | POA: Diagnosis not present

## 2015-10-01 DIAGNOSIS — Z7951 Long term (current) use of inhaled steroids: Secondary | ICD-10-CM | POA: Insufficient documentation

## 2015-10-01 DIAGNOSIS — T7840XA Allergy, unspecified, initial encounter: Secondary | ICD-10-CM

## 2015-10-01 DIAGNOSIS — E785 Hyperlipidemia, unspecified: Secondary | ICD-10-CM | POA: Diagnosis not present

## 2015-10-01 DIAGNOSIS — Z79899 Other long term (current) drug therapy: Secondary | ICD-10-CM | POA: Diagnosis not present

## 2015-10-01 DIAGNOSIS — Z791 Long term (current) use of non-steroidal anti-inflammatories (NSAID): Secondary | ICD-10-CM | POA: Diagnosis not present

## 2015-10-01 DIAGNOSIS — J45909 Unspecified asthma, uncomplicated: Secondary | ICD-10-CM | POA: Insufficient documentation

## 2015-10-01 DIAGNOSIS — T781XXA Other adverse food reactions, not elsewhere classified, initial encounter: Secondary | ICD-10-CM | POA: Diagnosis present

## 2015-10-01 DIAGNOSIS — Z7952 Long term (current) use of systemic steroids: Secondary | ICD-10-CM | POA: Diagnosis not present

## 2015-10-01 DIAGNOSIS — K219 Gastro-esophageal reflux disease without esophagitis: Secondary | ICD-10-CM | POA: Diagnosis not present

## 2015-10-01 DIAGNOSIS — L509 Urticaria, unspecified: Secondary | ICD-10-CM | POA: Insufficient documentation

## 2015-10-01 MED ORDER — FAMOTIDINE 20 MG PO TABS: 20.0000 mg | ORAL_TABLET | Freq: Two times a day (BID) | ORAL | Status: DC

## 2015-10-01 MED ORDER — FAMOTIDINE IN NACL 20-0.9 MG/50ML-% IV SOLN
20.0000 mg | Freq: Once | INTRAVENOUS | Status: AC
  Administered 2015-10-01: 20 mg via INTRAVENOUS
  Filled 2015-10-01: qty 50

## 2015-10-01 MED ORDER — METHYLPREDNISOLONE SODIUM SUCC 125 MG IJ SOLR
125.0000 mg | Freq: Once | INTRAMUSCULAR | Status: AC
  Administered 2015-10-01: 125 mg via INTRAVENOUS
  Filled 2015-10-01: qty 2

## 2015-10-01 MED ORDER — PREDNISONE 20 MG PO TABS: 40.0000 mg | ORAL_TABLET | Freq: Every day | ORAL | Status: DC

## 2015-10-01 NOTE — ED Notes (Signed)
Discharge instructions, follow up care, and rx x2 reviewed with patient. Patient verbalized understanding. 

## 2015-10-01 NOTE — ED Provider Notes (Addendum)
CSN: [id number]     Arrival date & time 10/01/15  1356 History   First MD Initiated Contact with Patient 10/01/15 1407     Chief Complaint  Patient presents with  . Allergic Reaction     (Consider location/radiation/quality/duration/timing/severity/associated sxs/prior Treatment) Patient is a 50 y.o. female presenting with allergic reaction. The history is provided by the patient.  Allergic Reaction Presenting symptoms: difficulty breathing, difficulty swallowing, itching, swelling and wheezing   Severity:  Severe Prior allergic episodes:  Food/nut allergies Context: food   Context comment:  Her sister was cooking something in the house today made of spices that she has known allergy to and she went in and started smelling it and developed symptoms Relieved by:  Epinephrine Worsened by:  Nothing tried Ineffective treatments:  None tried   Past Medical History  Diagnosis Date  . GERD   . HYPERLIPIDEMIA, WITH LOW HDL   . POSITIVE PPD 1987    s/p 56mo tx  . MIGRAINE HEADACHE   . PERIPHERAL NEUROPATHY   . 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 Hives of unknown origin     chronic and recurrent urticaria since 01/2012  . Asthma    Past Surgical History  Procedure Laterality Date  . Appendectomy  1992  . Abdominal hysterectomy  1996  . Cesarean section  1989  . Cholecystectomy  04/22/2011    Procedure: LAPAROSCOPIC CHOLECYSTECTOMY WITH INTRAOPERATIVE CHOLANGIOGRAM;  Surgeon: Almond Lint, MD;  Location: [facility] OR;  Service: General;  Laterality: N/A;   Family History  Problem Relation Age of Onset  . Diabetes Mother   . Prostate cancer Father   . Hypertension Father   . Heart disease Father   . Diabetes Brother   . Alcohol abuse Other   . Arthritis Other   . Hypertension Other    Social History  Substance Use Topics  . Smoking status: Never Smoker   . Smokeless tobacco: Never Used  . Alcohol Use: No   OB History    Gravida Para Term  Preterm AB TAB SAB Ectopic Multiple Living   Review of Systems  HENT: Positive for trouble swallowing.   Respiratory: Positive for wheezing.   Skin: Positive for itching.  All other systems reviewed and are negative.     Allergies  Other; Peanuts; Corn dextrin; Gluten meal; Shellfish allergy; Soy allergy; Sunflower oil; Tomato; Wheat bran; and Morphine  Home Medications   Prior to Admission medications   Medication Sig Start Date End Date Taking? Authorizing Provider  albuterol (PROVENTIL) (2.5 MG/3ML) 0.083% nebulizer solution Take 3 mLs (2.5 mg total) by nebulization every 6 (six) hours as needed for wheezing or shortness of breath. 02/11/15   Waymon Budge, MD  Albuterol Sulfate (PROAIR RESPICLICK) 108 (90 BASE) MCG/ACT AEPB Inhale 1-2 puffs into the lungs every 6 (six) hours. Patient not taking: Reported on 08/21/2015 04/10/14   Etta Grandchild, MD  brimonidine (ALPHAGAN) 0.15 % ophthalmic solution Place 1 drop into both eyes 2 (two) times daily.    Historical Provider, MD  budesonide-formoterol (SYMBICORT) 80-4.5 MCG/ACT inhaler Inhale 2 puffs into the lungs 2 (two) times daily. 07/23/14   Newt Lukes, MD  diphenhydrAMINE (BENADRYL) 25 MG tablet Take 1 tablet (25 mg total) by mouth every 6 (six) hours. X 3 days then PRN itching, allergic reaction 07/14/15   Trixie Dredge, PA-C  EPINEPHrine  0.3 mg/0.3 mL IJ SOAJ injection Inject 0.3 mLs (0.3 mg total) into the muscle once. 08/21/15   Lorre NickAnthony Allen, MD  famotidine (PEPCID) 20 MG tablet Take 1 tablet (20 mg total) by mouth 2 (two) times daily. X 3 days then PRN allergic reaction 07/14/15   Trixie DredgeEmily West, PA-C  fexofenadine (ALLEGRA) 180 MG tablet Take 180 mg by mouth daily.    Historical Provider, MD  fluticasone (FLONASE) 50 MCG/ACT nasal spray Place 2 sprays into both nostrils daily. Patient taking differently: Place 2 sprays into both nostrils daily as needed for allergies.  07/27/15   Myrlene BrokerElizabeth A Crawford, MD   ibuprofen (ADVIL,MOTRIN) 200 MG tablet Take 400 mg by mouth every 6 (six) hours as needed for fever, headache, mild pain, moderate pain or cramping.    Historical Provider, MD  Nebulizers (COMPRESSOR/NEBULIZER) MISC As directed Patient not taking: Reported on 08/21/2015 02/11/15   Waymon Budgelinton D Young, MD  predniSONE (DELTASONE) 20 MG tablet 3 tabs po daily x 3 days, then 2 tabs x 3 days, then 1.5 tabs x 3 days, then 1 tab x 3 days, then 0.5 tabs x 3 days 09/10/15   Marily MemosJason Mesner, MD  Pyridoxine HCl (B-6 PO) Take 1 tablet by mouth daily at 12 noon.    Historical Provider, MD   BP 127/86 mmHg  Pulse 86  Temp(Src) 98.5 F (36.9 C) (Oral)  Resp 20  Wt 231 lb (104.781 kg)  SpO2 96% Physical Exam  Constitutional: She is oriented to person, place, and time. She appears well-developed and well-nourished. No distress.  HENT:  Head: Normocephalic and atraumatic.  Mouth/Throat: Oropharynx is clear and moist.  Eyes: Conjunctivae and EOM are normal. Pupils are equal, round, and reactive to light.  Neck: Normal range of motion. Neck supple.  Hoarse voice without stridor  Cardiovascular: Normal rate, regular rhythm and intact distal pulses.   No murmur heard. Pulmonary/Chest: Effort normal and breath sounds normal. No respiratory distress. She has no wheezes. She has no rales.  Abdominal: Soft. She exhibits no distension. There is no tenderness. There is no rebound and no guarding.  Musculoskeletal: Normal range of motion. She exhibits no edema or tenderness.  Neurological: She is alert and oriented to person, place, and time.  Skin: Skin is warm and dry. No rash noted. No erythema.  Psychiatric: She has a normal mood and affect. Her behavior is normal.  Nursing note and vitals reviewed.   ED Course  Procedures (including critical care time) Labs Review Labs Reviewed - No data to display  Imaging Review No results found. I have personally reviewed and evaluated these images and lab results as part  of my medical decision-making.   EKG Interpretation None      MDM   Final diagnoses:  Allergic reaction, initial encounter  Patient is a 50 year old female presenting here today after an anaphylactic reaction to spaces that her sister was cooking. She went into her sister's house and started snoring the phone and suddenly developed swelling of her throat, shortness of breath and diffuse itching. She used her EpiPen prior to EMS arrival. Initially when they arrived she was stridorous which is now resolved. She received albuterol, Benadryl and Zofran in route. She still has a hoarse voice but the stridor is gone. No obvious tongue or pharyngeal swelling. Patient given Solu-Medrol, Pepcid and will observe.  4:57 PM On reevaluation patient still has a minor hoarse voice but denies any recurrent throat swelling, shortness of breath or itching. She states in  the past when she's had reactions like this it takes several days to completely resolve. She feels comfortable going home.  Gwyneth Sprout, MD 10/01/15 1659  Gwyneth Sprout, MD 10/01/15 1701

## 2015-10-01 NOTE — ED Notes (Signed)
Bed: [id number] Expected date:  Expected time:  Means of arrival:  Comments: Allergic reaction to 25

## 2015-10-01 NOTE — Discharge Instructions (Signed)

## 2015-10-01 NOTE — ED Notes (Signed)
Per EMS, patient had an allergic reaction today at home while family was cooking (known allergy to spices) Stridor & wheezing - patient used her own epi pen prior to EMS arrival 5 mg albuterol, 50 benadryl IV, 4 zofran IV Stridor is gone per EMS. Patient is complaining of scratchy throat Hx of asthma.

## 2015-10-01 NOTE — ED Notes (Signed)
MD at bedside. 

## 2016-02-08 ENCOUNTER — Ambulatory Visit (INDEPENDENT_AMBULATORY_CARE_PROVIDER_SITE_OTHER): Payer: Managed Care, Other (non HMO) | Admitting: Internal Medicine

## 2016-02-08 ENCOUNTER — Encounter: Payer: Self-pay | Admitting: Internal Medicine

## 2016-02-08 DIAGNOSIS — T7840XA Allergy, unspecified, initial encounter: Secondary | ICD-10-CM

## 2016-02-08 MED ORDER — PREDNISONE 20 MG PO TABS: 40.0000 mg | ORAL_TABLET | Freq: Every day | ORAL | 0 refills | Status: DC

## 2016-02-08 NOTE — Patient Instructions (Signed)
We have sent in prednisone for the allergic reaction. Take 2 pills daily for the next week.   We would like you to go back to see the allergy specialist and avoid dairy foods until that time.    Food Allergy A food allergy is an abnormal reaction to a food (food allergen) by the body's defense system (immune system). Foods that commonly cause allergies are:  Milk.  Seafood.  Eggs.  Nuts.  Wheat.  Soy. CAUSES Food allergies happen when the immune system mistakenly sees a food as harmful and releases antibodies to fight it. SIGNS AND SYMPTOMS Symptoms may be mild or severe. They usually start minutes after the food is eaten, but they can occur even a few hours later. In people with a severe allergy, symptoms can start within seconds. Mild Symptoms  Nasal congestion.  Tingling in the mouth.  An itchy, red rash.  Vomiting.  Diarrhea. Severe Symptoms  Swelling of the lips, face, and tongue.  Swelling of the back of the mouth and throat.  Wheezing.  A hoarse voice.  Itchy, red, swollen areas of skin (hives).  Dizziness or light-headedness.  Fainting.  Trouble breathing, speaking, or swallowing.  Chest tightness.  Rapid heartbeat. DIAGNOSIS A diagnosis is made with a physical exam, medical and family history, and one or more of the following:  Skin tests.  Blood tests.  A food diary.  The results of an elimination diet. The elimination diet involves removing foods from your diet and then adding them back in, one at a time. TREATMENT There is no cure for allergies. An allergic reaction can be treated with medicines, such as:  Antihistamines.  Steroids.  Respiratory inhalers.  Epinephrine. Severe symptoms can be a sign of a life-threatening reaction called anaphylaxis, and they require immediate treatment. Severe reactions usually need to be treated at a hospital. People who have had a severe reaction may be prescribed rescue medicines to take if they  are accidentally exposed to an allergen. HOME CARE INSTRUCTIONS General Instructions  Avoid the foods that you are allergic to.  Read food labels before you eat packaged items. Look for ingredients you are allergic to.  When you are at a restaurant, tell your server that you have an allergy. If you are unsure of whether a meal has an ingredient that you are allergic to, ask your server.  Take medicines only as directed by your health care provider. Do not drive until the medicine has worn off, unless your health care provider gives you approval.  Inform all health care providers that you have a food allergy.  Contact your health care provider if you want to be tested for an allergy. If you have had an anaphylactic reaction before, you should never test yourself for an allergy without your health care provider's approval. Instructions for People with Severe Allergies  Wear a medical alert bracelet or necklace that describes your allergy.  Carry your anaphylaxis kit or an epinephrine injection with you at all times. Use them as directed by your health care provider.  Make sure that you, your family members, and your employer know:  How to use an anaphylaxis kit.  How to give an epinephrine injection.  Replace your epinephrine immediately after use, in case you have another reaction.  Seek medical care even after you take epinephrine. This is important because epinephrine can be followed by a delayed, life-threatening reaction. Instructions for People with a Potential Allergy  Follow the elimination diet as directed by your  health care provider.  Keep a food diary as directed by your health care provider. Every day, write down:  What you eat and drink and when.  What symptoms you have and when. SEEK MEDICAL CARE IF:  Your symptoms have not gone away within 2 days.  Your symptoms get worse.  You develop new symptoms. SEEK IMMEDIATE MEDICAL CARE IF:  You use  epinephrine.  You are having a severe allergic reaction. Symptoms of a severe reaction include:  Swelling of the lips, face, and tongue.  Swelling of the back of the mouth and throat.  Wheezing.  A hoarse voice.  Hives.  Dizziness or light-headedness.  Fainting.  Trouble breathing, speaking, or swallowing.  Chest tightness.  Rapid heartbeat.   This information is not intended to replace advice given to you by your health care provider. Make sure you discuss any questions you have with your health care provider.   Document Released: 05/13/2000 Document Revised: 06/06/2014 Document Reviewed: 02/25/2014 Elsevier Interactive Patient Education [employer].

## 2016-02-08 NOTE — Progress Notes (Signed)
Subjective:    Patient ID: Megan Wilkerson, female    DOB: 1966-04-18, 50 y.o.   MRN: [id number]  HPI The patient is a 50 YO female coming in for allergy to food. She was eating chicken and a piece of colby cheese on Sunday and symptoms started shortly thereafter the cheese. 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 She immediately got swelling in her throat and itching and hives. She did not take her epi-pen which she has. She did start taking benadryl and allegra and pepcid which have helped slightly. Some SOB and wheezing with walking. She has used her albuterol inhaler which helps some. She is still having itching today but the rash is gone. She is still hoarse and mild SOB with activity. Ate some cereal with milk this morning and had some worsening of her symptoms. Denies known dairy allergy or prior reaction to dairy and she eats a lot of dairy.   Review of Systems  Constitutional: Positive for appetite change. Negative for activity change, chills, fatigue, fever and unexpected weight change.  HENT: Positive for sore throat and voice change. Negative for congestion, dental problem, postnasal drip, rhinorrhea, sinus pressure and trouble swallowing.   Eyes: Negative.   Respiratory: Positive for shortness of breath. Negative for cough, chest tightness and wheezing.   Cardiovascular: Negative for chest pain, palpitations and leg swelling.  Gastrointestinal: Negative for abdominal distention, abdominal pain, constipation, diarrhea and nausea.  Musculoskeletal: Negative.   Skin: Positive for rash.       itching  Neurological: Negative.   Psychiatric/Behavioral: Negative.       Objective:   Physical Exam  Constitutional: She is oriented to person, place, and time. She appears well-developed and well-nourished.  Overweight  HENT:  Head: Normocephalic and atraumatic.  Eyes: EOM  are normal.  No swelling in the face or mouth, redness at the back of the throat  Neck: Normal range of motion.  Cardiovascular: Normal rate and regular rhythm.   Pulmonary/Chest: Effort normal and breath sounds normal. No respiratory distress. She has no wheezes. She has no rales.  Abdominal: Soft. Bowel sounds are normal. She exhibits no distension. There is no tenderness. There is no rebound.  Musculoskeletal: She exhibits no edema.  Neurological: She is alert and oriented to person, place, and time. Coordination normal.  Skin: Skin is warm and dry.  No rash on exam  Psychiatric: She has a normal mood and affect.   Vitals:   02/08/16 1348  BP: 110/70  Pulse: 80  Resp: 16  Temp: 98.4 F (36.9 C)  TempSrc: Oral  SpO2: 98%  Weight: 226 lb (102.5 kg)  Height: 5\' 6"  (1.676 m)       Assessment & Plan:

## 2016-02-08 NOTE — Progress Notes (Signed)
Pre visit review using our clinic review tool, if applicable. No additional management support is needed unless otherwise documented below in the visit note. 

## 2016-02-08 NOTE — Assessment & Plan Note (Signed)
She had a reaction after eating dairy product (cheese) and rx for prednisone today. She is taking benadryl and anti-histamine medication. She will return to her allergy specialist for testing and is advised to avoid dairy products for now. She has epi-pen and with her and she was advised if this happens again to use epi-pen.

## 2016-02-11 ENCOUNTER — Telehealth: Payer: Self-pay | Admitting: Internal Medicine

## 2016-02-11 DIAGNOSIS — Z91018 Allergy to other foods: Secondary | ICD-10-CM

## 2016-02-11 NOTE — Telephone Encounter (Signed)
I called patient to r/s her yearly followup and she is sick and is questioning when she can come in to see CY -pr

## 2016-02-11 NOTE — Telephone Encounter (Signed)
Pt called in. She saw PCP and they are requesting for pt to see Dr. Maple HudsonYoung soon. Pt has had an allergic reaction and is requesting testing to be done for this. Pt wants to be worked in ASAP with Dr. Maple HudsonYoung. Please advise thanks

## 2016-02-11 NOTE — Telephone Encounter (Signed)
Spoke with pt. She is needing an appointment with CY ASAP. While speaking to her, the pt does not have a voice and is hard to understand. Stated, "I can't wait much longer to see Dr. Maple HudsonYoung."  Florentina AddisonKatie- please advise when pt can be seen. Thanks.

## 2016-02-12 NOTE — Telephone Encounter (Signed)
Megan AddisonKatie- Ok to work her in with me, but since I am slowing down, and her primary issue has been food allergy, perhaps she would do better to see one of the other allergy groups.

## 2016-02-12 NOTE — Telephone Encounter (Signed)
LMOMTCB x 1 

## 2016-02-15 ENCOUNTER — Ambulatory Visit (INDEPENDENT_AMBULATORY_CARE_PROVIDER_SITE_OTHER): Payer: Managed Care, Other (non HMO) | Admitting: Family Medicine

## 2016-02-15 ENCOUNTER — Encounter: Payer: Self-pay | Admitting: Student

## 2016-02-15 ENCOUNTER — Telehealth: Payer: Self-pay | Admitting: Internal Medicine

## 2016-02-15 VITALS — BP 122/78 | HR 96 | Resp 12 | Ht 66.0 in | Wt 224.1 lb

## 2016-02-15 DIAGNOSIS — J4541 Moderate persistent asthma with (acute) exacerbation: Secondary | ICD-10-CM

## 2016-02-15 DIAGNOSIS — J040 Acute laryngitis: Secondary | ICD-10-CM | POA: Diagnosis not present

## 2016-02-15 MED ORDER — IPRATROPIUM-ALBUTEROL 0.5-2.5 (3) MG/3ML IN SOLN
3.0000 mL | Freq: Once | RESPIRATORY_TRACT | Status: AC
  Administered 2016-02-15: 3 mL via RESPIRATORY_TRACT

## 2016-02-15 MED ORDER — HYDROXYZINE HCL 25 MG PO TABS: 25.0000 mg | ORAL_TABLET | Freq: Three times a day (TID) | ORAL | 1 refills | Status: DC | PRN

## 2016-02-15 NOTE — Progress Notes (Signed)
Pre visit review using our clinic review tool, if applicable. No additional management support is needed unless otherwise documented below in the visit note. 

## 2016-02-15 NOTE — Telephone Encounter (Signed)
Spoke with pt. She is aware of CY's response. Pt would like to go ahead with the referral to another office. This referral has been placed. Nothing further was needed.

## 2016-02-15 NOTE — Progress Notes (Signed)
HPI:  ACUTE VISIT:  Chief Complaint  Patient presents with  . Asthma    [name] is a 50 y.o. female, who is here today complaining of about a week of respiratory symptoms, mainly wheezing, chest tightness with exertion, and non productive cough.  Symptoms are worse at night.  She is already on oral Prednisone, 40 mg daily (4th day), Albuterol neb q 6 hours, last one today at 12 noon, and on Benadryl.  Symptoms developed right after she ate a piece of cheese, she has multiple food allergies. She also noted pruritic skin rash,urticaria, which has resolved with Benadryl 25 mg.  She has not taken Allegra OTC because was instructed not to do so before appt with immunologists, 02/2016.  + Nasal congestion, rhinorrhea, sore throat, and post nasal drainage. She has not noted chills, fever, body aches. + Dysphonia, denies stridor.  No Hx of recent travel. Sick contact: No known insect bite. + Hx of allergies: asthma, food allergies, and allergic rhinitis. She is on Symbicort 80-4.5 mcg bid.   Symptoms otherwise stable, she is concerned because she has recovered faster in the past with similar episodes.  She follows with pulmonologist, Dr Maple HudsonYoung.   Review of Systems  Constitutional: Negative for activity change, appetite change, fatigue and fever.  HENT: Positive for congestion, postnasal drip, rhinorrhea, sore throat and voice change. Negative for ear pain, facial swelling, mouth sores, nosebleeds, sneezing and trouble swallowing.   Eyes: Positive for itching. Negative for discharge, redness and visual disturbance.  Respiratory: Positive for cough, chest tightness, shortness of breath and wheezing.   Cardiovascular: Negative for chest pain, palpitations and leg swelling.  Gastrointestinal: Negative for abdominal pain, diarrhea, nausea and vomiting.  Musculoskeletal: Negative for back pain, joint swelling, myalgias and neck pain.  Skin: Positive for rash. Negative  for pallor.  Allergic/Immunologic: Positive for environmental allergies and food allergies.  Neurological: Negative for syncope, weakness, numbness and headaches.  Hematological: Negative for adenopathy. Does not bruise/bleed easily.  Psychiatric/Behavioral: Negative for confusion. The patient is nervous/anxious.       Current Outpatient Prescriptions on File Prior to Visit  Medication Sig Dispense Refill  . albuterol (PROVENTIL) (2.5 MG/3ML) 0.083% nebulizer solution Take 3 mLs (2.5 mg total) by nebulization every 6 (six) hours as needed for wheezing or shortness of breath. 75 mL 12  . Albuterol Sulfate (PROAIR RESPICLICK) 108 (90 BASE) MCG/ACT AEPB Inhale 1-2 puffs into the lungs every 6 (six) hours. 1 each 11  . brimonidine (ALPHAGAN) 0.15 % ophthalmic solution Place 1 drop into both eyes 2 (two) times daily.    . budesonide-formoterol (SYMBICORT) 80-4.5 MCG/ACT inhaler Inhale 2 puffs into the lungs 2 (two) times daily. 1 Inhaler 11  . EPINEPHrine 0.3 mg/0.3 mL IJ SOAJ injection Inject 0.3 mLs (0.3 mg total) into the muscle once. 1 Device 2  . famotidine (PEPCID) 20 MG tablet Take 1 tablet (20 mg total) by mouth 2 (two) times daily. 6 tablet 0  . fexofenadine (ALLEGRA) 180 MG tablet Take 180 mg by mouth daily.    . fluticasone (FLONASE) 50 MCG/ACT nasal spray Place 2 sprays into both nostrils daily. (Patient taking differently: Place 2 sprays into both nostrils daily as needed for allergies. ) 16 g 6  . Nebulizers (COMPRESSOR/NEBULIZER) MISC As directed 1 each 0  . predniSONE (DELTASONE) 20 MG tablet Take 2 tablets (40 mg total) by mouth daily. 14 tablet 0  . Pyridoxine HCl (B-6 PO) Take 1 tablet by mouth daily  at 12 noon.     No current facility-administered medications on file prior to visit.      Past Medical History:  Diagnosis Date  . Anaphylactic reaction    several ED visits for same since 01/2012  . Asthma   . GERD   . Hives of unknown origin    chronic and recurrent  urticaria since 01/2012  . HYPERLIPIDEMIA, WITH LOW HDL   . MIGRAINE HEADACHE   . PERIPHERAL NEUROPATHY   . POSITIVE PPD 1987   s/p 52mo tx   Allergies  Allergen Reactions  . Other Anaphylaxis    Megan Wilkerson and paprika   . Peanuts [Peanut Oil] Anaphylaxis and Itching  . Corn Dextrin Other (See Comments)    Plain corn ok, popcorn causes hives  . Gluten Meal Other (See Comments)    cough  . Shellfish Allergy Itching  . Soy Allergy Other (See Comments)    coughing  . Sunflower Oil Itching  . Tomato Itching  . Wheat Bran     swelling  . Morphine Itching and Rash    Social History   Social History  . Marital status: Married    Spouse name: N/A  . Number of children: N/A  . Years of education: N/A   Social History Main Topics  . Smoking status: Never Smoker  . Smokeless tobacco: Never Used  . Alcohol use No  . Drug use: No  . Sexual activity: Not Asked   Other Topics Concern  . None   Social History Narrative   Married, lives with spouse, son and nephew   [place], 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 "peanut" and other anaphylaxis food allergies    Vitals:   02/15/16 1352  BP: 122/78  Pulse: 96  Resp: 12    O2 sat 97% at RA.  Body mass index is 36.17 kg/m.    Physical Exam  Nursing note and vitals reviewed. Constitutional: She is oriented to person, place, and time. She appears well-developed. She does not appear ill. No distress.  HENT:  Head: Atraumatic.  Right Ear: Tympanic membrane, external ear and ear canal normal.  Left Ear: Tympanic membrane, external ear and ear canal normal.  Nose: Rhinorrhea present. Right sinus exhibits no maxillary sinus tenderness and no frontal sinus tenderness. Left sinus exhibits no maxillary sinus tenderness and no frontal sinus tenderness.  Mouth/Throat: Oropharynx is clear and moist and mucous membranes are normal.  Hypertrophic turbinates. Postnasal drainage. Moderate dysphonia.  Eyes:  Conjunctivae and EOM are normal.  Neck: No muscular tenderness present. No edema and no erythema present.  Cardiovascular: Normal rate and regular rhythm.   No murmur heard. Respiratory: Effort normal and breath sounds normal. No stridor. No respiratory distress.  Lymphadenopathy:       Head (right side): No submandibular adenopathy present.       Head (left side): No submandibular adenopathy present.    She has no cervical adenopathy.  Neurological: She is alert and oriented to person, place, and time. She has normal strength. Coordination and gait normal.  Skin: Skin is warm. No rash noted. No erythema.  Psychiatric: Her mood appears anxious.  Well groomed, good eye contact.      ASSESSMENT AND PLAN:     Megan Wilkerson was seen today for asthma.  Diagnoses and all orders for this visit:  Asthma with exacerbation, moderate persistent -     ipratropium-albuterol (DUONEB) 0.5-2.5 (3) MG/3ML nebulizer solution 3 mL; Take 3 mLs  by nebulization once.  Laryngitis, acute  Other orders -     hydrOXYzine (ATARAX/VISTARIL) 25 MG tablet; Take 1 tablet (25 mg total) by mouth 3 (three) times daily as needed for itching.   She tolerated well DuoNeb treatment,  lung auscultation is negative with no rales, rhonchi, or wheezing. Continue Prednisone, 3 more days left, I do not think dose needs to be increased. Resume Allegra 180 mg or try Cetirizine 10 mg daily. Start Pepcid 20 mg bid. Hydralazine 25 mg qid as needed. Some side effects of medications discussed.  Symbicort to continue same dose. Continue Albuterol neb qid as needed for wheezing/dyspnea. Voice rest.  Avoid NSAID's. We discussed the options of having CXR done but she does not feel like it is necessary because no fever or chills. Clearly instructed about warning signs.     Return in about 1 week (around 02/22/2016) for asthma exacerbation with PCP.     -[name] was advised to return or notify a doctor  immediately if symptoms worsen or new concerns arise, she voices understanding.       Evalina Tabak G. [place], MD  [facility]. [facility] office.

## 2016-02-15 NOTE — Patient Instructions (Signed)
  A few things to remember from today's visit:   Asthma with exacerbation, moderate persistent - Plan: ipratropium-albuterol (DUONEB) 0.5-2.5 (3) MG/3ML nebulizer solution 3 mL  Laryngitis, acute  Voice rest. No changes on Symbicort or albuterol Treatments. Stop Benadryl and try hydroxyzine. Continue prednisone. Resume Allegra and Pepcid  Stop ibuprofen. Please seek immediate medical attention if worsening symptoms, sore throat, fever, or other worrisome sign.  Please be sure medication list is accurate. If a new problem present, please set up appointment sooner than planned today.

## 2016-02-15 NOTE — Telephone Encounter (Signed)
Pt returning call for an appointment/sick [phone].Megan Wilkerson

## 2016-02-15 NOTE — Telephone Encounter (Signed)
Patient Name: Megan MomentRNESTINE Robarge DOB: 1965-06-18 Initial Comment Caller says, asthma, chest tight and wheezing , wants an appt Nurse Assessment Nurse: Yetta BarreJones, RN, Miranda Date/Time (Eastern Time): 02/15/2016 11:07:17 AM Confirm and document reason for call. If symptomatic, describe symptoms. You must click the next button to save text entered. ---Caller states she was seen by PCP last week because of an allergic reaction to food. She was started on Prednison but has been getting worse. She has chest tightness, hoarseness, and wheezing. Albuterol Q6 hrs but has not used it today. Has the patient traveled out of the country within the last 30 days? ---No Does the patient have any new or worsening symptoms? ---Yes Will a triage be completed? ---Yes Related visit to physician within the last 2 weeks? ---Yes Does the PT have any chronic conditions? (i.e. diabetes, asthma, etc.) ---Yes List chronic conditions. ---Asthma, GERD Is the patient pregnant or possibly pregnant? (Ask all females between the ages of 6812-55) ---No Is this a behavioral health or substance abuse call? ---No Guidelines Guideline Title Affirmed Question Affirmed Notes Asthma Attack [1] MILD asthma attack (e.g., no SOB at rest, mild SOB with walking, speaks normally in sentences, mild wheezing) AND [2] persists > 24 hours on appropriate treatment Final Disposition User See Physician within 24 Hours Yetta BarreJones, RN, Miranda Comments Appt not available with PCP or at primary office. Appt schedule for 1:45pm at [facility] with Dr. [place] Called patient back and she is just now doing the nebulizer treatment. Told caller to continue that treatment and repeat at noon. If not improved, she needs to go to the ED. Note: she states she was unable to get an appt with Allergist after the reaction she was seen for last week. Referrals GO TO FACILITY UNDECIDED Disagree/Comply: Comply

## 2016-02-15 NOTE — Telephone Encounter (Signed)
Attempted to call pt. Line was busy x2. Will try back later. 

## 2016-02-25 ENCOUNTER — Ambulatory Visit (INDEPENDENT_AMBULATORY_CARE_PROVIDER_SITE_OTHER): Payer: Managed Care, Other (non HMO) | Admitting: Internal Medicine

## 2016-02-25 ENCOUNTER — Encounter: Payer: Self-pay | Admitting: Internal Medicine

## 2016-02-25 DIAGNOSIS — T7840XA Allergy, unspecified, initial encounter: Secondary | ICD-10-CM

## 2016-02-25 DIAGNOSIS — J45909 Unspecified asthma, uncomplicated: Secondary | ICD-10-CM | POA: Diagnosis not present

## 2016-02-25 DIAGNOSIS — J4541 Moderate persistent asthma with (acute) exacerbation: Secondary | ICD-10-CM

## 2016-02-25 DIAGNOSIS — J4521 Mild intermittent asthma with (acute) exacerbation: Secondary | ICD-10-CM

## 2016-02-25 MED ORDER — DOXYCYCLINE HYCLATE 100 MG PO TABS: 100.0000 mg | ORAL_TABLET | Freq: Two times a day (BID) | ORAL | 0 refills | Status: DC

## 2016-02-25 MED ORDER — ALBUTEROL SULFATE 108 (90 BASE) MCG/ACT IN AEPB: 1.0000 | INHALATION_SPRAY | Freq: Four times a day (QID) | RESPIRATORY_TRACT | 11 refills | Status: DC

## 2016-02-25 MED ORDER — PREDNISONE 20 MG PO TABS: 40.0000 mg | ORAL_TABLET | Freq: Every day | ORAL | 0 refills | Status: DC

## 2016-02-25 MED ORDER — IPRATROPIUM-ALBUTEROL 0.5-2.5 (3) MG/3ML IN SOLN
3.0000 mL | Freq: Once | RESPIRATORY_TRACT | Status: AC
  Administered 2016-02-25: 3 mL via RESPIRATORY_TRACT

## 2016-02-25 MED ORDER — BUDESONIDE-FORMOTEROL FUMARATE 80-4.5 MCG/ACT IN AERO: 2.0000 | INHALATION_SPRAY | Freq: Two times a day (BID) | RESPIRATORY_TRACT | 11 refills | Status: DC

## 2016-02-25 NOTE — Progress Notes (Signed)
Subjective:    Patient ID: Megan Wilkerson, female    DOB: February 02, 1966, 50 y.o.   MRN: [id number]  HPI The patient is a 50 YO female coming in for SOB and wheezing. She had allergic reaction about 2-3 weeks ago and took a course of prednisone. During that course she developed more respiratory symptoms and hoarseness as well as drainage and sinus pressure. She saw urgent care and they advised her to finish the prednisone. It has not helped much. She did not feel much better on it. Taking otc allergy medicine as well which is minimally helpful. Coughing up a slight amount but mostly non-productive.   Review of Systems  Constitutional: Positive for appetite change. Negative for activity change, chills, fatigue, fever and unexpected weight change.  HENT: Positive for congestion, postnasal drip, sore throat and voice change. Negative for dental problem, rhinorrhea, sinus pressure and trouble swallowing.   Eyes: Negative.   Respiratory: Positive for cough, shortness of breath and wheezing. Negative for chest tightness.   Cardiovascular: Negative for chest pain, palpitations and leg swelling.  Gastrointestinal: Negative for abdominal distention, abdominal pain, constipation, diarrhea and nausea.  Musculoskeletal: Negative.   Skin: Negative for rash.  Neurological: Negative.   Psychiatric/Behavioral: Negative.       Objective:   Physical Exam  Constitutional: She is oriented to person, place, and time. She appears well-developed and well-nourished.  Overweight  HENT:  Head: Normocephalic and atraumatic.  Oropharynx with mild erythema and clear drainage.   Eyes: EOM are normal.  Neck: Normal range of motion.  Cardiovascular: Normal rate and regular rhythm.   Pulmonary/Chest: Effort normal. No respiratory distress. She has wheezes. She has no rales.  Breathing is tight and wheezing bilaterally, after the breathing treatment she is open more with minimal wheezing.   Abdominal: Soft. Bowel  sounds are normal. She exhibits no distension. There is no tenderness. There is no rebound.  Musculoskeletal: She exhibits no edema.  Neurological: She is alert and oriented to person, place, and time. Coordination normal.  Skin: Skin is warm and dry.   Vitals:   02/25/16 1444  BP: 112/76  Pulse: (!) 102  Resp: 16  Temp: 98.3 F (36.8 C)  TempSrc: Oral  SpO2: 98%  Weight: 226 lb (102.5 kg)  Height: 5\' 6"  (1.676 m)      Assessment & Plan:  Duoneb Nebulizer given at visit.

## 2016-02-25 NOTE — Progress Notes (Signed)
Pre visit review using our clinic review tool, if applicable. No additional management support is needed unless otherwise documented below in the visit note. 

## 2016-02-25 NOTE — Patient Instructions (Signed)
We have sent in more prednisone so take 2 pills daily for the next week.   We have also sent in doxycycline to take 1 pill twice a day for 1 week to help with the breathing.   If you are not doing better call us or come back.   We have sent in the refills of the symbicort and the proair for you.

## 2016-02-26 NOTE — Assessment & Plan Note (Addendum)
Rx for prednisone and doxycycline for flare of the asthma. She is still taking the symbicort and albuterol as needed. Given duoneb in the office which was effective for her wheezing.

## 2016-03-04 ENCOUNTER — Telehealth: Payer: Self-pay | Admitting: Internal Medicine

## 2016-03-04 ENCOUNTER — Encounter: Payer: Self-pay | Admitting: Internal Medicine

## 2016-03-04 NOTE — Telephone Encounter (Signed)
Letter done and in outgoing mail this afternoon

## 2016-03-04 NOTE — Telephone Encounter (Signed)
Pt is requesting that Dr Maple HudsonYoung write a letter for her disability stating that she is severely allergic to Peanuts and Paprika.  Spoke with CY and he agreed to write the letter, pt aware that we will mail this once complete. Can be addressed as "To Whom It May Concern".  Will send to Dr Maple HudsonYoung.

## 2016-03-09 ENCOUNTER — Encounter: Payer: Self-pay | Admitting: Allergy and Immunology

## 2016-03-09 ENCOUNTER — Ambulatory Visit (INDEPENDENT_AMBULATORY_CARE_PROVIDER_SITE_OTHER): Payer: Managed Care, Other (non HMO) | Admitting: Allergy and Immunology

## 2016-03-09 VITALS — BP 120/94 | HR 92 | Temp 98.8°F | Resp 18 | Ht 62.99 in | Wt 230.8 lb

## 2016-03-09 DIAGNOSIS — K219 Gastro-esophageal reflux disease without esophagitis: Secondary | ICD-10-CM

## 2016-03-09 DIAGNOSIS — J383 Other diseases of vocal cords: Secondary | ICD-10-CM | POA: Diagnosis not present

## 2016-03-09 DIAGNOSIS — J4541 Moderate persistent asthma with (acute) exacerbation: Secondary | ICD-10-CM

## 2016-03-09 DIAGNOSIS — Z7952 Long term (current) use of systemic steroids: Secondary | ICD-10-CM | POA: Diagnosis not present

## 2016-03-09 DIAGNOSIS — Z91018 Allergy to other foods: Secondary | ICD-10-CM

## 2016-03-09 DIAGNOSIS — J3089 Other allergic rhinitis: Secondary | ICD-10-CM | POA: Diagnosis not present

## 2016-03-09 DIAGNOSIS — F458 Other somatoform disorders: Secondary | ICD-10-CM

## 2016-03-09 MED ORDER — DEXLANSOPRAZOLE 60 MG PO CPDR: DELAYED_RELEASE_CAPSULE | ORAL | 5 refills | Status: DC

## 2016-03-09 MED ORDER — BUDESONIDE-FORMOTEROL FUMARATE 160-4.5 MCG/ACT IN AERO: INHALATION_SPRAY | RESPIRATORY_TRACT | 5 refills | Status: DC

## 2016-03-09 MED ORDER — RANITIDINE HCL 300 MG PO TABS: ORAL_TABLET | ORAL | 5 refills | Status: DC

## 2016-03-09 MED ORDER — MONTELUKAST SODIUM 10 MG PO TABS: ORAL_TABLET | ORAL | 5 refills | Status: DC

## 2016-03-09 NOTE — Progress Notes (Signed)
Dear Dr. Maple Hudson,  Thank you for referring Yaris Kibe to the [facility] on 03/09/2016.   Below is a summation of this patient's evaluation and recommendations.  Thank you for your referral. I will keep you informed about this patient's response to treatment.   If you have any questions please to do hesitate to contact me.   Sincerely,  Jessica Priest, MD [facility]   ______________________________________________________________________    NEW PATIENT NOTE  Referring Provider: Waymon Budge, MD Primary Provider: Myrlene Broker, MD Date of office visit: 03/09/2016    Subjective:   Chief Complaint:  Nonya Pahl (DOB: 08/20/1965) is a 50 y.o. female who presents to the clinic on 03/09/2016 with a chief complaint of Cough .     HPI: Tuwanda presents this clinic in evaluation of "allergic reactions" that have been a long-standing issue of many years duration evaluated by multiple physicians including Dr. Fannie Knee.  Apparently Lilium develops these "allergic reactions" manifested as problems with her throat. No matter what the trigger is giving rise to these allergic reactions she always develops throat clearing and itchy throat and a lump stuck in her throat and some coughing and then laryngitis. Her most recent episode occurred in 1 month ago. She was eating some colbycheese and developed dizziness and tingling in her hands and the sensation that her lips were swelling and then acute laryngitis and some coughing and wheezing. She has been treated with 3 systemic steroids during this timeframe in an attempt to eliminate this allergic reaction. She has no associated skin problems or GI issues or other atopic symptoms with this reaction. She believes that consumption of peanut and paprika and shellfish and fish and tomatoes and sunflower and wheat bran contributes to these  allergic reactions. She has had such significant throat involvement and dizziness that she has passed out in the past with these reactions. Apparently she has seen Dr. Annalee Genta for some of her throat issues and he told her that she may have reflux.  She apparently carries the diagnosis of asthma and she uses her nebulizer extensively although this does not really help her to any degree with her sensation of coughing and feeling winded. The symptoms occur while using Symbicort.  She states that she has had the administration of systemic steroids at least 6 times in the past year and this pattern has been going on for the past 5 years for this allergic reaction issue or asthma.  She does have an issue with nasal congestion and sneezing and occasional pressure in her head and occasionally some intermittent yellow nasal discharge for which she will get an antibiotic about 3 times a year.  She complains about having frontal and facial and behind the eye headaches with a pounding quality sometimes associated with dots and sometimes vomiting and sometimes the need to lay down about 3 times a month.  Sharina drinks 5 cups of tea per day.  She has reflux that she treats with Pepcid. This is still a very active issue with regurgitation commonly.    Past Medical History:  Diagnosis Date  . Anaphylactic reaction    several ED visits for same since 01/2012  . Asthma   . GERD   . Hives of unknown origin    chronic and recurrent urticaria since 01/2012  . HYPERLIPIDEMIA, WITH LOW HDL   . MIGRAINE HEADACHE   . PERIPHERAL  NEUROPATHY   . POSITIVE PPD 1987   s/p 19mo tx    Past Surgical History:  Procedure Laterality Date  . ABDOMINAL HYSTERECTOMY  1996  . APPENDECTOMY  1992  . CESAREAN SECTION  1989  . CHOLECYSTECTOMY  04/22/2011   Procedure: LAPAROSCOPIC CHOLECYSTECTOMY WITH INTRAOPERATIVE CHOLANGIOGRAM;  Surgeon: Almond Lint, MD;  Location: [facility] OR;  Service: General;  Laterality: N/A;  . CYST  REMOVAL HAND Right 1997      Medication List      albuterol (2.5 MG/3ML) 0.083% nebulizer solution Commonly known as:  PROVENTIL Take 3 mLs (2.5 mg total) by nebulization every 6 (six) hours as needed for wheezing or shortness of breath.   B-6 PO Take 1 tablet by mouth daily at 12 noon.   brimonidine 0.15 % ophthalmic solution Commonly known as:  ALPHAGAN Place 1 drop into both eyes 2 (two) times daily.   budesonide-formoterol 80-4.5 MCG/ACT inhaler Commonly known as:  SYMBICORT Inhale 2 puffs into the lungs 2 (two) times daily.   Compressor/Nebulizer Misc As directed   doxycycline 100 MG tablet Commonly known as:  VIBRA-TABS Take 1 tablet (100 mg total) by mouth 2 (two) times daily.   EPINEPHrine 0.3 mg/0.3 mL Soaj injection Commonly known as:  EPI-PEN Inject 0.3 mLs (0.3 mg total) into the muscle once.   famotidine 20 MG tablet Commonly known as:  PEPCID Take 1 tablet (20 mg total) by mouth 2 (two) times daily.   fexofenadine 180 MG tablet Commonly known as:  ALLEGRA Take 180 mg by mouth daily.   fluticasone 50 MCG/ACT nasal spray Commonly known as:  FLONASE Place 2 sprays into both nostrils daily.   hydrOXYzine 25 MG tablet Commonly known as:  ATARAX/VISTARIL Take 1 tablet (25 mg total) by mouth 3 (three) times daily as needed for itching.   predniSONE 20 MG tablet Commonly known as:  DELTASONE Take 2 tablets (40 mg total) by mouth daily.       Allergies  Allergen Reactions  . Other Anaphylaxis    Archie Balboa rancher gummies and paprika   . Peanuts [Peanut Oil] Anaphylaxis and Itching  . Corn Dextrin Other (See Comments)    Plain corn ok, popcorn causes hives  . Gluten Meal Other (See Comments)    cough  . Shellfish Allergy Itching  . Soy Allergy Other (See Comments)    coughing  . Sunflower Oil Itching  . Tomato Itching  . Wheat Bran     swelling  . Morphine Itching and Rash    Review of systems negative except as noted in HPI / PMHx or noted  below:  Review of Systems  Constitutional: Negative.   HENT: Negative.   Eyes: Negative.   Respiratory: Negative.   Cardiovascular: Negative.   Gastrointestinal: Negative.   Genitourinary: Negative.   Musculoskeletal: Negative.   Skin: Negative.   Neurological: Negative.   Endo/Heme/Allergies: Negative.   Psychiatric/Behavioral: Negative.     Family History  Problem Relation Age of Onset  . Prostate cancer Father   . Hypertension Father   . Heart disease Father   . Diabetes Mother   . Diabetes Brother   . Alcohol abuse Other   . Arthritis Other   . Hypertension Other     Social History   Social History  . Marital status: Married    Spouse name: N/A  . Number of children: N/A  . Years of education: N/A   Occupational History  . Not on file.   Social History Main  Topics  . Smoking status: Never Smoker  . Smokeless tobacco: Never Used  . Alcohol use No  . Drug use: No  . Sexual activity: Not on file   Other Topics Concern  . 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  Objective:   Vitals:   03/09/16 1021  BP: (!) 120/94  Pulse: 92  Resp: 18  Temp: 98.8 F (37.1 C)   Height: 5' 2.99" (160 cm) Weight: 230 lb 12.8 oz (104.7 kg)  Physical Exam  Constitutional: She is well-developed, well-nourished, and in no distress.  Extremely squeaky, high frequency, low-volume voice with lots of throat clearing.  HENT:  Head: Normocephalic. Head is without right periorbital erythema and without left periorbital erythema.  Right Ear: Tympanic membrane, external ear and ear canal normal.  Left Ear:  Tympanic membrane, external ear and ear canal normal.  Nose: Nose normal. No mucosal edema or rhinorrhea.  Mouth/Throat: Oropharynx is clear and moist and mucous membranes are normal. No oropharyngeal exudate.  Eyes: Conjunctivae and lids are normal. Pupils are equal, round, and reactive to light.  Neck: Trachea normal. No tracheal deviation present. No thyromegaly present.  Cardiovascular: Normal rate, regular rhythm, S1 normal, S2 normal and normal heart sounds.   No murmur heard. Pulmonary/Chest: Effort normal. No stridor. No tachypnea. No respiratory distress. She has no wheezes. She has no rales. She exhibits no tenderness.  Abdominal: Soft. She exhibits no distension and no mass. There is no hepatosplenomegaly. There is no tenderness. There is no rebound and no guarding.  Musculoskeletal: She exhibits no edema or tenderness.  Lymphadenopathy:       Head (right side): No tonsillar adenopathy present.       Head (left side): No tonsillar adenopathy present.    She has no cervical adenopathy.    She has no axillary adenopathy.  Neurological: She is alert. Gait normal.  Skin: No rash noted. She is not diaphoretic. No erythema. No pallor. Nails show no clubbing.  Psychiatric: Mood and affect normal.    Diagnostics: Allergy skin tests were performed. She demonstrated hypersensitivity to grasses and weeds and trees and house dust mite and cockroach. She did not demonstrate any hypersensitivity to food.  Spirometry was performed and demonstrated an FEV1 of 2.53 @ 104 % of predicted. Following the administration of nebulized albuterol her FEV1 rose to 2.67 which was an increase of 6%.  Results of blood tests obtained on 07/23/2014 identified a white blood cell count of 6.4 with a normal differential and a total eosinophil count of 200, hemoglobin 14.7, platelet 213.  Results of a head CT scan obtained on 20 to October 2013 identified no significant sinus disease.  Results of lung volumes  obtained on 10/02/2012 identified a TLC of 83%, RV of 83%, DLCO at 90%, DLCO/VA at 86%.  Assessment and Plan:    1. Asthma, not well controlled, moderate persistent, with acute exacerbation   2. Other allergic rhinitis   3. LPRD (laryngopharyngeal reflux disease)   4. Food allergy   5. Vocal cord dysfunction   6. Hyperventilation syndrome   7. Long  term (current) use of systemic steroids     1. Allergen avoidance measures  2. Treat and prevent inflammation:   A. Symbicort 160 - 2 inhalations twice a day with spacer  B. Flonase 1 spray each nostril twice a day  C. montelukast 10 mg one tablet once a day  3. Treat and prevent reflux:   A. slowly taper off all caffeine  B. Dexilant 60 mg one tablet in a.m.  C. ranitidine 300 mg one tablet in PM  4. Paper bag rebreathing technique for hyperventilation  5. If needed:   A. EpiPen, Benadryl, M.D./ER for allergic reaction  B. Proventil HFA 2 puffs or nebulization every 4-6 hours   C. OTC antihistamine - Claritin/Zyrtec/Allegra  6. Blood - peanut components, nut panel, shellfish panel, fish panel, tomato IgE, sunflower IgE  7. Obtain a bone density scan for osteoporosis (03/14/16 @ 3:30 - [facility])  8. No more injected or oral steroids  9. Return to clinic in 2 weeks or earlier if problem  Although Earnestine certainly has atopic disease contributing to her respiratory tract symptoms and appears to have reflux-induced respiratory disease also contributing to her respiratory tract symptoms she has a classic history and presentation for vocal cord dysfunction and hyperventilation syndrome. I had a talk with her today about this issue and how to treat this issue which will hopefully prevent her from developing significant peripheral paresthesias and vertigo and syncope. Whether or not she has a true food allergy is unknown at this point in time. I could not really see much evidence for a food allergy on today skin testing but  we will follow this up with blood tests as noted above. I suspect that most of her "allergic reactions" are probably a manifestation of hyperventilation syndrome and vocal cord dysfunction. I will regroup with her in 2 or 3 weeks to assess her response to therapy. I also question whether or not she truly has significant asthma as I think that some of her throat issues are being misinterpreted as asthma especially given her lung volume analysis performed in 2014. I think it is necessary for her to get a bone density scan given the fact that she is received multiple courses of systemic steroids per year over the course of the past 5 years and her mom has a history of osteoporosis.  Jessica Priest, MD [facility]

## 2016-03-09 NOTE — Patient Instructions (Addendum)
  1. Allergen avoidance measures  2. Treat and prevent inflammation:   A. Symbicort 160 - 2 inhalations twice a day with spacer  B. Flonase 1 spray each nostril twice a day  C. montelukast 10 mg one tablet once a day  3. Treat and prevent reflux:   A. slowly taper off all caffeine  B. Dexilant 60 mg one tablet in a.m.  C. ranitidine 300 mg one tablet in PM  4. Paper bag rebreathing technique for hyperventilation  5. If needed:   A. EpiPen, Benadryl, M.D./ER for allergic reaction  B. Proventil HFA 2 puffs or nebulization every 4-6 hours   C. OTC antihistamine - Claritin/Zyrtec/Allegra  6. Blood - peanut components, nut panel, shellfish panel, fish panel, tomato IgE, sunflower IgE  7. Obtain a bone density scan for osteoporosis (03/14/16 @ 3:30 - [facility])  8. No more injected or oral steroids  9. Return to clinic in 2 weeks or earlier if problem

## 2016-03-13 LAB — ALLERGEN PROFILE, SHELLFISH
Clam IgE: 0.73 kU/L — AB
F023-IGE CRAB: 2.24 kU/L — AB
F080-IGE LOBSTER: 1.39 kU/L — AB
F290-IGE OYSTER: 0.77 kU/L — AB
Scallop IgE: 1.03 kU/L — AB
Shrimp IgE: 3.69 kU/L — AB

## 2016-03-13 LAB — IGE PEANUT COMPONENT PROFILE: F427-IgE Ara h 9: 0.14 kU/L — AB

## 2016-03-13 LAB — TOMATO IGE: Allergen Tomato, IgE: 0.35 kU/L — AB

## 2016-03-13 LAB — ALLERGENS(7)
F020-IgE Almond: <0.1 kU/L
F202-IgE Cashew Nut: <0.1 kU/L
Hazelnut (Filbert) IgE: <0.1 kU/L
Peanut IgE: <0.1 kU/L
Pecan Nut IgE: <0.1 kU/L
WALNUT IGE: 0.16 kU/L — AB

## 2016-03-13 LAB — ALLERGEN SUNFLOWER SEED K84: ALLERGEN SUNFLOWER SEED K84: 0.12 kU/L — AB

## 2016-03-13 LAB — ALLERGEN PROFILE, FOOD-FISH
Allergen Mackerel IgE: <0.1 kU/L
Allergen Salmon IgE: <0.1 kU/L
Allergen Trout IgE: <0.1 kU/L
Tuna: <0.1 kU/L

## 2016-03-14 ENCOUNTER — Ambulatory Visit
Admission: RE | Admit: 2016-03-14 | Discharge: 2016-03-14 | Disposition: A | Discharge status: Home / Self Care | Payer: Managed Care, Other (non HMO) | Source: Ambulatory Visit | Attending: Allergy and Immunology | Admitting: Allergy and Immunology

## 2016-03-30 ENCOUNTER — Encounter: Payer: Self-pay | Admitting: Allergy and Immunology

## 2016-03-30 ENCOUNTER — Ambulatory Visit (INDEPENDENT_AMBULATORY_CARE_PROVIDER_SITE_OTHER): Payer: Managed Care, Other (non HMO) | Admitting: Allergy and Immunology

## 2016-03-30 VITALS — BP 116/76 | HR 78 | Resp 16

## 2016-03-30 DIAGNOSIS — J4541 Moderate persistent asthma with (acute) exacerbation: Secondary | ICD-10-CM | POA: Diagnosis not present

## 2016-03-30 DIAGNOSIS — Z91018 Allergy to other foods: Secondary | ICD-10-CM | POA: Diagnosis not present

## 2016-03-30 DIAGNOSIS — J3089 Other allergic rhinitis: Secondary | ICD-10-CM

## 2016-03-30 DIAGNOSIS — F458 Other somatoform disorders: Secondary | ICD-10-CM

## 2016-03-30 DIAGNOSIS — K219 Gastro-esophageal reflux disease without esophagitis: Secondary | ICD-10-CM

## 2016-03-30 DIAGNOSIS — J383 Other diseases of vocal cords: Secondary | ICD-10-CM

## 2016-03-30 NOTE — Progress Notes (Signed)
Follow-up Note  Referring Provider: Myrlene Broker, * Primary Provider: Myrlene Broker, MD Date of Office Visit: 03/30/2016  Subjective:   Megan Wilkerson (DOB: Apr 10, 1966) is a 50 y.o. female who [facility]2016 in re-evaluation of the following:  HPI: Megan Wilkerson returns to this clinic in reevaluation of her allergic rhinitis, asthma, vocal cord dysfunction with hyperventilation, food allergy, LPR, and history of extensive systemic steroid use.  Utilizing medical therapy established during her initial visit of 03/09/2016 she is better regarding some issues. She thinks that her throat is somewhat better although she still continues to have raspy voice. She believes that her nose is doing quite well at this point in time. Her headaches have decreased in intensity and frequency. Her reflux is doing very well.  However, she still must use a bronchodilator 4 times a day for problems with shortness of breath and wheezing and squeaky voice. She has not developed any hyperventilation episodes and appears to understand the issue about recognizing this problem.    Medication List      albuterol (2.5 MG/3ML) 0.083% nebulizer solution Commonly known as:  PROVENTIL Take 3 mLs (2.5 mg total) by nebulization every 6 (six) hours as needed for wheezing or shortness of breath.   B-6 PO Take 1 tablet by mouth daily at 12 noon.   brimonidine 0.15 % ophthalmic solution Commonly known as:  ALPHAGAN Place 1 drop into both eyes 2 (two) times daily.   budesonide-formoterol 160-4.5 MCG/ACT inhaler Commonly known as:  SYMBICORT Inhale two puffs twice daily to prevent cough or wheeze. Use with spacer.  Rinse, gargle, and spit after use.   Compressor/Nebulizer Misc As directed   dexlansoprazole 60 MG capsule Commonly known as:  DEXILANT Take one capsule every morning before breakfast   doxycycline 100 MG tablet Commonly known as:   VIBRA-TABS Take 1 tablet (100 mg total) by mouth 2 (two) times daily.   EPINEPHrine 0.3 mg/0.3 mL Soaj injection Commonly known as:  EPI-PEN Inject 0.3 mLs (0.3 mg total) into the muscle once.   famotidine 20 MG tablet Commonly known as:  PEPCID Take 1 tablet (20 mg total) by mouth 2 (two) times daily.   fexofenadine 180 MG tablet Commonly known as:  ALLEGRA Take 180 mg by mouth daily.   fluticasone 50 MCG/ACT nasal spray Commonly known as:  FLONASE Place 2 sprays into both nostrils daily.   hydrOXYzine 25 MG tablet Commonly known as:  ATARAX/VISTARIL Take 1 tablet (25 mg total) by mouth 3 (three) times daily as needed for itching.   montelukast 10 MG tablet Commonly known as:  SINGULAIR Take one tablet once daily   ranitidine 300 MG tablet Commonly known as:  ZANTAC Take one tablet each evening       Past Medical History:  Diagnosis Date  . Anaphylactic reaction    several ED visits for same since 01/2012  . Asthma   . GERD   . Hives of unknown origin    chronic and recurrent urticaria since 01/2012  . HYPERLIPIDEMIA, WITH LOW HDL   . MIGRAINE HEADACHE   . PERIPHERAL NEUROPATHY   . POSITIVE PPD 1987   s/p 53mo tx    Past Surgical History:  Procedure Laterality Date  . ABDOMINAL HYSTERECTOMY  1996  . APPENDECTOMY  1992  . CESAREAN SECTION  1989  . CHOLECYSTECTOMY  04/22/2011   Procedure: LAPAROSCOPIC CHOLECYSTECTOMY WITH INTRAOPERATIVE CHOLANGIOGRAM;  Surgeon: Almond Lint, MD;  Location:  [facility] OR;  Service: General;  Laterality: N/A;  . CYST REMOVAL HAND Right 1997    Allergies  Allergen Reactions  . Other Anaphylaxis    Archie Balboa rancher gummies and paprika   . Peanuts [Peanut Oil] Anaphylaxis and Itching  . Corn Dextrin Other (See Comments)    Plain corn ok, popcorn causes hives  . Gluten Meal Other (See Comments)    cough  . Shellfish Allergy Itching  . Soy Allergy Other (See Comments)    coughing  . Sunflower Oil Itching  . Tomato Itching  . Wheat  Bran     swelling  . Morphine Itching and Rash    Review of systems negative except as noted in HPI / PMHx or noted below:  Review of Systems  Constitutional: Negative.   HENT: Negative.   Eyes: Negative.   Respiratory: Negative.   Cardiovascular: Negative.   Gastrointestinal: Negative.   Genitourinary: Negative.   Musculoskeletal: Negative.   Skin: Negative.   Neurological: Negative.   Endo/Heme/Allergies: Negative.   Psychiatric/Behavioral: Negative.      Objective:   Vitals:   03/30/16 1106  BP: 116/76  Pulse: 78  Resp: 16          Physical Exam  Constitutional: She is well-developed, well-nourished, and in no distress.  Very unstable squeaky voice  HENT:  Head: Normocephalic.  Right Ear: Tympanic membrane, external ear and ear canal normal.  Left Ear: Tympanic membrane, external ear and ear canal normal.  Nose: Nose normal. No mucosal edema or rhinorrhea.  Mouth/Throat: Uvula is midline, oropharynx is clear and moist and mucous membranes are normal. No oropharyngeal exudate.  Eyes: Conjunctivae are normal.  Neck: Trachea normal. No tracheal tenderness present. No tracheal deviation present. No thyromegaly present.  Cardiovascular: Normal rate, regular rhythm, S1 normal, S2 normal and normal heart sounds.   No murmur heard. Pulmonary/Chest: Breath sounds normal. No stridor. No respiratory distress. She has no wheezes. She has no rales.  Musculoskeletal: She exhibits no edema.  Lymphadenopathy:       Head (right side): No tonsillar adenopathy present.       Head (left side): No tonsillar adenopathy present.    She has no cervical adenopathy.  Neurological: She is alert. Gait normal.  Skin: No rash noted. She is not diaphoretic. No erythema. Nails show no clubbing.  Psychiatric: Mood and affect normal.    Diagnostics: Results of a bone densitometry scan obtained on 03/14/2006 did not identify any significant osteoporosis or osteopenia.  Results of blood  tests obtained on 03/09/2016 identified increased IgE antibodies directed against various members of shellfish family, a very low titer of IgE antibodies directed against walnut with a level of 0.16 KU/L, and all IgE antibodies directed against peanut components being contained within the ARA H9 family at a titer of 0.14 KU/L   Spirometry was performed and demonstrated an FEV1 of 2.43 at 93 % of predicted.  Assessment and Plan:   1. Asthma, not well controlled, moderate persistent, with acute exacerbation   2. Other allergic rhinitis   3. Food allergy   4. Vocal cord dysfunction   5. Hyperventilation syndrome   6. LPRD (laryngopharyngeal reflux disease)     1. Allergen avoidance measures  2. Continue to Treat and prevent inflammation:   A. Symbicort 160 - 2 inhalations twice a day with spacer  B. Flonase 1 spray each nostril twice a day  C. montelukast 10 mg one tablet once a day  3. Continue to Treat  and prevent reflux:   A. Continue off all caffeine  B. Dexilant 60 mg one tablet in a.m.  C. ranitidine 300 mg one tablet in PM  4. Use Paper bag rebreathing technique for hyperventilation  5. If needed:   A. EpiPen, Benadryl, M.D./ER for allergic reaction  B. Proventil HFA 2 puffs or nebulization every 4-6 hours   C. OTC antihistamine - Claritin/Zyrtec/Allegra  6. Consider a course of immunotherapy   7. May need to also consider Xolair or nucala administration if not doing well  8. No more injected or oral steroids  9. Peanut challenge?  10. Return to clinic in late December or earlier if problem  Although Kerryanne is better she certainly has respiratory tract symptoms which are probably tied up with her persistent vocal cord dysfunction as well as some of her atopic disease. The only other recommendation I had for her regarding further management of her atopic diseases is to perform allergen avoidance measures against the allergen she demonstrated hypersensitivity to  with previous skin testing and to possibly consider a course of immunotherapy. I would like to hold off on any biological agent administration at this point until we can get a better idea of how she is going to do as we move forward over the course of the next several months. She will continue to use anti-inflammatory medications for her respiratory tract and very aggressive therapy directed against reflux as noted above. She would be a candidate for an in clinic food challenge but she is not going to have exposure to peanut at this point in time. Obviously she needs to remain away from the other food products that she is allergic to at this point.  Laurette Schimke, MD [facility]

## 2016-03-30 NOTE — Patient Instructions (Addendum)
  1. Allergen avoidance measures  2. Continue to Treat and prevent inflammation:   A. Symbicort 160 - 2 inhalations twice a day with spacer  B. Flonase 1 spray each nostril twice a day  C. montelukast 10 mg one tablet once a day  3. Continue to Treat and prevent reflux:   A. slowly taper off all caffeine  B. Dexilant 60 mg one tablet in a.m.  C. ranitidine 300 mg one tablet in PM  4. Use Paper bag rebreathing technique for hyperventilation  5. If needed:   A. EpiPen, Benadryl, M.D./ER for allergic reaction  B. Proventil HFA 2 puffs or nebulization every 4-6 hours   C. OTC antihistamine - Claritin/Zyrtec/Allegra  6. Consider a course of immunotherapy   7. May need to also consider Xolair or nucala administration if not doing well  8. No more injected or oral steroids  9. Peanut challenge?  10. Return to clinic in late December or earlier if problem

## 2016-05-17 ENCOUNTER — Ambulatory Visit (INDEPENDENT_AMBULATORY_CARE_PROVIDER_SITE_OTHER): Payer: Managed Care, Other (non HMO) | Admitting: Allergy and Immunology

## 2016-05-17 VITALS — BP 140/80 | HR 70 | Resp 14

## 2016-05-17 DIAGNOSIS — J3089 Other allergic rhinitis: Secondary | ICD-10-CM

## 2016-05-17 DIAGNOSIS — J383 Other diseases of vocal cords: Secondary | ICD-10-CM

## 2016-05-17 DIAGNOSIS — F458 Other somatoform disorders: Secondary | ICD-10-CM | POA: Diagnosis not present

## 2016-05-17 DIAGNOSIS — J4540 Moderate persistent asthma, uncomplicated: Secondary | ICD-10-CM | POA: Diagnosis not present

## 2016-05-17 DIAGNOSIS — K219 Gastro-esophageal reflux disease without esophagitis: Secondary | ICD-10-CM | POA: Diagnosis not present

## 2016-05-17 DIAGNOSIS — Z91018 Allergy to other foods: Secondary | ICD-10-CM

## 2016-05-17 NOTE — Patient Instructions (Signed)
  1. Continue Allergen avoidance measures  2. Continue to Treat and prevent inflammation:   A. Symbicort 160 - 2 inhalations twice a day with spacer  B. Flonase 1 spray each nostril twice a day  C. montelukast 10 mg one tablet once a day  3. Continue to Treat and prevent reflux:   A. slowly taper off all caffeine  B. Dexilant 60 mg one tablet in a.m.  C. ranitidine 300 mg one tablet in PM  4. Use Paper bag rebreathing technique for hyperventilation  5. If needed:   A. EpiPen, Benadryl, M.D./ER for allergic reaction  B. Proventil HFA 2 puffs or nebulization every 4-6 hours   C. OTC antihistamine - Claritin/Zyrtec/Allegra  6. Return to clinic in 3 months or earlier if problem

## 2016-05-17 NOTE — Progress Notes (Signed)
Follow-up Note  Referring Provider: Myrlene Broker, * Primary Provider: Myrlene Broker, MD Date of Office Visit: 05/17/2016  Subjective:   Megan Wilkerson (DOB: March 02, 1966) is a 50 y.o. female who [facility] in re-evaluation of the following:  HPI: Megan Wilkerson returns to this clinic in evaluation of her allergic rhinitis, asthma, vocal cord dysfunction with hyperventilation, food allergy, LPR, and history of extensive steroid use. I last saw her in his clinic in November 2017.  She has done much better on her current medical plan. She has no issues with her nose and no issues with her chest and does not need to use a short acting bronchodilator. She has no issues with headaches and she has no issues with reflux. This is the best that she has been in years.  She does inform me that whenever she eats cheese she sometimes gets a itchy throat. This sometimes happens when drinking milk as well.  She refuses to receive the flu vaccine.  Allergies as of 05/17/2016      Reactions   Other Anaphylaxis   Archie Balboa rancher gummies and paprika   Peanuts [peanut Oil] Anaphylaxis, Itching   Corn Dextrin Other (See Comments)   Plain corn ok, popcorn causes hives   Gluten Meal Other (See Comments)   cough   Shellfish Allergy Itching   Soy Allergy Other (See Comments)   coughing   Sunflower Oil Itching   Tomato Itching   Wheat Bran    swelling   Morphine Itching, Rash      Medication List      albuterol (2.5 MG/3ML) 0.083% nebulizer solution Commonly known as:  PROVENTIL Take 3 mLs (2.5 mg total) by nebulization every 6 (six) hours as needed for wheezing or shortness of breath.   B-6 PO Take 1 tablet by mouth daily at 12 noon.   brimonidine 0.15 % ophthalmic solution Commonly known as:  ALPHAGAN Place 1 drop into both eyes 2 (two) times daily.   budesonide-formoterol 160-4.5 MCG/ACT inhaler Commonly known as:   SYMBICORT Inhale two puffs twice daily to prevent cough or wheeze. Use with spacer.  Rinse, gargle, and spit after use.   Compressor/Nebulizer Misc As directed   dexlansoprazole 60 MG capsule Commonly known as:  DEXILANT Take one capsule every morning before breakfast   EPINEPHrine 0.3 mg/0.3 mL Soaj injection Commonly known as:  EPI-PEN Inject 0.3 mLs (0.3 mg total) into the muscle once.   famotidine 20 MG tablet Commonly known as:  PEPCID Take 1 tablet (20 mg total) by mouth 2 (two) times daily.   fexofenadine 180 MG tablet Commonly known as:  ALLEGRA Take 180 mg by mouth daily.   fluticasone 50 MCG/ACT nasal spray Commonly known as:  FLONASE Place 2 sprays into both nostrils daily.   hydrOXYzine 25 MG tablet Commonly known as:  ATARAX/VISTARIL Take 1 tablet (25 mg total) by mouth 3 (three) times daily as needed for itching.   montelukast 10 MG tablet Commonly known as:  SINGULAIR Take one tablet once daily   ranitidine 300 MG tablet Commonly known as:  ZANTAC Take one tablet each evening       Past Medical History:  Diagnosis Date  . Anaphylactic reaction    several ED visits for same since 01/2012  . Asthma   . GERD   . Hives of unknown origin    chronic and recurrent urticaria since 01/2012  . HYPERLIPIDEMIA, WITH LOW HDL   .  MIGRAINE HEADACHE   . PERIPHERAL NEUROPATHY   . POSITIVE PPD 1987   s/p 63mo tx    Past Surgical History:  Procedure Laterality Date  . ABDOMINAL HYSTERECTOMY  1996  . APPENDECTOMY  1992  . CESAREAN SECTION  1989  . CHOLECYSTECTOMY  04/22/2011   Procedure: LAPAROSCOPIC CHOLECYSTECTOMY WITH INTRAOPERATIVE CHOLANGIOGRAM;  Surgeon: Almond Lint, MD;  Location: [facility] OR;  Service: General;  Laterality: N/A;  . CYST REMOVAL HAND Right 1997    Review of systems negative except as noted in HPI / PMHx or noted below:  Review of Systems  Constitutional: Negative.   HENT: Negative.   Eyes: Negative.   Respiratory: Negative.    Cardiovascular: Negative.   Gastrointestinal: Negative.   Genitourinary: Negative.   Musculoskeletal: Negative.   Skin: Negative.   Neurological: Negative.   Endo/Heme/Allergies: Negative.   Psychiatric/Behavioral: Negative.      Objective:   Vitals:   05/17/16 1743  BP: 140/80  Pulse: 70  Resp: 14          Physical Exam  Constitutional: She is well-developed, well-nourished, and in no distress.  HENT:  Head: Normocephalic.  Right Ear: Tympanic membrane, external ear and ear canal normal.  Left Ear: Tympanic membrane, external ear and ear canal normal.  Nose: Nose normal. No mucosal edema or rhinorrhea.  Mouth/Throat: Uvula is midline, oropharynx is clear and moist and mucous membranes are normal. No oropharyngeal exudate.  Eyes: Conjunctivae are normal.  Neck: Trachea normal. No tracheal tenderness present. No tracheal deviation present. No thyromegaly present.  Cardiovascular: Normal rate, regular rhythm, S1 normal, S2 normal and normal heart sounds.   No murmur heard. Pulmonary/Chest: Breath sounds normal. No stridor. No respiratory distress. She has no wheezes. She has no rales.  Musculoskeletal: She exhibits no edema.  Lymphadenopathy:       Head (right side): No tonsillar adenopathy present.       Head (left side): No tonsillar adenopathy present.    She has no cervical adenopathy.  Neurological: She is alert. Gait normal.  Skin: No rash noted. She is not diaphoretic. No erythema. Nails show no clubbing.  Psychiatric: Mood and affect normal.    Diagnostics:    Spirometry was performed and demonstrated an FEV1 of 2.47 at 114 % of predicted.  Assessment and Plan:   1. Asthma, moderate persistent, well-controlled   2. Other allergic rhinitis   3. Vocal cord dysfunction   4. Hyperventilation syndrome   5. LPRD (laryngopharyngeal reflux disease)   6. Food allergy     1. Continue Allergen avoidance measures  2. Continue to Treat and prevent  inflammation:   A. Symbicort 160 - 2 inhalations twice a day with spacer  B. Flonase 1 spray each nostril twice a day  C. montelukast 10 mg one tablet once a day  3. Continue to Treat and prevent reflux:   A. slowly taper off all caffeine  B. Dexilant 60 mg one tablet in a.m.  C. ranitidine 300 mg one tablet in PM  4. Use Paper bag rebreathing technique for hyperventilation  5. If needed:   A. EpiPen, Benadryl, M.D./ER for allergic reaction  B. Proventil HFA 2 puffs or nebulization every 4-6 hours   C. OTC antihistamine - Claritin/Zyrtec/Allegra  6. Return to clinic in 3 months or earlier if problem  Overall Zari has excellent control of all of her respiratory tract issues including her asthma and allergic rhinitis and her reflux-induced respiratory disease as well as a very good  understanding and very good control of her vocal cord dysfunction and hyperventilation. I am going to continue to have her use anti-inflammatory agents for her respiratory tract and aggressive therapy directed against reflux at this point and regroup with her in approximately 3 months or earlier if there is a problem. I also had a conversation with her today about the fact that she should probably not be eating or drinking material that makes her throat itch including dairy.  Laurette Schimke, MD [facility]

## 2016-05-18 ENCOUNTER — Encounter: Payer: Self-pay | Admitting: Allergy and Immunology

## 2016-05-30 DIAGNOSIS — G4730 Sleep apnea, unspecified: Secondary | ICD-10-CM

## 2016-05-30 HISTORY — DX: Sleep apnea, unspecified: G47.30

## 2016-06-14 ENCOUNTER — Ambulatory Visit: Payer: Managed Care, Other (non HMO) | Admitting: Internal Medicine

## 2016-06-27 ENCOUNTER — Ambulatory Visit (INDEPENDENT_AMBULATORY_CARE_PROVIDER_SITE_OTHER): Payer: Managed Care, Other (non HMO) | Admitting: Internal Medicine

## 2016-06-27 ENCOUNTER — Encounter: Payer: Self-pay | Admitting: Internal Medicine

## 2016-06-27 VITALS — BP 128/82 | HR 85 | Ht 66.0 in | Wt 235.4 lb

## 2016-06-27 DIAGNOSIS — G4733 Obstructive sleep apnea (adult) (pediatric): Secondary | ICD-10-CM | POA: Diagnosis not present

## 2016-06-27 DIAGNOSIS — J45909 Unspecified asthma, uncomplicated: Secondary | ICD-10-CM

## 2016-06-27 NOTE — Patient Instructions (Signed)
Order- new DME, new CPAP auto 5-20, mask of choice, humidifier, supplies, AirView    Dx OSA  Dr Lucie LeatherKozlow will manage your asthma and allergy problems with you  Please call as needed

## 2016-06-27 NOTE — Progress Notes (Signed)
HPI female never smoker followed for OSA complicated by allergic rhinitis, food allergy (peanut/nut mix), , GERD, asthma/vocal cord dysfunction/hypoventilation CBC- 01/31/12- Eos 5.2 Allergy profiles 01/31/2012-total IgE 912 with specific elevations for many common inhalant allergens and foods. Office spirometry-05/09/2012 normal spirometry-FVC 3.34/107%, FEV1 2.56/100%, FEV1/FEC 0.76, FEF 25-75% 2.20/69%. Allergy Skin Test  10/31/12- Significant Positive for grass, weed tree pollens, dust mite, given allegra at end Unattended Home Sleep Study 11/14/12-  Mild obstructive sleep apnea, AHI 11.2 per hour with desaturation to 67%. Weight 232 pounds.-   We will want to try CPAP when she is able to cope. Nut Mix Allergy Profile 08/12/13- elevated for peanut, walnut, hazelnut, pecan,  --------------------------------------------------------  02/11/15- [name] followed for  OSA complicated by allergic rhinitis, food allergy(peanut/ nut mix), GERD C/o chest tx, hoarseness, wheezing, dizzy, prod cough (foamy clear phlem), nasal cong, PND, chills. Going on x friday.  Nut Mix Allergy Profile 08/12/13- elevated for peanut, walnut, hazelnut, pecan,  She was driving with window open, recognized no smell or specific exposure but began chest tightness, throat tightness and respiratory distress. Still hoarse and intubation at ER where she was treated with Solu-Medrol, Benadryl, Pepcid, EpiPen and nebulizer. She takes Allegra and Benadryl routinely  06/15/2015-51 year old female never smoker followed for allergic rhinitis, food allergy (peanut/nut mix), OSA, complicated by GERD Follows for: Asthma. Pt states that her allergies and asthma are doing well. Pt denies cough/wheeze/SOB/CP/tightness.   06/27/2016-51 year old female never smoker followed for OSA complicated by allergic rhinitis, asthma/vocal cord dysfunction/hyperventilation,  food allergy (peanut/nut mix), ,  GERD  Dr Lucie Leather is now following for  asthma FOLLOWS FOR: No complaints. Pt states that with the weather changes her breathing has been up and down. Pt states that she had an allergic reaction to milk a couple months ago - dizziness, coughing, wheezing, SOB and mouth itching.  Husband complains of her snoring and she agrees now to go forward with treatment for previously documented OSA. We discussed treatment options. Starting CPAP.  ROS-see HPI   + = pos Constitutional:   No-   weight loss, night sweats, fevers, chills, +fatigue, lassitude. HEENT:   No-  headaches, difficulty swallowing, tooth/dental problems, sore throat, +hoarseness      No-  sneezing, itching, no- ear ache, +nasal congestion, +post nasal drip,  CV:  No-   chest pain, orthopnea, PND, swelling in lower extremities, anasarca,  dizziness, palpitations Resp: No-   shortness of breath with exertion or at rest.              No-   productive cough,  + non-productive cough,  No- coughing up of blood.              No-change in color of mucus.   wheezing.   Skin: No-   rash or lesions. GI:  +  heartburn, indigestion, abdominal pain, nausea, vomiting,  GU: n. MS:  No-   joint pain or swelling.   Neuro-  +syncope Psych:  No- change in mood or affect. No depression or anxiety.  No memory loss.  OBJ- Physical Exam General- Alert, Oriented, Affect-appropriate, Distress- none acute Skin- rash-none, lesions- none, excoriation- none Lymphadenopathy- none Head- atraumatic            Eyes- Gross vision intact, PERRLA, conjunctivae and secretions clear            Ears- Hearing, canals-normal            Nose- + sniffing, no-Septal dev, mucus, polyps, erosion,  perforation             Throat- Mallampati III-IV , +mucosa-red, drainage- none, tonsils- atrophic,  Neck- flexible , trachea midline, no stridor , thyroid nl, carotid no bruit Chest - symmetrical excursion , unlabored           Heart/CV- RRR , no murmur , no gallop  , no rub, nl s1 s2                           - JVD-  none , edema- none, stasis changes- none, varices- none           Lung- clear to P&A, cough-none, dullness-none, rub- none           Chest wall-  Abd-  Br/ Gen/ Rectal- Not done, not indicated Extrem- cyanosis- none, clubbing, none, atrophy- none, strength- nl Neuro- grossly intact to observation

## 2016-07-03 NOTE — Assessment & Plan Note (Signed)
She is willing now to go forward with treatment as discussed. Plan-begin CPAP with auto titration 5-20

## 2016-07-03 NOTE — Assessment & Plan Note (Signed)
Now being followed for asthma/allergy and associated issues by her allergy office

## 2016-07-29 ENCOUNTER — Encounter: Payer: Self-pay | Admitting: Internal Medicine

## 2016-08-15 ENCOUNTER — Other Ambulatory Visit: Payer: Self-pay | Admitting: Internal Medicine

## 2016-08-28 ENCOUNTER — Encounter: Payer: Self-pay | Admitting: Internal Medicine

## 2016-09-27 ENCOUNTER — Encounter: Payer: Self-pay | Admitting: Internal Medicine

## 2017-01-21 ENCOUNTER — Emergency Department (HOSPITAL_COMMUNITY)
Admission: EM | Admit: 2017-01-21 | Discharge: 2017-01-22 | Disposition: A | Discharge status: Home / Self Care | Payer: Managed Care, Other (non HMO) | Attending: Emergency Medicine | Admitting: Emergency Medicine

## 2017-01-21 ENCOUNTER — Encounter (HOSPITAL_COMMUNITY): Payer: Self-pay

## 2017-01-21 DIAGNOSIS — Z79899 Other long term (current) drug therapy: Secondary | ICD-10-CM | POA: Diagnosis not present

## 2017-01-21 DIAGNOSIS — Z91010 Allergy to peanuts: Secondary | ICD-10-CM | POA: Diagnosis not present

## 2017-01-21 DIAGNOSIS — T7840XA Allergy, unspecified, initial encounter: Secondary | ICD-10-CM

## 2017-01-21 DIAGNOSIS — R110 Nausea: Secondary | ICD-10-CM | POA: Diagnosis present

## 2017-01-21 DIAGNOSIS — J45909 Unspecified asthma, uncomplicated: Secondary | ICD-10-CM | POA: Insufficient documentation

## 2017-01-21 MED ORDER — FAMOTIDINE IN NACL 20-0.9 MG/50ML-% IV SOLN
20.0000 mg | Freq: Once | INTRAVENOUS | Status: AC
  Administered 2017-01-21: 20 mg via INTRAVENOUS
  Filled 2017-01-21: qty 50

## 2017-01-21 MED ORDER — METHYLPREDNISOLONE SODIUM SUCC 125 MG IJ SOLR
125.0000 mg | Freq: Once | INTRAMUSCULAR | Status: AC
  Administered 2017-01-21: 125 mg via INTRAVENOUS
  Filled 2017-01-21: qty 2

## 2017-01-21 MED ORDER — ONDANSETRON HCL 4 MG/2ML IJ SOLN
4.0000 mg | Freq: Once | INTRAMUSCULAR | Status: AC
Start: 2017-01-21 — End: 2017-01-21
  Administered 2017-01-21: 4 mg via INTRAVENOUS
  Filled 2017-01-21: qty 2

## 2017-01-21 NOTE — ED Triage Notes (Signed)
Hx of peanut allergy and come into contact with peanuts and throat swelling and scratchy went to fire dept and gave benadryl 50mg  PO and epi pen pta to ER no respiratory or acute distress noted no drooling noted.

## 2017-01-21 NOTE — ED Provider Notes (Signed)
[facility]-EMERGENCY DEPT Provider Note   CSN: [id number] Arrival date & time: 01/21/17  2130     History   Chief Complaint Chief Complaint  Patient presents with  . Allergic Reaction    HPI Megan Wilkerson is a 51 y.o. female with a history of anaphylaxis to peanuts presents for allergy after being exposed to peanuts at the grocery store.  She reports that her husband gave her her epi-pen after She began to feel like her throat was swelling and scratchy.  She had 50 mg Benadryl prior to arrival.  At the time of my evaluation she currently denies any shortness of breath, difficulty breathing, states that her voice is hoarse which is normal for her after a allergic reaction.  She denies feeling like her throat is swelling, feeling lightheaded or dizzy. She does endorse mild nausea.  Chart review shows that she has had multiple anaphylactic reactions in the past, and she states that this feels like her normal allergic reactions.  HPI  Past Medical History:  Diagnosis Date  . Anaphylactic reaction    several ED visits for same since 01/2012  . Asthma   . GERD   . Hives of unknown origin    chronic and recurrent urticaria since 01/2012  . HYPERLIPIDEMIA, WITH LOW HDL   . MIGRAINE HEADACHE   . PERIPHERAL NEUROPATHY   . POSITIVE PPD 1987   s/p 72mo tx    Patient Active Problem List   Diagnosis Date Noted  . Routine general medical examination at a health care facility 07/30/2015  . Sinusitis 08/29/2014  . Obese 07/23/2014  . Syncope 02/11/2013  . Hoarseness 10/25/2012  . Allergic rhinitis 10/25/2012  . Obstructive sleep apnea 10/25/2012  . 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 Allergic reaction 03/13/2012  . Asthma with bronchitis 01/12/2011  . PERIPHERAL NEUROPATHY 01/14/2010  . MAMMOGRAM, ABNORMAL, LEFT 04/22/2009  . HYPERLIPIDEMIA, WITH LOW HDL 09/03/2008  . ANEMIA-NOS 09/03/2008  . MIGRAINE HEADACHE 09/03/2008  . GERD 09/03/2008  . PPD positive 09/03/2008    Past Surgical  History:  Procedure Laterality Date  . ABDOMINAL HYSTERECTOMY  1996  . APPENDECTOMY  1992  . CESAREAN SECTION  1989  . CHOLECYSTECTOMY  04/22/2011   Procedure: LAPAROSCOPIC CHOLECYSTECTOMY WITH INTRAOPERATIVE CHOLANGIOGRAM;  Surgeon: Almond Lint, MD;  Location: [facility] OR;  Service: General;  Laterality: N/A;  . CYST REMOVAL HAND Right 1997    OB History    Gravida Para Term Preterm AB Living   5 5       5    SAB TAB Ectopic Multiple Live Births                   Home Medications    Prior to Admission medications   Medication Sig Start Date End Date Taking? Authorizing Provider  albuterol (PROVENTIL) (2.5 MG/3ML) 0.083% nebulizer solution Take 3 mLs (2.5 mg total) by nebulization every 6 (six) hours as needed for wheezing or shortness of breath. 02/11/15   Jetty Duhamel D, MD  brimonidine (ALPHAGAN) 0.15 % ophthalmic solution Place 1 drop into both eyes 2 (two) times daily.    [provider]  budesonide-formoterol (SYMBICORT) 160-4.5 MCG/ACT inhaler Inhale two puffs twice daily to prevent cough or wheeze. Use with spacer.  Rinse, gargle, and spit after use. 03/09/16   Kozlow, Alvira Philips, MD  dexlansoprazole (DEXILANT) 60 MG capsule Take one capsule every morning before breakfast 03/09/16   Kozlow, Alvira Philips, MD  EPINEPHrine 0.3 mg/0.3 mL IJ SOAJ injection  Inject 0.3 mLs (0.3 mg total) into the muscle once. 08/21/15   Lorre Nick, MD  famotidine (PEPCID) 20 MG tablet Take 1 tablet (20 mg total) by mouth 2 (two) times daily. 10/01/15   Gwyneth Sprout, MD  fexofenadine (ALLEGRA) 180 MG tablet Take 180 mg by mouth daily.    [provider]  fluticasone (FLONASE) 50 MCG/ACT nasal spray PLACE 2 SPRAYS INTO BOTH NOSTRILS DAILY. 08/15/16   Myrlene Broker, MD  hydrOXYzine (ATARAX/VISTARIL) 25 MG tablet Take 1 tablet (25 mg total) by mouth 3 (three) times daily as needed for itching. 02/15/16   [place], Betty G, MD  montelukast (SINGULAIR) 10 MG tablet Take one tablet once  daily Patient taking differently: Take 10 mg by mouth as needed. Take one tablet once daily 03/09/16   Kozlow, Alvira Philips, MD  Nebulizers (COMPRESSOR/NEBULIZER) MISC As directed 02/11/15   Jetty Duhamel D, MD  Pyridoxine HCl (B-6 PO) Take 1 tablet by mouth daily at 12 noon.    [provider]  ranitidine (ZANTAC) 300 MG tablet Take one tablet each evening 03/09/16   Kozlow, Alvira Philips, MD    Family History Family History  Problem Relation Age of Onset  . Prostate cancer Father   . Hypertension Father   . Heart disease Father   . Diabetes Mother   . Diabetes Brother   . Alcohol abuse Other   . Arthritis Other   . Hypertension Other     Social History Social History  Substance Use Topics  . Smoking status: Never Smoker  . Smokeless tobacco: Never Used  . Alcohol use No     Allergies   Milk-related compounds; Other; Peanuts [peanut oil]; Corn dextrin; Gluten meal; Shellfish allergy; Soy allergy; Sunflower oil; Tomato; Wheat bran; and Morphine   Review of Systems Review of Systems  Constitutional: Negative for chills, fatigue and fever.  HENT: Positive for voice change (Hoarse). Negative for ear pain, facial swelling and sore throat.   Eyes: Negative for pain and visual disturbance.  Respiratory: Negative for cough, chest tightness, shortness of breath and wheezing.   Cardiovascular: Negative for chest pain and palpitations.  Gastrointestinal: Positive for nausea. Negative for abdominal pain and vomiting.  Genitourinary: Negative for dysuria and hematuria.  Musculoskeletal: Negative for arthralgias, back pain and neck stiffness.  Skin: Negative for color change, pallor, rash and wound.  Neurological: Negative for seizures, syncope, light-headedness and headaches.  All other systems reviewed and are negative.    Physical Exam Updated Vital Signs BP (!) 125/104 (BP Location: Left Arm)   Pulse 70   Temp 98.6 F (37 C) (Oral)   Resp 16   Ht 5\' 6"  (1.676 m)   Wt 106.6  kg (235 lb)   SpO2 100%   BMI 37.93 kg/m   Physical Exam  Constitutional: She appears well-developed and well-nourished. No distress.  HENT:  Head: Normocephalic and atraumatic.  Mouth/Throat: Oropharynx is clear and moist.  No obvious swelling to tongue, oropharynx. No elevation to floor of mouth. Uvula is midline.  Voice is hoarse  Eyes: Conjunctivae are normal. Right eye exhibits no discharge. Left eye exhibits no discharge. No scleral icterus.  Neck: Normal range of motion. Neck supple.  Cardiovascular: Normal rate, regular rhythm and intact distal pulses.   No murmur heard. Pulmonary/Chest: Effort normal and breath sounds normal. No stridor. No respiratory distress. She has no wheezes. She has no rales.  Abdominal: Soft. Bowel sounds are normal. She exhibits no distension. There is no tenderness.  Musculoskeletal: She exhibits no edema or deformity.  Neurological: She is alert. She exhibits normal muscle tone.  Skin: Skin is warm and dry. She is not diaphoretic.  Psychiatric: She has a normal mood and affect. Her behavior is normal.  Nursing note and vitals reviewed.    ED Treatments / Results  Labs (all labs ordered are listed, but only abnormal results are displayed) Labs Reviewed - No data to display  EKG  EKG Interpretation None       Radiology No results found.  Procedures Procedures (including critical care time)  Medications Ordered in ED Medications  methylPREDNISolone sodium succinate (SOLU-MEDROL) 125 mg/2 mL injection 125 mg (125 mg Intravenous Given 01/21/17 2324)  famotidine (PEPCID) IVPB 20 mg premix (0 mg Intravenous Stopped 01/22/17 0020)  ondansetron (ZOFRAN) injection 4 mg (4 mg Intravenous Given 01/21/17 2324)     Initial Impression / Assessment and Plan / ED Course  I have reviewed the triage vital signs and the nursing notes.  Pertinent labs & imaging results that were available during my care of the patient were reviewed by me and  considered in my medical decision making (see chart for details).    At shift change care was transferred to Sharen Heck PA-C who will follow pending studies, re-evaulate and determine disposition.      Final Clinical Impressions(s) / ED Diagnoses   Final diagnoses:  Anaphylaxis, initial encounter    New Prescriptions New Prescriptions   No medications on file     Norman Clay 01/22/17 Elease Hashimoto, MD 01/28/17 [phone]

## 2017-01-22 MED ORDER — FAMOTIDINE 20 MG PO TABS: 20.0000 mg | ORAL_TABLET | Freq: Two times a day (BID) | ORAL | 0 refills | Status: DC

## 2017-01-22 MED ORDER — EPINEPHRINE 0.3 MG/0.3ML IJ SOAJ: 0.3000 mg | Freq: Once | INTRAMUSCULAR | 2 refills | Status: DC

## 2017-01-22 MED ORDER — METHYLPREDNISOLONE 4 MG PO TBPK: ORAL_TABLET | ORAL | 0 refills | Status: DC

## 2017-01-22 NOTE — ED Provider Notes (Signed)
Patient was handed off to me by previous EDP Jeraldine Loots at shift change pending re-evaluation after 4 hr of epi, solumedrol, pepcid after exposure to peanuts. Please see previous note for full HPI and ROS. Briefly, pt is a 51 year old female with h/o anaphylaxis to peanuts who presents to ED after exposure to peanuts at grocery store and reports sensation of throat swelling and scratching. Self administered epi-pen and benadryl PTA.   On my exam, VS are WNL. No facial or neck angioedema. No wheezing, stridor, generalized rash, abdominal pain, nausea, vomiting or hypotension. Her voice is hoarse, which she states it is her normal allergic reactions, states this has gotten better since arrival.   Pt considered safe for d/c with steroids, pepcid, epi-pen rx. Discussed s/s that would warrant return to ED for re-evaluation. Pt and family at bedside agreeable with ED tx and discharge plan.    Liberty Handy, PA-C 01/22/17 0130    Palumbo, April, MD 01/22/17 [phone]

## 2017-01-22 NOTE — Discharge Instructions (Signed)
You received epi-pen, steroids and anti-histamines in the ED. Your symptoms have improved.   Please take steroids and pepcid as prescribed. Use epi-pen whenever exposed to peanuts.   Return for any shortness of breath, nausea, vomiting, facial/lip/tongue swelling, difficulty breathing, light-headedness, abdominal pain

## 2017-02-02 ENCOUNTER — Other Ambulatory Visit: Payer: Self-pay | Admitting: Allergy and Immunology

## 2017-06-21 ENCOUNTER — Ambulatory Visit: Payer: Managed Care, Other (non HMO) | Admitting: Internal Medicine

## 2017-06-21 ENCOUNTER — Encounter: Payer: Self-pay | Admitting: Internal Medicine

## 2017-06-21 DIAGNOSIS — R6889 Other general symptoms and signs: Secondary | ICD-10-CM

## 2017-06-21 MED ORDER — PREDNISONE 20 MG PO TABS
40.0000 mg | ORAL_TABLET | Freq: Every day | ORAL | 0 refills | Status: DC
Start: 2017-06-21 — End: 2017-11-27

## 2017-06-21 MED ORDER — ONDANSETRON HCL 4 MG PO TABS: 4.0000 mg | ORAL_TABLET | Freq: Three times a day (TID) | ORAL | 0 refills | Status: DC | PRN

## 2017-06-21 MED ORDER — BENZONATATE 200 MG PO CAPS: 200.0000 mg | ORAL_CAPSULE | Freq: Three times a day (TID) | ORAL | 0 refills | Status: DC | PRN

## 2017-06-21 NOTE — Patient Instructions (Signed)
We have sent in prednisone to take 2 pills daily for 5 days.  We have sent in the tessalon perles for the cough up to 3 times per day.  We have sent in zofran for nausea that you can use as needed.

## 2017-06-21 NOTE — Progress Notes (Signed)
Subjective:    Patient ID: Megan Wilkerson, female    DOB: 1965-12-17, 52 y.o.   MRN: [id number]  HPI The patient is a 52 YO female coming in for fevers, chills, headaches, nausea. Started about 4 days ago. She has been taking her allegra and flonase. She has concurrent asthma and is having more SOB. She is coughing up green sputum. Still taking her symbicort and has used albuterol some extra lately. She has been around sick contacts. She did try some otc cold medication and did not feel like it helped. She is not eating well due to nausea but denies vomiting.   Review of Systems  Constitutional: Positive for activity change, appetite change and chills. Negative for fatigue, fever and unexpected weight change.  HENT: Positive for congestion, postnasal drip, rhinorrhea and sinus pressure. Negative for ear discharge, ear pain, sinus pain, sneezing, sore throat, tinnitus, trouble swallowing and voice change.   Eyes: Negative.   Respiratory: Positive for cough and shortness of breath. Negative for chest tightness and wheezing.   Cardiovascular: Negative.   Gastrointestinal: Negative.   Musculoskeletal: Positive for myalgias.  Neurological: Negative.       Objective:   Physical Exam  Constitutional: She is oriented to person, place, and time. She appears well-developed and well-nourished.  HENT:  Head: Normocephalic and atraumatic.  Oropharynx with redness and clear drainage, nose with swollen turbinates, TMs normal bilaterally  Eyes: EOM are normal.  Neck: Normal range of motion. No thyromegaly present.  Cardiovascular: Normal rate and regular rhythm.  Pulmonary/Chest: Effort normal. No respiratory distress. She has wheezes. She has no rales.  Some wheezing which does not clear with coughing  Abdominal: Soft.  Musculoskeletal: She exhibits tenderness.  Lymphadenopathy:    She has no cervical adenopathy.  Neurological: She is alert and oriented to person, place, and time.  Skin: Skin  is warm and dry.   Vitals:   06/21/17 0927  BP: 110/80  Pulse: (!) 103  Temp: 98.7 F (37.1 C)  TempSrc: Oral  SpO2: 98%  Weight: 218 lb (98.9 kg)  Height: 5\' 6"  (1.676 m)      Assessment & Plan:

## 2017-06-23 DIAGNOSIS — R6889 Other general symptoms and signs: Secondary | ICD-10-CM | POA: Insufficient documentation

## 2017-06-23 NOTE — Assessment & Plan Note (Addendum)
Suspect flu or viral illness caused asthma exacerbation. Rx for prednisone and talked to her about typical course for flu illness. Can use otc meds for symptoms. Continue allegra and flonase. Rx for zofran for nausea and tessalon perles for cough.

## 2017-11-27 ENCOUNTER — Other Ambulatory Visit: Payer: Self-pay

## 2017-11-27 ENCOUNTER — Emergency Department (HOSPITAL_COMMUNITY)
Admission: EM | Admit: 2017-11-27 | Discharge: 2017-11-28 | Disposition: A | Discharge status: Home / Self Care | Payer: Managed Care, Other (non HMO) | Attending: Emergency Medicine | Admitting: Emergency Medicine

## 2017-11-27 DIAGNOSIS — Z91010 Allergy to peanuts: Secondary | ICD-10-CM | POA: Insufficient documentation

## 2017-11-27 DIAGNOSIS — Z79899 Other long term (current) drug therapy: Secondary | ICD-10-CM | POA: Insufficient documentation

## 2017-11-27 DIAGNOSIS — T7840XA Allergy, unspecified, initial encounter: Secondary | ICD-10-CM | POA: Diagnosis present

## 2017-11-27 DIAGNOSIS — J45909 Unspecified asthma, uncomplicated: Secondary | ICD-10-CM | POA: Diagnosis not present

## 2017-11-27 MED ORDER — METHYLPREDNISOLONE SODIUM SUCC 125 MG IJ SOLR
125.0000 mg | Freq: Once | INTRAMUSCULAR | Status: AC
  Administered 2017-11-27: 125 mg via INTRAVENOUS
  Filled 2017-11-27: qty 2

## 2017-11-27 MED ORDER — EPINEPHRINE 0.3 MG/0.3ML IJ SOAJ: 0.3000 mg | Freq: Once | INTRAMUSCULAR | 2 refills | Status: DC

## 2017-11-27 MED ORDER — RACEPINEPHRINE HCL 2.25 % IN NEBU
0.5000 mL | INHALATION_SOLUTION | Freq: Once | RESPIRATORY_TRACT | Status: AC
  Administered 2017-11-27: 0.5 mL via RESPIRATORY_TRACT
  Filled 2017-11-27: qty 0.5

## 2017-11-27 MED ORDER — RANITIDINE HCL 150 MG PO TABS
150.0000 mg | ORAL_TABLET | Freq: Once | ORAL | Status: AC
  Administered 2017-11-27: 150 mg via ORAL
  Filled 2017-11-27: qty 1

## 2017-11-27 MED ORDER — PREDNISONE 20 MG PO TABS: 40.0000 mg | ORAL_TABLET | Freq: Every day | ORAL | 0 refills | Status: DC

## 2017-11-27 MED ORDER — DIPHENHYDRAMINE HCL 50 MG/ML IJ SOLN
25.0000 mg | Freq: Once | INTRAMUSCULAR | Status: AC
  Administered 2017-11-27: 25 mg via INTRAMUSCULAR
  Filled 2017-11-27: qty 1

## 2017-11-27 NOTE — ED Provider Notes (Signed)
[facility]-EMERGENCY DEPT Provider Note   CSN: [id number] Arrival date & time: 11/27/17  2017     History   Chief Complaint Chief Complaint  Patient presents with  . Allergic Reaction    HPI Megan Wilkerson is a 52 y.o. female.  HPI Patient presents with possible allergic reaction.  States that she was at church and someone came by with fish.  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  Had EpiPen given prior to arrival.  Has been stable since the event prior to arrival. Past Medical History:  Diagnosis Date  . Anaphylactic reaction    several ED visits for same since 01/2012  . Asthma   . GERD   . Hives of unknown origin    chronic and recurrent urticaria since 01/2012  . HYPERLIPIDEMIA, WITH LOW HDL   . MIGRAINE HEADACHE   . PERIPHERAL NEUROPATHY   . POSITIVE PPD 1987   s/p 84mo tx    Patient Active Problem List   Diagnosis Date Noted  . Flu-like symptoms 06/23/2017  . Routine general medical examination at a health care facility 07/30/2015  . Sinusitis 08/29/2014  . Obese 07/23/2014  . Syncope 02/11/2013  . Hoarseness 10/25/2012  . Allergic rhinitis 10/25/2012  . Obstructive sleep apnea 10/25/2012  . 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 Allergic reaction 03/13/2012  . Asthma with bronchitis 01/12/2011  . PERIPHERAL NEUROPATHY 01/14/2010  . MAMMOGRAM, ABNORMAL, LEFT 04/22/2009  . HYPERLIPIDEMIA, WITH LOW HDL 09/03/2008  . ANEMIA-NOS 09/03/2008  . MIGRAINE HEADACHE 09/03/2008  . GERD 09/03/2008  . PPD positive 09/03/2008    Past Surgical History:  Procedure Laterality Date  . ABDOMINAL HYSTERECTOMY  1996  . APPENDECTOMY  1992  . CESAREAN SECTION  1989  . CHOLECYSTECTOMY  04/22/2011   Procedure: LAPAROSCOPIC CHOLECYSTECTOMY WITH INTRAOPERATIVE CHOLANGIOGRAM;  Surgeon: Almond Lint, MD;  Location: [facility] OR;  Service: General;  Laterality:  N/A;  . CYST REMOVAL HAND Right 1997     OB History    Gravida  5   Para  5   Term      Preterm      AB      Living  5     SAB      TAB      Ectopic      Multiple      Live Births               Home Medications    Prior to Admission medications   Medication Sig Start Date End Date Taking? Authorizing Provider  albuterol (PROVENTIL) (2.5 MG/3ML) 0.083% nebulizer solution Take 3 mLs (2.5 mg total) by nebulization every 6 (six) hours as needed for wheezing or shortness of breath. 02/11/15   Waymon Budge, MD  benzonatate (TESSALON) 200 MG capsule Take 1 capsule (200 mg total) by mouth 3 (three) times daily as needed for cough. 06/21/17   Myrlene Broker, MD  brimonidine (ALPHAGAN) 0.15 % ophthalmic solution Place 1 drop into both eyes 2 (two) times daily.    [provider]  budesonide-formoterol (SYMBICORT) 160-4.5 MCG/ACT inhaler Inhale two puffs twice daily to prevent cough or wheeze. Use with spacer.  Rinse, gargle, and spit after use. 03/09/16   Kozlow, Alvira Philips, MD  dexlansoprazole (DEXILANT) 60 MG capsule Take one capsule every morning before breakfast 03/09/16   Kozlow, Alvira Philips, MD  EPINEPHrine 0.3 mg/0.3 mL IJ SOAJ injection Inject 0.3 mLs (0.3 mg total) into the muscle once for 1 dose. 11/27/17 11/27/17  Benjiman CorePickering, Zale Marcotte, MD  famotidine (PEPCID) 20 MG tablet Take 1 tablet (20 mg total) by mouth 2 (two) times daily. 01/22/17   Liberty HandyGibbons, Claudia J, PA-C  fexofenadine (ALLEGRA) 180 MG tablet Take 180 mg by mouth daily.    [provider]  fluticasone (FLONASE) 50 MCG/ACT nasal spray PLACE 2 SPRAYS INTO BOTH NOSTRILS DAILY. 08/15/16   Myrlene Brokerrawford, Elizabeth A, MD  hydrOXYzine (ATARAX/VISTARIL) 25 MG tablet Take 1 tablet (25 mg total) by mouth 3 (three) times daily as needed for itching. 02/15/16   [place]Jordan, Betty G, MD  methylPREDNISolone (MEDROL DOSEPAK) 4 MG TBPK tablet Take until completed 01/22/17   Liberty HandyGibbons, Claudia J, PA-C  montelukast (SINGULAIR) 10  MG tablet TAKE ONE TABLET BY MOUTH DAILY 02/02/17   Kozlow, Alvira PhilipsEric J, MD  Nebulizers (COMPRESSOR/NEBULIZER) MISC As directed 02/11/15   Waymon BudgeYoung, Clinton D, MD  ondansetron (ZOFRAN) 4 MG tablet Take 1 tablet (4 mg total) by mouth every 8 (eight) hours as needed for nausea or vomiting. 06/21/17   Myrlene Brokerrawford, Elizabeth A, MD  predniSONE (DELTASONE) 20 MG tablet Take 2 tablets (40 mg total) by mouth daily with breakfast. 11/27/17   Benjiman CorePickering, Selah Zelman, MD  Pyridoxine HCl (B-6 PO) Take 1 tablet by mouth daily at 12 noon.    [provider]  ranitidine (ZANTAC) 300 MG tablet Take one tablet each evening 03/09/16   Kozlow, Alvira PhilipsEric J, MD    Family History Family History  Problem Relation Age of Onset  . Prostate cancer Father   . Hypertension Father   . Heart disease Father   . Diabetes Mother   . Diabetes Brother   . Alcohol abuse Other   . Arthritis Other   . Hypertension Other     Social History Social History   Tobacco Use  . Smoking status: Never Smoker  . Smokeless tobacco: Never Used  Substance Use Topics  . Alcohol use: No  . Drug use: No     Allergies   Milk-related compounds; Other; Peanuts [peanut oil]; Corn dextrin; Gluten meal; Shellfish allergy; Soy allergy; Sunflower oil; Tomato; Wheat bran; and Morphine   Review of Systems Review of Systems  Constitutional: Negative for fatigue and fever.  HENT: Positive for voice change. Negative for trouble swallowing.   Respiratory: Positive for shortness of breath. Negative for wheezing and stridor.   Gastrointestinal: Negative for abdominal pain.  Genitourinary: Negative for flank pain.  Musculoskeletal: Negative for back pain.  Skin: Positive for rash.  Neurological: Negative for syncope and weakness.  Psychiatric/Behavioral: Negative for confusion.     Physical Exam Updated Vital Signs BP 127/87   Pulse 75   Temp 98.7 F (37.1 C) (Oral)   Resp 14   SpO2 100%   Physical Exam  Constitutional: She appears  well-developed.  HENT:  Head: Normocephalic.  Eyes: Pupils are equal, round, and reactive to light.  Neck: Neck supple.  Cardiovascular: Normal rate.  Pulmonary/Chest:  Patient has a harsh voice.  Mildly quiet.  No stridor however.  No wheezes.  No respiratory distress.  Abdominal: Soft.  Musculoskeletal: She exhibits no edema.  Neurological: She is alert.  Skin: Skin is warm. Capillary refill takes less than 2 seconds.  Psychiatric: She has a normal mood and affect.     ED Treatments / Results  Labs (all labs ordered are listed, but only abnormal results are displayed) Labs  Reviewed - No data to display  EKG None  Radiology No results found.  Procedures Procedures (including critical care time)  Medications Ordered in ED Medications  diphenhydrAMINE (BENADRYL) injection 25 mg (25 mg Intramuscular Given 11/27/17 2145)  ranitidine (ZANTAC) tablet 150 mg (150 mg Oral Given 11/27/17 2145)  methylPREDNISolone sodium succinate (SOLU-MEDROL) 125 mg/2 mL injection 125 mg (125 mg Intravenous Given 11/27/17 2215)  Racepinephrine HCl 2.25 % nebulizer solution 0.5 mL (0.5 mLs Nebulization Given 11/27/17 2224)     Initial Impression / Assessment and Plan / ED Course  I have reviewed the triage vital signs and the nursing notes.  Pertinent labs & imaging results that were available during my care of the patient were reviewed by me and considered in my medical decision making (see chart for details).     Patient with reaction to fish.  Has known allergy.  Has been monitored for 3 hours so far.  Initially had a change in voice and given racemic epi.  Also it had her own EpiPen given while at the church.  Final Clinical Impressions(s) / ED Diagnoses   Final diagnoses:  Allergic reaction, initial encounter    ED Discharge Orders        Ordered    predniSONE (DELTASONE) 20 MG tablet  Daily with breakfast     11/27/17 2301    EPINEPHrine 0.3 mg/0.3 mL IJ SOAJ injection   Once      11/27/17 2303       Benjiman Core, MD 11/27/17 2325

## 2017-11-27 NOTE — ED Triage Notes (Signed)
[facility] who was eating fish and pt states she is highly allergic to fish and she began to itch, wheeze and LOC. EMS was called and epi-pen adminstered pt came to ED on arrival by POV pt lungs are clear but she states throat feels tight and she continues to itch.

## 2017-11-28 NOTE — ED Provider Notes (Signed)
Patient here with allergic reaction and possible anaphylaxis status post IM epi and racemic epi.  Plan was to monitor the patient for additional 1 to 2 hours.  At the 5-hour mark, patient was stable without any oral swelling.  No respiratory distress.  Requesting discharge.  The patient is safe for discharge with strict return precautions.   Disposition: Discharge  Condition: Good  I have discussed the results, Dx and Tx plan with the patient who expressed understanding and agree(s) with the plan. Discharge instructions discussed at great length. The patient was given strict return precautions who verbalized understanding of the instructions. No further questions at time of discharge.    ED Discharge Orders        Ordered    predniSONE (DELTASONE) 20 MG tablet  Daily with breakfast     11/27/17 2301    EPINEPHrine 0.3 mg/0.3 mL IJ SOAJ injection   Once     11/27/17 2303       Follow Up: Myrlene Brokerrawford, Elizabeth A, MD [address] [place] [id number] [phone]   As needed      Nira Connardama, Pedro Eduardo, MD 11/28/17 [phone]

## 2017-12-04 ENCOUNTER — Ambulatory Visit: Payer: Self-pay | Admitting: *Deleted

## 2017-12-04 ENCOUNTER — Encounter: Payer: Self-pay | Admitting: Internal Medicine

## 2017-12-04 ENCOUNTER — Ambulatory Visit: Payer: Managed Care, Other (non HMO) | Admitting: Internal Medicine

## 2017-12-04 VITALS — BP 138/92 | HR 75 | Temp 98.3°F | Ht 66.0 in | Wt 225.0 lb

## 2017-12-04 DIAGNOSIS — J45909 Unspecified asthma, uncomplicated: Secondary | ICD-10-CM | POA: Diagnosis not present

## 2017-12-04 DIAGNOSIS — T7840XA Allergy, unspecified, initial encounter: Secondary | ICD-10-CM

## 2017-12-04 DIAGNOSIS — T7800XD Anaphylactic reaction due to unspecified food, subsequent encounter: Secondary | ICD-10-CM | POA: Diagnosis not present

## 2017-12-04 DIAGNOSIS — J0110 Acute frontal sinusitis, unspecified: Secondary | ICD-10-CM | POA: Diagnosis not present

## 2017-12-04 MED ORDER — FLUTICASONE PROPIONATE 50 MCG/ACT NA SUSP: 2.0000 | Freq: Every day | NASAL | 5 refills | Status: DC

## 2017-12-04 MED ORDER — BUDESONIDE-FORMOTEROL FUMARATE 160-4.5 MCG/ACT IN AERO: INHALATION_SPRAY | RESPIRATORY_TRACT | 5 refills | Status: DC

## 2017-12-04 MED ORDER — AMOXICILLIN-POT CLAVULANATE 875-125 MG PO TABS: 1.0000 | ORAL_TABLET | Freq: Two times a day (BID) | ORAL | 0 refills | Status: DC

## 2017-12-04 MED ORDER — MONTELUKAST SODIUM 10 MG PO TABS: 10.0000 mg | ORAL_TABLET | Freq: Every day | ORAL | 3 refills | Status: DC

## 2017-12-04 MED ORDER — IPRATROPIUM-ALBUTEROL 0.5-2.5 (3) MG/3ML IN SOLN
3.0000 mL | Freq: Once | RESPIRATORY_TRACT | Status: AC
  Administered 2017-12-04: 3 mL via RESPIRATORY_TRACT

## 2017-12-04 NOTE — Progress Notes (Signed)
Subjective:    Patient ID: Megan Wilkerson, female    DOB: 1965/06/25, 52 y.o.   MRN: [id number]  HPI The patient is a 52 YO female coming in for cough, SOB, wheezing. Started about 1 week ago when she was exposed to fish odor which she is highly allergic against. She went to ER and was given IV steroids, epi, allergy medicine in the ER and prescription for prednisone. She has been taking prednisone 40 mg daily for 1 week and is not feeling any improvement. She is still some itchy. She denies welts and denies throat closing. She is eating and drinking okay. Feels very tired. Sinus pain and allergy drainage. She is out of all of her allergy medicine currently. Has an inhaler for daily but cannot use it well due to SOB. She is using albuterol 2-4 times per day and this lasts for 1-2 hours where she can breathe. Last used last night. She is overall worsening and is concerned.   Review of Systems  Constitutional: Positive for activity change, appetite change and fatigue. Negative for chills, fever and unexpected weight change.  HENT: Positive for congestion, postnasal drip, rhinorrhea, sinus pressure, sinus pain and sore throat. Negative for ear discharge, ear pain, sneezing, tinnitus, trouble swallowing and voice change.   Eyes: Negative.   Respiratory: Positive for cough, shortness of breath and wheezing. Negative for chest tightness.   Cardiovascular: Negative.  Negative for chest pain, palpitations and leg swelling.  Gastrointestinal: Positive for nausea. Negative for abdominal distention, abdominal pain, constipation, diarrhea and vomiting.  Musculoskeletal: Negative for myalgias.  Skin: Negative.   Allergic/Immunologic: Positive for environmental allergies and food allergies.  Neurological: Negative.   Hematological: Negative.   Psychiatric/Behavioral: Negative.       Objective:   Physical Exam  Constitutional: She is oriented to person, place, and time. She appears well-developed and  well-nourished.  HENT:  Head: Normocephalic and atraumatic.  Sinus pain frontal, oropharynx red and clear drainage, nose redness and swelling turbinates, TMs normal  Eyes: EOM are normal.  Neck: Normal range of motion.  Cardiovascular: Normal rate and regular rhythm.  Pulmonary/Chest: Effort normal. No respiratory distress. She has wheezes. She has no rales.  Some decreased air movement, mild SOB with talking able to talk in full sentences, after duo-neb air flow improved  Abdominal: Soft. Bowel sounds are normal. She exhibits no distension. There is no tenderness. There is no rebound.  Musculoskeletal: She exhibits no edema.  Neurological: She is alert and oriented to person, place, and time. Coordination normal.  Skin: Skin is warm and dry.  Psychiatric: She has a normal mood and affect.   Vitals:   12/04/17 0952  BP: (!) 138/92  Pulse: 75  Temp: 98.3 F (36.8 C)  TempSrc: Oral  SpO2: 99%  Weight: 225 lb (102.1 kg)  Height: 5\' 6"  (1.676 m)      Assessment & Plan:  Duo-neb given at visit

## 2017-12-04 NOTE — Assessment & Plan Note (Signed)
Suspect this is worsening allergic reaction. Rx for augmentin, singulair, and flonase to take.

## 2017-12-04 NOTE — Assessment & Plan Note (Signed)
Rx for flonase and singulair to start taking today.

## 2017-12-04 NOTE — Assessment & Plan Note (Signed)
Rx for augmentin and she will continue prednisone for another week. She is off allergy medications which is causing some of this flare as well as allergy reaction 1 week ago. She will use symbicort (rx sent in) and albuterol nebulizer treatments (has at home already).

## 2017-12-04 NOTE — Telephone Encounter (Signed)
noted 

## 2017-12-04 NOTE — Assessment & Plan Note (Signed)
She has exposure to fish without response to treatment thus far. She will continue prednisone for an additional week. Given duo-neb during the visit today which did help. She will use albuterol nebulizer at home until breathing improves. She is off her allergy medicines and this could be causing reaction to not improve.

## 2017-12-04 NOTE — Patient Instructions (Addendum)
We have sent in flonase and singulair for the allergies. I think that some allergy drainage is causing some of the hoarseness. Start taking these today.   Continue taking the prednisone. Take 2 pills daily for 5 days, then take 1 pill daily for 3 days then stop.   We have sent in augmentin to take 1 pill twice a day for 10 days to clear the sinuses and lungs. If you are not feeling better let us know.  Use the nebulizer until the breathing is better so that it can work better since you cannot inhale yet.

## 2017-12-04 NOTE — Telephone Encounter (Signed)
Patient was seen in the ER for an allergic reaction to Just inhaling the fish odor on 11/27/17. She was prescribed prednisone 20 MG daily which she has been taking. She has had this occur before. She has asthma and has been using both inhalers every 2-4 hours daily since then. Noted SOB with our conversation and voice change. She has a cough with thick, clear sputum. She stated she just isn't feeling much better while on the medication. She wishes to see Dr. Okey Duprerawford. Appointment made for this morning.  Reason for Disposition . [1] MILD difficulty breathing (e.g., minimal/no SOB at rest, SOB with walking, pulse <100) AND [2] NEW-onset or WORSE than normal  Answer Assessment - Initial Assessment Questions 1. RESPIRATORY STATUS: "Describe your breathing?" (e.g., wheezing, shortness of breath, unable to speak, severe coughing)      Wheezing, coughing , SOB with talking. 2. ONSET: "When did this breathing problem begin?"      July 1st 3. PATTERN "Does the difficult breathing come and go, or has it been constant since it started?"      constant 4. SEVERITY: "How bad is your breathing?" (e.g., mild, moderate, severe)    - MILD: No SOB at rest, mild SOB with walking, speaks normally in sentences, can lay down, no retractions, pulse < 100.    - MODERATE: SOB at rest, SOB with minimal exertion and prefers to sit, cannot lie down flat, speaks in phrases, mild retractions, audible wheezing, pulse 100-120.    - SEVERE: Very SOB at rest, speaks in single words, struggling to breathe, sitting hunched forward, retractions, pulse > 120      severe 5. RECURRENT SYMPTOM: "Have you had difficulty breathing before?" If so, ask: "When was the last time?" and "What happened that time?"      Yes but no this bad 6. CARDIAC HISTORY: "Do you have any history of heart disease?" (e.g., heart attack, angina, bypass surgery, angioplasty)     no 7. LUNG HISTORY: "Do you have any history of lung disease?"  (e.g., pulmonary  embolus, asthma, emphysema)    asthma 8. CAUSE: "What do you think is causing the breathing problem?"      Allergy to fish one week ago continues 9. OTHER SYMPTOMS: "Do you have any other symptoms? (e.g., dizziness, runny nose, cough, chest pain, fever)    Dizziness with sitting and walking, heavy chest with resting and activity 10. PREGNANCY: "Is there any chance you are pregnant?" "When was your last menstrual period?"       no 11. TRAVEL: "Have you traveled out of the country in the last month?" (e.g., travel history, exposures)       no  Protocols used: BREATHING DIFFICULTY-A-AH

## 2017-12-08 ENCOUNTER — Encounter: Payer: Self-pay | Admitting: Internal Medicine

## 2017-12-08 ENCOUNTER — Ambulatory Visit: Payer: Managed Care, Other (non HMO) | Admitting: Internal Medicine

## 2017-12-08 ENCOUNTER — Ambulatory Visit (INDEPENDENT_AMBULATORY_CARE_PROVIDER_SITE_OTHER)
Admission: RE | Admit: 2017-12-08 | Discharge: 2017-12-08 | Disposition: A | Discharge status: Home / Self Care | Payer: Managed Care, Other (non HMO) | Source: Ambulatory Visit | Attending: Internal Medicine | Admitting: Internal Medicine

## 2017-12-08 ENCOUNTER — Other Ambulatory Visit (INDEPENDENT_AMBULATORY_CARE_PROVIDER_SITE_OTHER): Payer: Managed Care, Other (non HMO)

## 2017-12-08 VITALS — BP 118/80 | HR 78 | Temp 98.0°F | Ht 66.0 in

## 2017-12-08 DIAGNOSIS — J45909 Unspecified asthma, uncomplicated: Secondary | ICD-10-CM

## 2017-12-08 DIAGNOSIS — T7840XD Allergy, unspecified, subsequent encounter: Secondary | ICD-10-CM

## 2017-12-08 DIAGNOSIS — R0602 Shortness of breath: Secondary | ICD-10-CM

## 2017-12-08 DIAGNOSIS — R490 Dysphonia: Secondary | ICD-10-CM | POA: Diagnosis not present

## 2017-12-08 DIAGNOSIS — J0110 Acute frontal sinusitis, unspecified: Secondary | ICD-10-CM

## 2017-12-08 DIAGNOSIS — T7800XD Anaphylactic reaction due to unspecified food, subsequent encounter: Secondary | ICD-10-CM

## 2017-12-08 LAB — COMPREHENSIVE METABOLIC PANEL
ALBUMIN: 4.1 g/dL (ref 3.5–5.2)
ALK PHOS: 71 U/L (ref 39–117)
ALT: 20 U/L (ref 0–35)
AST: 11 U/L (ref 0–37)
BILIRUBIN TOTAL: 0.3 mg/dL (ref 0.2–1.2)
BUN: 14 mg/dL (ref 6–23)
CHLORIDE: 102 meq/L (ref 96–112)
CO2: 28 mEq/L (ref 19–32)
Calcium: 9.8 mg/dL (ref 8.4–10.5)
Creatinine, Ser: 0.92 mg/dL (ref 0.40–1.20)
GFR: 82.75 mL/min (ref >60.00)
Glucose, Bld: 127 mg/dL — ABNORMAL HIGH (ref 70–99)
Potassium: 4.1 mEq/L (ref 3.5–5.1)
SODIUM: 139 meq/L (ref 135–145)
Total Protein: 7.4 g/dL (ref 6.0–8.3)

## 2017-12-08 LAB — D-DIMER, QUANTITATIVE: D-Dimer, Quant: 0.2 mcg/mL FEU (ref <0.50)

## 2017-12-08 LAB — CBC
HCT: 42.3 % (ref 36.0–46.0)
Hemoglobin: 14.5 g/dL (ref 12.0–15.0)
MCHC: 34.3 g/dL (ref 30.0–36.0)
MCV: 89 fl (ref 78.0–100.0)
Platelets: 214 10*3/uL (ref 150.0–400.0)
RBC: 4.75 Mil/uL (ref 3.87–5.11)
RDW: 13 % (ref 11.5–15.5)
WBC: 11.4 10*3/uL — AB (ref 4.0–10.5)

## 2017-12-08 LAB — POCT EXHALED NITRIC OXIDE: FeNO level (ppb): 14

## 2017-12-08 NOTE — Assessment & Plan Note (Signed)
FENO today is 14 so not likely to respond to more prednisone. We are checking CXR. She is currently on 3 more days prednisone and 1 more week augmentin. If pneumonia or changes on CXR we will need to change antibioitc. Also checking d-dimer as the SOB is out of proportion to FENO and need to rule out blood clot. No known risk factors other than immobilization with this recent illness.

## 2017-12-08 NOTE — Assessment & Plan Note (Signed)
She does not have any throat swelling on exam and FENO indicates lack of inflammation in the lungs. She will finish 3 days prednisone and stop. She is asked to return to her allergy specialist office for this reaction which is lasting for an extended period of time without exposure to any foods she is allergic too.

## 2017-12-08 NOTE — Patient Instructions (Signed)
We are checking the x-ray today of the lungs.   The test does not show inflammation in the lungs which makes drainage and allergies more likely causing some of the breathing problems.   We would like you to call and schedule in with the allergy specialist to help us find out why you are not getting better from this allergic reaction.

## 2017-12-08 NOTE — Assessment & Plan Note (Signed)
Resolving and needs to finish augmentin. Depending on CXR results may need change of antibiotic.

## 2017-12-08 NOTE — Progress Notes (Signed)
Subjective:    Patient ID: Megan Wilkerson, female    DOB: 1965/08/21, 52 y.o.   MRN: [id number]  HPI The patient is a 52 YO female coming in for continued wheezing, SOB, fatigue. She was treated for allergic reaction against fish odor with epi injection and steroids. She is now almost 2 weeks of prednisone in and does not feel that this is improving. She is still itching a lot and having SOB and wheezing. She does not have lip or throat swelling. Able to swallow. Having stomach pain and not eating much lately. She is having a lot of drainage and allergy symptoms. No sinus pressure or pain. She denies fevers or chills. No re-exposure to anything. She is concerned as she is not improving and this has never happened before. She usually is better in 3-4 days. She is still very hoarse although this can take longer to improve.   Review of Systems  Constitutional: Positive for activity change, appetite change and fatigue. Negative for chills, fever and unexpected weight change.  HENT: Positive for congestion, postnasal drip and rhinorrhea. Negative for ear discharge, ear pain, sinus pressure, sinus pain, sneezing, sore throat, tinnitus, trouble swallowing and voice change.   Eyes: Negative.   Respiratory: Positive for cough, shortness of breath and wheezing. Negative for chest tightness.   Cardiovascular: Negative.   Gastrointestinal: Positive for nausea.  Musculoskeletal: Positive for myalgias.  Skin:       itching  Neurological: Negative.       Objective:   Physical Exam  Constitutional: She is oriented to person, place, and time. She appears well-developed and well-nourished.  HENT:  Head: Normocephalic and atraumatic.  Oropharynx with redness and clear drainage, no swelling appreciated, nose with swollen turbinates.   Eyes: EOM are normal.  Neck: Normal range of motion.  Cardiovascular: Normal rate and regular rhythm.  Pulmonary/Chest: Effort normal and breath sounds normal. No  respiratory distress. She has no wheezes. She has no rales.  Poor airflow versus effort, not breathless during conversation, voice is hoarse  Abdominal: Soft. Bowel sounds are normal. She exhibits no distension. There is tenderness. There is no rebound.  Musculoskeletal: She exhibits no edema.  Neurological: She is alert and oriented to person, place, and time. Coordination normal.  Skin: Skin is warm and dry.   Vitals:   12/08/17 1014  BP: 118/80  Pulse: 78  Temp: 98 F (36.7 C)  TempSrc: Oral  SpO2: 98%  Height: 5\' 6"  (1.676 m)   FENO: 14    Assessment & Plan:

## 2017-12-08 NOTE — Assessment & Plan Note (Signed)
FENO 14 in the office making asthma exacerbation unlikely. She has ongoing allergic reaction which could be related to some breathlessness. I think we need to evaluate other etiology with CXR today. We also need CBC, CMP and d-dimer to evaluate for PE although she is low risk for PE. She is stating SOB with even ADLs like dressing or doing dishes at home.

## 2017-12-08 NOTE — Assessment & Plan Note (Signed)
She does not feel allergic reaction is clearing even s/p 2 weeks with prednisone. Asked her to continue singulair, allegra, flonase and return to her allergy specialist. FENO low today for lung symptoms. This is not clearly still allergic reaction causing SOB and we are evaluating other sources.

## 2017-12-08 NOTE — Assessment & Plan Note (Signed)
Likely still from drainage and GERD. She is now back on allergy medicines for 3 days which is not enough to clear her hoarseness. She will continue with flonase, singulair, allegra. She is asked to call her allergy provider for a visit.

## 2017-12-19 ENCOUNTER — Encounter: Payer: Self-pay | Admitting: Allergy and Immunology

## 2017-12-19 ENCOUNTER — Ambulatory Visit: Payer: Managed Care, Other (non HMO) | Admitting: Allergy and Immunology

## 2017-12-19 VITALS — BP 122/84 | HR 93 | Temp 98.3°F | Resp 16 | Ht 66.0 in | Wt 230.0 lb

## 2017-12-19 DIAGNOSIS — K219 Gastro-esophageal reflux disease without esophagitis: Secondary | ICD-10-CM

## 2017-12-19 DIAGNOSIS — J3089 Other allergic rhinitis: Secondary | ICD-10-CM

## 2017-12-19 DIAGNOSIS — Z91018 Allergy to other foods: Secondary | ICD-10-CM

## 2017-12-19 DIAGNOSIS — J383 Other diseases of vocal cords: Secondary | ICD-10-CM

## 2017-12-19 DIAGNOSIS — J4540 Moderate persistent asthma, uncomplicated: Secondary | ICD-10-CM

## 2017-12-19 MED ORDER — DEXLANSOPRAZOLE 60 MG PO CPDR: 60.0000 mg | DELAYED_RELEASE_CAPSULE | Freq: Every day | ORAL | 3 refills | Status: DC

## 2017-12-19 MED ORDER — RANITIDINE HCL 300 MG PO CAPS: 300.0000 mg | ORAL_CAPSULE | Freq: Every evening | ORAL | 5 refills | Status: DC

## 2017-12-19 MED ORDER — ALBUTEROL SULFATE HFA 108 (90 BASE) MCG/ACT IN AERS: 2.0000 | INHALATION_SPRAY | Freq: Four times a day (QID) | RESPIRATORY_TRACT | 2 refills | Status: DC | PRN

## 2017-12-19 NOTE — Patient Instructions (Addendum)
  1. Continue Allergen avoidance measures  2. Continue to Treat and prevent inflammation:   A. Symbicort 160 - 2 inhalations twice a day with spacer  B. Flonase 1 spray each nostril twice a day  C. montelukast 10 mg one tablet once a day  3. Restart treatment for reflux   A. Dexilant 60 mg one tablet in a.m.  B. ranitidine 300 mg one tablet in PM  4. Use Paper bag rebreathing technique for hyperventilation. DO NOT throat clear or strain voice  5. If needed:   A. EpiPen, Benadryl, M.D./ER for allergic reaction  B. Proventil HFA 2 puffs or nebulization every 4-6 hours   C. OTC antihistamine - Claritin/Zyrtec/Allegra  6. Return to clinic in December 2019 or earlier if problem  7. Obtain fall flu vaccine

## 2017-12-19 NOTE — Progress Notes (Signed)
Follow-up Note  Referring Provider: Myrlene Broker, * Primary Provider: Myrlene Broker, MD Date of Office Visit: 12/19/2017  Subjective:   Megan Wilkerson (DOB: 1965-06-13) is a 52 y.o. female [facility] in re-evaluation of the following:  HPI: [name] presents to this clinic in evaluation of allergic rhinitis and asthma and vocal cord dysfunction with a component of hyperventilation and a history of food allergy and LPR. I last saw her in this clinic 17 May 2016.  She has really done very well since I last saw her while consistently using Symbicort and montelukast and Flonase regarding her airway.  She was able to taper off all medications involving therapy for reflux as this was no longer an issue.  She did not require any systemic steroids or antibiotics since last being seen in this clinic.  However, on 27 November 2017 she had exposure to a very strong fish smell and this led to a unrelenting coughing spell associated with gagging and near vomiting and ever since that point in time she has been having problems with coughing and wheezing and initially some mucus production and may be some nasal congestion.  When she uses a short acting bronchodilator it does not help her at all.  She was apparently treated with an antibiotic by her primary care doctor and prior to that treatment she was evaluated in the emergency room and treated with intravenous and systemic steroids.  Allergies as of 12/19/2017      Reactions   Milk-related Compounds    Mouth itching, dizziness, coughing, wheezing and SOB   Other Anaphylaxis   Archie Balboa rancher gummies and paprika   Peanuts [peanut Oil] Anaphylaxis, Itching   Corn Dextrin Other (See Comments)   Plain corn ok, popcorn causes hives   Gluten Meal Other (See Comments)   cough   Shellfish Allergy Itching   Soy Allergy Other (See Comments)   coughing   Sunflower Oil Itching   Tomato  Itching   Wheat Bran    swelling   Morphine Itching, Rash      Medication List      albuterol (2.5 MG/3ML) 0.083% nebulizer solution Commonly known as:  PROVENTIL Take 3 mLs (2.5 mg total) by nebulization every 6 (six) hours as needed for wheezing or shortness of breath.   albuterol 108 (90 Base) MCG/ACT inhaler Commonly known as:  PROVENTIL HFA;VENTOLIN HFA Inhale 2 puffs into the lungs every 6 (six) hours as needed for wheezing or shortness of breath.   B-6 PO Take 1 tablet by mouth daily at 12 noon.   brimonidine 0.15 % ophthalmic solution Commonly known as:  ALPHAGAN Place 1 drop into both eyes 2 (two) times daily.   budesonide-formoterol 160-4.5 MCG/ACT inhaler Commonly known as:  SYMBICORT Inhale two puffs twice daily to prevent cough or wheeze. Use with spacer.  Rinse, gargle, and spit after use.   Compressor/Nebulizer Misc As directed   dexlansoprazole 60 MG capsule Commonly known as:  DEXILANT Take 1 capsule (60 mg total) by mouth daily.   EPINEPHrine 0.3 mg/0.3 mL Soaj injection Commonly known as:  EPI-PEN Inject 0.3 mLs (0.3 mg total) into the muscle once for 1 dose.   fexofenadine 180 MG tablet Commonly known as:  ALLEGRA Take 180 mg by mouth daily.   fluticasone 50 MCG/ACT nasal spray Commonly known as:  FLONASE Place 2 sprays into both nostrils daily.   hydrOXYzine 25 MG tablet Commonly known as:  ATARAX/VISTARIL Take 1 tablet (25 mg total) by mouth 3 (three) times daily as needed for itching.   latanoprost 0.005 % ophthalmic solution Commonly known as:  XALATAN Place 1 drop into both eyes at bedtime.   montelukast 10 MG tablet Commonly known as:  SINGULAIR Take 1 tablet (10 mg total) by mouth daily.   ranitidine 300 MG capsule Commonly known as:  ZANTAC Take 1 capsule (300 mg total) by mouth every evening.       Past Medical History:  Diagnosis Date  . Anaphylactic reaction    several ED visits for same since 01/2012  . Asthma   .  GERD   . Hives of unknown origin    chronic and recurrent urticaria since 01/2012  . HYPERLIPIDEMIA, WITH LOW HDL   . MIGRAINE HEADACHE   . PERIPHERAL NEUROPATHY   . POSITIVE PPD 1987   s/p 38mo tx    Past Surgical History:  Procedure Laterality Date  . ABDOMINAL HYSTERECTOMY  1996  . APPENDECTOMY  1992  . CESAREAN SECTION  1989  . CHOLECYSTECTOMY  04/22/2011   Procedure: LAPAROSCOPIC CHOLECYSTECTOMY WITH INTRAOPERATIVE CHOLANGIOGRAM;  Surgeon: Almond Lint, MD;  Location: [facility] OR;  Service: General;  Laterality: N/A;  . CYST REMOVAL HAND Right 1997    Review of systems negative except as noted in HPI / PMHx or noted below:  Review of Systems  Constitutional: Negative.   HENT: Negative.   Eyes: Negative.   Respiratory: Negative.   Cardiovascular: Negative.   Gastrointestinal: Negative.   Genitourinary: Negative.   Musculoskeletal: Negative.   Skin: Negative.   Neurological: Negative.   Endo/Heme/Allergies: Negative.   Psychiatric/Behavioral: Negative.      Objective:   Vitals:   12/19/17 1651  BP: 122/84  Pulse: 93  Resp: 16  Temp: 98.3 F (36.8 C)  SpO2: 97%   Height: 5\' 6"  (167.6 cm)  Weight: 230 lb (104.3 kg)   Physical Exam  Constitutional:  Very strained high-frequency squeaky voice with throat clearing and slight cough  HENT:  Head: Normocephalic.  Right Ear: Tympanic membrane, external ear and ear canal normal.  Left Ear: Tympanic membrane, external ear and ear canal normal.  Nose: Nose normal. No mucosal edema or rhinorrhea.  Mouth/Throat: Uvula is midline, oropharynx is clear and moist and mucous membranes are normal. No oropharyngeal exudate.  Eyes: Conjunctivae are normal.  Neck: Trachea normal. No tracheal tenderness present. No tracheal deviation present. No thyromegaly present.  Cardiovascular: Normal rate, regular rhythm, S1 normal, S2 normal and normal heart sounds.  No murmur heard. Pulmonary/Chest: No stridor. No respiratory distress. She  has wheezes (Transmitted laryngeal wheezing). She has no rales.  Musculoskeletal: She exhibits no edema.  Lymphadenopathy:       Head (right side): No tonsillar adenopathy present.       Head (left side): No tonsillar adenopathy present.    She has no cervical adenopathy.  Neurological: She is alert.  Skin: No rash noted. She is not diaphoretic. No erythema. Nails show no clubbing.    Diagnostics:   The patient had an Asthma Control Test with the following results: ACT Total Score: 9.    Results of the chest x-ray obtained 08 December 2017 identified the following:  The heart size and mediastinal contours are within normal limits. Both lungs are clear. The visualized skeletal structures are unremarkable.  Assessment and Plan:   1. Vocal cord dysfunction   2. Asthma, moderate persistent, well-controlled   3. Other allergic rhinitis  4. LPRD (laryngopharyngeal reflux disease)   5. Food allergy     1. Continue Allergen avoidance measures  2. Continue to Treat and prevent inflammation:   A. Symbicort 160 - 2 inhalations twice a day with spacer  B. Flonase 1 spray each nostril twice a day  C. montelukast 10 mg one tablet once a day  3. Restart treatment for reflux   A. Dexilant 60 mg one tablet in a.m.  B. ranitidine 300 mg one tablet in PM  4. Use Paper bag rebreathing technique for hyperventilation. DO NOT throat clear or strain voice  5. If needed:   A. EpiPen, Benadryl, M.D./ER for allergic reaction  B. Proventil HFA 2 puffs or nebulization every 4-6 hours   C. OTC antihistamine - Claritin/Zyrtec/Allegra  6. Return to clinic in December 2019 or earlier if problem  7. Obtain fall flu vaccine  Karra has classic vocal cord presentation and we need to have her stop straining her laryngeal structures and I had a talk with her today about techniques she can utilize to relax her throat.  There may be a reflux component driving some of this issue and will restart her on  therapy for reflux.  I do not think that a short acting bronchodilators are really going to help her very much as I doubt that this is secondary to asthma at this point in time.  It should be noted that she was very skeptical about this whole issue of vocal cord problems and she is firmly convinced that it is her asthma causing her a problem even though she does not respond to a short acting bronchodilator and her presentation is one of vocal cord dysfunction.  I have informed her that she can contact me by telephone in a week and if she still has problems we will send her up to [facility] for further evaluation of her issue.  Assuming she does well I will see her back in this clinic in December 2019 or earlier if there is a problem.  Laurette Schimke, MD Allergy / Immunology [facility]

## 2017-12-20 ENCOUNTER — Encounter: Payer: Self-pay | Admitting: Allergy and Immunology

## 2017-12-20 ENCOUNTER — Ambulatory Visit: Payer: Managed Care, Other (non HMO) | Admitting: Allergy and Immunology

## 2017-12-20 ENCOUNTER — Telehealth: Payer: Self-pay | Admitting: Allergy and Immunology

## 2017-12-20 NOTE — Telephone Encounter (Signed)
What are insurance covered options?

## 2017-12-20 NOTE — Telephone Encounter (Signed)
Pt called and said that the ins will not cover the Dexilant and needs something else. [facility] on [address] [phone].

## 2017-12-20 NOTE — Telephone Encounter (Signed)
Patient stated that insurance will not cover Dexilant and she can not afford it. Is there anything else she can take instead. Please advise.

## 2017-12-21 ENCOUNTER — Other Ambulatory Visit: Payer: Self-pay | Admitting: *Deleted

## 2017-12-21 MED ORDER — PANTOPRAZOLE SODIUM 40 MG PO TBEC: 40.0000 mg | DELAYED_RELEASE_TABLET | Freq: Every day | ORAL | 5 refills | Status: DC

## 2017-12-21 NOTE — Telephone Encounter (Signed)
The medications that need to be tried first are Lansoprazole cpdr, Omeprazole cpdr, or Pantoprazole Sodium tbec.

## 2017-12-21 NOTE — Telephone Encounter (Signed)
Tried call pharmacy this morning to see what the alternatives are however pharmacy is not open now. Will try again later.

## 2017-12-21 NOTE — Telephone Encounter (Signed)
Protonix 40 mg 1 time per day

## 2017-12-21 NOTE — Telephone Encounter (Signed)
Prescription has been sent in. I have called and spoke with the patient and informed her of the change in medication.

## 2018-04-22 ENCOUNTER — Inpatient Hospital Stay (HOSPITAL_COMMUNITY)
Admission: EM | Admit: 2018-04-22 | Discharge: 2018-04-26 | DRG: 880 | Disposition: A | Discharge status: Home / Self Care | Payer: Managed Care, Other (non HMO) | Attending: Internal Medicine | Admitting: Internal Medicine

## 2018-04-22 ENCOUNTER — Other Ambulatory Visit: Payer: Self-pay

## 2018-04-22 ENCOUNTER — Encounter (HOSPITAL_COMMUNITY): Payer: Self-pay | Admitting: Emergency Medicine

## 2018-04-22 ENCOUNTER — Emergency Department (HOSPITAL_COMMUNITY): Payer: Managed Care, Other (non HMO)

## 2018-04-22 DIAGNOSIS — Z833 Family history of diabetes mellitus: Secondary | ICD-10-CM

## 2018-04-22 DIAGNOSIS — Z91013 Allergy to seafood: Secondary | ICD-10-CM

## 2018-04-22 DIAGNOSIS — E785 Hyperlipidemia, unspecified: Secondary | ICD-10-CM | POA: Diagnosis not present

## 2018-04-22 DIAGNOSIS — K219 Gastro-esophageal reflux disease without esophagitis: Secondary | ICD-10-CM | POA: Diagnosis not present

## 2018-04-22 DIAGNOSIS — R739 Hyperglycemia, unspecified: Secondary | ICD-10-CM | POA: Diagnosis not present

## 2018-04-22 DIAGNOSIS — Z91010 Allergy to peanuts: Secondary | ICD-10-CM

## 2018-04-22 DIAGNOSIS — Z91018 Allergy to other foods: Secondary | ICD-10-CM

## 2018-04-22 DIAGNOSIS — Z90710 Acquired absence of both cervix and uterus: Secondary | ICD-10-CM | POA: Diagnosis not present

## 2018-04-22 DIAGNOSIS — F445 Conversion disorder with seizures or convulsions: Secondary | ICD-10-CM | POA: Diagnosis present

## 2018-04-22 DIAGNOSIS — Z885 Allergy status to narcotic agent status: Secondary | ICD-10-CM

## 2018-04-22 DIAGNOSIS — G629 Polyneuropathy, unspecified: Secondary | ICD-10-CM | POA: Diagnosis present

## 2018-04-22 DIAGNOSIS — Z9049 Acquired absence of other specified parts of digestive tract: Secondary | ICD-10-CM | POA: Diagnosis not present

## 2018-04-22 DIAGNOSIS — J309 Allergic rhinitis, unspecified: Secondary | ICD-10-CM | POA: Diagnosis not present

## 2018-04-22 DIAGNOSIS — Z91011 Allergy to milk products: Secondary | ICD-10-CM

## 2018-04-22 DIAGNOSIS — G4733 Obstructive sleep apnea (adult) (pediatric): Secondary | ICD-10-CM | POA: Diagnosis not present

## 2018-04-22 DIAGNOSIS — R569 Unspecified convulsions: Secondary | ICD-10-CM

## 2018-04-22 DIAGNOSIS — Z888 Allergy status to other drugs, medicaments and biological substances status: Secondary | ICD-10-CM | POA: Diagnosis not present

## 2018-04-22 DIAGNOSIS — Z8249 Family history of ischemic heart disease and other diseases of the circulatory system: Secondary | ICD-10-CM | POA: Diagnosis not present

## 2018-04-22 DIAGNOSIS — Z79899 Other long term (current) drug therapy: Secondary | ICD-10-CM

## 2018-04-22 DIAGNOSIS — Z7951 Long term (current) use of inhaled steroids: Secondary | ICD-10-CM | POA: Diagnosis not present

## 2018-04-22 DIAGNOSIS — E876 Hypokalemia: Secondary | ICD-10-CM | POA: Diagnosis present

## 2018-04-22 DIAGNOSIS — J45909 Unspecified asthma, uncomplicated: Secondary | ICD-10-CM | POA: Diagnosis present

## 2018-04-22 HISTORY — DX: Unspecified convulsions: R56.9

## 2018-04-22 LAB — COMPREHENSIVE METABOLIC PANEL
ALBUMIN: 4.2 g/dL (ref 3.5–5.0)
ALK PHOS: 67 U/L (ref 38–126)
ALT: 13 U/L (ref 0–44)
AST: 18 U/L (ref 15–41)
Anion gap: 9 (ref 5–15)
BUN: 15 mg/dL (ref 6–20)
CALCIUM: 9.2 mg/dL (ref 8.9–10.3)
CO2: 26 mmol/L (ref 22–32)
Chloride: 108 mmol/L (ref 98–111)
Creatinine, Ser: 1.05 mg/dL — ABNORMAL HIGH (ref 0.44–1.00)
GFR calc Af Amer: >60 mL/min (ref >60)
GFR calc non Af Amer: >60 mL/min (ref >60)
GLUCOSE: 133 mg/dL — AB (ref 70–99)
Potassium: 3.3 mmol/L — ABNORMAL LOW (ref 3.5–5.1)
SODIUM: 143 mmol/L (ref 135–145)
Total Bilirubin: 0.4 mg/dL (ref 0.3–1.2)
Total Protein: 7.6 g/dL (ref 6.5–8.1)

## 2018-04-22 LAB — CBC WITH DIFFERENTIAL/PLATELET
ABS IMMATURE GRANULOCYTES: 0.04 10*3/uL (ref 0.00–0.07)
BASOS ABS: 0.1 10*3/uL (ref 0.0–0.1)
Basophils Relative: 1 %
Eosinophils Absolute: 0.1 10*3/uL (ref 0.0–0.5)
Eosinophils Relative: 2 %
HCT: 43.6 % (ref 36.0–46.0)
HEMOGLOBIN: 14.4 g/dL (ref 12.0–15.0)
IMMATURE GRANULOCYTES: 1 %
LYMPHS ABS: 2 10*3/uL (ref 0.7–4.0)
Lymphocytes Relative: 29 %
MCH: 29.4 pg (ref 26.0–34.0)
MCHC: 33 g/dL (ref 30.0–36.0)
MCV: 89.2 fL (ref 80.0–100.0)
Monocytes Absolute: 0.4 10*3/uL (ref 0.1–1.0)
Monocytes Relative: 6 %
NRBC: 0 % (ref 0.0–0.2)
Neutro Abs: 4.2 10*3/uL (ref 1.7–7.7)
Neutrophils Relative %: 61 %
Platelets: 218 10*3/uL (ref 150–400)
RBC: 4.89 MIL/uL (ref 3.87–5.11)
RDW: 12.3 % (ref 11.5–15.5)
WBC: 6.8 10*3/uL (ref 4.0–10.5)

## 2018-04-22 LAB — MAGNESIUM: Magnesium: 2.1 mg/dL (ref 1.7–2.4)

## 2018-04-22 LAB — CBG MONITORING, ED: GLUCOSE-CAPILLARY: 91 mg/dL (ref 70–99)

## 2018-04-22 LAB — ETHANOL: Alcohol, Ethyl (B): <10 mg/dL (ref <10)

## 2018-04-22 MED ORDER — ONDANSETRON HCL 4 MG/2ML IJ SOLN: 4.0000 mg | Freq: Four times a day (QID) | INTRAMUSCULAR | Status: DC | PRN

## 2018-04-22 MED ORDER — FAMOTIDINE 20 MG PO TABS
40.0000 mg | ORAL_TABLET | Freq: Every day | ORAL | Status: DC
  Administered 2018-04-23 – 2018-04-25 (×3): 40 mg via ORAL
  Filled 2018-04-22 (×3): qty 2

## 2018-04-22 MED ORDER — SODIUM CHLORIDE 0.9% FLUSH
3.0000 mL | Freq: Two times a day (BID) | INTRAVENOUS | Status: DC
  Administered 2018-04-23 – 2018-04-25 (×5): 3 mL via INTRAVENOUS

## 2018-04-22 MED ORDER — POTASSIUM CHLORIDE 10 MEQ/100ML IV SOLN
10.0000 meq | INTRAVENOUS | Status: AC
  Administered 2018-04-23: 10 meq via INTRAVENOUS
  Filled 2018-04-22: qty 100

## 2018-04-22 MED ORDER — LORAZEPAM 2 MG/ML IJ SOLN
INTRAMUSCULAR | Status: AC
  Administered 2018-04-22: 2 mg
  Filled 2018-04-22: qty 3

## 2018-04-22 MED ORDER — LATANOPROST 0.005 % OP SOLN
1.0000 [drp] | Freq: Every day | OPHTHALMIC | Status: DC
  Administered 2018-04-23 – 2018-04-25 (×4): 1 [drp] via OPHTHALMIC
  Filled 2018-04-22: qty 2.5

## 2018-04-22 MED ORDER — LEVETIRACETAM IN NACL 500 MG/100ML IV SOLN
500.0000 mg | Freq: Two times a day (BID) | INTRAVENOUS | Status: DC
  Administered 2018-04-23: 500 mg via INTRAVENOUS
  Filled 2018-04-22: qty 100

## 2018-04-22 MED ORDER — LORAZEPAM 2 MG/ML IJ SOLN
INTRAMUSCULAR | Status: AC
  Administered 2018-04-22: 2 mg
  Filled 2018-04-22: qty 1

## 2018-04-22 MED ORDER — PANTOPRAZOLE SODIUM 40 MG PO TBEC
40.0000 mg | DELAYED_RELEASE_TABLET | Freq: Every day | ORAL | Status: DC
  Administered 2018-04-23 – 2018-04-26 (×4): 40 mg via ORAL
  Filled 2018-04-22 (×4): qty 1

## 2018-04-22 MED ORDER — ACETAMINOPHEN 325 MG PO TABS
650.0000 mg | ORAL_TABLET | Freq: Four times a day (QID) | ORAL | Status: DC | PRN
  Administered 2018-04-23 – 2018-04-25 (×3): 650 mg via ORAL
  Filled 2018-04-22 (×4): qty 2

## 2018-04-22 MED ORDER — ACETAMINOPHEN 650 MG RE SUPP: 650.0000 mg | Freq: Four times a day (QID) | RECTAL | Status: DC | PRN

## 2018-04-22 MED ORDER — IOHEXOL 300 MG/ML  SOLN
75.0000 mL | Freq: Once | INTRAMUSCULAR | Status: AC | PRN
  Administered 2018-04-22: 75 mL via INTRAVENOUS

## 2018-04-22 MED ORDER — ENOXAPARIN SODIUM 40 MG/0.4ML ~~LOC~~ SOLN
40.0000 mg | SUBCUTANEOUS | Status: DC
  Administered 2018-04-23 – 2018-04-25 (×4): 40 mg via SUBCUTANEOUS
  Filled 2018-04-22 (×4): qty 0.4

## 2018-04-22 MED ORDER — MONTELUKAST SODIUM 10 MG PO TABS
10.0000 mg | ORAL_TABLET | Freq: Every day | ORAL | Status: DC
  Administered 2018-04-24 – 2018-04-25 (×3): 10 mg via ORAL
  Filled 2018-04-22 (×3): qty 1

## 2018-04-22 MED ORDER — LEVETIRACETAM IN NACL 1000 MG/100ML IV SOLN
1000.0000 mg | Freq: Once | INTRAVENOUS | Status: AC
  Administered 2018-04-22: 1000 mg via INTRAVENOUS
  Filled 2018-04-22: qty 100

## 2018-04-22 MED ORDER — SODIUM CHLORIDE 0.9 % IV SOLN
INTRAVENOUS | Status: AC
  Administered 2018-04-23: via INTRAVENOUS

## 2018-04-22 MED ORDER — LORAZEPAM 2 MG/ML IJ SOLN
2.0000 mg | Freq: Once | INTRAMUSCULAR | Status: AC
  Administered 2018-04-22: 2 mg via INTRAVENOUS

## 2018-04-22 MED ORDER — ALBUTEROL SULFATE (2.5 MG/3ML) 0.083% IN NEBU: 2.5000 mg | INHALATION_SOLUTION | Freq: Four times a day (QID) | RESPIRATORY_TRACT | Status: DC | PRN

## 2018-04-22 MED ORDER — ONDANSETRON HCL 4 MG PO TABS: 4.0000 mg | ORAL_TABLET | Freq: Four times a day (QID) | ORAL | Status: DC | PRN

## 2018-04-22 MED ORDER — MOMETASONE FURO-FORMOTEROL FUM 200-5 MCG/ACT IN AERO
2.0000 | INHALATION_SPRAY | Freq: Two times a day (BID) | RESPIRATORY_TRACT | Status: DC
  Administered 2018-04-23 – 2018-04-26 (×7): 2 via RESPIRATORY_TRACT
  Filled 2018-04-22 (×2): qty 8.8

## 2018-04-22 MED ORDER — BRIMONIDINE TARTRATE 0.15 % OP SOLN
1.0000 [drp] | Freq: Two times a day (BID) | OPHTHALMIC | Status: DC
  Administered 2018-04-23 – 2018-04-26 (×8): 1 [drp] via OPHTHALMIC
  Filled 2018-04-22: qty 5

## 2018-04-22 MED ORDER — SODIUM CHLORIDE (PF) 0.9 % IJ SOLN
INTRAMUSCULAR | Status: AC
  Filled 2018-04-22: qty 50

## 2018-04-22 NOTE — ED Notes (Signed)
Patient had witnessed seizure that lasted about 2 minutes.

## 2018-04-22 NOTE — ED Notes (Signed)
Patient appears to present with post-ictal symptoms. Patient is sleeping but can be aroused by voice or touch.

## 2018-04-22 NOTE — ED Triage Notes (Signed)
Patient [place] by EMS from church. Pt had witnessed multiple seizures that lasted 2 minutes. Pt bystander administered Epi pen.   Pt is having post-ictal reaction to seizure. Pt aware of self but unable to answer questions for EMS. EMS unaware if pt takes their medication for seizures.   BP 140/90, HR 96, SpO2 99% on RA, RR 16, and CBG 155, w/ NSR.

## 2018-04-22 NOTE — ED Notes (Signed)
Patient appears to be resting at this time.

## 2018-04-22 NOTE — Consult Note (Addendum)
Neurology Consultation  Reason for Consult: Multiple seizures-new onset Referring Physician: Dr. Rush Landmark  CC: Seizures  History is obtained from: Chart, family  HPI: Megan Wilkerson is a 52 y.o. female past medical history asthma and allergies along with anaphylactic reaction to peanuts, presented emergency room from church for evaluation of seizures. Her sister and husband were at the bedside to provide history.  She was charged with her sister and had 2 episodes of generalized seizure with loss of consciousness.  She reports the episodes lasted anywhere from 2 to 5 minutes and she did not come around to be normal between these 2 episodes.  In the emergency room, she was noted to have another 3 or 4 witnessed generalized shaking spells.  According to the ED provider, during the spells, the examination was inconsistent with patient responding to visual threat raising suspicion for a nonorganic etiology. Nonetheless, she remains postictal after the seizures.  She also received multiple doses of benzodiazepines. She is unable to provide any history. According to the family, she is never had seizures before.  She has a sister who has a brain cyst and has had seizures in the past.  That sister also has multiple allergies including severe anaphylactic reactions to some agents. 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 Family denied any preceding illnesses but said couple weeks ago she had sick contact with a grandchild who was sick with fever and chills and cough. Patient's not been complaining of headaches prior to presentation according to family.  Patient is unable to provide any reliable review of systems. Family denies illicit drug use or alcohol abuse.  LKW: 3 PM on 04/22/2018 tpa given?: no, nonfocal exam, seizures and presentation, low suspicion for stroke. Premorbid modified Rankin scale (mRS): 0  ROS:  Unable to obtain due to altered mental status.    Past Medical History:  Diagnosis Date  . Anaphylactic reaction    several ED visits for same since 01/2012  . Asthma   . GERD   . Hives of unknown origin    chronic and recurrent urticaria since 01/2012  . HYPERLIPIDEMIA, WITH LOW HDL   . MIGRAINE HEADACHE   . PERIPHERAL NEUROPATHY   . POSITIVE PPD 1987   s/p 45mo tx  . Seizures (HCC)    Family History  Problem Relation Age of Onset  . Prostate cancer Father   . Hypertension Father   . Heart disease Father   . Diabetes Mother   . Diabetes Brother   . Alcohol abuse Other   . Arthritis Other   . Hypertension Other   . Seizures Sister     Social History:   reports that she has never smoked. She has never used smokeless tobacco. She reports that she does not drink alcohol or use drugs.  Medications  Current Facility-Administered Medications:  .  0.9 %  sodium chloride infusion, , Intravenous, Continuous, Opyd, Lavone Neri, MD .  acetaminophen (TYLENOL) tablet 650 mg, 650 mg, Oral, Q6H PRN **OR** acetaminophen (TYLENOL) suppository 650 mg, 650 mg, Rectal, Q6H PRN, Opyd, Timothy S, MD .  albuterol (PROVENTIL) (2.5 MG/3ML) 0.083% nebulizer solution 2.5 mg, 2.5 mg, Nebulization, Q6H PRN, Opyd, Timothy S, MD .  brimonidine (ALPHAGAN) 0.15 % ophthalmic solution 1 drop, 1 drop, Both Eyes, BID, Opyd, Timothy S, MD .  enoxaparin (LOVENOX) injection 40 mg, 40 mg, Subcutaneous, Q24H, Opyd, Timothy S, MD .  famotidine (PEPCID) tablet 40 mg, 40 mg, Oral,  QHS, Opyd, Lavone Neri, MD .  latanoprost (XALATAN) 0.005 % ophthalmic solution 1 drop, 1 drop, Both Eyes, QHS, Opyd, Lavone Neri, MD .  Melene Muller ON 04/23/2018] levETIRAcetam (KEPPRA) IVPB 500 mg/100 mL premix, 500 mg, Intravenous, Q12H, Opyd, Lavone Neri, MD .  Melene Muller ON 04/23/2018] mometasone-formoterol (DULERA) 200-5 MCG/ACT inhaler 2 puff, 2 puff, Inhalation, BID, Opyd, Lavone Neri, MD .  Melene Muller ON 04/23/2018] montelukast (SINGULAIR) tablet 10 mg, 10 mg, Oral, QHS, Opyd, Timothy S, MD .   ondansetron (ZOFRAN) tablet 4 mg, 4 mg, Oral, Q6H PRN **OR** ondansetron (ZOFRAN) injection 4 mg, 4 mg, Intravenous, Q6H PRN, Opyd, Lavone Neri, MD .  Melene Muller ON 04/23/2018] pantoprazole (PROTONIX) EC tablet 40 mg, 40 mg, Oral, Daily, Opyd, Timothy S, MD .  potassium chloride 10 mEq in 100 mL IVPB, 10 mEq, Intravenous, Q1 Hr x 2, Opyd, Timothy S, MD .  sodium chloride (PF) 0.9 % injection, , , ,  .  sodium chloride flush (NS) 0.9 % injection 3 mL, 3 mL, Intravenous, Q12H, Opyd, Lavone Neri, MD  Current Outpatient Medications:  .  albuterol (PROVENTIL HFA;VENTOLIN HFA) 108 (90 Base) MCG/ACT inhaler, Inhale 2 puffs into the lungs every 6 (six) hours as needed for wheezing or shortness of breath., Disp: 1 Inhaler, Rfl: 2 .  albuterol (PROVENTIL) (2.5 MG/3ML) 0.083% nebulizer solution, Take 3 mLs (2.5 mg total) by nebulization every 6 (six) hours as needed for wheezing or shortness of breath., Disp: 75 mL, Rfl: 12 .  brimonidine (ALPHAGAN) 0.15 % ophthalmic solution, Place 1 drop into both eyes 2 (two) times daily., Disp: , Rfl:  .  budesonide-formoterol (SYMBICORT) 160-4.5 MCG/ACT inhaler, Inhale two puffs twice daily to prevent cough or wheeze. Use with spacer.  Rinse, gargle, and spit after use., Disp: 1 Inhaler, Rfl: 5 .  dexlansoprazole (DEXILANT) 60 MG capsule, Take 1 capsule (60 mg total) by mouth daily., Disp: 30 capsule, Rfl: 3 .  EPINEPHrine 0.3 mg/0.3 mL IJ SOAJ injection, Inject 0.3 mLs (0.3 mg total) into the muscle once for 1 dose., Disp: 1 Device, Rfl: 2 .  fexofenadine (ALLEGRA) 180 MG tablet, Take 180 mg by mouth daily., Disp: , Rfl:  .  fluticasone (FLONASE) 50 MCG/ACT nasal spray, Place 2 sprays into both nostrils daily., Disp: 16 g, Rfl: 5 .  hydrOXYzine (ATARAX/VISTARIL) 25 MG tablet, Take 1 tablet (25 mg total) by mouth 3 (three) times daily as needed for itching., Disp: 30 tablet, Rfl: 1 .  latanoprost (XALATAN) 0.005 % ophthalmic solution, Place 1 drop into both eyes at bedtime. ,  Disp: , Rfl:  .  montelukast (SINGULAIR) 10 MG tablet, Take 1 tablet (10 mg total) by mouth daily., Disp: 90 tablet, Rfl: 3 .  Nebulizers (COMPRESSOR/NEBULIZER) MISC, As directed, Disp: 1 each, Rfl: 0 .  pantoprazole (PROTONIX) 40 MG tablet, Take 1 tablet (40 mg total) by mouth daily., Disp: 30 tablet, Rfl: 5 .  Pyridoxine HCl (B-6 PO), Take 1 tablet by mouth daily at 12 noon., Disp: , Rfl:  .  ranitidine (ZANTAC) 300 MG capsule, Take 1 capsule (300 mg total) by mouth every evening., Disp: 30 capsule, Rfl: 5  Exam: Current vital signs: BP (!) 119/103 (BP Location: Right Arm)   Pulse 79   Temp 98.3 F (36.8 C) (Oral)   Resp 16   SpO2 90%  Vital signs in last 24 hours: Temp:  [98.3 F (36.8 C)] 98.3 F (36.8 C) (11/24 1604) Pulse Rate:  [74-91] 79 (11/24 2030) Resp:  [9-22] 16 (  11/24 2030) BP: (119-159)/(85-121) 119/103 (11/24 2030) SpO2:  [90 %-100 %] 90 % (11/24 2030)  GENERAL: Drowsy breathing normally, no distress HEENT: - Normocephalic and atraumatic, dry mm, no LN++, no Thyromegally.  Neck is supple LUNGS - Clear to auscultation bilaterally with no wheezes CV - S1S2 RRR, no m/r/g, equal pulses bilaterally. ABDOMEN - Soft, nontender, nondistended with normoactive BS Ext: warm, well perfused, intact peripheral pulses, no edema.  No Kernig's or Brudzinski's.  NEURO:  Mental Status: Drowsy, opens eyes to loud voice and noxious stim elation.  Follows simple commands consistently.  Unable to follow multistep commands.  Poor attention concentration. Language: speech is hypophonic but not dysarthric.  Very poor attention concentration-keeps falling asleep during the entire encounter.  Unable to assess naming, repetition, comprehension consistently. Cranial Nerves: PERRL. EOMI with no nystagmus or abnormal eye movements, visual fields full to threat, no facial asymmetry,facial sensation intact, hearing intact, tongue/uvula/soft palate midline, normal sternocleidomastoid and trapezius  muscle strength. No evidence of tongue atrophy or fibrillations Motor: 5/5 in all 4 but keeps dropping to the bed due to inattention. Tone: is normal and bulk is normal Sensation- Intact to light touch bilaterally Coordination: Unable to perform finger-nose-finger or heel-knee-shin Gait- deferred  Labs I have reviewed labs in epic and the results pertinent to this consultation are:  CBC    Component Value Date/Time   WBC 6.8 04/22/2018 1621   RBC 4.89 04/22/2018 1621   HGB 14.4 04/22/2018 1621   HCT 43.6 04/22/2018 1621   PLT 218 04/22/2018 1621   MCV 89.2 04/22/2018 1621   MCH 29.4 04/22/2018 1621   MCHC 33.0 04/22/2018 1621   RDW 12.3 04/22/2018 1621   LYMPHSABS 2.0 04/22/2018 1621   MONOABS 0.4 04/22/2018 1621   EOSABS 0.1 04/22/2018 1621   BASOSABS 0.1 04/22/2018 1621   CMP-hypokalemia, hyperglycemia, borderline elevated creatinine     Component Value Date/Time   NA 143 04/22/2018 1621   K 3.3 (L) 04/22/2018 1621   CL 108 04/22/2018 1621   CO2 26 04/22/2018 1621   GLUCOSE 133 (H) 04/22/2018 1621   BUN 15 04/22/2018 1621   CREATININE 1.05 (H) 04/22/2018 1621   CALCIUM 9.2 04/22/2018 1621   PROT 7.6 04/22/2018 1621   ALBUMIN 4.2 04/22/2018 1621   AST 18 04/22/2018 1621   ALT 13 04/22/2018 1621   ALKPHOS 67 04/22/2018 1621   BILITOT 0.4 04/22/2018 1621   GFRNONAA >60 04/22/2018 1621   GFRAA >60 04/22/2018 1621   Imaging I have reviewed the images obtained:  CT-scan of the brain-no acute changes.  Empty sella.  Assessment:  52 year old woman with past medical history of allergies and anaphylaxis with severe peanut allergy, presenting for evaluation of multiple seizures that happened in church as well as in the emergency room with some question on the semiology and concern for possible nonorganic etiology.  Nonetheless, she remains postictal after the seizures-caveat is that she did receive a lot of benzodiazepines to. Needs seizure evaluation.  Needs  antiepileptics.  Impression: Evaluate for new onset seizure.  Etiology to be determined Concern for nonorganic etiology- continue work-up.   Recommendations: Loaded with Keppra-agree with the load Continue Keppra 500 twice daily Maintain seizure precautions Routine EEG in the morning Neuro monitoring-frequent neurochecks and telemetry. MRI brain with and without contrast when able to. If no abnormality is discovered during work-up, she might need a long-term EEG admission and epilepsy monitoring unit.  Use benzodiazepine for seizure lasting greater than 5 minutes.  Do not  use benzos unless the seizure activity lasts more than 5 minutes. Also call neurology at the time.  Neurology will continue to follow with you.  Plan was relayed to the ED provider and admitting hospitalist in the [facility] long emergency room.  Patient will be transferred to [facility] for imaging and EEG as well as further neuro monitoring.  No driving for 6 months per [facility] law as detailed below.   Per [facility] statutes, patients with seizures are not allowed to drive until they have been seizure-free for six months.   Use caution when using heavy equipment or power tools. Avoid working on ladders or at heights. Take showers instead of baths. Ensure the water temperature is not too high on the home water heater. Do not go swimming alone. Do not lock yourself in a room alone (i.e. bathroom). When caring for infants or small children, sit down when holding, feeding, or changing them to minimize risk of injury to the child in the event you have a seizure. Maintain good sleep hygiene. Avoid alcohol.   If patient has another seizure, call 911 and bring them back to the ED if: A. The seizure lasts longer than 5 minutes.  B. The patient doesn't wake shortly after the seizure or has new problems such as difficulty seeing, speaking or moving following the seizure C. The patient was  injured during the seizure D. The patient has a temperature over 102 F (39C) E. The patient vomited during the seizure and now is having trouble breathing  -- Milon Dikes, MD [facility] Pager: [phone] If 7pm to 7am, please call on call as listed on AMION.

## 2018-04-22 NOTE — ED Notes (Signed)
Patient had witness seizure that lasted approximately 1 minute. Staff at bedside to witness seizure. 2 mg of Ativan given.

## 2018-04-22 NOTE — ED Notes (Signed)
Unable to obtain urine from patient due to mental status (post-ictal).

## 2018-04-22 NOTE — ED Provider Notes (Signed)
[facility]-EMERGENCY DEPT Provider Note   CSN: [id number] Arrival date & time: 04/22/18  1552     History   Chief Complaint Chief Complaint  Patient presents with  . Seizures    HPI Megan Wilkerson is a 52 y.o. female presents emergency department via EMS for seizure.  There is a level 5 caveat due to postictal state.  History is gathered from the patient's husband.  Patient has no previous history of seizures.  1 of her friends who is at bedside was with her at church today when she walked into the church room.  She began leaning against the side of the wall and looked like she was getting ready to fall and then fell to the ground and began having tonic-clonic activity.  The screws were unsure of what to do but did know that she had a history of anaphylaxis and gave her EpiPen.  Patient had another seizure on the way with EMS.  Patient also had 2 witnessed seizures in front of me and was given 2 mg of IV Ativan.  Her husband reports no alcohol use and no new medications.  And has had no reported falls, she has not been complaining of headache.  HPI  Past Medical History:  Diagnosis Date  . Anaphylactic reaction    several ED visits for same since 01/2012  . Asthma   . GERD   . Hives of unknown origin    chronic and recurrent urticaria since 01/2012  . HYPERLIPIDEMIA, WITH LOW HDL   . MIGRAINE HEADACHE   . PERIPHERAL NEUROPATHY   . POSITIVE PPD 1987   s/p 89mo tx  . Seizures [facility])     Patient Active Problem List   Diagnosis Date Noted  . SOB (shortness of breath) 12/08/2017  . Routine general medical examination at a health care facility 07/30/2015  . Sinusitis 08/29/2014  . Obese 07/23/2014  . Syncope 02/11/2013  . Hoarseness 10/25/2012  . Allergic rhinitis 10/25/2012  . Obstructive sleep apnea 10/25/2012  . 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 Allergic reaction 03/13/2012  . Asthma with bronchitis 01/12/2011  . PERIPHERAL NEUROPATHY 01/14/2010    . MAMMOGRAM, ABNORMAL, LEFT 04/22/2009  . HYPERLIPIDEMIA, WITH LOW HDL 09/03/2008  . ANEMIA-NOS 09/03/2008  . MIGRAINE HEADACHE 09/03/2008  . GERD 09/03/2008  . PPD positive 09/03/2008    Past Surgical History:  Procedure Laterality Date  . ABDOMINAL HYSTERECTOMY  1996  . APPENDECTOMY  1992  . CESAREAN SECTION  1989  . CHOLECYSTECTOMY  04/22/2011   Procedure: LAPAROSCOPIC CHOLECYSTECTOMY WITH INTRAOPERATIVE CHOLANGIOGRAM;  Surgeon: Almond Lint, MD;  Location: [facility] OR;  Service: General;  Laterality: N/A;  . CYST REMOVAL HAND Right 1997     OB History    Gravida  5   Para  5   Term      Preterm      AB      Living  5     SAB      TAB      Ectopic      Multiple      Live Births               Home Medications    Prior to Admission medications   Medication Sig Start Date End Date Taking? Authorizing Provider  albuterol (PROVENTIL HFA;VENTOLIN HFA) 108 (90 Base) MCG/ACT inhaler Inhale 2 puffs into the lungs every 6 (six) hours as needed for wheezing or shortness of breath. 12/19/17   Kozlow,  Alvira Philips, MD  albuterol (PROVENTIL) (2.5 MG/3ML) 0.083% nebulizer solution Take 3 mLs (2.5 mg total) by nebulization every 6 (six) hours as needed for wheezing or shortness of breath. 02/11/15   Jetty Duhamel D, MD  brimonidine (ALPHAGAN) 0.15 % ophthalmic solution Place 1 drop into both eyes 2 (two) times daily.    [provider]  budesonide-formoterol (SYMBICORT) 160-4.5 MCG/ACT inhaler Inhale two puffs twice daily to prevent cough or wheeze. Use with spacer.  Rinse, gargle, and spit after use. 12/04/17   Myrlene Broker, MD  dexlansoprazole (DEXILANT) 60 MG capsule Take 1 capsule (60 mg total) by mouth daily. 12/19/17   Kozlow, Alvira Philips, MD  EPINEPHrine 0.3 mg/0.3 mL IJ SOAJ injection Inject 0.3 mLs (0.3 mg total) into the muscle once for 1 dose. 11/27/17 12/19/17  Benjiman Core, MD  fexofenadine (ALLEGRA) 180 MG tablet Take 180 mg by mouth daily.    [provider]  fluticasone (FLONASE) 50 MCG/ACT nasal spray Place 2 sprays into both nostrils daily. 12/04/17   Myrlene Broker, MD  hydrOXYzine (ATARAX/VISTARIL) 25 MG tablet Take 1 tablet (25 mg total) by mouth 3 (three) times daily as needed for itching. 02/15/16   [place], Betty G, MD  latanoprost (XALATAN) 0.005 % ophthalmic solution Place 1 drop into both eyes at bedtime.  10/31/17   [provider]  montelukast (SINGULAIR) 10 MG tablet Take 1 tablet (10 mg total) by mouth daily. 12/04/17   Myrlene Broker, MD  Nebulizers (COMPRESSOR/NEBULIZER) MISC As directed 02/11/15   Waymon Budge, MD  pantoprazole (PROTONIX) 40 MG tablet Take 1 tablet (40 mg total) by mouth daily. 12/21/17   Kozlow, Alvira Philips, MD  Pyridoxine HCl (B-6 PO) Take 1 tablet by mouth daily at 12 noon.    [provider]  ranitidine (ZANTAC) 300 MG capsule Take 1 capsule (300 mg total) by mouth every evening. 12/19/17   Kozlow, Alvira Philips, MD    Family History Family History  Problem Relation Age of Onset  . Prostate cancer Father   . Hypertension Father   . Heart disease Father   . Diabetes Mother   . Diabetes Brother   . Alcohol abuse Other   . Arthritis Other   . Hypertension Other     Social History Social History   Tobacco Use  . Smoking status: Never Smoker  . Smokeless tobacco: Never Used  Substance Use Topics  . Alcohol use: No  . Drug use: No     Allergies   Milk-related compounds; Other; Peanuts [peanut oil]; Corn dextrin; Gluten meal; Shellfish allergy; Soy allergy; Sunflower oil; Tomato; Wheat bran; and Morphine   Review of Systems Review of Systems  Unable to review systems due to postictal state. Physical Exam Updated Vital Signs BP (!) 145/106 (BP Location: Right Arm)   Pulse 91   Temp 98.3 F (36.8 C) (Oral)   Resp 19   SpO2 96%   Physical Exam  Constitutional: She appears well-developed and well-nourished. She appears lethargic.  HENT:  Head: Normocephalic  and atraumatic.  Eyes: Pupils are equal, round, and reactive to light. EOM are normal.  Pupils equal but sluggish  Neck: Normal range of motion. Neck supple.  Cardiovascular: Normal rate and regular rhythm.  Pulmonary/Chest: Effort normal and breath sounds normal.  Abdominal: Soft. Bowel sounds are normal. She exhibits no distension. There is no tenderness.  Musculoskeletal: Normal range of motion. She exhibits no edema or deformity.  Neurological: She appears lethargic. She  is not disoriented. No cranial nerve deficit. She displays seizure activity.  Skin: Skin is warm and dry. Capillary refill takes less than 2 seconds.  Nursing note and vitals reviewed.    ED Treatments / Results  Labs (all labs ordered are listed, but only abnormal results are displayed) Labs Reviewed  CBG MONITORING, ED    EKG None  Radiology No results found.  Procedures .Critical Care Performed by: Arthor CaptainHarris, Juliah Scadden, PA-C Authorized by: Arthor CaptainHarris, Tamala Manzer, PA-C   Critical care provider statement:    Critical care time (minutes):  45   Critical care was necessary to treat or prevent imminent or life-threatening deterioration of the following conditions:  CNS failure or compromise   Critical care was time spent personally by me on the following activities:  Discussions with consultants, evaluation of patient's response to treatment, examination of patient, ordering and performing treatments and interventions, ordering and review of laboratory studies, ordering and review of radiographic studies, pulse oximetry, re-evaluation of patient's condition, obtaining history from patient or surrogate and review of old charts   (including critical care time)  Medications Ordered in ED Medications - No data to display   Initial Impression / Assessment and Plan / ED Course  I have reviewed the triage vital signs and the nursing notes.  Pertinent labs & imaging results that were available during my care of the  patient were reviewed by me and considered in my medical decision making (see chart for details).     52 year old female who presents with new onset seizures.  Patient had a total of 4 witnessed seizures here in the emergency department.  Patient was given 4 mg of IV Ativan.  I personally observed her seizure-like activity.  She did not have a specific pattern but did have tonic-clonic activity, tongue thrusting.  No gaze preference or focal deficit.  Patient was post ictal after her seizures.  I spoke with Dr. Otelia LimesLindzen who recommended CT head with and without contrast.  He reviewed her medication list and felt she did not have any specific medications which might induce a seizure and the patient was also started on IV Keppra loading dose.   Patient's labs show mild hypokalemia.  Her blood counts magnesium ethanol are all within normal limits.  Patient CT head with and without contrast shows no abnormalities.  I spoken with Dr. Wilford CornerArora who asked that [facility].  Patient will be admitted by Dr. Antionette Charpyd.  Final Clinical Impressions(s) / ED Diagnoses   Final diagnoses:  None    ED Discharge Orders    None       Arthor CaptainHarris, Shamila Lerch, PA-C 04/22/18 2329    Tegeler, Canary Brimhristopher J, MD 04/22/18 2329

## 2018-04-22 NOTE — ED Notes (Signed)
Bed: WA25 Expected date:  Expected time:  Means of arrival:  Comments: [facility] for [employer]

## 2018-04-22 NOTE — H&P (Addendum)
History and Physical    Megan Wilkerson ZOX:[id number] DOB: 1965/12/04 DOA: 04/22/2018  PCP: Myrlene Broker, MD   Patient coming from: Home   Chief Complaint: Seizures   HPI: Megan Wilkerson is a 52 y.o. female with medical history significant for asthma and allergies with anaphylactic reactions, now presenting to the emergency department after a series of seizures.  Patient does not have history of seizures.  She is accompanied by her husband and sister who assist with the history.  Patient was reportedly in her usual state of health this morning, went to church with her sister, and was noted to have generalized shaking with loss of consciousness.  This lasted for at least a couple minutes per report of the patient's sister, but soon after she stopped shaking, it started again.  EMS was called and brought the patient into the ED.  There has not been any recent fall, trauma, use of alcohol or illicit substances, use of benzodiazepines, or any complaints from the patient. She has a sister with seizure disorder.   ED Course: Upon arrival to the ED, patient is found to be afebrile, saturating well on room air, and with vitals otherwise stable.  Chest x-ray is negative for acute cardiopulmonary disease and noncontrast head CT is negative for acute intracranial abnormality.  Chemistry panel is notable for mild hypokalemia and CBC is unremarkable.  Ethanol level is undetectable.  Patient had additional seizures in the emergency department for a total of 5 or 6.  She was treated with Ativan in the ED and then loaded with a gram of Keppra.  Neurology was consulted by the ED physician and recommended admission to [facility] with MRI brain and EEG.  Review of Systems:  All other systems reviewed and apart from HPI, are negative.  Past Medical History:  Diagnosis Date  . Anaphylactic reaction    several ED visits for same since 01/2012  . Asthma   . GERD   . Hives of unknown origin    chronic and recurrent urticaria since 01/2012  . HYPERLIPIDEMIA, WITH LOW HDL   . MIGRAINE HEADACHE   . PERIPHERAL NEUROPATHY   . POSITIVE PPD 1987   s/p 63mo tx  . Seizures ([facility])     Past Surgical History:  Procedure Laterality Date  . ABDOMINAL HYSTERECTOMY  1996  . APPENDECTOMY  1992  . CESAREAN SECTION  1989  . CHOLECYSTECTOMY  04/22/2011   Procedure: LAPAROSCOPIC CHOLECYSTECTOMY WITH INTRAOPERATIVE CHOLANGIOGRAM;  Surgeon: Almond Lint, MD;  Location: [facility] OR;  Service: General;  Laterality: N/A;  . CYST REMOVAL HAND Right 1997     reports that she has never smoked. She has never used smokeless tobacco. She reports that she does not drink alcohol or use drugs.  Allergies  Allergen Reactions  . Milk-Related Compounds     Mouth itching, dizziness, coughing, wheezing and SOB  . Other Anaphylaxis    Archie Balboa rancher gummies and paprika   . Peanuts [Peanut Oil] Anaphylaxis and Itching  . Corn Dextrin Other (See Comments)    Plain corn ok, popcorn causes hives  . Gluten Meal Other (See Comments)    cough  . Shellfish Allergy Itching  . Soy Allergy Other (See Comments)    coughing  . Sunflower Oil Itching  . Tomato Itching  . Wheat Bran     swelling  . Morphine Itching and Rash    Family History  Problem Relation Age of Onset  . Prostate cancer Father   .  Hypertension Father   . Heart disease Father   . Diabetes Mother   . Diabetes Brother   . Alcohol abuse Other   . Arthritis Other   . Hypertension Other   . Seizures Sister      Prior to Admission medications   Medication Sig Start Date End Date Taking? Authorizing Provider  albuterol (PROVENTIL HFA;VENTOLIN HFA) 108 (90 Base) MCG/ACT inhaler Inhale 2 puffs into the lungs every 6 (six) hours as needed for wheezing or shortness of breath. 12/19/17  Yes Kozlow, Alvira Philips, MD  albuterol (PROVENTIL) (2.5 MG/3ML) 0.083% nebulizer solution Take 3 mLs (2.5 mg total) by nebulization every 6 (six) hours as needed for wheezing  or shortness of breath. 02/11/15  Yes Young, Joni Fears D, MD  brimonidine (ALPHAGAN) 0.15 % ophthalmic solution Place 1 drop into both eyes 2 (two) times daily.   Yes [provider]  budesonide-formoterol (SYMBICORT) 160-4.5 MCG/ACT inhaler Inhale two puffs twice daily to prevent cough or wheeze. Use with spacer.  Rinse, gargle, and spit after use. 12/04/17  Yes Myrlene Broker, MD  dexlansoprazole (DEXILANT) 60 MG capsule Take 1 capsule (60 mg total) by mouth daily. 12/19/17  Yes Kozlow, Alvira Philips, MD  EPINEPHrine 0.3 mg/0.3 mL IJ SOAJ injection Inject 0.3 mLs (0.3 mg total) into the muscle once for 1 dose. 11/27/17 04/22/18 Yes Benjiman Core, MD  fexofenadine (ALLEGRA) 180 MG tablet Take 180 mg by mouth daily.   Yes [provider]  fluticasone (FLONASE) 50 MCG/ACT nasal spray Place 2 sprays into both nostrils daily. 12/04/17  Yes Myrlene Broker, MD  hydrOXYzine (ATARAX/VISTARIL) 25 MG tablet Take 1 tablet (25 mg total) by mouth 3 (three) times daily as needed for itching. 02/15/16  Yes [place], Betty G, MD  latanoprost (XALATAN) 0.005 % ophthalmic solution Place 1 drop into both eyes at bedtime.  10/31/17  Yes [provider]  montelukast (SINGULAIR) 10 MG tablet Take 1 tablet (10 mg total) by mouth daily. 12/04/17  Yes Myrlene Broker, MD  Nebulizers (COMPRESSOR/NEBULIZER) MISC As directed 02/11/15  Yes Young, Joni Fears D, MD  pantoprazole (PROTONIX) 40 MG tablet Take 1 tablet (40 mg total) by mouth daily. 12/21/17  Yes Kozlow, Alvira Philips, MD  Pyridoxine HCl (B-6 PO) Take 1 tablet by mouth daily at 12 noon.   Yes [provider]  ranitidine (ZANTAC) 300 MG capsule Take 1 capsule (300 mg total) by mouth every evening. 12/19/17  Yes Kozlow, Alvira Philips, MD    Physical Exam: Vitals:   04/22/18 1845 04/22/18 1915 04/22/18 2000 04/22/18 2030  BP: (!) 136/121 (!) 136/98 125/85 (!) 119/103  Pulse: 91 78 79 79  Resp: (!) 22 (!) 21 17 16   Temp:      TempSrc:      SpO2:  100% 93% 90% 90%    Constitutional: NAD, obtunded  Eyes: PERTLA, lids and conjunctivae normal ENMT: Mucous membranes are moist. Posterior pharynx clear of any exudate or lesions.   Neck: normal, supple, no masses, no thyromegaly Respiratory: clear to auscultation bilaterally, no wheezing, no crackles. Normal respiratory effort.    Cardiovascular: S1 & S2 heard, regular rate and rhythm. No extremity edema.   Abdomen: No distension, no tenderness, soft. Bowel sounds normal.  Musculoskeletal: no clubbing / cyanosis. No joint deformity upper and lower extremities.    Skin: no significant rashes, lesions, ulcers. Warm, dry, well-perfused. Neurologic: No facial asymmetry, PERRL. Sensation intact. Moving all extremities. Obtunded, wakes briefly to painful stimuli and answers yes/no  questions.    Labs on Admission: I have personally reviewed following labs and imaging studies  CBC: Recent Labs  Lab 04/22/18 1621  WBC 6.8  NEUTROABS 4.2  HGB 14.4  HCT 43.6  MCV 89.2  PLT 218   Basic Metabolic Panel: Recent Labs  Lab 04/22/18 1621 04/22/18 1622  NA 143  --   K 3.3*  --   CL 108  --   CO2 26  --   GLUCOSE 133*  --   BUN 15  --   CREATININE 1.05*  --   CALCIUM 9.2  --   MG  --  2.1   GFR: CrCl cannot be calculated (Unknown ideal weight.). Liver Function Tests: Recent Labs  Lab 04/22/18 1621  AST 18  ALT 13  ALKPHOS 67  BILITOT 0.4  PROT 7.6  ALBUMIN 4.2   No results for input(s): LIPASE, AMYLASE in the last 168 hours. No results for input(s): AMMONIA in the last 168 hours. Coagulation Profile: No results for input(s): INR, PROTIME in the last 168 hours. Cardiac Enzymes: No results for input(s): CKTOTAL, CKMB, CKMBINDEX, TROPONINI in the last 168 hours. BNP (last 3 results) No results for input(s): PROBNP in the last 8760 hours. HbA1C: No results for input(s): HGBA1C in the last 72 hours. CBG: Recent Labs  Lab 04/22/18 1720  GLUCAP 91   Lipid Profile: No  results for input(s): CHOL, HDL, LDLCALC, TRIG, CHOLHDL, LDLDIRECT in the last 72 hours. Thyroid Function Tests: No results for input(s): TSH, T4TOTAL, FREET4, T3FREE, THYROIDAB in the last 72 hours. Anemia Panel: No results for input(s): VITAMINB12, FOLATE, FERRITIN, TIBC, IRON, RETICCTPCT in the last 72 hours. Urine analysis:    Component Value Date/Time   COLORURINE YELLOW 07/23/2014 1208   APPEARANCEUR CLEAR 07/23/2014 1208   LABSPEC 1.020 07/23/2014 1208   PHURINE 6.0 07/23/2014 1208   GLUCOSEU NEGATIVE 07/23/2014 1208   HGBUR SMALL (A) 07/23/2014 1208   BILIRUBINUR NEGATIVE 07/23/2014 1208   KETONESUR NEGATIVE 07/23/2014 1208   PROTEINUR NEGATIVE 04/21/2011 0624   UROBILINOGEN 0.2 07/23/2014 1208   NITRITE NEGATIVE 07/23/2014 1208   LEUKOCYTESUR SMALL (A) 07/23/2014 1208   Sepsis Labs: @LABRCNTIP (procalcitonin:4,lacticidven:4) )No results found for this or any previous visit (from the past 240 hour(s)).   Radiological Exams on Admission: Ct Head W & Wo Contrast  Result Date: 04/22/2018 CLINICAL DATA:  Witnessed seizure at church. History of migraine, seizures and hyperlipidemia. EXAM: CT HEAD WITHOUT AND WITH CONTRAST TECHNIQUE: Contiguous axial images were obtained from the base of the skull through the vertex without and with intravenous contrast CONTRAST:  75mL OMNIPAQUE IOHEXOL 300 MG/ML  SOLN COMPARISON:  CT HEAD March 21, 2012 FINDINGS: BRAIN: No intraparenchymal hemorrhage, mass effect nor midline shift. The ventricles and sulci are normal. No acute large vascular territory infarcts. No abnormal extra-axial fluid collections. No abnormal intraparenchymal or extra-axial enhancement. Basal cisterns are patent. VASCULAR: Unremarkable. SKULL/SOFT TISSUES: No skull fracture. Empty sella. No significant soft tissue swelling. ORBITS/SINUSES: The included ocular globes and orbital contents are normal.Trace paranasal sinus mucosal thickening. Mastoid air cells are well aerated.  OTHER: None. IMPRESSION: 1. No acute intracranial process. 2. Empty sella. Otherwise unremarkable CT HEAD with and without contrast. Electronically Signed   By: Awilda Metro M.D.   On: 04/22/2018 19:21    EKG: Not performed.   Assessment/Plan   1. Seizures  - Patient does not have hx of szr and now presents after a series of back-to-back episodes of generalized shaking with another  2 episodes in ED  - There is no hx of EtOH or illicit substance use, fall or trauma, or prior seizures  - Head CT with no acute findings; somnolent on exam without focal findings  - She was treated with Ativan and given a gram of Keppra in ED  - Neurology is consulting and much appreciated, recommended admission to [facility] for MRI brain with and w/o, EEG, continued Keppra - Continue seizure precautions, Keppra 500 mg BID, neuro checks, supportive care, check MRI brain and EEG    2. Asthma  - No wheezing, cough, or dyspnea on admission  - Continue ICS/LABA, Singulair, and as-needed albuterol    3. Hypokalemia  - Serum potassium is 3.3 on admission, treated with 20 mEq IV potassium  - Mag level wnl  - Repeat chem panel in am    DVT prophylaxis: Lovenox Code Status: Full  Family Communication: Husband and sister updated at bedside Consults called: Neurology Admission status: Inpatient    Briscoe Deutscher, MD [facility] Pager [phone]  If 7PM-7AM, please contact night-coverage [email or web address] Password [facility]  04/22/2018, 8:48 PM

## 2018-04-22 NOTE — ED Notes (Signed)
Bed: [facility] Expected date:  Expected time:  Means of arrival:  Comments: seizure 

## 2018-04-22 NOTE — ED Triage Notes (Signed)
Patient is unable to answer triage questions due to neurological status. Patient is disoriented to situation and time.

## 2018-04-22 NOTE — ED Notes (Signed)
PA Abigail at bedside.  

## 2018-04-23 ENCOUNTER — Inpatient Hospital Stay (HOSPITAL_COMMUNITY): Payer: Managed Care, Other (non HMO)

## 2018-04-23 ENCOUNTER — Inpatient Hospital Stay (HOSPITAL_COMMUNITY)
Admit: 2018-04-23 | Discharge: 2018-04-23 | Disposition: A | Discharge status: Home / Self Care | Payer: Managed Care, Other (non HMO) | Attending: Family Medicine | Admitting: Family Medicine

## 2018-04-23 LAB — RAPID URINE DRUG SCREEN, HOSP PERFORMED
Amphetamines: NOT DETECTED
BARBITURATES: NOT DETECTED
BENZODIAZEPINES: POSITIVE — AB
Cocaine: NOT DETECTED
Opiates: NOT DETECTED
TETRAHYDROCANNABINOL: NOT DETECTED

## 2018-04-23 LAB — CBC WITH DIFFERENTIAL/PLATELET
Abs Immature Granulocytes: 0.02 10*3/uL (ref 0.00–0.07)
BASOS ABS: 0.1 10*3/uL (ref 0.0–0.1)
Basophils Relative: 1 %
EOS ABS: 0.2 10*3/uL (ref 0.0–0.5)
Eosinophils Relative: 4 %
HCT: 43.2 % (ref 36.0–46.0)
Hemoglobin: 13.9 g/dL (ref 12.0–15.0)
IMMATURE GRANULOCYTES: 0 %
Lymphocytes Relative: 32 %
Lymphs Abs: 1.6 10*3/uL (ref 0.7–4.0)
MCH: 29.6 pg (ref 26.0–34.0)
MCHC: 32.2 g/dL (ref 30.0–36.0)
MCV: 91.9 fL (ref 80.0–100.0)
Monocytes Absolute: 0.4 10*3/uL (ref 0.1–1.0)
Monocytes Relative: 7 %
NEUTROS PCT: 56 %
Neutro Abs: 2.8 10*3/uL (ref 1.7–7.7)
Platelets: 204 10*3/uL (ref 150–400)
RBC: 4.7 MIL/uL (ref 3.87–5.11)
RDW: 12 % (ref 11.5–15.5)
WBC: 5 10*3/uL (ref 4.0–10.5)
nRBC: 0 % (ref 0.0–0.2)

## 2018-04-23 LAB — CBG MONITORING, ED: GLUCOSE-CAPILLARY: 91 mg/dL (ref 70–99)

## 2018-04-23 LAB — BASIC METABOLIC PANEL
ANION GAP: 10 (ref 5–15)
BUN: 11 mg/dL (ref 6–20)
CALCIUM: 8.9 mg/dL (ref 8.9–10.3)
CO2: 23 mmol/L (ref 22–32)
Chloride: 108 mmol/L (ref 98–111)
Creatinine, Ser: 0.74 mg/dL (ref 0.44–1.00)
GFR calc Af Amer: >60 mL/min (ref >60)
GLUCOSE: 109 mg/dL — AB (ref 70–99)
Potassium: 3.6 mmol/L (ref 3.5–5.1)
Sodium: 141 mmol/L (ref 135–145)

## 2018-04-23 MED ORDER — LORAZEPAM 2 MG/ML IJ SOLN
INTRAMUSCULAR | Status: AC
  Filled 2018-04-23: qty 1

## 2018-04-23 MED ORDER — LORAZEPAM 2 MG/ML IJ SOLN
1.0000 mg | Freq: Four times a day (QID) | INTRAMUSCULAR | Status: DC | PRN
  Administered 2018-04-24: 1 mg via INTRAVENOUS
  Filled 2018-04-23: qty 1

## 2018-04-23 MED ORDER — SODIUM CHLORIDE 0.9 % IV SOLN
750.0000 mg | Freq: Two times a day (BID) | INTRAVENOUS | Status: DC
  Administered 2018-04-23 – 2018-04-24 (×2): 750 mg via INTRAVENOUS
  Filled 2018-04-23 (×3): qty 7.5

## 2018-04-23 MED ORDER — LEVETIRACETAM 500 MG PO TABS: 500.0000 mg | ORAL_TABLET | Freq: Two times a day (BID) | ORAL | 0 refills | Status: DC

## 2018-04-23 MED ORDER — LEVETIRACETAM 500 MG PO TABS
500.0000 mg | ORAL_TABLET | Freq: Once | ORAL | Status: AC
  Administered 2018-04-23: 500 mg via ORAL
  Filled 2018-04-23: qty 1

## 2018-04-23 MED ORDER — POTASSIUM CHLORIDE 10 MEQ/100ML IV SOLN
10.0000 meq | Freq: Once | INTRAVENOUS | Status: AC
  Administered 2018-04-23: 10 meq via INTRAVENOUS
  Filled 2018-04-23: qty 100

## 2018-04-23 MED ORDER — GADOBUTROL 1 MMOL/ML IV SOLN
10.0000 mL | Freq: Once | INTRAVENOUS | Status: AC | PRN
  Administered 2018-04-23: 10 mL via INTRAVENOUS

## 2018-04-23 MED ORDER — LORAZEPAM 2 MG/ML IJ SOLN
1.0000 mg | Freq: Once | INTRAMUSCULAR | Status: AC
  Administered 2018-04-23: 1 mg via INTRAVENOUS

## 2018-04-23 MED ORDER — POTASSIUM CHLORIDE CRYS ER 20 MEQ PO TBCR
40.0000 meq | EXTENDED_RELEASE_TABLET | Freq: Once | ORAL | Status: AC
  Administered 2018-04-23: 40 meq via ORAL
  Filled 2018-04-23: qty 2

## 2018-04-23 NOTE — Procedures (Signed)
History: New onset seizures  Sedation: None  Technique: This is a 21 channel routine scalp EEG performed at the bedside with bipolar and monopolar montages arranged in accordance to the international 10/20 system of electrode placement. One channel was dedicated to EKG recording.    Background: The background consists of intermixed alpha and beta activities. There is a well defined posterior dominant rhythm of 11-12 Hz that attenuates with eye opening. Sleep is recorded with normal appearing structures.   Photic stimulation: Physiologic driving is not performed  EEG Abnormalities: None  Clinical Interpretation: This normal EEG is recorded in the waking and sleep state. There was no seizure or seizure predisposition recorded on this study. Please note that a normal EEG does not preclude the possibility of epilepsy.   Ritta SlotMcNeill Kirkpatrick, MD [facility] [phone]  If 7pm- 7am, please page neurology on call as listed in AMION.

## 2018-04-23 NOTE — Progress Notes (Signed)
Family stated pt has not wore cpap at home for months.  Cpap held overnight due to unresponsiveness and seizure activity.  RT will follow and assist with cpap when indicated.

## 2018-04-23 NOTE — ED Notes (Signed)
PT given Keppra PO and will be monitored for a reaction per discharge doc who consulted to pharmacy.

## 2018-04-23 NOTE — Progress Notes (Deleted)
Subjective: Patient is very lethargic.  Initially walking into the room she would not wake up even with sternal rub however she would moan.  After a little bit patient did wake up stated that she has not had any further issues.  When asked [facility] but does not remember any events.  Exam: Vitals:   04/23/18 0721 04/23/18 0746  BP:  (!) 164/107  Pulse: 74 66  Resp: (!) 21 19  Temp:    SpO2: 98% 98%    Physical Exam   HEENT-  Normocephalic, no lesions, without obvious abnormality.  Normal external eye and conjunctiva.   Extremities- Warm, dry and intact Musculoskeletal-no joint tenderness, deformity or swelling Skin-warm and dry, no hyperpigmentation, vitiligo, or suspicious lesions    Neuro:  Mental Status: Patient is extremely drowsy.  Answering questions but moaning when she answers questions.  Patient states that she does know that she is at [facility] but when asked the year she just stared at me and then closed her eyes and would not give answer, when asked the month again stared at me closed her eyes and eventually set October.  However she briskly was able to follow commands and do three-step commands such as taking her thumb-touching her nose-pointing to the ceiling.  She was able to name objects. Cranial Nerves: II:  Visual fields grossly normal,  III,IV, VI: ptosis not present, extra-ocular motions intact bilaterally pupils equal, round, reactive to light and accommodation V,VII: smile symmetric, facial light touch sensation normal bilaterally VIII: hearing normal bilaterally IX,X: uvula rises midline XI: bilateral shoulder shrug XII: midline tongue extension Motor: 4/5 throughout with at times limited effort Sensory: Pinprick and light touch intact throughout, bilaterally Deep Tendon Reflexes: 2+ and symmetric throughout Plantars: Right: downgoing   Left: downgoing Cerebellar: normal finger-to-nose,     Medications:   Scheduled: . brimonidine  1 drop Both Eyes BID  . enoxaparin (LOVENOX) injection  40 mg Subcutaneous Q24H  . famotidine  40 mg Oral QHS  . latanoprost  1 drop Both Eyes QHS  . mometasone-formoterol  2 puff Inhalation BID  . montelukast  10 mg Oral QHS  . pantoprazole  40 mg Oral Daily  . sodium chloride flush  3 mL Intravenous Q12H   Continuous: . levETIRAcetam Stopped (04/23/18 0753)   ZOX:[id number] **OR** acetaminophen, albuterol, ondansetron **OR** ondansetron (ZOFRAN) IV  Pertinent Labs/Diagnostics:   Ct Head W & Wo Contrast  Result Date: 04/22/2018 CLINICAL DATA:. IMPRESSION: 1. No acute intracranial process. 2. Empty sella. Otherwise unremarkable CT HEAD with and without contrast. Electronically Signed   By: Awilda Metro M.D.   On: 04/22/2018 19:21   Mr Laqueta Jean JY Contrast  Result Date: 04/23/2018 CLINICAL DATA:  52 year old female with new onset seizure activity. First seizure at church yesterday.  IMPRESSION: 1. Normal MRI appearance of the brain aside from partially empty sella, which can be a normal variant or associated with idiopathic intracranial hypertension (pseudotumor cerebri). 2. Mild bilateral petrous air cell fluid is nonspecific but appears inconsequential. Electronically Signed   By: Odessa Fleming M.D.   On: 04/23/2018 07:19   EEG: Pending  Felicie Morn PA-C [facility] [phone]   Assessment: 52 year old female presenting for evaluation of multiple seizures that happened in church yesterday however some of the semiology of the seizures were concerning for possible nonorganic etiology.  Currently on Keppra 500 mg twice daily with no further seizures.  Patient has not required any  further Ativan for seizure activity since yesterday.    Recommendations: -Continue Keppra 500 mg twice daily -Continue seizure precautions -EEG if normal no need to transfer to [facility].  --if no further spells and awake may be discharged and follow up with out  patient neurology  -Per [facility] [place] statutes, patients with seizures are not allowed to drive until  they have been seizure-free for six months. Use caution when using heavy equipment or power tools. Avoid working on ladders or at heights. Take showers instead of baths. Ensure the water temperature is not too high on the home water heater. Do not go swimming alone. When caring for infants or small children, sit down when holding, feeding, or changing them to minimize risk of injury to the child in the event you have a seizure.   Also, Maintain good sleep hygiene. Avoid alcohol.    04/23/2018, 8:47 AM

## 2018-04-23 NOTE — ED Notes (Signed)
Lab obtained CBC and CMP

## 2018-04-23 NOTE — ED Notes (Addendum)
Attempted to call [facility] to [id number]. RN unable to take report. RN to call back for report.

## 2018-04-23 NOTE — ED Notes (Signed)
Pts allergy list provided to nutritional services by Forestine NaKaren secretary

## 2018-04-23 NOTE — ED Notes (Signed)
Unable to draw labs off pts existing IV. Will look for another site to obtain labs

## 2018-04-23 NOTE — ED Notes (Signed)
Provided patient's family an update with bed status. Unsure of how long it will take for an assigned bed. Victorino DikeJennifer, RN (Press photographercharge nurse) is going to ask Gabriel RungJoe, RN [facility] about [facility] at [facility].

## 2018-04-23 NOTE — ED Notes (Signed)
Per neurology pt will not be [facility] to [facility] and will be discharged home.

## 2018-04-23 NOTE — Progress Notes (Addendum)
PROGRESS NOTE    Megan Wilkerson  GEX:[id number] DOB: 1965-12-01 DOA: 04/22/2018 PCP: Myrlene Broker, MD  Brief Narrative:  Megan Wilkerson is Megan Wilkerson 52 y.o. female with medical history significant for asthma and allergies with anaphylactic reactions, now presenting to the emergency department after Megan Wilkerson series of seizures.  Patient does not have history of seizures.  She is accompanied by her husband and sister who assist with the history.  Patient was reportedly in her usual state of health this morning, went to church with her sister, and was noted to have generalized shaking with loss of consciousness.  This lasted for at least Megan Wilkerson couple minutes per report of the patient's sister, but soon after she stopped shaking, it started again.  EMS was called and brought the patient into the ED.  There has not been any recent fall, trauma, use of alcohol or illicit substances, use of benzodiazepines, or any complaints from the patient. She has Megan Wilkerson sister with seizure disorder.   Assessment & Plan:   Principal Problem:   Seizures (HCC) Active Problems:   Asthma   Hypokalemia   Obstructive sleep apnea   1. Seizures  - No prior history, but now has had several episodes concerning for seizures - Initially planning to discharge today based on neuro recs from 11/25 AM, but she had another episode of generalized shaking ~1700 lasting about 3 minutes followed by 4-5 episodes where she was described as "tensing" up (per discussion with EDP, appreciate Dr. Christoper Fabian assistance during these episodes, one of these episodes she was able to stop with sternal rub).  She was given two 1 mg doses of ativan.  She was notably noted to vomit during the first episode of generalized shaking (nurse present at that time and pt was on her side and did not aspirate - monitor closely).  - Will increase keppra to 750, now [facility] for [facility] (discussed with neuro, appreciate Dr. Amada Jupiter)  -  There is no hx of EtOH or illicit substance use, fall or trauma, or prior seizures  - Head CT with no acute findings - MRI notable for partially empty sella (normal variant or assoicated with pseudotumor cerebri) as well as bilateral petrous air cell fluid  - EEG read pending, but normal per discussion with Dr. Amada Jupiter  - Neurology is consulting appreciate recs, planning for [facility] to [facility] as noted above - Continue seizure precautions, neuro checks    2. Asthma  - No wheezing, cough, or dyspnea on admission  - Continue ICS/LABA, Singulair, and as-needed albuterol    3. Hypokalemia  - improved, continue to monitor  DVT prophylaxis: lovenox Code Status: full  Family Communication: husband, sister Disposition Plan: inpatient given recurrent seizures requiring further inpatient neurology evaluation and extended monitoring   Consultants:   neurology  Procedures:   EEG (formal read pending)  Antimicrobials:  Anti-infectives (From admission, onward)   None     Subjective: Initially Megan Wilkerson&Ox3 and appropriate, ready to go home when this was the plan After repeat seizures this afternoon, Megan Wilkerson&Ox1-2.  Somnolent.    Objective: Vitals:   04/23/18 1200 04/23/18 1230 04/23/18 1300 04/23/18 1400  BP: (!) 131/92 127/90 (!) 127/109 (!) 136/93  Pulse: 70 79 82 72  Resp: 16 16 15 17   Temp:      TempSrc:      SpO2: 98% 97% 100% 98%    Intake/Output Summary (Last 24 hours) at 04/23/2018 1527 Last data filed at 04/23/2018 0815 Gross per  24 hour  Intake 196.65 ml  Output -  Net 196.65 ml   There were no vitals filed for this visit.  Examination:  General exam: Appears calm and comfortable  Respiratory system: Clear to auscultation. Respiratory effort normal. Cardiovascular system: S1 & S2 heard, RRR Gastrointestinal system: Abdomen is nondistended, soft and nontender Central nervous system: Somnolent and disoriented. Follows commands.  Moving all extremities.  No focal deficits  appreciated. Extremities: no LEE Skin: No rashes, lesions or ulcers Psychiatry: Judgement and insight appear normal. Mood & affect appropriate.     Data Reviewed: I have personally reviewed following labs and imaging studies  CBC: Recent Labs  Lab 04/22/18 1621 04/23/18 0800  WBC 6.8 5.0  NEUTROABS 4.2 2.8  HGB 14.4 13.9  HCT 43.6 43.2  MCV 89.2 91.9  PLT 218 204   Basic Metabolic Panel: Recent Labs  Lab 04/22/18 1621 04/22/18 1622 04/23/18 0800  NA 143  --  141  K 3.3*  --  3.6  CL 108  --  108  CO2 26  --  23  GLUCOSE 133*  --  109*  BUN 15  --  11  CREATININE 1.05*  --  0.74  CALCIUM 9.2  --  8.9  MG  --  2.1  --    GFR: CrCl cannot be calculated (Unknown ideal weight.). Liver Function Tests: Recent Labs  Lab 04/22/18 1621  AST 18  ALT 13  ALKPHOS 67  BILITOT 0.4  PROT 7.6  ALBUMIN 4.2   No results for input(s): LIPASE, AMYLASE in the last 168 hours. No results for input(s): AMMONIA in the last 168 hours. Coagulation Profile: No results for input(s): INR, PROTIME in the last 168 hours. Cardiac Enzymes: No results for input(s): CKTOTAL, CKMB, CKMBINDEX, TROPONINI in the last 168 hours. BNP (last 3 results) No results for input(s): PROBNP in the last 8760 hours. HbA1C: No results for input(s): HGBA1C in the last 72 hours. CBG: Recent Labs  Lab 04/22/18 1720 04/23/18 0846  GLUCAP 91 91   Lipid Profile: No results for input(s): CHOL, HDL, LDLCALC, TRIG, CHOLHDL, LDLDIRECT in the last 72 hours. Thyroid Function Tests: No results for input(s): TSH, T4TOTAL, FREET4, T3FREE, THYROIDAB in the last 72 hours. Anemia Panel: No results for input(s): VITAMINB12, FOLATE, FERRITIN, TIBC, IRON, RETICCTPCT in the last 72 hours. Sepsis Labs: No results for input(s): PROCALCITON, LATICACIDVEN in the last 168 hours.  No results found for this or any previous visit (from the past 240 hour(s)).       Radiology Studies: Ct Head W & Wo Contrast  Result  Date: 04/22/2018 CLINICAL DATA:  Witnessed seizure at church. History of migraine, seizures and hyperlipidemia. EXAM: CT HEAD WITHOUT AND WITH CONTRAST TECHNIQUE: Contiguous axial images were obtained from the base of the skull through the vertex without and with intravenous contrast CONTRAST:  75mL OMNIPAQUE IOHEXOL 300 MG/ML  SOLN COMPARISON:  CT HEAD March 21, 2012 FINDINGS: BRAIN: No intraparenchymal hemorrhage, mass effect nor midline shift. The ventricles and sulci are normal. No acute large vascular territory infarcts. No abnormal extra-axial fluid collections. No abnormal intraparenchymal or extra-axial enhancement. Basal cisterns are patent. VASCULAR: Unremarkable. SKULL/SOFT TISSUES: No skull fracture. Empty sella. No significant soft tissue swelling. ORBITS/SINUSES: The included ocular globes and orbital contents are normal.Trace paranasal sinus mucosal thickening. Mastoid air cells are well aerated. OTHER: None. IMPRESSION: 1. No acute intracranial process. 2. Empty sella. Otherwise unremarkable CT HEAD with and without contrast. Electronically Signed   By: Pernell Dupre  Bloomer M.D.   On: 04/22/2018 19:21   Mr Laqueta Jean FI Contrast  Result Date: 04/23/2018 CLINICAL DATA:  52 year old female with new onset seizure activity. First seizure at church yesterday. EXAM: MRI HEAD WITHOUT AND WITH CONTRAST TECHNIQUE: Multiplanar, multiecho pulse sequences of the brain and surrounding structures were obtained without and with intravenous contrast. CONTRAST:  10 milliliters Gadavist COMPARISON:  head CTs 04/22/2018 and earlier FINDINGS: Brain: Partially empty sella. Cerebral volume is within normal limits for age. No restricted diffusion to suggest acute infarction. No midline shift, mass effect, evidence of mass lesion, ventriculomegaly, extra-axial collection or acute intracranial hemorrhage. Cervicomedullary junction within normal limits. Wallace Cullens and white matter signal is within normal limits for age throughout  the brain. No cortical encephalomalacia or chronic cerebral blood products. On thin slice T2 coronal images the hippocampal formations appear symmetric and within normal limits. The deep gray matter nuclei, brainstem, and cerebellum appear normal. No abnormal enhancement identified. No dural thickening Vascular: Major intracranial vascular flow voids are preserved with mild intracranial artery tortuosity. The major dural venous sinuses are enhancing and appear patent. Skull and upper cervical spine: Negative visible cervical spine. Visualized bone marrow signal is within normal limits. Sinuses/Orbits: Negative. Other: There is Lion Fernandez small volume of petrous apex fluid greater on the left, significance is doubtful. The mastoids are clear. Other internal auditory structures appear normal. Scalp and face soft tissues appear negative. IMPRESSION: 1. Normal MRI appearance of the brain aside from partially empty sella, which can be Kathlen Sakurai normal variant or associated with idiopathic intracranial hypertension (pseudotumor cerebri). 2. Mild bilateral petrous air cell fluid is nonspecific but appears inconsequential. Electronically Signed   By: Odessa Fleming M.D.   On: 04/23/2018 07:19        Scheduled Meds: . brimonidine  1 drop Both Eyes BID  . enoxaparin (LOVENOX) injection  40 mg Subcutaneous Q24H  . famotidine  40 mg Oral QHS  . latanoprost  1 drop Both Eyes QHS  . mometasone-formoterol  2 puff Inhalation BID  . montelukast  10 mg Oral QHS  . pantoprazole  40 mg Oral Daily  . sodium chloride flush  3 mL Intravenous Q12H   Continuous Infusions: . levETIRAcetam Stopped (04/23/18 0753)     LOS: 1 day    Time spent: over 30 min    Lacretia Nicks, MD [facility] Pager [phone]  If 7PM-7AM, please contact night-coverage [email or web address] Password [facility] 04/23/2018, 3:27 PM

## 2018-04-23 NOTE — Progress Notes (Signed)
EEG Completed; Results Pending  

## 2018-04-23 NOTE — ED Notes (Signed)
Pt was able to ambulate with minimal assistance. Pt c/o being slightly weak/dizzy. Dizziness dissipated some once pt sat up for a few minutes.

## 2018-04-23 NOTE — ED Notes (Signed)
Pt has just returned from MRI.

## 2018-04-23 NOTE — Progress Notes (Signed)
Subjective: Patient is very lethargic.  Initially when I walked into the room she would not wake up but with sternal rub she quickly woke up but would only moan and not answer my questions with full words.  After sternal rub and giving a little time she did wake up.  She knew she was in the hospital but she does not recall the events.  No further seizures overnight  Exam: Vitals:   04/23/18 0746 04/23/18 0900  BP: (!) 164/107 (!) 133/93  Pulse: 66 75  Resp: 19 17  Temp:    SpO2: 98% 99%    Physical Exam   HEENT-  Normocephalic, no lesions, without obvious abnormality.  Normal external eye and conjunctiva.   Extremities- Warm, dry and intact Musculoskeletal-no joint tenderness, deformity or swelling Skin-warm and dry, no hyperpigmentation, vitiligo, or suspicious lesions    Neuro:  Mental Status: Patient is drowsy.  Answering questions but moaning initially when asked questions.  She does [facility] but stated she did not know the year or month.  Patient would often times look at me then closed her eyes almost refusing to answer questions.  However when asked to perform command she did so with no problem.  She was able to do three-step commands without any difficulty. Cranial Nerves: II:  Visual fields grossly normal,  III,IV, VI: ptosis not present, extra-ocular motions intact bilaterally pupils equal, round, reactive to light and accommodation V,VII: smile symmetric, facial light touch sensation normal bilaterally VIII: hearing normal bilaterally IX,X: uvula rises midline XI: bilateral shoulder shrug XII: midline tongue extension Motor: 4/5 throughout secondary to limited effort Tone and bulk:normal tone throughout; no atrophy noted Sensory: Pinprick and light touch intact throughout, bilaterally Deep Tendon Reflexes: 2+ and symmetric throughout Plantars: Right: downgoing   Left: downgoing Cerebellar: normal finger-to-nose,    Medications:   Scheduled: . brimonidine  1 drop Both Eyes BID  . enoxaparin (LOVENOX) injection  40 mg Subcutaneous Q24H  . famotidine  40 mg Oral QHS  . latanoprost  1 drop Both Eyes QHS  . mometasone-formoterol  2 puff Inhalation BID  . montelukast  10 mg Oral QHS  . pantoprazole  40 mg Oral Daily  . potassium chloride  40 mEq Oral Once  . sodium chloride flush  3 mL Intravenous Q12H   Continuous: . levETIRAcetam Stopped (04/23/18 0753)    Pertinent Labs/Diagnostics:   Ct Head W & Wo Contrast  Result Date: 04/22/2018 CLINICAL DATA:  Witnessed seizure at church. History of migraine, seizures and hyperlipidemia. EXAM: CT HEAD WITHOUT AND WITH CONTRAST TECHNIQUE: Contiguous axial images were obtained from the base of the skull through the vertex without and with intravenous contrast CONTRAST:  75mL OMNIPAQUE IOHEXOL 300 MG/ML  SOLN COMPARISON:  CT HEAD March 21, 2012 FINDINGS: BRAIN: No intraparenchymal hemorrhage, mass effect nor midline shift. The ventricles and sulci are normal. No acute large vascular territory infarcts. No abnormal extra-axial fluid collections. No abnormal intraparenchymal or extra-axial enhancement. Basal cisterns are patent. VASCULAR: Unremarkable. SKULL/SOFT TISSUES: No skull fracture. Empty sella. No significant soft tissue swelling. ORBITS/SINUSES: The included ocular globes and orbital contents are normal.Trace paranasal sinus mucosal thickening. Mastoid air cells are well aerated. OTHER: None. IMPRESSION: 1. No acute intracranial process. 2. Empty sella. Otherwise unremarkable CT HEAD with and without contrast. Electronically Signed   By: Awilda Metroourtnay  Bloomer M.D.   On: 04/22/2018 19:21   Mr Laqueta JeanBrain W WUWo Contrast  Result Date: 04/23/2018 CLINICAL DATA:  52 year old female with new onset seizure activity. First seizure at church yesterday. EXAM: MRI HEAD WITHOUT AND WITH CONTRAST TECHNIQUE: Multiplanar, multiecho pulse sequences of the brain and surrounding structures were  obtained without and with intravenous contrast. CONTRAST:  10 milliliters Gadavist COMPARISON:  head CTs 04/22/2018 and earlier FINDINGS: Brain: Partially empty sella. Cerebral volume is within normal limits for age. No restricted diffusion to suggest acute infarction. No midline shift, mass effect, evidence of mass lesion, ventriculomegaly, extra-axial collection or acute intracranial hemorrhage. Cervicomedullary junction within normal limits. Wallace Cullens and white matter signal is within normal limits for age throughout the brain. No cortical encephalomalacia or chronic cerebral blood products. On thin slice T2 coronal images the hippocampal formations appear symmetric and within normal limits. The deep gray matter nuclei, brainstem, and cerebellum appear normal. No abnormal enhancement identified. No dural thickening Vascular: Major intracranial vascular flow voids are preserved with mild intracranial artery tortuosity. The major dural venous sinuses are enhancing and appear patent. Skull and upper cervical spine: Negative visible cervical spine. Visualized bone marrow signal is within normal limits. Sinuses/Orbits: Negative. Other: There is a small volume of petrous apex fluid greater on the left, significance is doubtful. The mastoids are clear. Other internal auditory structures appear normal. Scalp and face soft tissues appear negative. IMPRESSION: 1. Normal MRI appearance of the brain aside from partially empty sella, which can be a normal variant or associated with idiopathic intracranial hypertension (pseudotumor cerebri). 2. Mild bilateral petrous air cell fluid is nonspecific but appears inconsequential. Electronically Signed   By: Odessa Fleming M.D.   On: 04/23/2018 07:19    Assessment: 52 year old female presenting for evaluation of multiple seizures that happened in church yesterday, however some of the exam initially was concerning for abnormal semiology and possible nonorganic etiology.  Currently on Keppra  500 mg twice daily with no further seizures.    Recommendations: -Continue Keppra 500 mg twice daily -Seizure precautions -EEG if normal no need to transfer to [facility] -If no further spells and still following commands without difficulty along with waking up patient may be discharged with follow-up with neurology  -Per [facility] statutes, patients with seizures are not allowed to drive until  they have been seizure-free for six months. Use caution when using heavy equipment or power tools. Avoid working on ladders or at heights. Take showers instead of baths. Ensure the water temperature is not too high on the home water heater. Do not go swimming alone. When caring for infants or small children, sit down when holding, feeding, or changing them to minimize risk of injury to the child in the event you have a seizure.   Also, Maintain good sleep hygiene. Avoid alcohol.  If EEG is normal and patient is waking up appropriately with no more spells neurology will sign off please call with any questions or further events occur.  Felicie Morn PA-C [facility] [phone] 04/23/2018, 9:34 AM

## 2018-04-23 NOTE — ED Notes (Signed)
Pt transported to MRI via wheelchair.

## 2018-04-23 NOTE — ED Notes (Addendum)
MD paged in regards to pt needing a diet order.

## 2018-04-23 NOTE — ED Notes (Signed)
[facility] called for pt 

## 2018-04-23 NOTE — ED Notes (Signed)
Family at bedside. 

## 2018-04-23 NOTE — Discharge Instructions (Signed)
Seizure, Adult °When you have Megan Wilkerson seizure: °· Parts of your body may move. °· How aware or awake (conscious) you are may change. °· You may shake (convulse). ° °Some people have symptoms right before [name] seizure happens. These symptoms may include: °· Fear. °· Worry (anxiety). °· Feeling like you are going to throw up (nausea). °· Feeling like the room is spinning (vertigo). °· Feeling like you saw or heard something before (deja vu). °· Odd tastes or smells. °· Changes in vision, such as seeing flashing lights or spots. ° °Seizures usually last from 30 seconds to 2 minutes. Usually, they are not harmful unless they last [name] long time. °Follow these instructions at home: °Medicines °· Take over-the-counter and prescription medicines only as told by your doctor. °· Avoid anything that may keep your medicine from working, such as alcohol. °Activity °· Do not do any activities that would be dangerous if you had another seizure, like driving or swimming. Wait until your doctor approves. °· If you live in the [place] ask your local DMV (department of motor vehicles) when you can drive. °· Rest. °Teaching others °· Teach friends and family what to do when you have [name] seizure. They should: °? Lay you on the ground. °? Protect your head and body. °? Loosen any tight clothing around your neck. °? Turn you on your side. °? Stay with you until you are better. °? Not hold you down. °? Not put anything in your mouth. °? Know whether or not you need emergency care. °General instructions °· Contact your doctor each time you have [name] seizure. °· Avoid anything that gives you seizures. °· Keep [name] seizure diary. Write down: °? What you think caused each seizure. °? What you remember about each seizure. °· Keep all follow-up visits as told by your doctor. This is important. °Contact [name] doctor if: °· You have another seizure. °· You have seizures more often. °· There is any change in what happens during your seizures. °· You continue to have  seizures with treatment. °· You have symptoms of being sick or having an infection. °Get help right away if: °· You have [name] seizure: °? That lasts longer than 5 minutes. °? That is different than seizures you had before. °? That makes it harder to breathe. °? After you hurt your head. °· After [name] seizure, you cannot speak or use [name] part of your body. °· After [name] seizure, you are confused or have [name] bad headache. °· You have two or more seizures in [name] row. °· You are having seizures more often. °· You do not wake up right after [name] seizure. °· You get hurt during [name] seizure. °In an emergency: °· These symptoms may be an emergency. Do not wait to see if the symptoms will go away. Get medical help right away. Call your local emergency services (911 in the [place] Do not drive yourself to the hospital. °This information is not intended to replace advice given to you by your health care provider. Make sure you discuss any questions you have with your health care provider. °Document Released: 11/02/2007 Document Revised: 01/27/2016 Document Reviewed: 01/27/2016 °Elsevier Interactive Patient Education © 2017 [employer] ° °

## 2018-04-23 NOTE — ED Notes (Signed)
Neuro PA at bedside.  

## 2018-04-23 NOTE — Progress Notes (Signed)
Pt refused cpap tonight.  RN aware.  Pt was advised that RT is available all night should she change her mind.

## 2018-04-23 NOTE — ED Notes (Addendum)
Pts husband came and told this RN that pt was having a seizure. Pt was having a grand mal seizure and starting vomiting when this RN walked into the room. MD Belfi notified. Pt was in the recovery position during vomiting. Pt was given NRB for oxygen after seizure. Seizure lasted around 3 min. Pt had a total of 3 seizures after the grand mal. These were tonic (tensing motion) in nature and not clonic. The first 2 lasted around 30-45 seconds and the 4th and final seizure lasted around 3 minutes. Pt regained consciousness after the grand mal and then did not regain consciousness again until after the 4th seizure. Powell M.D notified twice and he came to see the pt before her final seizure.

## 2018-04-23 NOTE — ED Notes (Signed)
EEG at bedside.

## 2018-04-24 ENCOUNTER — Encounter (HOSPITAL_COMMUNITY): Payer: Self-pay | Admitting: *Deleted

## 2018-04-24 ENCOUNTER — Inpatient Hospital Stay (HOSPITAL_COMMUNITY): Payer: Managed Care, Other (non HMO)

## 2018-04-24 DIAGNOSIS — R569 Unspecified convulsions: Secondary | ICD-10-CM

## 2018-04-24 LAB — COMPREHENSIVE METABOLIC PANEL
ALBUMIN: 3.8 g/dL (ref 3.5–5.0)
ALT: 13 U/L (ref 0–44)
AST: 16 U/L (ref 15–41)
Alkaline Phosphatase: 67 U/L (ref 38–126)
Anion gap: 8 (ref 5–15)
BILIRUBIN TOTAL: 0.7 mg/dL (ref 0.3–1.2)
BUN: 9 mg/dL (ref 6–20)
CO2: 26 mmol/L (ref 22–32)
Calcium: 9.2 mg/dL (ref 8.9–10.3)
Chloride: 109 mmol/L (ref 98–111)
Creatinine, Ser: 0.72 mg/dL (ref 0.44–1.00)
GFR calc Af Amer: >60 mL/min (ref >60)
GFR calc non Af Amer: >60 mL/min (ref >60)
Glucose, Bld: 110 mg/dL — ABNORMAL HIGH (ref 70–99)
POTASSIUM: 3.5 mmol/L (ref 3.5–5.1)
Sodium: 143 mmol/L (ref 135–145)
TOTAL PROTEIN: 7.1 g/dL (ref 6.5–8.1)

## 2018-04-24 LAB — CBC
HEMATOCRIT: 43.3 % (ref 36.0–46.0)
Hemoglobin: 14.2 g/dL (ref 12.0–15.0)
MCH: 29.8 pg (ref 26.0–34.0)
MCHC: 32.8 g/dL (ref 30.0–36.0)
MCV: 90.8 fL (ref 80.0–100.0)
Platelets: 206 10*3/uL (ref 150–400)
RBC: 4.77 MIL/uL (ref 3.87–5.11)
RDW: 11.9 % (ref 11.5–15.5)
WBC: 5.2 10*3/uL (ref 4.0–10.5)
nRBC: 0 % (ref 0.0–0.2)

## 2018-04-24 LAB — MAGNESIUM: Magnesium: 2.1 mg/dL (ref 1.7–2.4)

## 2018-04-24 MED ORDER — LORAZEPAM 2 MG/ML IJ SOLN
INTRAMUSCULAR | Status: AC
  Administered 2018-04-24: 16:00:00 via INTRAMUSCULAR
  Filled 2018-04-24: qty 1

## 2018-04-24 MED ORDER — LORAZEPAM 2 MG/ML IJ SOLN
1.0000 mg | Freq: Four times a day (QID) | INTRAMUSCULAR | Status: DC | PRN
  Administered 2018-04-25: 1 mg via INTRAVENOUS
  Filled 2018-04-24: qty 1

## 2018-04-24 MED ORDER — AMMONIA AROMATIC IN INHA
0.3000 mL | Freq: Once | RESPIRATORY_TRACT | Status: DC
  Filled 2018-04-24: qty 10

## 2018-04-24 NOTE — Progress Notes (Addendum)
Subjective: Patient at this point has no further complaints.  She had no further seizures overnight.  Her last seizures were at 1721 hrs. on 04/23/2018.  Patient is able to follow all commands.  She is alert, watching TV.  And appears comfortable.  When asked if she has any questions she denied any questions.  Patient denies any stressors that are at home.  Exam: Vitals:   04/24/18 0600 04/24/18 0700  BP: (!) 133/91 133/88  Pulse: 72 68  Resp: 19 (!) 9  Temp:    SpO2: 95% 97%    Physical Exam   HEENT-  Normocephalic, no lesions, without obvious abnormality.  Normal external eye and conjunctiva.   Extremities- Warm, dry and intact Musculoskeletal-no joint tenderness, deformity or swelling Skin-warm and dry, no hyperpigmentation, vitiligo, or suspicious lesions    Neuro:  Mental Status: Alert, oriented oriented to hospital, month, where she previous worked and also what she did in her previous job.  When asked what year it is she thought for quite a long time and was unable to give me an answer and then stated 2012.  Patient is able to follow three-step commands.  Voice is clear, coherent, no a aphasia or dysarthria. Cranial Nerves: II:  Visual fields grossly normal,  III,IV, VI: ptosis not present, extra-ocular motions intact bilaterally pupils equal, round, reactive to light and accommodation V,VII: smile symmetric, facial light touch sensation normal bilaterally VIII: hearing normal bilaterally IX,X: uvula rises midline XI: bilateral shoulder shrug XII: midline tongue extension Motor: Right : Upper extremity   5/5    Left:     Upper extremity   5/5  Lower extremity   5/5     Lower extremity   5/5 Tone and bulk:normal tone throughout; no atrophy noted Sensory: Pinprick and light touch intact throughout, bilaterally Deep Tendon Reflexes: 2+ and symmetric throughout upper extremities and knees no lower extremity DTRs Plantars: Right: downgoing   Left:  downgoing Cerebellar: normal finger-to-nose,  and normal heel-to-shin test Gait: Not tested    Medications:  Scheduled: . ammonia  0.3 mL Inhalation Once  . brimonidine  1 drop Both Eyes BID  . enoxaparin (LOVENOX) injection  40 mg Subcutaneous Q24H  . famotidine  40 mg Oral QHS  . latanoprost  1 drop Both Eyes QHS  . mometasone-formoterol  2 puff Inhalation BID  . montelukast  10 mg Oral QHS  . pantoprazole  40 mg Oral Daily  . sodium chloride flush  3 mL Intravenous Q12H   Continuous: . levETIRAcetam Stopped (04/24/18 0646)   ZOX:[id number] **OR** acetaminophen, albuterol, LORazepam, ondansetron **OR** ondansetron (ZOFRAN) IV  Pertinent Labs/Diagnostics: -Magnesium 2.1  -EEG: This is a normal EEG recording in the waking and sleep state.  There was no seizure or seizure predisposition recorded.   Assessment: 52 year old female presenting for evaluation of multiple seizures which initially happened at church.  Patient was started on 500 mg of Keppra twice daily.  Yesterday he had further seizures on this medication thus Keppra was increased to 750 mg twice daily.  She has had no further seizures overnight.  At this point there is question for nonorganic etiology.  Patient will be transferred to [facility] for further evaluation    Recommendations: -Continue Keppra 750 mg twice daily - Transferrto Morven to telemetry for overnight EEG  -Per [employer], patients with seizures are not allowed to drive until  they have been seizure-free for six months. Use caution when using heavy equipment or power  tools. Avoid working on ladders or at heights. Take showers instead of baths. Ensure the water temperature is not too high on the home water heater. Do not go swimming alone. When caring for infants or small children, sit down when holding, feeding, or changing them to minimize risk of injury to the child in the event you have a seizure.   Also, Maintain good sleep  hygiene. Avoid alcohol.  Felicie MornDavid Smith PA-C [facility] [phone]   04/24/2018, 8:31 AM  I have seen the patient and reviewed the above note. She is awake, alert, interactive and appropriate. Will hold LEV tonight and monitor on EEG.   Ritta SlotMcNeill Kirkpatrick, MD [facility] [phone]  If 7pm- 7am, please page neurology on call as listed in [facility].

## 2018-04-24 NOTE — ED Notes (Signed)
ED TO INPATIENT HANDOFF REPORT  Name/Age/Gender Megan Wilkerson 52 y.o. female  Code Status    Code Status Orders  (From admission, onward)         Start     Ordered   04/22/18 2025  Full code  Continuous     04/22/18 2026        Code Status History    Date Active Date Inactive Code Status Order ID Comments User Context   03/10/2012 1709 03/10/2012 2337 Full Code [id number]  Orlie Dakin, MD ED   04/21/2011 1053 04/23/2011 1247 Full Code [id number]  Sheldon Silvan, RN Inpatient      Home/SNF/Other Home  Chief Complaint Seizure   Level of Care/Admitting Diagnosis ED Disposition    ED Disposition Condition [place]: [facility] [place] [100100]  Level of Care: Stepdown [14]  Diagnosis: Seizures [facility]) [024097]  Admitting Physician: Elodia Florence [phone]  Attending Physician: Cephus Slater, A [place] [phone]  Estimated length of stay: past midnight tomorrow  Certification:: I certify this patient will need inpatient services for at least 2 midnights  PT Class (Do Not Modify): Inpatient [101]  PT Acc Code (Do Not Modify): Private [1]       Medical History Past Medical History:  Diagnosis Date  . Anaphylactic reaction    several ED visits for same since 01/2012  . Asthma   . GERD   . Hives of unknown origin    chronic and recurrent urticaria since 01/2012  . HYPERLIPIDEMIA, WITH LOW HDL   . MIGRAINE HEADACHE   . PERIPHERAL NEUROPATHY   . 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 Seizures (HCC)     Allergies Allergies  Allergen Reactions  . Milk-Related Compounds     Mouth itching, dizziness, coughing, wheezing and SOB  . Other Anaphylaxis    JMarolyn Hammockrancher gummies and paprika   . Peanuts [Peanut Oil] Anaphylaxis and Itching  . Corn Dextrin Other (See Comments)    Plain corn ok, popcorn causes hives  . Gluten Meal Other (See Comments)    cough  . Shellfish Allergy Itching  . Soy Allergy Other (See Comments)   coughing  . Sunflower Oil Itching  . Tomato Itching  . Wheat Bran     swelling  . Morphine Itching and Rash    IV Location/Drains/Wounds Patient Lines/Drains/Airways Status   Active Line/Drains/Airways    Name:   Placement date:   Placement time:   Site:   Days:   Peripheral IV 04/22/18 Right Antecubital   04/22/18    1632    Antecubital   2          Labs/Imaging Results for orders placed or performed during the hospital encounter of 04/22/18 (from the past 48 hour(s))  CBC WITH DIFFERENTIAL     Status: None   Collection Time: 04/22/18  4:21 PM  Result Value Ref Range   WBC 6.8 4.0 - 10.5 K/uL   RBC 4.89 3.87 - 5.11 MIL/uL   Hemoglobin 14.4 12.0 - 15.0 g/dL   HCT 43.6 36.0 - 46.0 %   MCV 89.2 80.0 - 100.0 fL   MCH 29.4 26.0 - 34.0 pg   MCHC 33.0 30.0 - 36.0 g/dL   RDW 12.3 11.5 - 15.5 %   Platelets 218 150 - 400 K/uL   nRBC 0.0 0.0 - 0.2 %   Neutrophils Relative % 61 %   Neutro Abs 4.2 1.7 - 7.7  K/uL   Lymphocytes Relative 29 %   Lymphs Abs 2.0 0.7 - 4.0 K/uL   Monocytes Relative 6 %   Monocytes Absolute 0.4 0.1 - 1.0 K/uL   Eosinophils Relative 2 %   Eosinophils Absolute 0.1 0.0 - 0.5 K/uL   Basophils Relative 1 %   Basophils Absolute 0.1 0.0 - 0.1 K/uL   Immature Granulocytes 1 %   Abs Immature Granulocytes 0.04 0.00 - 0.07 K/uL    Comment: Performed at [facility], [place] [address], [place], [place] [address]  Comprehensive metabolic panel     Status: Abnormal   Collection Time: 04/22/18  4:21 PM  Result Value Ref Range   Sodium 143 135 - 145 mmol/L   Potassium 3.3 (L) 3.5 - 5.1 mmol/L   Chloride 108 98 - 111 mmol/L   CO2 26 22 - 32 mmol/L   Glucose, Bld 133 (H) 70 - 99 mg/dL   BUN 15 6 - 20 mg/dL   Creatinine, Ser 1.05 (H) 0.44 - 1.00 mg/dL   Calcium 9.2 8.9 - 10.3 mg/dL   Total Protein 7.6 6.5 - 8.1 g/dL   Albumin 4.2 3.5 - 5.0 g/dL   AST 18 15 - 41 U/L   ALT 13 0 - 44 U/L   Alkaline Phosphatase 67 38 - 126 U/L   Total Bilirubin 0.4  0.3 - 1.2 mg/dL   GFR calc non Af Amer >60 >60 mL/min   GFR calc Af Amer >60 >60 mL/min    Comment: (NOTE) The eGFR has been calculated using the CKD EPI equation. This calculation has not been validated in all clinical situations. eGFR's persistently <60 mL/min signify possible Chronic Kidney Disease.    Anion gap 9 5 - 15    Comment: Performed at [facility], [place] [address], [place], [place] [address]  Ethanol/ETOH     Status: None   Collection Time: 04/22/18  4:22 PM  Result Value Ref Range   Alcohol, Ethyl (B) <10 <10 mg/dL    Comment: (NOTE) Lowest detectable limit for serum alcohol is 10 mg/dL. For medical purposes only. Performed at [facility], [place] [address], [place], [place] [address]   Magnesium     Status: None   Collection Time: 04/22/18  4:22 PM  Result Value Ref Range   Magnesium 2.1 1.7 - 2.4 mg/dL    Comment: Performed at [facility], [place] [address], [place],  [address]  CBG monitoring, ED     Status: None   Collection Time: 04/22/18  5:20 PM  Result Value Ref Range   Glucose-Capillary 91 70 - 99 mg/dL  CBC WITH DIFFERENTIAL     Status: None   Collection Time: 04/23/18  8:00 AM  Result Value Ref Range   WBC 5.0 4.0 - 10.5 K/uL   RBC 4.70 3.87 - 5.11 MIL/uL   Hemoglobin 13.9 12.0 - 15.0 g/dL   HCT 43.2 36.0 - 46.0 %   MCV 91.9 80.0 - 100.0 fL   MCH 29.6 26.0 - 34.0 pg   MCHC 32.2 30.0 - 36.0 g/dL   RDW 12.0 11.5 - 15.5 %   Platelets 204 150 - 400 K/uL   nRBC 0.0 0.0 - 0.2 %   Neutrophils Relative % 56 %   Neutro Abs 2.8 1.7 - 7.7 K/uL   Lymphocytes Relative 32 %   Lymphs Abs 1.6 0.7 - 4.0 K/uL   Monocytes Relative 7 %   Monocytes Absolute 0.4  0.1 - 1.0 K/uL   Eosinophils Relative 4 %   Eosinophils Absolute 0.2 0.0 - 0.5 K/uL   Basophils Relative 1 %   Basophils Absolute 0.1 0.0 - 0.1 K/uL   Immature Granulocytes 0 %   Abs Immature Granulocytes 0.02 0.00 - 0.07 K/uL    Comment:  Performed at [facility], [place] [address], [place], [place] [address]  Basic metabolic panel     Status: Abnormal   Collection Time: 04/23/18  8:00 AM  Result Value Ref Range   Sodium 141 135 - 145 mmol/L   Potassium 3.6 3.5 - 5.1 mmol/L   Chloride 108 98 - 111 mmol/L   CO2 23 22 - 32 mmol/L   Glucose, Bld 109 (H) 70 - 99 mg/dL   BUN 11 6 - 20 mg/dL   Creatinine, Ser 0.74 0.44 - 1.00 mg/dL   Calcium 8.9 8.9 - 10.3 mg/dL   GFR calc non Af Amer >60 >60 mL/min   GFR calc Af Amer >60 >60 mL/min    Comment: (NOTE) The eGFR has been calculated using the CKD EPI equation. This calculation has not been validated in all clinical situations. eGFR's persistently <60 mL/min signify possible Chronic Kidney Disease.    Anion gap 10 5 - 15    Comment: Performed at [facility], [place] [address], [place], [place] [address]  CBG monitoring, ED     Status: None   Collection Time: 04/23/18  8:46 AM  Result Value Ref Range   Glucose-Capillary 91 70 - 99 mg/dL  Urine rapid drug screen (hosp performed)     Status: Abnormal   Collection Time: 04/23/18  8:57 AM  Result Value Ref Range   Opiates NONE DETECTED NONE DETECTED   Cocaine NONE DETECTED NONE DETECTED   Benzodiazepines POSITIVE (A) NONE DETECTED   Amphetamines NONE DETECTED NONE DETECTED   Tetrahydrocannabinol NONE DETECTED NONE DETECTED   Barbiturates NONE DETECTED NONE DETECTED    Comment: (NOTE) DRUG SCREEN FOR MEDICAL PURPOSES ONLY.  IF CONFIRMATION IS NEEDED FOR ANY PURPOSE, NOTIFY LAB WITHIN 5 DAYS. LOWEST DETECTABLE LIMITS FOR URINE DRUG SCREEN Drug Class                     Cutoff (ng/mL) Amphetamine and metabolites    1000 Barbiturate and metabolites    200 Benzodiazepine                 329 Tricyclics and metabolites     300 Opiates and metabolites        300 Cocaine and metabolites        300 THC                            50 Performed at [facility], [place] [address] [place], [place] [address]   CBC     Status: None   Collection Time: 04/24/18  4:14 AM  Result Value Ref Range   WBC 5.2 4.0 - 10.5 K/uL   RBC 4.77 3.87 - 5.11 MIL/uL   Hemoglobin 14.2 12.0 - 15.0 g/dL   HCT 43.3 36.0 - 46.0 %   MCV 90.8 80.0 - 100.0 fL   MCH 29.8 26.0 - 34.0 pg   MCHC 32.8 30.0 - 36.0 g/dL   RDW 11.9 11.5 - 15.5 %   Platelets 206 150 - 400 K/uL   nRBC 0.0 0.0 - 0.2 %  Comment: Performed at [facility], [place] [address] [place], [place] [address]  Comprehensive metabolic panel     Status: Abnormal   Collection Time: 04/24/18  4:14 AM  Result Value Ref Range   Sodium 143 135 - 145 mmol/L   Potassium 3.5 3.5 - 5.1 mmol/L   Chloride 109 98 - 111 mmol/L   CO2 26 22 - 32 mmol/L   Glucose, Bld 110 (H) 70 - 99 mg/dL   BUN 9 6 - 20 mg/dL   Creatinine, Ser 0.72 0.44 - 1.00 mg/dL   Calcium 9.2 8.9 - 10.3 mg/dL   Total Protein 7.1 6.5 - 8.1 g/dL   Albumin 3.8 3.5 - 5.0 g/dL   AST 16 15 - 41 U/L   ALT 13 0 - 44 U/L   Alkaline Phosphatase 67 38 - 126 U/L   Total Bilirubin 0.7 0.3 - 1.2 mg/dL   GFR calc non Af Amer >60 >60 mL/min   GFR calc Af Amer >60 >60 mL/min    Comment: (NOTE) The eGFR has been calculated using the CKD EPI equation. This calculation has not been validated in all clinical situations. eGFR's persistently <60 mL/min signify possible Chronic Kidney Disease.    Anion gap 8 5 - 15    Comment: Performed at [facility], [place] [address] [place], [place] [address]  Magnesium     Status: None   Collection Time: 04/24/18  4:14 AM  Result Value Ref Range   Magnesium 2.1 1.7 - 2.4 mg/dL    Comment: Performed at [facility], [place] [address], [place], [place] [address]   Ct Head W & Wo Contrast  Result Date: 04/22/2018 CLINICAL DATA:  [place]itnessed seizure at church. History of migraine, seizures and hyperlipidemia. EXAM: CT HEAD WITHOUT AND WITH CONTRAST TECHNIQUE: Contiguous axial images were  obtained from the base of the skull through the vertex without and with intravenous contrast CONTRAST:  30m OMNIPAQUE IOHEXOL 300 MG/ML  SOLN COMPARISON:  CT HEAD March 21, 2012 FINDINGS: BRAIN: No intraparenchymal hemorrhage, mass effect nor midline shift. The ventricles and sulci are normal. No acute large vascular territory infarcts. No abnormal extra-axial fluid collections. No abnormal intraparenchymal or extra-axial enhancement. Basal cisterns are patent. VASCULAR: Unremarkable. SKULL/SOFT TISSUES: No skull fracture. Empty sella. No significant soft tissue swelling. ORBITS/SINUSES: The included ocular globes and orbital contents are normal.Trace paranasal sinus mucosal thickening. Mastoid air cells are well aerated. OTHER: None. IMPRESSION: 1. No acute intracranial process. 2. Empty sella. Otherwise unremarkable CT HEAD with and without contrast. Electronically Signed   By: CElon AlasM.D.   On: 04/22/2018 19:21   Mr BJeri CosWQVContrast  Result Date: 04/23/2018 CLINICAL DATA:  52year old female with new onset seizure activity. First seizure at church yesterday. EXAM: MRI HEAD WITHOUT AND WITH CONTRAST TECHNIQUE: Multiplanar, multiecho pulse sequences of the brain and surrounding structures were obtained without and with intravenous contrast. CONTRAST:  10 milliliters Gadavist COMPARISON:  head CTs 04/22/2018 and earlier FINDINGS: Brain: Partially empty sella. Cerebral volume is within normal limits for age. No restricted diffusion to suggest acute infarction. No midline shift, mass effect, evidence of mass lesion, ventriculomegaly, extra-axial collection or acute intracranial hemorrhage. Cervicomedullary junction within normal limits. GPearline Cablesand white matter signal is within normal limits for age throughout the brain. No cortical encephalomalacia or chronic cerebral blood products. On thin slice T2 coronal images the hippocampal formations appear symmetric and within normal limits. The deep gray  matter nuclei,  brainstem, and cerebellum appear normal. No abnormal enhancement identified. No dural thickening Vascular: Major intracranial vascular flow voids are preserved with mild intracranial artery tortuosity. The major dural venous sinuses are enhancing and appear patent. Skull and upper cervical spine: Negative visible cervical spine. Visualized bone marrow signal is within normal limits. Sinuses/Orbits: Negative. Other: There is a small volume of petrous apex fluid greater on the left, significance is doubtful. The mastoids are clear. Other internal auditory structures appear normal. Scalp and face soft tissues appear negative. IMPRESSION: 1. Normal MRI appearance of the brain aside from partially empty sella, which can be a normal variant or associated with idiopathic intracranial hypertension (pseudotumor cerebri). 2. Mild bilateral petrous air cell fluid is nonspecific but appears inconsequential. Electronically Signed   By: Genevie Ann M.D.   On: 04/23/2018 07:19    Pending Labs Unresulted Labs (From admission, onward)    Start     Ordered   04/29/18 0500  Creatinine, serum  (enoxaparin (LOVENOX)    CrCl >/= 30 ml/min)  Weekly,   R    Comments:  while on enoxaparin therapy    04/22/18 2026   04/22/18 2025  HIV antibody (Routine Testing)  Once,   R     04/22/18 2026          Vitals/Pain Today's Vitals   04/24/18 0600 04/24/18 0700 04/24/18 0757 04/24/18 0800  BP: (!) 133/91 133/88  118/89  Pulse: 72 68  75  Resp: 19 (!) 9  16  Temp:      TempSrc:      SpO2: 95% 97%  98%  PainSc:   Asleep     Isolation Precautions No active isolations  Medications Medications  sodium chloride (PF) 0.9 % injection (has no administration in time range)  pantoprazole (PROTONIX) EC tablet 40 mg (40 mg Oral Given 04/23/18 1032)  famotidine (PEPCID) tablet 40 mg (40 mg Oral Given 04/23/18 2158)  albuterol (PROVENTIL) (2.5 MG/3ML) 0.083% nebulizer solution 2.5 mg (has no administration in time  range)  mometasone-formoterol (DULERA) 200-5 MCG/ACT inhaler 2 puff (2 puffs Inhalation Given 04/23/18 2157)  montelukast (SINGULAIR) tablet 10 mg (10 mg Oral Given 04/24/18 0412)  brimonidine (ALPHAGAN) 0.15 % ophthalmic solution 1 drop (1 drop Both Eyes Given 04/23/18 2157)  latanoprost (XALATAN) 0.005 % ophthalmic solution 1 drop (1 drop Both Eyes Given 04/23/18 2157)  enoxaparin (LOVENOX) injection 40 mg (40 mg Subcutaneous Given 04/23/18 2158)  sodium chloride flush (NS) 0.9 % injection 3 mL (3 mLs Intravenous Given 04/23/18 2203)  0.9 %  sodium chloride infusion ( Intravenous Stopped 04/24/18 0418)  potassium chloride 10 mEq in 100 mL IVPB (10 mEq Intravenous Not Given 04/23/18 1811)  acetaminophen (TYLENOL) tablet 650 mg (650 mg Oral Given 04/24/18 0421)    Or  acetaminophen (TYLENOL) suppository 650 mg ( Rectal See Alternative 04/24/18 0421)  ondansetron (ZOFRAN) tablet 4 mg (has no administration in time range)    Or  ondansetron (ZOFRAN) injection 4 mg (has no administration in time range)  levETIRAcetam (KEPPRA) 750 mg in sodium chloride 0.9 % 100 mL IVPB (0 mg Intravenous Stopped 04/24/18 0646)  ammonia inhalant 0.3 mL (has no administration in time range)  LORazepam (ATIVAN) injection 1 mg (has no administration in time range)  LORazepam (ATIVAN) 2 MG/ML injection (2 mg  Given 04/22/18 1630)  LORazepam (ATIVAN) 2 MG/ML injection (2 mg  Given 04/22/18 1630)  LORazepam (ATIVAN) injection 2 mg (2 mg Intravenous Given 04/22/18 1724)  levETIRAcetam (KEPPRA) IVPB  1000 mg/100 mL premix (0 mg Intravenous Stopped 04/22/18 1815)  iohexol (OMNIPAQUE) 300 MG/ML solution 75 mL (75 mLs Intravenous Contrast Given 04/22/18 1821)  potassium chloride 10 mEq in 100 mL IVPB (0 mEq Intravenous Stopped 04/23/18 0739)  gadobutrol (GADAVIST) 1 MMOL/ML injection 10 mL (10 mLs Intravenous Contrast Given 04/23/18 0644)  potassium chloride SA (K-DUR,KLOR-CON) CR tablet 40 mEq (40 mEq Oral Given 04/23/18  1031)  levETIRAcetam (KEPPRA) tablet 500 mg (500 mg Oral Given 04/23/18 1639)  LORazepam (ATIVAN) injection 1 mg (1 mg Intravenous Given 04/23/18 1724)    Mobility walks with person assist

## 2018-04-24 NOTE — ED Notes (Signed)
Neuro provider at bedside  

## 2018-04-24 NOTE — ED Notes (Signed)
ED TO INPATIENT HANDOFF REPORT  Name/Age/Gender Megan Wilkerson 52 y.o. female  Code Status    Code Status Orders  (From admission, onward)         Start     Ordered   04/22/18 2025  Full code  Continuous     04/22/18 2026        Code Status History    Date Active Date Inactive Code Status Order ID Comments User Context   03/10/2012 1709 03/10/2012 2337 Full Code [id number]  Orlie Dakin, MD ED   04/21/2011 1053 04/23/2011 1247 Full Code [id number]  Sheldon Silvan, RN Inpatient      Home/SNF/Other Home  Chief Complaint Seizure   Level of Care/Admitting Diagnosis ED Disposition    ED Disposition Condition [place]: [place] [[id number]]  Level of Care: Telemetry [5]  Diagnosis: Seizures [facility]) [[id number]]  Admitting Physician: Elodia Florence [phone]  Attending Physician: Cephus Slater, A CALDWELL [phone]  Estimated length of stay: past midnight tomorrow  Certification:: I certify this patient will need inpatient services for at least 2 midnights  PT Class (Do Not Modify): Inpatient [101]  PT Acc Code (Do Not Modify): Private [1]       Medical History Past Medical History:  Diagnosis Date  . Anaphylactic reaction    several ED visits for same since 01/2012  . Asthma   . GERD   . Hives of unknown origin    chronic and recurrent urticaria since 01/2012  . HYPERLIPIDEMIA, WITH LOW HDL   . MIGRAINE HEADACHE   . PERIPHERAL NEUROPATHY   . POSITIVE PPD 1987   s/p 52motx  . Seizures (HCC)     Allergies Allergies  Allergen Reactions  . Milk-Related Compounds     Mouth itching, dizziness, coughing, wheezing and SOB  . Other Anaphylaxis    JMarolyn Hammockrancher gummies and paprika   . Peanuts [Peanut Oil] Anaphylaxis and Itching  . Corn Dextrin Other (See Comments)    Plain corn ok, popcorn causes hives  . Gluten Meal Other (See Comments)    cough  . Shellfish Allergy Itching  . Soy Allergy Other (See Comments)   coughing  . Sunflower Oil Itching  . Tomato Itching  . Wheat Bran     swelling  . Morphine Itching and Rash    IV Location/Drains/Wounds Patient Lines/Drains/Airways Status   Active Line/Drains/Airways    Name:   Placement date:   Placement time:   Site:   Days:   Peripheral IV 04/22/18 Right Antecubital   04/22/18    1632    Antecubital   2          Labs/Imaging Results for orders placed or performed during the hospital encounter of 04/22/18 (from the past 48 hour(s))  CBC WITH DIFFERENTIAL     Status: None   Collection Time: 04/22/18  4:21 PM  Result Value Ref Range   WBC 6.8 4.0 - 10.5 K/uL   RBC 4.89 3.87 - 5.11 MIL/uL   Hemoglobin 14.4 12.0 - 15.0 g/dL   HCT 43.6 36.0 - 46.0 %   MCV 89.2 80.0 - 100.0 fL   MCH 29.4 26.0 - 34.0 pg   MCHC 33.0 30.0 - 36.0 g/dL   RDW 12.3 11.5 - 15.5 %   Platelets 218 150 - 400 K/uL   nRBC 0.0 0.0 - 0.2 %   Neutrophils Relative % 61 %   Neutro Abs 4.2 1.7 - 7.7  K/uL   Lymphocytes Relative 29 %   Lymphs Abs 2.0 0.7 - 4.0 K/uL   Monocytes Relative 6 %   Monocytes Absolute 0.4 0.1 - 1.0 K/uL   Eosinophils Relative 2 %   Eosinophils Absolute 0.1 0.0 - 0.5 K/uL   Basophils Relative 1 %   Basophils Absolute 0.1 0.0 - 0.1 K/uL   Immature Granulocytes 1 %   Abs Immature Granulocytes 0.04 0.00 - 0.07 K/uL    Comment: Performed at [facility], [place] [address] [place], [place] [address]  Comprehensive metabolic panel     Status: Abnormal   Collection Time: 04/22/18  4:21 PM  Result Value Ref Range   Sodium 143 135 - 145 mmol/L   Potassium 3.3 (L) 3.5 - 5.1 mmol/L   Chloride 108 98 - 111 mmol/L   CO2 26 22 - 32 mmol/L   Glucose, Bld 133 (H) 70 - 99 mg/dL   BUN 15 6 - 20 mg/dL   Creatinine, Ser 1.05 (H) 0.44 - 1.00 mg/dL   Calcium 9.2 8.9 - 10.3 mg/dL   Total Protein 7.6 6.5 - 8.1 g/dL   Albumin 4.2 3.5 - 5.0 g/dL   AST 18 15 - 41 U/L   ALT 13 0 - 44 U/L   Alkaline Phosphatase 67 38 - 126 U/L   Total Bilirubin 0.4  0.3 - 1.2 mg/dL   GFR calc non Af Amer >60 >60 mL/min   GFR calc Af Amer >60 >60 mL/min    Comment: (NOTE) The eGFR has been calculated using the CKD EPI equation. This calculation has not been validated in all clinical situations. eGFR's persistently <60 mL/min signify possible Chronic Kidney Disease.    Anion gap 9 5 - 15    Comment: Performed at [facility], [place] [address] [place], [place] [address]  Ethanol/ETOH     Status: None   Collection Time: 04/22/18  4:22 PM  Result Value Ref Range   Alcohol, Ethyl (B) <10 <10 mg/dL    Comment: (NOTE) Lowest detectable limit for serum alcohol is 10 mg/dL. For medical purposes only. Performed at [facility], [place] [address], [place], [place] [address]   Magnesium     Status: None   Collection Time: 04/22/18  4:22 PM  Result Value Ref Range   Magnesium 2.1 1.7 - 2.4 mg/dL    Comment: Performed at [facility], [place] [address] [place],  [address]  CBG monitoring, ED     Status: None   Collection Time: 04/22/18  5:20 PM  Result Value Ref Range   Glucose-Capillary 91 70 - 99 mg/dL  CBC WITH DIFFERENTIAL     Status: None   Collection Time: 04/23/18  8:00 AM  Result Value Ref Range   WBC 5.0 4.0 - 10.5 K/uL   RBC 4.70 3.87 - 5.11 MIL/uL   Hemoglobin 13.9 12.0 - 15.0 g/dL   HCT 43.2 36.0 - 46.0 %   MCV 91.9 80.0 - 100.0 fL   MCH 29.6 26.0 - 34.0 pg   MCHC 32.2 30.0 - 36.0 g/dL   RDW 12.0 11.5 - 15.5 %   Platelets 204 150 - 400 K/uL   nRBC 0.0 0.0 - 0.2 %   Neutrophils Relative % 56 %   Neutro Abs 2.8 1.7 - 7.7 K/uL   Lymphocytes Relative 32 %   Lymphs Abs 1.6 0.7 - 4.0 K/uL   Monocytes Relative 7 %   Monocytes Absolute 0.4  0.1 - 1.0 K/uL   Eosinophils Relative 4 %   Eosinophils Absolute 0.2 0.0 - 0.5 K/uL   Basophils Relative 1 %   Basophils Absolute 0.1 0.0 - 0.1 K/uL   Immature Granulocytes 0 %   Abs Immature Granulocytes 0.02 0.00 - 0.07 K/uL    Comment:  Performed at [facility], [place] [address] [place], [place] [address]  Basic metabolic panel     Status: Abnormal   Collection Time: 04/23/18  8:00 AM  Result Value Ref Range   Sodium 141 135 - 145 mmol/L   Potassium 3.6 3.5 - 5.1 mmol/L   Chloride 108 98 - 111 mmol/L   CO2 23 22 - 32 mmol/L   Glucose, Bld 109 (H) 70 - 99 mg/dL   BUN 11 6 - 20 mg/dL   Creatinine, Ser 0.74 0.44 - 1.00 mg/dL   Calcium 8.9 8.9 - 10.3 mg/dL   GFR calc non Af Amer >60 >60 mL/min   GFR calc Af Amer >60 >60 mL/min    Comment: (NOTE) The eGFR has been calculated using the CKD EPI equation. This calculation has not been validated in all clinical situations. eGFR's persistently <60 mL/min signify possible Chronic Kidney Disease.    Anion gap 10 5 - 15    Comment: Performed at [facility], [place] [address] [place], [place] [address]  CBG monitoring, ED     Status: None   Collection Time: 04/23/18  8:46 AM  Result Value Ref Range   Glucose-Capillary 91 70 - 99 mg/dL  Urine rapid drug screen (hosp performed)     Status: Abnormal   Collection Time: 04/23/18  8:57 AM  Result Value Ref Range   Opiates NONE DETECTED NONE DETECTED   Cocaine NONE DETECTED NONE DETECTED   Benzodiazepines POSITIVE (A) NONE DETECTED   Amphetamines NONE DETECTED NONE DETECTED   Tetrahydrocannabinol NONE DETECTED NONE DETECTED   Barbiturates NONE DETECTED NONE DETECTED    Comment: (NOTE) DRUG SCREEN FOR MEDICAL PURPOSES ONLY.  IF CONFIRMATION IS NEEDED FOR ANY PURPOSE, NOTIFY LAB WITHIN 5 DAYS. LOWEST DETECTABLE LIMITS FOR URINE DRUG SCREEN Drug Class                     Cutoff (ng/mL) Amphetamine and metabolites    1000 Barbiturate and metabolites    200 Benzodiazepine                 127 Tricyclics and metabolites     300 Opiates and metabolites        300 Cocaine and metabolites        300 THC                            50 Performed at [facility], [place] [address] [place], [place] [address]   CBC     Status: None   Collection Time: 04/24/18  4:14 AM  Result Value Ref Range   WBC 5.2 4.0 - 10.5 K/uL   RBC 4.77 3.87 - 5.11 MIL/uL   Hemoglobin 14.2 12.0 - 15.0 g/dL   HCT 43.3 36.0 - 46.0 %   MCV 90.8 80.0 - 100.0 fL   MCH 29.8 26.0 - 34.0 pg   MCHC 32.8 30.0 - 36.0 g/dL   RDW 11.9 11.5 - 15.5 %   Platelets 206 150 - 400 K/uL   nRBC 0.0 0.0 - 0.2 %  Comment: Performed at [facility], [place] [address] [place], [place] [address]  Comprehensive metabolic panel     Status: Abnormal   Collection Time: 04/24/18  4:14 AM  Result Value Ref Range   Sodium 143 135 - 145 mmol/L   Potassium 3.5 3.5 - 5.1 mmol/L   Chloride 109 98 - 111 mmol/L   CO2 26 22 - 32 mmol/L   Glucose, Bld 110 (H) 70 - 99 mg/dL   BUN 9 6 - 20 mg/dL   Creatinine, Ser 0.72 0.44 - 1.00 mg/dL   Calcium 9.2 8.9 - 10.3 mg/dL   Total Protein 7.1 6.5 - 8.1 g/dL   Albumin 3.8 3.5 - 5.0 g/dL   AST 16 15 - 41 U/L   ALT 13 0 - 44 U/L   Alkaline Phosphatase 67 38 - 126 U/L   Total Bilirubin 0.7 0.3 - 1.2 mg/dL   GFR calc non Af Amer >60 >60 mL/min   GFR calc Af Amer >60 >60 mL/min    Comment: (NOTE) The eGFR has been calculated using the CKD EPI equation. This calculation has not been validated in all clinical situations. eGFR's persistently <60 mL/min signify possible Chronic Kidney Disease.    Anion gap 8 5 - 15    Comment: Performed at [facility], [place] [address] [place], [place] [address]  Magnesium     Status: None   Collection Time: 04/24/18  4:14 AM  Result Value Ref Range   Magnesium 2.1 1.7 - 2.4 mg/dL    Comment: Performed at [facility], [place] [address] [place], [place] [address]   Ct Head W & Wo Contrast  Result Date: 04/22/2018 CLINICAL DATA:  Witnessed seizure at church. History of migraine, seizures and hyperlipidemia. EXAM: CT HEAD WITHOUT AND WITH CONTRAST TECHNIQUE: Contiguous axial images were  obtained from the base of the skull through the vertex without and with intravenous contrast CONTRAST:  2m OMNIPAQUE IOHEXOL 300 MG/ML  SOLN COMPARISON:  CT HEAD March 21, 2012 FINDINGS: BRAIN: No intraparenchymal hemorrhage, mass effect nor midline shift. The ventricles and sulci are normal. No acute large vascular territory infarcts. No abnormal extra-axial fluid collections. No abnormal intraparenchymal or extra-axial enhancement. Basal cisterns are patent. VASCULAR: Unremarkable. SKULL/SOFT TISSUES: No skull fracture. Empty sella. No significant soft tissue swelling. ORBITS/SINUSES: The included ocular globes and orbital contents are normal.Trace paranasal sinus mucosal thickening. Mastoid air cells are well aerated. OTHER: None. IMPRESSION: 1. No acute intracranial process. 2. Empty sella. Otherwise unremarkable CT HEAD with and without contrast. Electronically Signed   By: CElon AlasM.D.   On: 04/22/2018 19:21   Mr BJeri CosWWNContrast  Result Date: 04/23/2018 CLINICAL DATA:  52year old female with new onset seizure activity. First seizure at church yesterday. EXAM: MRI HEAD WITHOUT AND WITH CONTRAST TECHNIQUE: Multiplanar, multiecho pulse sequences of the brain and surrounding structures were obtained without and with intravenous contrast. CONTRAST:  10 milliliters Gadavist COMPARISON:  head CTs 04/22/2018 and earlier FINDINGS: Brain: Partially empty sella. Cerebral volume is within normal limits for age. No restricted diffusion to suggest acute infarction. No midline shift, mass effect, evidence of mass lesion, ventriculomegaly, extra-axial collection or acute intracranial hemorrhage. Cervicomedullary junction within normal limits. GPearline Cablesand white matter signal is within normal limits for age throughout the brain. No cortical encephalomalacia or chronic cerebral blood products. On thin slice T2 coronal images the hippocampal formations appear symmetric and within normal limits. The deep gray  matter nuclei,  brainstem, and cerebellum appear normal. No abnormal enhancement identified. No dural thickening Vascular: Major intracranial vascular flow voids are preserved with mild intracranial artery tortuosity. The major dural venous sinuses are enhancing and appear patent. Skull and upper cervical spine: Negative visible cervical spine. Visualized bone marrow signal is within normal limits. Sinuses/Orbits: Negative. Other: There is a small volume of petrous apex fluid greater on the left, significance is doubtful. The mastoids are clear. Other internal auditory structures appear normal. Scalp and face soft tissues appear negative. IMPRESSION: 1. Normal MRI appearance of the brain aside from partially empty sella, which can be a normal variant or associated with idiopathic intracranial hypertension (pseudotumor cerebri). 2. Mild bilateral petrous air cell fluid is nonspecific but appears inconsequential. Electronically Signed   By: Genevie Ann M.D.   On: 04/23/2018 07:19   None  Pending Labs Unresulted Labs (From admission, onward)    Start     Ordered   04/29/18 0500  Creatinine, serum  (enoxaparin (LOVENOX)    CrCl >/= 30 ml/min)  Weekly,   R    Comments:  while on enoxaparin therapy    04/22/18 2026   04/22/18 2025  HIV antibody (Routine Testing)  Once,   R     04/22/18 2026          Vitals/Pain Today's Vitals   04/24/18 0600 04/24/18 0700 04/24/18 0757 04/24/18 0800  BP: (!) 133/91 133/88  118/89  Pulse: 72 68  75  Resp: 19 (!) 9  16  Temp:      TempSrc:      SpO2: 95% 97%  98%  PainSc:   Asleep     Isolation Precautions No active isolations  Medications Medications  sodium chloride (PF) 0.9 % injection (has no administration in time range)  pantoprazole (PROTONIX) EC tablet 40 mg (40 mg Oral Given 04/24/18 1028)  famotidine (PEPCID) tablet 40 mg (40 mg Oral Given 04/23/18 2158)  albuterol (PROVENTIL) (2.5 MG/3ML) 0.083% nebulizer solution 2.5 mg (has no administration in  time range)  mometasone-formoterol (DULERA) 200-5 MCG/ACT inhaler 2 puff (2 puffs Inhalation Given 04/24/18 1028)  montelukast (SINGULAIR) tablet 10 mg (10 mg Oral Given 04/24/18 0412)  brimonidine (ALPHAGAN) 0.15 % ophthalmic solution 1 drop (1 drop Both Eyes Given 04/24/18 1029)  latanoprost (XALATAN) 0.005 % ophthalmic solution 1 drop (1 drop Both Eyes Given 04/23/18 2157)  enoxaparin (LOVENOX) injection 40 mg (40 mg Subcutaneous Given 04/23/18 2158)  sodium chloride flush (NS) 0.9 % injection 3 mL (3 mLs Intravenous Given 04/24/18 1030)  0.9 %  sodium chloride infusion ( Intravenous Stopped 04/24/18 0418)  potassium chloride 10 mEq in 100 mL IVPB (10 mEq Intravenous Not Given 04/23/18 1811)  acetaminophen (TYLENOL) tablet 650 mg (650 mg Oral Given 04/24/18 0421)    Or  acetaminophen (TYLENOL) suppository 650 mg ( Rectal See Alternative 04/24/18 0421)  ondansetron (ZOFRAN) tablet 4 mg (has no administration in time range)    Or  ondansetron (ZOFRAN) injection 4 mg (has no administration in time range)  levETIRAcetam (KEPPRA) 750 mg in sodium chloride 0.9 % 100 mL IVPB (0 mg Intravenous Stopped 04/24/18 0646)  ammonia inhalant 0.3 mL (has no administration in time range)  LORazepam (ATIVAN) injection 1 mg (has no administration in time range)  LORazepam (ATIVAN) 2 MG/ML injection (2 mg  Given 04/22/18 1630)  LORazepam (ATIVAN) 2 MG/ML injection (2 mg  Given 04/22/18 1630)  LORazepam (ATIVAN) injection 2 mg (2 mg Intravenous Given 04/22/18 1724)  levETIRAcetam (KEPPRA)  IVPB 1000 mg/100 mL premix (0 mg Intravenous Stopped 04/22/18 1815)  iohexol (OMNIPAQUE) 300 MG/ML solution 75 mL (75 mLs Intravenous Contrast Given 04/22/18 1821)  potassium chloride 10 mEq in 100 mL IVPB (0 mEq Intravenous Stopped 04/23/18 0739)  gadobutrol (GADAVIST) 1 MMOL/ML injection 10 mL (10 mLs Intravenous Contrast Given 04/23/18 0644)  potassium chloride SA (K-DUR,KLOR-CON) CR tablet 40 mEq (40 mEq Oral Given  04/23/18 1031)  levETIRAcetam (KEPPRA) tablet 500 mg (500 mg Oral Given 04/23/18 1639)  LORazepam (ATIVAN) injection 1 mg (1 mg Intravenous Given 04/23/18 1724)    Mobility walks with device

## 2018-04-24 NOTE — Progress Notes (Signed)
   I was notified by staff on the floor that patient was having possible seizures----upon arriving the room patient sitting on the commode unresponsive with her tongue sticking out  Patient has EEG leads on her scalp  Her sister is standing bye, episode of unresponsiveness and jerking movement lasted about 6 minutes,  O2 was placed and patient received 1 mg of Ativan 1 mg iv x 1..... Jerking movement ceased  Patient is currently resting comfortably vital signs appear stable  Notified attending physician and on-call neurologist   Staff will continue to monitor possible seizure type activity  Shon Haleourage Nikolas Casher, MD

## 2018-04-24 NOTE — Progress Notes (Signed)
Patient refused CPAP HS tonight. Patient in no distress.

## 2018-04-24 NOTE — ED Notes (Signed)
Pt is resting at this time, visitor is at bedside.

## 2018-04-24 NOTE — ED Notes (Signed)
Bed: [id number] Expected date:  Expected time:  Means of arrival:  Comments: Hold for 25

## 2018-04-24 NOTE — Progress Notes (Signed)
Patient had witness seizure, captured on EEG, seizure lasted from 1420 til 1426, 1MG  ativan given IV per MD courage, Hospitalitis notified. Seizure precautions were in place, patient has no injury's and is resting comfortably in bed speaking occasionally with family, vitals taken

## 2018-04-24 NOTE — Progress Notes (Signed)
EEG completed; results pending.    

## 2018-04-24 NOTE — Progress Notes (Signed)
PROGRESS NOTE    Megan Wilkerson  YQM:[id number] DOB: 07-16-1965 DOA: 04/22/2018 PCP: Myrlene Broker, MD  Brief Narrative:  Megan Wilkerson is a 52 y.o. female with medical history significant for asthma and allergies with anaphylactic reactions, now presenting to the emergency department after a series of seizures.  Patient does not have history of seizures.  She is accompanied by her husband and sister who assist with the history.  Patient was reportedly in her usual state of health this morning, went to church with her sister, and was noted to have generalized shaking with loss of consciousness.  This lasted for at least a couple minutes per report of the patient's sister, but soon after she stopped shaking, it started again.  EMS was called and brought the patient into the ED.  There has not been any recent fall, trauma, use of alcohol or illicit substances, use of benzodiazepines, or any complaints from the patient. She has a sister with seizure disorder.   Assessment & Plan:   Principal Problem:   Seizures (HCC) Active Problems:   Asthma   Hypokalemia   Obstructive sleep apnea   1. Seizures  - No prior history, but now has had several episodes concerning for seizures - Initially planning to discharge today based on neuro recs from 11/25 AM, but she had another episode of generalized shaking ~1700 on 11/25 lasting about 3 minutes followed by 4-5 episodes where she was described as "tensing" up (per discussion with EDP, appreciate Dr. Christoper Fabian assistance during these episodes, one of these episodes she was able to stop with sternal rub).  She was given two 1 mg doses of ativan.  She was notably noted to vomit during the first episode of generalized shaking (nurse present at that time and pt was on her side and did not aspirate - monitor closely).  - 11/26 sounds like she had an episode lasting a few minutes requiring ativan x 1 (but no note recorded) - Will increase keppra to 750,  now planning to transfer to [facility] for prolonged monitoring (discussed with neuro, appreciate Dr. Amada Jupiter)  - There is no hx of EtOH or illicit substance use, fall or trauma, or prior seizures  - Head CT with no acute findings - MRI notable for partially empty sella (normal variant or assoicated with pseudotumor cerebri) as well as bilateral petrous air cell fluid  - EEG read pending, but normal per discussion with Dr. Amada Jupiter  - Neurology is consulting appreciate recs, planning for transfer to [facility] as noted above - Continue seizure precautions, neuro checks    2. Asthma  - No wheezing, cough, or dyspnea on admission  - Continue ICS/LABA, Singulair, and as-needed albuterol    3. Hypokalemia  - improved, continue to monitor  DVT prophylaxis: lovenox Code Status: full  Family Communication: husband, sister Disposition Plan: inpatient given recurrent seizures requiring further inpatient neurology evaluation and extended monitoring   Consultants:   neurology  Procedures:   EEG (formal read pending)  Antimicrobials:  Anti-infectives (From admission, onward)   None     Subjective: Had episode this AM. Doing ok now.  Objective: Vitals:   04/24/18 0445 04/24/18 0500 04/24/18 0600 04/24/18 0700  BP:  128/85 (!) 133/91 133/88  Pulse: 96 74 72 68  Resp:  15 19 (!) 9  Temp:      TempSrc:      SpO2: 98% 95% 95% 97%    Intake/Output Summary (Last 24 hours) at 04/24/2018 5284 Last data filed  at 04/24/2018 0646 Gross per 24 hour  Intake 1002.71 ml  Output -  Net 1002.71 ml   There were no vitals filed for this visit.  Examination:  General: No acute distress. Cardiovascular: Heart sounds show a regular rate, and rhythm Lungs: Clear to auscultation bilaterally Abdomen: Soft, nontender, nondistended Neurological: Alert and oriented 3. Moves all extremities 4. Cranial nerves II through XII grossly intact. Skin: Warm and dry. No rashes or lesions. Extremities:  No clubbing or cyanosis. No edema.  Psychiatric: Mood and affect are normal. Insight and judgment are appropriate.    Data Reviewed: I have personally reviewed following labs and imaging studies  CBC: Recent Labs  Lab 04/22/18 1621 04/23/18 0800 04/24/18 0414  WBC 6.8 5.0 5.2  NEUTROABS 4.2 2.8  --   HGB 14.4 13.9 14.2  HCT 43.6 43.2 43.3  MCV 89.2 91.9 90.8  PLT 218 204 206   Basic Metabolic Panel: Recent Labs  Lab 04/22/18 1621 04/22/18 1622 04/23/18 0800 04/24/18 0414  NA 143  --  141 143  K 3.3*  --  3.6 3.5  CL 108  --  108 109  CO2 26  --  23 26  GLUCOSE 133*  --  109* 110*  BUN 15  --  11 9  CREATININE 1.05*  --  0.74 0.72  CALCIUM 9.2  --  8.9 9.2  MG  --  2.1  --  2.1   GFR: CrCl cannot be calculated (Unknown ideal weight.). Liver Function Tests: Recent Labs  Lab 04/22/18 1621 04/24/18 0414  AST 18 16  ALT 13 13  ALKPHOS 67 67  BILITOT 0.4 0.7  PROT 7.6 7.1  ALBUMIN 4.2 3.8   No results for input(s): LIPASE, AMYLASE in the last 168 hours. No results for input(s): AMMONIA in the last 168 hours. Coagulation Profile: No results for input(s): INR, PROTIME in the last 168 hours. Cardiac Enzymes: No results for input(s): CKTOTAL, CKMB, CKMBINDEX, TROPONINI in the last 168 hours. BNP (last 3 results) No results for input(s): PROBNP in the last 8760 hours. HbA1C: No results for input(s): HGBA1C in the last 72 hours. CBG: Recent Labs  Lab 04/22/18 1720 04/23/18 0846  GLUCAP 91 91   Lipid Profile: No results for input(s): CHOL, HDL, LDLCALC, TRIG, CHOLHDL, LDLDIRECT in the last 72 hours. Thyroid Function Tests: No results for input(s): TSH, T4TOTAL, FREET4, T3FREE, THYROIDAB in the last 72 hours. Anemia Panel: No results for input(s): VITAMINB12, FOLATE, FERRITIN, TIBC, IRON, RETICCTPCT in the last 72 hours. Sepsis Labs: No results for input(s): PROCALCITON, LATICACIDVEN in the last 168 hours.  No results found for this or any previous visit  (from the past 240 hour(s)).       Radiology Studies: Ct Head W & Wo Contrast  Result Date: 04/22/2018 CLINICAL DATA:  Witnessed seizure at church. History of migraine, seizures and hyperlipidemia. EXAM: CT HEAD WITHOUT AND WITH CONTRAST TECHNIQUE: Contiguous axial images were obtained from the base of the skull through the vertex without and with intravenous contrast CONTRAST:  75mL OMNIPAQUE IOHEXOL 300 MG/ML  SOLN COMPARISON:  CT HEAD March 21, 2012 FINDINGS: BRAIN: No intraparenchymal hemorrhage, mass effect nor midline shift. The ventricles and sulci are normal. No acute large vascular territory infarcts. No abnormal extra-axial fluid collections. No abnormal intraparenchymal or extra-axial enhancement. Basal cisterns are patent. VASCULAR: Unremarkable. SKULL/SOFT TISSUES: No skull fracture. Empty sella. No significant soft tissue swelling. ORBITS/SINUSES: The included ocular globes and orbital contents are normal.Trace paranasal sinus mucosal  thickening. Mastoid air cells are well aerated. OTHER: None. IMPRESSION: 1. No acute intracranial process. 2. Empty sella. Otherwise unremarkable CT HEAD with and without contrast. Electronically Signed   By: Awilda Metro M.D.   On: 04/22/2018 19:21   Mr Laqueta Jean IO Contrast  Result Date: 04/23/2018 CLINICAL DATA:  52 year old female with new onset seizure activity. First seizure at church yesterday. EXAM: MRI HEAD WITHOUT AND WITH CONTRAST TECHNIQUE: Multiplanar, multiecho pulse sequences of the brain and surrounding structures were obtained without and with intravenous contrast. CONTRAST:  10 milliliters Gadavist COMPARISON:  head CTs 04/22/2018 and earlier FINDINGS: Brain: Partially empty sella. Cerebral volume is within normal limits for age. No restricted diffusion to suggest acute infarction. No midline shift, mass effect, evidence of mass lesion, ventriculomegaly, extra-axial collection or acute intracranial hemorrhage. Cervicomedullary  junction within normal limits. Wallace Cullens and white matter signal is within normal limits for age throughout the brain. No cortical encephalomalacia or chronic cerebral blood products. On thin slice T2 coronal images the hippocampal formations appear symmetric and within normal limits. The deep gray matter nuclei, brainstem, and cerebellum appear normal. No abnormal enhancement identified. No dural thickening Vascular: Major intracranial vascular flow voids are preserved with mild intracranial artery tortuosity. The major dural venous sinuses are enhancing and appear patent. Skull and upper cervical spine: Negative visible cervical spine. Visualized bone marrow signal is within normal limits. Sinuses/Orbits: Negative. Other: There is a small volume of petrous apex fluid greater on the left, significance is doubtful. The mastoids are clear. Other internal auditory structures appear normal. Scalp and face soft tissues appear negative. IMPRESSION: 1. Normal MRI appearance of the brain aside from partially empty sella, which can be a normal variant or associated with idiopathic intracranial hypertension (pseudotumor cerebri). 2. Mild bilateral petrous air cell fluid is nonspecific but appears inconsequential. Electronically Signed   By: Odessa Fleming M.D.   On: 04/23/2018 07:19        Scheduled Meds: . brimonidine  1 drop Both Eyes BID  . enoxaparin (LOVENOX) injection  40 mg Subcutaneous Q24H  . famotidine  40 mg Oral QHS  . latanoprost  1 drop Both Eyes QHS  . mometasone-formoterol  2 puff Inhalation BID  . montelukast  10 mg Oral QHS  . pantoprazole  40 mg Oral Daily  . sodium chloride flush  3 mL Intravenous Q12H   Continuous Infusions: . levETIRAcetam Stopped (04/24/18 0646)     LOS: 2 days    Time spent: over 30 min    Lacretia Nicks, MD [facility] Pager [phone]  If 7PM-7AM, please contact night-coverage [email or web address] Password [email or web address] 04/24/2018, 8:14 AM

## 2018-04-24 NOTE — ED Notes (Signed)
Pt given breakfast tray

## 2018-04-24 NOTE — ED Notes (Signed)
Pt will be roomed in rm [id number]

## 2018-04-25 MED ORDER — DIPHENHYDRAMINE HCL 25 MG PO CAPS: 25.0000 mg | ORAL_CAPSULE | ORAL | Status: DC | PRN

## 2018-04-25 MED ORDER — DIPHENHYDRAMINE HCL 25 MG PO CAPS
50.0000 mg | ORAL_CAPSULE | Freq: Once | ORAL | Status: AC
  Administered 2018-04-25: 50 mg via ORAL
  Filled 2018-04-25: qty 2

## 2018-04-25 NOTE — Procedures (Signed)
   Megan Wilkerson __________________  Video EEG Monitoring Report     Dates of recording: 04/24/2018 @15 :42 to 04/25/2018 @07 :30    Recording day: 1 (started on 11/26)   Interpreting physician: Megan Phenixan-Andrei Lani Havlik, MD       CPT: [phone]   ICD-10: R56.9          Present History: Continuous VEEG requested to evaluate for seizures.  EEG Details: Continuous video EEG was performed using standard setting per the guidelines of American Clinical Neurophysiology Society (ACNS). A minimum of 21 electrodes were placed on scalp according to the International 10-20 or 10-10 system. Supplemental electrodes were placed as needed. Single EKG electrode was also used to detect cardiac arrhythmia. Recording was performed at a sampling rate of at least 256 Hz. Patient's behavior was continuously recorded on video simultaneously with EEG. A minimum of 18 channels were used for data display. Each epoch of study was reviewed manually daily and as needed using standard digital review software allowing for montage reformatting, gain and filter changes on a display system of sufficient resolution to prevent aliasing. Computerized quantitative EEG analysis (such as compressed spectral array analysis, dipole analysis, trending, automated spike & seizure detection) was used as indicated.  Description of EEG features: State of patient: Awake and drowsy  Background Activity Overall Amplitude:  medium amplitude, symmetric  Predominant Frequency: A posterior dominant rhythm of 10-11 Hz is observed, which is symmetric and reactive to eye closure.  Superimposed Frequencies: intermittent medium amplitude (20-50 V) monomorphic theta and beta rhythms, bilateral and symmetric Reactivity to stimulation: present Asymmetry: No  Breach rhythm: No  Sleep: No sleep is recorded.  Background abnormalities: No Periodic or rhythmic abnormalities: No Epileptiform discharges: No Paroxysmal events or  seizures: No Push button events: Yes PBE #1-4 11/26 @16 :19, 16:21   Clinical: patient is off camera, but staff is commenting that she has been seizing for several minutes  EEG: no EEG correlate - rhythmic movement artifact is present, but the underlying EEG is normal, awake  11/27 @04 :24   Clinical: patient is lying in bed, and she is moaning loudly; someone says "she is having a seizure, she is making a sound". She remains poorly responsive. At ~04:26 she starts having low amplitude, arrhythmic leg movements and pelvic thrusting. These continue, waxing and waning until ~04:33.  EEG: no EEG correlate - normal, awake  11/27 @06 :49  Clinical: the patient is lying in bed, and she starts having arrhythmic, low amplitude shaking in the lower extremities while also moaning; she progresses with low amplitude pelvic thursting. She is poorly responsive. The event lasts until ~06:53.  EEG: no EEG correlate - normal, awake  EKG: 80 bpm and regular  Impression: This is a normal awake and sleep EEG. No epileptiform discharges or lateralizing signs are seen.   The paroxysmal events captured during this recording are not epileptic seizures and are most consistent with psychogenic non-epileptic spells.

## 2018-04-25 NOTE — Progress Notes (Signed)
Patient had seizure like activity lasting 2.5 minutes, Jomarie LongsJoseph MD aware

## 2018-04-25 NOTE — Progress Notes (Signed)
Notified by husband patient was seizing, husband pushed the eeg monitor button, whole body jerking, lasting about 2 and half minutes, pt able to maintain 100% o2 on room air. Able to follow commands and oriented easily. Notified MD on call.

## 2018-04-25 NOTE — Progress Notes (Signed)
Called to room by nurse tech that patient is seizing, husband at beside pushed the EEG monitor button.Patient was unresponsive. At first only arms were jerking, then she stuck her tongue out after about a minute, then whole body was jerking lasting about 8 minutes. Patient quickly became alert, able to follow commands and answers questions appropriately. Notified MD on call.

## 2018-04-25 NOTE — Progress Notes (Signed)
PROGRESS NOTE    [name]  ZOX:[id number] DOB: Jul 02, 1965 DOA: 04/22/2018 PCP: Myrlene Broker, MD  Brief Narrative:  Megan Wilkerson is a 52 y.o. female with medical history significant for asthma and allergies with anaphylactic reactions, now presenting to the emergency department after a series of seizures.  Patient does not have history of seizures.  She is accompanied by her husband and sister who assist with the history.  Patient was reportedly in her usual state of health this morning, went to church with her sister, and was noted to have generalized shaking with loss of consciousness.  This lasted for at least a couple minutes per report of the patient's sister, but soon after she stopped shaking, it started again.  EMS was called and brought the patient into the ED.  There has not been any recent fall, trauma, use of alcohol or illicit substances, use of benzodiazepines, or any complaints from the patient. She has a sister with seizure disorder.   Assessment & Plan:   1.   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 Asthma  - No wheezing, cough, or dyspnea on admission  - Continue ICS/LABA, Singulair, and as-needed albuterol    3. Hypokalemia  - improved  DVT prophylaxis: lovenox Code Status: full  Family Communication: husband at bedside Disposition Plan: Home pending above work-up Consultants:   neurology  Procedures:   EEG (formal read pending)  Antimicrobials:  Anti-infectives (From admission, onward)   None     Subjective: Had episode this AM. Doing ok now.  Objective: Vitals:   04/24/18 2345 04/25/18 0712 04/25/18 0805 04/25/18 1215  BP: 114/90  136/78 (!) 136/97  Pulse: 79 73 77 86  Resp:  18 16 17 18   Temp: 98.3 F (36.8 C)  98.4 F (36.9 C) 97.7 F (36.5 C)  TempSrc: Oral  Oral Oral  SpO2: 95% 94% 96% 98%   No intake or output data in the 24 hours ending 04/25/18 1339 There were no vitals filed for this visit.  Examination:  General: Poorly responsive this morning, just had an episode of questionable seizures, moaning, opens eyes, mumbles few words, moves all extremities Cardiovascular: 2/regular rate rhythm Lungs: Clear to auscultation bilaterally Abdomen: Soft, nontender, nondistended Neurological: Moves all extremities, no localizing signs Skin: Warm and dry. No rashes or lesions. Extremities: No edema.  Psychiatric: Flat affect    Data Reviewed: I have personally reviewed following labs and imaging studies  CBC: Recent Labs  Lab 04/22/18 1621 04/23/18 0800 04/24/18 0414  WBC 6.8 5.0 5.2  NEUTROABS 4.2 2.8  --   HGB 14.4 13.9 14.2  HCT 43.6 43.2 43.3  MCV 89.2 91.9 90.8  PLT 218 204 206   Basic Metabolic Panel: Recent Labs  Lab 04/22/18 1621 04/22/18 1622 04/23/18 0800 04/24/18 0414  NA 143  --  141 143  K 3.3*  --  3.6 3.5  CL 108  --  108 109  CO2 26  --  23 26  GLUCOSE 133*  --  109* 110*  BUN 15  --  11 9  CREATININE 1.05*  --  0.74 0.72  CALCIUM 9.2  --  8.9 9.2  MG  --  2.1  --  2.1   GFR: CrCl cannot be calculated (Unknown ideal weight.). Liver Function Tests: Recent  Labs  Lab 04/22/18 1621 04/24/18 0414  AST 18 16  ALT 13 13  ALKPHOS 67 67  BILITOT 0.4 0.7  PROT 7.6 7.1  ALBUMIN 4.2 3.8   No results for input(s): LIPASE, AMYLASE in the last 168 hours. No results for input(s): AMMONIA in the last 168 hours. Coagulation Profile: No results for input(s): INR, PROTIME in the last 168 hours. Cardiac Enzymes: No results for input(s): CKTOTAL, CKMB, CKMBINDEX, TROPONINI in the last 168 hours. BNP (last 3 results) No results for input(s): PROBNP in the last 8760 hours. HbA1C: No results for input(s): HGBA1C in the last  72 hours. CBG: Recent Labs  Lab 04/22/18 1720 04/23/18 0846  GLUCAP 91 91   Lipid Profile: No results for input(s): CHOL, HDL, LDLCALC, TRIG, CHOLHDL, LDLDIRECT in the last 72 hours. Thyroid Function Tests: No results for input(s): TSH, T4TOTAL, FREET4, T3FREE, THYROIDAB in the last 72 hours. Anemia Panel: No results for input(s): VITAMINB12, FOLATE, FERRITIN, TIBC, IRON, RETICCTPCT in the last 72 hours. Sepsis Labs: No results for input(s): PROCALCITON, LATICACIDVEN in the last 168 hours.  No results found for this or any previous visit (from the past 240 hour(s)).       Radiology Studies: No results found.      Scheduled Meds: . ammonia  0.3 mL Inhalation Once  . brimonidine  1 drop Both Eyes BID  . enoxaparin (LOVENOX) injection  40 mg Subcutaneous Q24H  . famotidine  40 mg Oral QHS  . latanoprost  1 drop Both Eyes QHS  . mometasone-formoterol  2 puff Inhalation BID  . montelukast  10 mg Oral QHS  . pantoprazole  40 mg Oral Daily  . sodium chloride flush  3 mL Intravenous Q12H   Continuous Infusions:    LOS: 3 days    Time spent: over 30 min    Zannie Cove, MD [facility] Page via [email or web address]  If 7PM-7AM, please contact night-coverage [email or web address] Password [email or web address] 04/25/2018, 1:39 PM

## 2018-04-25 NOTE — Progress Notes (Signed)
Subjective: EEG with clearly nonepileptic spells.  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  I discussed this diagnosis with her and given her handout.  At this time, I have no reason to suspect concrement epilepsy and therefore would not recommend any antiepileptic medications.  Treatment would be outpatient psychotherapy.  Recommendations: 1) psychotherapy 2) no further recommendations from a neurological standpoint, please call with further questions or concerns.  Ritta SlotMcNeill Tywanda Rice, MD [facility] [phone]  If 7pm- 7am, please page neurology on call as listed in AMION.

## 2018-04-25 NOTE — Plan of Care (Signed)
  Problem: Clinical Measurements: Goal: Ability to maintain clinical measurements within normal limits will improve Outcome: Progressing   Problem: Health Behavior/Discharge Planning: Goal: Ability to manage health-related needs will improve Outcome: Progressing   Problem: Clinical Measurements: Goal: Respiratory complications will improve Outcome: Progressing   Problem: Clinical Measurements: Goal: Cardiovascular complication will be avoided Outcome: Progressing

## 2018-04-25 NOTE — Evaluation (Signed)
Physical Therapy Evaluation Patient Details Name: Megan Wilkerson MRN: [id number] DOB: Dec 27, 1965 Today's Date: 04/25/2018   History of Present Illness  Pt is a 52 y/o female admitted for seizure like activity. MRI normal. Pt with EEG during admission and had 3 episodes with seizure like movements, however, EEG normal. PMH includes asthma and OSA.   Clinical Impression  Pt admitted secondary to problem above with deficits below. Pt with inconsistent presentation. No physical assist required to stand, however, when attempting to take steps at EOB, pt laughing and with exaggerated LOB. Required assist for lowering to EOB with mod A. Further mobility deferred as no +2 assist available, and unsafe to attempt given LOB. Will continue to follow acutely to maximize functional mobility independence and safety and update recommendations based on pt progress.     Follow Up Recommendations Other (comment);Supervision/Assistance - 24 hour(HHPT vs SNF )    Equipment Recommendations  Rolling walker with 5" wheels;Wheelchair (measurements PT);Wheelchair cushion (measurements PT)    Recommendations for Other Services OT consult     Precautions / Restrictions Precautions Precautions: Fall Restrictions Weight Bearing Restrictions: No      Mobility  Bed Mobility Overal bed mobility: Needs Assistance Bed Mobility: Supine to Sit;Sit to Supine;Rolling Rolling: Supervision   Supine to sit: Supervision Sit to supine: Supervision   General bed mobility comments: Supervision for safety. Increased time required. Supervision to roll side to side for changing sheets.   Transfers Overall transfer level: Needs assistance Equipment used: Rolling walker (2 wheeled) Transfers: Sit to/from Stand Sit to Stand: Min guard         General transfer comment: Min guard for safety. Cues for safe hand placement. No LOB noted to stand.   Ambulation/Gait Ambulation/Gait assistance: Mod assist Gait Distance  (Feet): 1 Feet Assistive device: Rolling walker (2 wheeled) Gait Pattern/deviations: Step-through pattern;Decreased stride length;Staggering right Gait velocity: Decreased    General Gait Details: Pt with inconsistent presentation. Instructed pt to side step at EOB, however, pt turning to the side and taking steps. Pt then started lauging with overexaggerated LOB. Had to assist with lowering pt to EOB with mod A. Further mobility deferred, as pt with LOB and did not have +2 assist.   Stairs            Wheelchair Mobility    Modified Rankin (Stroke Patients Only)       Balance Overall balance assessment: Needs assistance Sitting-balance support: No upper extremity supported;Feet supported Sitting balance-Leahy Scale: Good     Standing balance support: Bilateral upper extremity supported;During functional activity Standing balance-Leahy Scale: Poor Standing balance comment: Reliant on UE and external support                              Pertinent Vitals/Pain Pain Assessment: No/denies pain    Home Living Family/patient expects to be discharged to:: Private residence Living Arrangements: Spouse/significant other Available Help at Discharge: Family;Available 24 hours/day Type of Home: House Home Access: Level entry     Home Layout: One level Home Equipment: None      Prior Function Level of Independence: Independent               Hand Dominance        Extremity/Trunk Assessment   Upper Extremity Assessment Upper Extremity Assessment: Defer to OT evaluation    Lower Extremity Assessment Lower Extremity Assessment: Overall WFL for tasks assessed    Cervical / Trunk Assessment Cervical /  Trunk Assessment: Kyphotic  Communication   Communication: No difficulties  Cognition Arousal/Alertness: Awake/alert Behavior During Therapy: WFL for tasks assessed/performed Overall Cognitive Status: Within Functional Limits for tasks assessed                                         General Comments General comments (skin integrity, edema, etc.): Pt's son and husband present during session.     Exercises     Assessment/Plan    PT Assessment Patient needs continued PT services  PT Problem List Decreased activity tolerance;Decreased mobility;Decreased balance       PT Treatment Interventions Gait training;DME instruction;Functional mobility training;Therapeutic activities;Therapeutic exercise;Balance training;Patient/family education    PT Goals (Current goals can be found in the Care Plan section)  Acute Rehab PT Goals Patient Stated Goal: to feel better PT Goal Formulation: With patient Time For Goal Achievement: 05/09/18 Potential to Achieve Goals: Good    Frequency Min 3X/week   Barriers to discharge        Co-evaluation               AM-PAC PT "6 Clicks" Mobility  Outcome Measure Help needed turning from your back to your side while in a flat bed without using bedrails?: A Little Help needed moving from lying on your back to sitting on the side of a flat bed without using bedrails?: A Little Help needed moving to and from a bed to a chair (including a wheelchair)?: A Little Help needed standing up from a chair using your arms (e.g., wheelchair or bedside chair)?: A Little Help needed to walk in hospital room?: A Lot Help needed climbing 3-5 steps with a railing? : A Lot 6 Click Score: 16    End of Session Equipment Utilized During Treatment: Gait belt Activity Tolerance: Patient tolerated treatment well Patient left: in bed;with call bell/phone within reach;with bed alarm set;with family/visitor present Nurse Communication: Mobility status PT Visit Diagnosis: Unsteadiness on feet (R26.81)    Time: 8119-1478 PT Time Calculation (min) (ACUTE ONLY): 23 min   Charges:   PT Evaluation $PT Eval Moderate Complexity: 1 Mod PT Treatments $Therapeutic Activity: 8-22 mins        Gladys Damme, PT, DPT  Acute Rehabilitation Services  Pager: [phone] Office: [phone]   Lehman Prom 04/25/2018, 5:08 PM

## 2018-04-26 NOTE — Progress Notes (Signed)
Physical Therapy Treatment Patient Details Name: Megan Wilkerson MRN: [id number] DOB: [birth year] Today's Date: 04/26/2018    History of Present Illness Pt is a 52 y/o female admitted for seizure like activity. MRI normal. Pt with EEG during admission and had 3 episodes with seizure like movements, however, EEG normal. PMH includes asthma and OSA.     PT Comments    Pt sitting on EOB with husband at bedside. Pt was very pleasant and highly motivated. Pt reporting that she feels "so much better today". Pt participated in gait training for 125 ft with RW requiring Min Assist for safety. Pt more steady this session able to stand at sink to participate in dynamic and standing balance. Pt would benefit from continued PT in order to progress toward stated goals  and maximize functional independence. HHPT for discharge based on current functional status and family support at home.    Follow Up Recommendations  Home health PT     Equipment Recommendations  Rolling walker with 5" wheels;Wheelchair (measurements PT);Wheelchair cushion (measurements PT)    Recommendations for Other Services OT consult     Precautions / Restrictions Precautions Precautions: Fall Restrictions Weight Bearing Restrictions: No    Mobility  Bed Mobility               General bed mobility comments: pt EOB upon arrival  Transfers Overall transfer level: Needs assistance Equipment used: Rolling walker (2 wheeled) Transfers: Sit to/from Stand Sit to Stand: Min guard         General transfer comment: Min guard for safety. Cues for safe hand placement. No LOB noted to stand.   Ambulation/Gait Ambulation/Gait assistance: Min assist Gait Distance (Feet): 125 Feet Assistive device: Rolling walker (2 wheeled) Gait Pattern/deviations: Step-through pattern;Decreased stride length     General Gait Details: Pt required Min Assist for safety and to prevent LOB. Pt ambulates with a slow gt speed. VC on  maintaining upright posture and assist with turns for safety. Pt required 1 seated rest break during gait.    Stairs             Wheelchair Mobility    Modified Rankin (Stroke Patients Only)       Balance Overall balance assessment: Needs assistance Sitting-balance support: No upper extremity supported;Feet supported Sitting balance-Leahy Scale: Good Sitting balance - Comments: Pt able to sit EOb without UE support and reaching across midline    Standing balance support: Bilateral upper extremity supported;During functional activity Standing balance-Leahy Scale: Fair Standing balance comment: Pt able to let go of RW at sink to prefrom dynamic standing balance. But relinat on RW for gait.                            Cognition Arousal/Alertness: Awake/alert Behavior During Therapy: WFL for tasks assessed/performed Overall Cognitive Status: Within Functional Limits for tasks assessed                                        Exercises      General Comments General comments (skin integrity, edema, etc.): Pt's husband present during session, he seems very supportive.      Pertinent Vitals/Pain Pain Assessment: No/denies pain    Home Living                      Prior Function  PT Goals (current goals can now be found in the care plan section) Acute Rehab PT Goals Patient Stated Goal: to feel better PT Goal Formulation: With patient Time For Goal Achievement: 05/09/18 Potential to Achieve Goals: Good Progress towards PT goals: Progressing toward goals    Frequency    Min 3X/week      PT Plan Current plan remains appropriate    Co-evaluation              AM-PAC PT "6 Clicks" Mobility   Outcome Measure  Help needed turning from your back to your side while in a flat bed without using bedrails?: A Little Help needed moving from lying on your back to sitting on the side of a flat bed without using  bedrails?: A Little Help needed moving to and from a bed to a chair (including a wheelchair)?: A Little Help needed standing up from a chair using your arms (e.g., wheelchair or bedside chair)?: A Little Help needed to walk in hospital room?: A Little Help needed climbing 3-5 steps with a railing? : A Lot 6 Click Score: 17    End of Session Equipment Utilized During Treatment: Gait belt Activity Tolerance: Patient tolerated treatment well Patient left: in chair;with call bell/phone within reach;with chair alarm set;with family/visitor present Nurse Communication: Mobility status PT Visit Diagnosis: Unsteadiness on feet (R26.81)     Time: 5784-6962 PT Time Calculation (min) (ACUTE ONLY): 16 min  Charges:  $Gait Training: 8-22 mins                     [address] SPTA   [place] 04/26/2018, 11:25 AM

## 2018-04-26 NOTE — Progress Notes (Signed)
OT Cancellation Note  Patient Details Name: Megan Wilkerson MRN: [id number] DOB: [birth year]   Cancelled Treatment:    Reason Eval/Treat Not Completed: Pt discharged prior to OT seeing pt.  Jeani HawkingWendi Adysen Raphael, OTR/L Acute Rehabilitation Services Pager [phone] Office [phone]   Jeani HawkingConarpe, Henry Demeritt M 04/26/2018, 10:42 AM

## 2018-04-26 NOTE — Progress Notes (Signed)
Discharge instructions, RX's and follow up appts explained and provided to patient and caregiver verbalized understanding. Patient left floor via wheelchair accompanied by staff. Patient d/c home no home health  or equipment needs per attending MD.  Megan Wilkerson, Megan Fichter Lynn, RN

## 2018-04-26 NOTE — Plan of Care (Signed)
  Problem: Education: Goal: Knowledge of General Education information will improve Description: Including pain rating scale, medication(s)/side effects and non-pharmacologic comfort measures Outcome: Completed/Met   Problem: Health Behavior/Discharge Planning: Goal: Ability to manage health-related needs will improve Outcome: Completed/Met   Problem: Clinical Measurements: Goal: Ability to maintain clinical measurements within normal limits will improve Outcome: Completed/Met Goal: Will remain free from infection Outcome: Completed/Met Goal: Diagnostic test results will improve Outcome: Completed/Met Goal: Respiratory complications will improve Outcome: Completed/Met Goal: Cardiovascular complication will be avoided Outcome: Completed/Met   Problem: Activity: Goal: Risk for activity intolerance will decrease Outcome: Completed/Met   Problem: Nutrition: Goal: Adequate nutrition will be maintained Outcome: Completed/Met   Problem: Coping: Goal: Level of anxiety will decrease Outcome: Completed/Met   Problem: Elimination: Goal: Will not experience complications related to bowel motility Outcome: Completed/Met Goal: Will not experience complications related to urinary retention Outcome: Completed/Met   Problem: Pain Managment: Goal: General experience of comfort will improve Outcome: Completed/Met   Problem: Safety: Goal: Ability to remain free from injury will improve Outcome: Completed/Met   Problem: Skin Integrity: Goal: Risk for impaired skin integrity will decrease Outcome: Completed/Met   Problem: Education: Goal: Expressions of having a comfortable level of knowledge regarding the disease process will increase Outcome: Completed/Met   Problem: Coping: Goal: Ability to adjust to condition or change in health will improve Outcome: Completed/Met Goal: Ability to identify appropriate support needs will improve Outcome: Completed/Met   Problem: Health  Behavior/Discharge Planning: Goal: Compliance with prescribed medication regimen will improve Outcome: Completed/Met   Problem: Medication: Goal: Risk for medication side effects will decrease Outcome: Completed/Met   Problem: Clinical Measurements: Goal: Complications related to the disease process, condition or treatment will be avoided or minimized Outcome: Completed/Met Goal: Diagnostic test results will improve Outcome: Completed/Met   Problem: Safety: Goal: Verbalization of understanding the information provided will improve Outcome: Completed/Met   Problem: Self-Concept: Goal: Level of anxiety will decrease Outcome: Completed/Met Goal: Ability to verbalize feelings about condition will improve Outcome: Completed/Met   

## 2018-04-27 ENCOUNTER — Other Ambulatory Visit: Payer: Self-pay

## 2018-04-27 ENCOUNTER — Encounter (HOSPITAL_COMMUNITY): Payer: Self-pay | Admitting: Emergency Medicine

## 2018-04-27 ENCOUNTER — Emergency Department (HOSPITAL_COMMUNITY)
Admission: EM | Admit: 2018-04-27 | Discharge: 2018-04-27 | Disposition: A | Discharge status: Home / Self Care | Payer: Managed Care, Other (non HMO) | Attending: Emergency Medicine | Admitting: Emergency Medicine

## 2018-04-27 DIAGNOSIS — R569 Unspecified convulsions: Secondary | ICD-10-CM | POA: Diagnosis not present

## 2018-04-27 DIAGNOSIS — Z79899 Other long term (current) drug therapy: Secondary | ICD-10-CM | POA: Insufficient documentation

## 2018-04-27 LAB — CBC WITH DIFFERENTIAL/PLATELET
Abs Immature Granulocytes: 0.01 10*3/uL (ref 0.00–0.07)
BASOS ABS: 0.1 10*3/uL (ref 0.0–0.1)
Basophils Relative: 1 %
EOS ABS: 0.2 10*3/uL (ref 0.0–0.5)
EOS PCT: 3 %
HCT: 46.6 % — ABNORMAL HIGH (ref 36.0–46.0)
Hemoglobin: 15.5 g/dL — ABNORMAL HIGH (ref 12.0–15.0)
Immature Granulocytes: 0 %
LYMPHS PCT: 37 %
Lymphs Abs: 2.6 10*3/uL (ref 0.7–4.0)
MCH: 29.9 pg (ref 26.0–34.0)
MCHC: 33.3 g/dL (ref 30.0–36.0)
MCV: 90 fL (ref 80.0–100.0)
Monocytes Absolute: 0.6 10*3/uL (ref 0.1–1.0)
Monocytes Relative: 8 %
NRBC: 0 % (ref 0.0–0.2)
Neutro Abs: 3.7 10*3/uL (ref 1.7–7.7)
Neutrophils Relative %: 51 %
Platelets: 240 10*3/uL (ref 150–400)
RBC: 5.18 MIL/uL — AB (ref 3.87–5.11)
RDW: 12.2 % (ref 11.5–15.5)
WBC: 7.1 10*3/uL (ref 4.0–10.5)

## 2018-04-27 LAB — BASIC METABOLIC PANEL
ANION GAP: 10 (ref 5–15)
BUN: 11 mg/dL (ref 6–20)
CHLORIDE: 106 mmol/L (ref 98–111)
CO2: 23 mmol/L (ref 22–32)
CREATININE: 0.9 mg/dL (ref 0.44–1.00)
Calcium: 9.8 mg/dL (ref 8.9–10.3)
GFR calc non Af Amer: >60 mL/min (ref >60)
Glucose, Bld: 108 mg/dL — ABNORMAL HIGH (ref 70–99)
Potassium: 4 mmol/L (ref 3.5–5.1)
SODIUM: 139 mmol/L (ref 135–145)

## 2018-04-27 MED ORDER — HYDROXYZINE HCL 25 MG PO TABS: 25.0000 mg | ORAL_TABLET | Freq: Three times a day (TID) | ORAL | 0 refills | Status: DC | PRN

## 2018-04-27 NOTE — ED Triage Notes (Signed)
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  This morning she had a seizure lasting about a minute,  Fire saw a seizure for 2 seconds where she then came around. Able to answer questions but unsure what happened.

## 2018-04-27 NOTE — ED Provider Notes (Signed)
[facility] EMERGENCY DEPARTMENT Provider Note   CSN: [id number] Arrival date & time: 04/27/18  1258     History   Chief Complaint Chief Complaint  Patient presents with  . Seizures    HPI Megan Wilkerson is a 52 y.o. female.  HPI Patient presents with seizure-like activity.  Has been recently admitted to the hospital for same.  Starting on Sunday with today being Friday had her first episode after she had an allergic reaction that did not get her EpiPen.  States she then began to shake.  Since then she has had more episodes.  Had extensive work-up on the hospital including CT MRI and EEG.  Diagnosis was nonepileptic seizure activity.  Had some mild hypokalemia.  Went home yesterday and today had a couple more episodes.  Reportedly knew it was coming but then states she does not remember it.  Husband states her hands went up in front of her little bit and started to shake.  She was not responsive during the episode was a little confused after.  Patient has had no episodes like this before the last week.  No history of head trauma.  No history of abuse.  No psychiatric history. Past Medical History:  Diagnosis Date  . Anaphylactic reaction    several ED visits for same since 01/2012  . Asthma   . GERD   . Hives of unknown origin    chronic and recurrent urticaria since 01/2012  . HYPERLIPIDEMIA, WITH LOW HDL   . MIGRAINE HEADACHE   . PERIPHERAL NEUROPATHY   . 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      Patient Active Problem List   Diagnosis Date Noted    04/22/2018  . SOB (shortness of breath) 12/08/2017  . Routine general medical examination at a health care facility 07/30/2015  . Sinusitis 08/29/2014  . Obese 07/23/2014  . Syncope 02/11/2013  . Hoarseness 10/25/2012  . Allergic rhinitis 10/25/2012  . Obstructive sleep apnea 10/25/2012  . 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 Allergic reaction 03/13/2012  . Hypokalemia 02/18/2011  . Asthma 01/12/2011    . PERIPHERAL NEUROPATHY 01/14/2010  . MAMMOGRAM, ABNORMAL, LEFT 04/22/2009  . HYPERLIPIDEMIA, WITH LOW HDL 09/03/2008  . ANEMIA-NOS 09/03/2008  . MIGRAINE HEADACHE 09/03/2008  . GERD 09/03/2008  . PPD positive 09/03/2008    Past Surgical History:  Procedure Laterality Date  . ABDOMINAL HYSTERECTOMY  1996  . APPENDECTOMY  1992  . CESAREAN SECTION  1989  . CHOLECYSTECTOMY  04/22/2011   Procedure: LAPAROSCOPIC CHOLECYSTECTOMY WITH INTRAOPERATIVE CHOLANGIOGRAM;  Surgeon: Almond Lint, MD;  Location: [facility] OR;  Service: General;  Laterality: N/A;  . CYST REMOVAL HAND Right 1997     OB History    Gravida  5   Para  5   Term      Preterm      AB      Living  5     SAB      TAB      Ectopic      Multiple      Live Births               Home Medications    Prior to Admission medications   Medication Sig Start Date End Date Taking? Authorizing Provider  albuterol (PROVENTIL HFA;VENTOLIN HFA) 108 (90 Base) MCG/ACT inhaler Inhale 2 puffs into the lungs every 6 (six) hours as needed for wheezing or shortness of breath. 12/19/17  Kozlow, Alvira Philips, MD  albuterol (PROVENTIL) (2.5 MG/3ML) 0.083% nebulizer solution Take 3 mLs (2.5 mg total) by nebulization every 6 (six) hours as needed for wheezing or shortness of breath. 02/11/15   Jetty Duhamel D, MD  brimonidine (ALPHAGAN) 0.15 % ophthalmic solution Place 1 drop into both eyes 2 (two) times daily.    [provider]  budesonide-formoterol (SYMBICORT) 160-4.5 MCG/ACT inhaler Inhale two puffs twice daily to prevent cough or wheeze. Use with spacer.  Rinse, gargle, and spit after use. 12/04/17   Myrlene Broker, MD  dexlansoprazole (DEXILANT) 60 MG capsule Take 1 capsule (60 mg total) by mouth daily. 12/19/17   Kozlow, Alvira Philips, MD  EPINEPHrine 0.3 mg/0.3 mL IJ SOAJ injection Inject 0.3 mLs (0.3 mg total) into the muscle once for 1 dose. 11/27/17 04/22/18  Benjiman Core, MD  fexofenadine (ALLEGRA) 180 MG tablet Take  180 mg by mouth daily.    [provider]  fluticasone (FLONASE) 50 MCG/ACT nasal spray Place 2 sprays into both nostrils daily. 12/04/17   Myrlene Broker, MD  hydrOXYzine (ATARAX/VISTARIL) 25 MG tablet Take 1 tablet (25 mg total) by mouth every 8 (eight) hours as needed for anxiety (Take if you feel you are going to have a seizure). 04/27/18   Benjiman Core, MD  latanoprost (XALATAN) 0.005 % ophthalmic solution Place 1 drop into both eyes at bedtime.  10/31/17   [provider]  montelukast (SINGULAIR) 10 MG tablet Take 1 tablet (10 mg total) by mouth daily. 12/04/17   Myrlene Broker, MD  Nebulizers (COMPRESSOR/NEBULIZER) MISC As directed 02/11/15   Waymon Budge, MD  pantoprazole (PROTONIX) 40 MG tablet Take 1 tablet (40 mg total) by mouth daily. 12/21/17   Kozlow, Alvira Philips, MD  Pyridoxine HCl (B-6 PO) Take 1 tablet by mouth daily at 12 noon.    [provider]  ranitidine (ZANTAC) 300 MG capsule Take 1 capsule (300 mg total) by mouth every evening. 12/19/17   Kozlow, Alvira Philips, MD    Family History Family History  Problem Relation Age of Onset  . Prostate cancer Father   . Hypertension Father   . Heart disease Father   . Diabetes Mother   . Diabetes Brother   . Alcohol abuse Other   . Arthritis Other   . Hypertension Other   . Seizures Sister     Social History Social History   Tobacco Use  . Smoking status: Never Smoker  . Smokeless tobacco: Never Used  Substance Use Topics  . Alcohol use: No  . Drug use: No     Allergies   Milk-related compounds; Other; Peanuts [peanut oil]; Corn dextrin; Gluten meal; Shellfish allergy; Soy allergy; Sunflower oil; Tomato; Wheat bran; and Morphine   Review of Systems Review of Systems  Constitutional: Negative for appetite change.  HENT: Negative for congestion.   Respiratory: Negative for shortness of breath.   Cardiovascular: Negative for chest pain.  Gastrointestinal: Negative for abdominal  distention.  Genitourinary: Negative for flank pain.  Musculoskeletal: Negative for back pain.  Neurological: Positive for seizures. Negative for tremors.  Psychiatric/Behavioral: The patient is not nervous/anxious.      Physical Exam Updated Vital Signs BP (!) 137/95   Pulse 88   Temp 98.9 F (37.2 C) (Oral)   Resp 18   Ht 5\' 6"  (1.676 m)   Wt 104.3 kg   SpO2 100%   BMI 37.12 kg/m   Physical Exam  Constitutional: She is oriented to person,  place, and time. She appears well-developed.  HENT:  Head: Normocephalic.  Eyes: Pupils are equal, round, and reactive to light.  Neck: Neck supple.  Cardiovascular: Normal rate.  Pulmonary/Chest: Effort normal.  Abdominal: There is no tenderness.  Musculoskeletal: She exhibits no edema.  Neurological: She is alert and oriented to person, place, and time.  Skin: Skin is warm. Capillary refill takes less than 2 seconds.  Psychiatric:  Patient does appear somewhat anxious.     ED Treatments / Results  Labs (all labs ordered are listed, but only abnormal results are displayed) Labs Reviewed  BASIC METABOLIC PANEL - Abnormal; Notable for the following components:      Result Value   Glucose, Bld 108 (*)    All other components within normal limits  CBC WITH DIFFERENTIAL/PLATELET - Abnormal; Notable for the following components:   RBC 5.18 (*)    Hemoglobin 15.5 (*)    HCT 46.6 (*)    All other components within normal limits    EKG None  Radiology No results found.  Procedures Procedures (including critical care time)  Medications Ordered in ED Medications - No data to display   Initial Impression / Assessment and Plan / ED Course  I have reviewed the triage vital signs and the nursing notes.  Pertinent labs & imaging results that were available during my care of the patient were reviewed by me and considered in my medical decision making (see chart for details).     Patient with recurrent nonepileptic seizure  activity.  Recent work-up for same inpatient.  Had EEG monitoring at that captured at least 3 episodes without any EEG abnormality.  MRI reassuring.  Also discussed with Dr. Amada JupiterKirkpatrick who saw the patient while she was in the hospital previously.  Will add Atarax to see if it could help as a as needed medicine.  Follow-up with PCP and potentially psychiatry.  Final Clinical Impressions(s) / ED Diagnoses   Final diagnoses:  Nonepileptic episode [facility](HCC)    ED Discharge Orders         Ordered    hydrOXYzine (ATARAX/VISTARIL) 25 MG tablet  Every 8 hours PRN     04/27/18 1442           Benjiman CorePickering, Taiquan Campanaro, MD 04/27/18 1444

## 2018-04-27 NOTE — ED Notes (Signed)
Discharge instructions and prescriptions discussed with Pt. Pt verbalized understanding. Pt stable and ambulatory.   

## 2018-04-29 NOTE — Discharge Summary (Signed)
Physician Discharge Summary  [name]:[id number] DOB: 06-22-1965 DOA: 04/22/2018  PCP: Myrlene Broker, MD  Admit date: 04/22/2018 Discharge date: 04/26/2018  Time spent: 35 minutes  Recommendations for Outpatient Follow-up:  1. PCP in 1 week,  2. Psychiatry FU in 2weeks   Discharge Diagnoses:  Principal Problem:   PseudoSeizures (HCC)   Asthma   Hypokalemia   Obstructive sleep apnea   Discharge Condition: stable  Diet recommendation: regular  There were no vitals filed for this visit.  History of present illness:  Megan Mooreis a 52 y.o.femalewith medical history significant forasthma and allergies with anaphylactic reactions, now presenting to the emergency department after a series of seizures. Patient does not have history of seizures. She is accompanied by her husband and sister who assist with the history. Patient was reportedly in her usual state of health this morning, went to church with her sister, and was noted to have generalized shaking with loss of consciousness. This lasted for at least a couple minutes per report of the patient's sister, but soon after she stopped shaking, it started again. EMS was called and brought the patient into the ED. There has not been any recent fall, trauma, use of alcohol or illicit substances, use of benzodiazepines, or any complaints   Hospital Course:   1.  Recurrent seizures  -Pseudoseizures suspected with inconsistent history and noted to be moaning while having seizure like movements -atypical symptoms/findings and inconsistent exam  -Neurology consulted, Keppra discontinued, continuous EEG monitoring completed she had 3 episodes with continuous EEG monitoring ongoing and these didn't correlate with seizure activity on the monitor -Neurology confirmed pseudoseizures, we discussed this diagnosis with Patient and spouse -discharged patient without antiepileptics and recommended Outpatient psychiatry  Follow up  2.Asthma -No wheezing, cough, or dyspnea on admission -Continue ICS/LABA, Singulair, and as-needed albuterol  3.Hypokalemia -improved  Consultations:  Neurology  Discharge Exam: Vitals:   04/26/18 0847 04/26/18 0933  BP: (!) 128/98   Pulse: 88 90  Resp: 20 18  Temp: 98.5 F (36.9 C)   SpO2: 96% 99%    General: AAOx3 Cardiovascular:S1S2/RRR Respiratory: CTAB  Discharge Instructions   Discharge Instructions    Ambulatory referral to Neurology   Complete by:  As directed    An appointment is requested in approximately: 2 weeks   Diet - low sodium heart healthy   Complete by:  As directed    Diet - low sodium heart healthy   Complete by:  As directed    Discharge instructions   Complete by:  As directed    You were seen for concern for seizures.  You've improved at this point in time.  Your workup has been essentially unremarkable up until this point.   We're going to have you follow up with neurology as an outpatient.  We've started keppra twice daily for you.  Please continue this.  Follow seizure precautions as outlined below.  Per [facility] statutes, patients with seizures are not allowed to drive until  they have been seizure-free for six months. Use caution when using heavy equipment or power tools. Avoid working on ladders or at heights. Take showers instead of baths. Ensure the water temperature is not too high on the home water heater. Do not go swimming alone. When caring for infants or small children, sit down when holding, feeding, or changing them to minimize risk of injury to the child in the event you have a seizure. Also, Maintain good sleep hygiene. Avoid alcohol.  Return for new, recurrent, or worsening symptoms.  Please ask your PCP to request records from this hospitalization so they know what was done and what the next steps will be.   Increase activity slowly   Complete by:  As directed    Increase activity  slowly   Complete by:  As directed      Allergies as of 04/26/2018      Reactions   Milk-related Compounds    Mouth itching, dizziness, coughing, wheezing and SOB   Other Anaphylaxis   Archie Balboa rancher gummies and paprika   Peanuts [peanut Oil] Anaphylaxis, Itching   Corn Dextrin Other (See Comments)   Plain corn ok, popcorn causes hives   Gluten Meal Other (See Comments)   cough   Shellfish Allergy Itching   Soy Allergy Other (See Comments)   coughing   Sunflower Oil Itching   Tomato Itching   Wheat Bran    swelling   Morphine Itching, Rash      Medication List    TAKE these medications   albuterol (2.5 MG/3ML) 0.083% nebulizer solution Commonly known as:  PROVENTIL Take 3 mLs (2.5 mg total) by nebulization every 6 (six) hours as needed for wheezing or shortness of breath.   albuterol 108 (90 Base) MCG/ACT inhaler Commonly known as:  PROVENTIL HFA;VENTOLIN HFA Inhale 2 puffs into the lungs every 6 (six) hours as needed for wheezing or shortness of breath.   B-6 PO Take 1 tablet by mouth daily at 12 noon.   brimonidine 0.15 % ophthalmic solution Commonly known as:  ALPHAGAN Place 1 drop into both eyes 2 (two) times daily.   budesonide-formoterol 160-4.5 MCG/ACT inhaler Commonly known as:  SYMBICORT Inhale two puffs twice daily to prevent cough or wheeze. Use with spacer.  Rinse, gargle, and spit after use.   Compressor/Nebulizer Misc As directed   dexlansoprazole 60 MG capsule Commonly known as:  DEXILANT Take 1 capsule (60 mg total) by mouth daily.   EPINEPHrine 0.3 mg/0.3 mL Soaj injection Commonly known as:  EPI-PEN Inject 0.3 mLs (0.3 mg total) into the muscle once for 1 dose.   fexofenadine 180 MG tablet Commonly known as:  ALLEGRA Take 180 mg by mouth daily.   fluticasone 50 MCG/ACT nasal spray Commonly known as:  FLONASE Place 2 sprays into both nostrils daily.   latanoprost 0.005 % ophthalmic solution Commonly known as:  XALATAN Place 1 drop  into both eyes at bedtime.   montelukast 10 MG tablet Commonly known as:  SINGULAIR Take 1 tablet (10 mg total) by mouth daily.   pantoprazole 40 MG tablet Commonly known as:  PROTONIX Take 1 tablet (40 mg total) by mouth daily.   ranitidine 300 MG capsule Commonly known as:  ZANTAC Take 1 capsule (300 mg total) by mouth every evening.      Allergies  Allergen Reactions  . Milk-Related Compounds     Mouth itching, dizziness, coughing, wheezing and SOB  . Other Anaphylaxis    Archie Balboa rancher gummies and paprika   . Peanuts [Peanut Oil] Anaphylaxis and Itching  . Corn Dextrin Other (See Comments)    Plain corn ok, popcorn causes hives  . Gluten Meal Other (See Comments)    cough  . Shellfish Allergy Itching  . Soy Allergy Other (See Comments)    coughing  . Sunflower Oil Itching  . Tomato Itching  . Wheat Bran     swelling  . Morphine Itching and Rash   Follow-up Information  Myrlene Broker, MD Follow up.   Specialty:  Internal Medicine Contact information: [address] [place] [address] [phone]        [facility] Follow up.   Why:  Please call for an appointment Contact information: [address]     [address] [place] [address] [phone]           The results of significant diagnostics from this hospitalization (including imaging, microbiology, ancillary and laboratory) are listed below for reference.    Significant Diagnostic Studies: Ct Head W & Wo Contrast  Result Date: 04/22/2018 CLINICAL DATA:  Witnessed seizure at church. History of migraine, seizures and hyperlipidemia. EXAM: CT HEAD WITHOUT AND WITH CONTRAST TECHNIQUE: Contiguous axial images were obtained from the base of the skull through the vertex without and with intravenous contrast CONTRAST:  75mL OMNIPAQUE IOHEXOL 300 MG/ML  SOLN COMPARISON:  CT HEAD March 21, 2012 FINDINGS: BRAIN: No intraparenchymal hemorrhage, mass  effect nor midline shift. The ventricles and sulci are normal. No acute large vascular territory infarcts. No abnormal extra-axial fluid collections. No abnormal intraparenchymal or extra-axial enhancement. Basal cisterns are patent. VASCULAR: Unremarkable. SKULL/SOFT TISSUES: No skull fracture. Empty sella. No significant soft tissue swelling. ORBITS/SINUSES: The included ocular globes and orbital contents are normal.Trace paranasal sinus mucosal thickening. Mastoid air cells are well aerated. OTHER: None. IMPRESSION: 1. No acute intracranial process. 2. Empty sella. Otherwise unremarkable CT HEAD with and without contrast. Electronically Signed   By: Awilda Metro M.D.   On: 04/22/2018 19:21   Mr Laqueta Jean XB Contrast  Result Date: 04/23/2018 CLINICAL DATA:  52 year old female with new onset seizure activity. First seizure at church yesterday. EXAM: MRI HEAD WITHOUT AND WITH CONTRAST TECHNIQUE: Multiplanar, multiecho pulse sequences of the brain and surrounding structures were obtained without and with intravenous contrast. CONTRAST:  10 milliliters Gadavist COMPARISON:  head CTs 04/22/2018 and earlier FINDINGS: Brain: Partially empty sella. Cerebral volume is within normal limits for age. No restricted diffusion to suggest acute infarction. No midline shift, mass effect, evidence of mass lesion, ventriculomegaly, extra-axial collection or acute intracranial hemorrhage. Cervicomedullary junction within normal limits. Wallace Cullens and white matter signal is within normal limits for age throughout the brain. No cortical encephalomalacia or chronic cerebral blood products. On thin slice T2 coronal images the hippocampal formations appear symmetric and within normal limits. The deep gray matter nuclei, brainstem, and cerebellum appear normal. No abnormal enhancement identified. No dural thickening Vascular: Major intracranial vascular flow voids are preserved with mild intracranial artery tortuosity. The major dural  venous sinuses are enhancing and appear patent. Skull and upper cervical spine: Negative visible cervical spine. Visualized bone marrow signal is within normal limits. Sinuses/Orbits: Negative. Other: There is a small volume of petrous apex fluid greater on the left, significance is doubtful. The mastoids are clear. Other internal auditory structures appear normal. Scalp and face soft tissues appear negative. IMPRESSION: 1. Normal MRI appearance of the brain aside from partially empty sella, which can be a normal variant or associated with idiopathic intracranial hypertension (pseudotumor cerebri). 2. Mild bilateral petrous air cell fluid is nonspecific but appears inconsequential. Electronically Signed   By: Odessa Fleming M.D.   On: 04/23/2018 07:19    Microbiology: No results found for this or any previous visit (from the past 240 hour(s)).   Labs: Basic Metabolic Panel: Recent Labs  Lab 04/22/18 1621 04/22/18 1622 04/23/18 0800 04/24/18 0414 04/27/18 1327  NA 143  --  141 143 139  K 3.3*  --  3.6 3.5 4.0  CL 108  --  108 109 106  CO2 26  --  23 26 23   GLUCOSE 133*  --  109* 110* 108*  BUN 15  --  11 9 11   CREATININE 1.05*  --  0.74 0.72 0.90  CALCIUM 9.2  --  8.9 9.2 9.8  MG  --  2.1  --  2.1  --    Liver Function Tests: Recent Labs  Lab 04/22/18 1621 04/24/18 0414  AST 18 16  ALT 13 13  ALKPHOS 67 67  BILITOT 0.4 0.7  PROT 7.6 7.1  ALBUMIN 4.2 3.8   No results for input(s): LIPASE, AMYLASE in the last 168 hours. No results for input(s): AMMONIA in the last 168 hours. CBC: Recent Labs  Lab 04/22/18 1621 04/23/18 0800 04/24/18 0414 04/27/18 1327  WBC 6.8 5.0 5.2 7.1  NEUTROABS 4.2 2.8  --  3.7  HGB 14.4 13.9 14.2 15.5*  HCT 43.6 43.2 43.3 46.6*  MCV 89.2 91.9 90.8 90.0  PLT 218 204 206 240   Cardiac Enzymes: No results for input(s): CKTOTAL, CKMB, CKMBINDEX, TROPONINI in the last 168 hours. BNP: BNP (last 3 results) No results for input(s): BNP in the last 8760  hours.  ProBNP (last 3 results) No results for input(s): PROBNP in the last 8760 hours.  CBG: Recent Labs  Lab 04/22/18 1720 04/23/18 0846  GLUCAP 91 91       Signed:  Zannie Cove MD.  [facility] 04/29/2018, 3:03 PM

## 2018-04-30 ENCOUNTER — Telehealth: Payer: Self-pay | Admitting: Neurology

## 2018-04-30 NOTE — Telephone Encounter (Signed)
Husband of patient called on weekend requesting follow up post hospitalization for "seizure like symptoms ". I found no evidence that the patient had seen Dr Terrace ArabiaYan before, but the answering service mentioned "Dr Jeannie FendY. "

## 2018-05-01 ENCOUNTER — Ambulatory Visit: Payer: Managed Care, Other (non HMO) | Admitting: Internal Medicine

## 2018-05-01 ENCOUNTER — Encounter: Payer: Self-pay | Admitting: Internal Medicine

## 2018-05-01 DIAGNOSIS — F445 Conversion disorder with seizures or convulsions: Secondary | ICD-10-CM | POA: Diagnosis not present

## 2018-05-01 MED ORDER — HYDROXYZINE HCL 25 MG PO TABS: 25.0000 mg | ORAL_TABLET | Freq: Three times a day (TID) | ORAL | 0 refills | Status: DC | PRN

## 2018-05-01 NOTE — Assessment & Plan Note (Signed)
Could be related to recent allergic reaction or also recent increase in caffeine with poor sleep. She is asked to stop caffeine and try melatonin for sleep and chamomile or lavender. She is asked not to drive for 6 months from last episode. If recurrent asked to keep journal of symptoms, food, sleep. Refill atarax.

## 2018-05-01 NOTE — Progress Notes (Signed)
Subjective:    Patient ID: Megan Wilkerson, female    DOB: [birth year], 52 y.o.   MRN: [id number]  HPI The patient is a 52 YO female coming in for hospital follow up (in for shaking and seizure like activity, she was hospitalized on keppra and then decided that they were pseudoseizures, meds were stopped and EEG continuous was going which captured several events and they did not have EEG features of seizures). She then went home and went back to ER with several more episodes. She was then given atarax to use as needed. She is doing better since that time. She is using the atarax and this seems to help. She feels and her husband that an allergic reaction at the onset which was not treated was the trigger. She did not ever get prednisone or steroids at the hospital. She does feel that the allergic reaction is gone. Denies current episodes since this weekend. She is not driving for 6 months. Denies chest pains or SOB or cough. Denies sinus or cold symptoms. Denies abdominal pain, chronic constipation which is unchanged. Has increased caffeine as she started back sodas again in the last month or so.   PMH, [facility], social history reviewed and updated  Review of Systems  Constitutional: Negative.   HENT: Negative.   Eyes: Negative.   Respiratory: Negative for cough, chest tightness and shortness of breath.   Cardiovascular: Negative for chest pain, palpitations and leg swelling.  Gastrointestinal: Negative for abdominal distention, abdominal pain, constipation, diarrhea, nausea and vomiting.  Musculoskeletal: Negative.   Skin: Negative.   Neurological: Positive for tremors.  Psychiatric/Behavioral: Negative.       Objective:   Physical Exam  Constitutional: She is oriented to person, place, and time. She appears well-developed and well-nourished.  HENT:  Head: Normocephalic and atraumatic.  Eyes: EOM are normal.  Neck: Normal range of motion.  Cardiovascular: Normal rate and regular rhythm.    Pulmonary/Chest: Effort normal and breath sounds normal. No respiratory distress. She has no wheezes. She has no rales.  Abdominal: Soft. Bowel sounds are normal. She exhibits no distension. There is no tenderness. There is no rebound.  Musculoskeletal: She exhibits no edema.  Neurological: She is alert and oriented to person, place, and time. Coordination normal.  Skin: Skin is warm and dry.  Psychiatric: She has a normal mood and affect.   Vitals:   05/01/18 1312  BP: 138/80  Pulse: 93  Temp: 98.3 F (36.8 C)  TempSrc: Oral  SpO2: 98%  Weight: 235 lb (106.6 kg)  Height: 5\' 6"  (1.676 m)      Assessment & Plan:

## 2018-05-01 NOTE — Patient Instructions (Signed)
We have sent in the refill of the hydroxyzine.   Try decreasing the caffeine.   Try melatonin for sleep to see if this helps. Another thing is lavender or chamomile to help you sleep. Another thing to try is relaxation exercises to help you fall asleep in the night if you wake up.

## 2018-05-29 ENCOUNTER — Other Ambulatory Visit: Payer: Self-pay | Admitting: Internal Medicine

## 2018-05-29 MED ORDER — HYDROXYZINE HCL 25 MG PO TABS: 25.0000 mg | ORAL_TABLET | Freq: Three times a day (TID) | ORAL | 0 refills | Status: DC | PRN

## 2018-05-29 NOTE — Telephone Encounter (Signed)
Requested Prescriptions  Pending Prescriptions Disp Refills  . hydrOXYzine (ATARAX/VISTARIL) 25 MG tablet 45 tablet 0    Sig: Take 1 tablet (25 mg total) by mouth every 8 (eight) hours as needed for anxiety (Take if you feel you are going to have a seizure).     Ear, Nose, and Throat:  Antihistamines Passed - 05/29/2018 11:45 AM      Passed - Valid encounter within last 12 months    Recent [facility]its          [facility]   [facility] Crawford, Elizabeth A, MD   5 months ago SOB (shortness of breath)   [facility] -[facility] Modena[facility] Crawford, Elizabeth A, MD   5 months ago Anaphylactic shock due to food, subsequent encounter   [facility] -Willis ModenaElam Crawford, Elizabeth A, MD   11 months ago Flu-like symptoms   [facility] -Willis ModenaElam Crawford, Elizabeth A, MD   2 years ago Allergic reaction, initial encounter   [facility] -Will[facility] ModenaElam Crawford, Elizabeth A, MD

## 2018-05-29 NOTE — Telephone Encounter (Signed)
Copied from CRM [id number]. Topic: Quick Communication - Rx Refill/Question >> May 29, 2018 11:08 AM Lynne LoganHudson, Caryn D wrote: Medication: hydrOXYzine (ATARAX/VISTARIL) 25 MG tablet / Pt stated that she is still having seizures  Has the patient contacted their pharmacy? Yes.   (Agent: If no, request that the patient contact the pharmacy for the refill.) (Agent: If yes, when and what did the pharmacy advise?)  Preferred Pharmacy (with phone number or street name): [facility] [address] - [place], [place] - [id number] [address] [facility] [place] [phone] (Phone) [phone])    Agent: Please be advised that RX refills may take up to 3 business days. We ask that you follow-up with your pharmacy.

## 2018-06-18 ENCOUNTER — Telehealth: Payer: Self-pay | Admitting: Internal Medicine

## 2018-06-18 ENCOUNTER — Ambulatory Visit: Payer: Self-pay

## 2018-06-18 NOTE — Telephone Encounter (Signed)
Patient called in for a prescription refill of Hydroxyzine and said she's still having seizures. Patient called to triage her symptoms. She says that she had 4 seizures yesterday while sitting in a church service, 1 seizure this morning, 3 seizures last week and 8 seizures 2 weeks ago. She says that she doesn't know how long they last, but when she comes out, she's alert, oriented and chooses not to call 911, because she says there is nothing that can be done. She says the Hydroxyzine is not helping her seizures, but she keeps taking them anyway. She says she has not called the office to schedule an appointment with Dr. Okey Dupre and was not told to follow up with a neurologist. She denies injury. Appointment scheduled for tomorrow at 1000 with Dr. Okey Dupre, care advice given, patient verbalized understanding.   Reason for Disposition . Stopped taking seizure medicine (anticonvulsant)  Answer Assessment - Initial Assessment Questions 1. ONSET: "How long did the seizure last?" (Minutes)      I don't know 2. CONTENT: "Describe what happened during the seizure. Did the body become stiff? Was there any jerking?"      Body gets stiff, jerking; 4 times back to back on yesterday 3. CIRCUMSTANCE: "What was the individual doing when the seizure began?"      In church service 4. MENTAL STATUS: "Does he know who he is, who you are, and where he is?"      Yes 5. PRIOR SEIZURES: "Has the individual had a seizure (convulsion) before?" If so, ask: "When was the last time?" and "What happened last time?"     Yes; yesterday 4 times back to back 6. EPILEPSY: "Does the individual have epilepsy?" (note: check for medical ID bracelet)     No 7. MEDICATIONS: "Does the individual take anticonvulsant medications?" (e.g., yes/no, compliance, any recent changes)     No, just taking Hydralazine 8. INJURY: "Did the individual hurt himself during the seizure?" (e.g., head, tongue)     No 9. OTHER SYMPTOMS: "Are there any  other symptoms?" (e.g., fever, headache)     Headache after and sometimes before 10. PREGNANCY: "Is there any chance you are pregnant?" "When was your last menstrual period?"       No  Protocols used: [facility]

## 2018-06-18 NOTE — Telephone Encounter (Signed)
Copied from CRM [id number]. Topic: Quick Communication - Rx Refill/Question >> Jun 18, 2018  2:30 PM Baldo Daub L wrote: Medication: hydrOXYzine (ATARAX/VISTARIL) 25 MG tablet   Has the patient contacted their pharmacy? Yes - no refills, must contact office.  Pt states she only has one pill left and continues to have seizures. (Agent: If no, request that the patient contact the pharmacy for the refill.) (Agent: If yes, when and what did the pharmacy advise?)  Preferred Pharmacy (with phone number or street name): [facility] [address] [place] - [address] [place] [phone] (Phone) [phone] (Fax)  Agent: Please be advised that RX refills may take up to 3 business days. We ask that you follow-up with your pharmacy.

## 2018-06-18 NOTE — Telephone Encounter (Signed)
Patient called to discuss the refill request and her saying she's still having seizures, see triage encounter.

## 2018-06-18 NOTE — Telephone Encounter (Signed)
Requested medication (s) are due for refill today: Yes  Requested medication (s) are on the active medication list: Yes  Last refill: 05/29/18  Future visit scheduled: Yes  Notes to clinic:  Send back to provider for refill due to why it is being prescribed (Anxiety/seizures)     Requested Prescriptions  Pending Prescriptions Disp Refills   hydrOXYzine (ATARAX/VISTARIL) 25 MG tablet 45 tablet 0    Sig: Take 1 tablet (25 mg total) by mouth every 8 (eight) hours as needed for anxiety (Take if you feel you are going to have a seizure).     Ear, Nose, and Throat:  Antihistamines Passed - 06/18/2018  3:53 PM      Passed - Valid encounter within last 12 months    Recent Outpatient Visits          1 month ago [facility] Mod[facility]   6 months ago SOB (shortness of breath)   [facility] -Willis Modena, [facility]   6 months ago Anaphylactic shock due to food, subsequent encounter   [facility] -Will[facility] Mod[facility], [facility]   12 months ago Flu-like symptoms   [facility] -Will[facility] Mod[facility], [facility]   2 years ago Allergic reaction, initial encounter   [facility] -[facility] Mod[facility], [facility]

## 2018-06-19 ENCOUNTER — Ambulatory Visit: Payer: Managed Care, Other (non HMO) | Admitting: Internal Medicine

## 2018-06-19 ENCOUNTER — Other Ambulatory Visit (INDEPENDENT_AMBULATORY_CARE_PROVIDER_SITE_OTHER): Payer: Managed Care, Other (non HMO)

## 2018-06-19 ENCOUNTER — Encounter: Payer: Self-pay | Admitting: Internal Medicine

## 2018-06-19 VITALS — BP 130/88 | HR 87 | Temp 98.3°F | Ht 66.0 in | Wt 248.0 lb

## 2018-06-19 DIAGNOSIS — R569 Unspecified convulsions: Secondary | ICD-10-CM

## 2018-06-19 DIAGNOSIS — F445 Conversion disorder with seizures or convulsions: Secondary | ICD-10-CM | POA: Diagnosis not present

## 2018-06-19 LAB — COMPREHENSIVE METABOLIC PANEL
ALBUMIN: 4.2 g/dL (ref 3.5–5.2)
ALT: 25 U/L (ref 0–35)
AST: 23 U/L (ref 0–37)
Alkaline Phosphatase: 74 U/L (ref 39–117)
BUN: 8 mg/dL (ref 6–23)
CHLORIDE: 105 meq/L (ref 96–112)
CO2: 28 mEq/L (ref 19–32)
CREATININE: 0.81 mg/dL (ref 0.40–1.20)
Calcium: 9.8 mg/dL (ref 8.4–10.5)
GFR: 89.99 mL/min (ref >60.00)
Glucose, Bld: 114 mg/dL — ABNORMAL HIGH (ref 70–99)
Potassium: 3.6 mEq/L (ref 3.5–5.1)
SODIUM: 142 meq/L (ref 135–145)
Total Bilirubin: 0.4 mg/dL (ref 0.2–1.2)
Total Protein: 7.5 g/dL (ref 6.0–8.3)

## 2018-06-19 LAB — CBC
HCT: 41.5 % (ref 36.0–46.0)
Hemoglobin: 14.2 g/dL (ref 12.0–15.0)
MCHC: 34.2 g/dL (ref 30.0–36.0)
MCV: 87.2 fl (ref 78.0–100.0)
Platelets: 239 10*3/uL (ref 150.0–400.0)
RBC: 4.75 Mil/uL (ref 3.87–5.11)
RDW: 13.2 % (ref 11.5–15.5)
WBC: 5.3 10*3/uL (ref 4.0–10.5)

## 2018-06-19 LAB — VITAMIN D 25 HYDROXY (VIT D DEFICIENCY, FRACTURES): VITD: 30.86 ng/mL (ref 30.00–100.00)

## 2018-06-19 LAB — HEMOGLOBIN A1C: HEMOGLOBIN A1C: 6.3 % (ref 4.6–6.5)

## 2018-06-19 LAB — TSH: TSH: 1.19 u[IU]/mL (ref 0.35–4.50)

## 2018-06-19 MED ORDER — BUSPIRONE HCL 5 MG PO TABS: 5.0000 mg | ORAL_TABLET | Freq: Three times a day (TID) | ORAL | 3 refills | Status: DC

## 2018-06-19 NOTE — Progress Notes (Signed)
Subjective:   Patient ID: Megan Wilkerson, female    DOB: [birth year], 53 y.o.   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 She has been having then since our last visit and they are worsening. She thinks that the hydroxyzine has worsened them and stopped this yesterday. She had 5 this morning and about 10 yesterday. She is having multiple episodes in a day and usually several in a row. They last a few minutes but she seems to come around quickly afterwards. Had several at church recently. She denies ongoing stress or anxiety or depression. She denies new stressors in life or changes. She denies caffeine intake as she stopped this after our last visit 1-2 months ago. She is not driving currently. The episodes do not seem to have triggers. No new foods or allergic symptoms. She denies headaches, chest pains, SOB, abdominal pain, diarrhea, constipation, night sweats. She is sleeping well and is using melatonin over the counter for sleep which helped well.   Review of Systems  Constitutional: Negative.   HENT: Negative.   Eyes: Negative.   Respiratory: Negative for cough, chest tightness and shortness of breath.   Cardiovascular: Negative for chest pain, palpitations and leg swelling.  Gastrointestinal: Negative for abdominal distention, abdominal pain, constipation, diarrhea, nausea and vomiting.  Musculoskeletal: Negative.   Skin: Negative.   Neurological: Positive for tremors and seizures.  Psychiatric/Behavioral: Negative.     Objective:  Physical Exam Constitutional:      Appearance: She is well-developed. She is obese.  HENT:     Head: Normocephalic and atraumatic.  Neck:     Musculoskeletal: Normal range of motion.  Cardiovascular:     Rate and Rhythm: Normal rate and regular rhythm.  Pulmonary:     Effort: Pulmonary effort is normal. No respiratory distress.     Breath sounds: Normal breath sounds. No wheezing or rales.  Abdominal:    General: Bowel sounds are normal. There is no distension.     Palpations: Abdomen is soft.     Tenderness: There is no abdominal tenderness. There is no rebound.  Skin:    General: Skin is warm and dry.  Neurological:     Mental Status: She is alert and oriented to person, place, and time.     Coordination: Coordination normal.     Vitals:   06/19/18 0951  BP: 130/88  Pulse: 87  Temp: 98.3 F (36.8 C)  TempSrc: Oral  SpO2: 98%  Weight: 248 lb (112.5 kg)  Height: 5\' 6"  (1.676 m)    Assessment & Plan:  Visit time 25 minutes: greater than 50% of that time was spent in face to face counseling and coordination of care with the patient: counseled about the nature of pseudoseizures as well as appropriate follow up and care and having her to document episodes to help link triggers as well as seeing psych to help identify and treat these.

## 2018-06-19 NOTE — Telephone Encounter (Signed)
Noted old message patient was seen today in the office

## 2018-06-19 NOTE — Telephone Encounter (Signed)
Patient was seen in the office today

## 2018-06-19 NOTE — Telephone Encounter (Signed)
just making sure I am correct Patient was taken off this and put on buspar at visit.

## 2018-06-19 NOTE — Telephone Encounter (Signed)
Should not be refilled. Was taken off.

## 2018-06-19 NOTE — Patient Instructions (Signed)
We have sent in buspar to take in the morning every day.   You can take it up to 3 times per day if needed if you feel bad.   Depending on how you do with this we may change the dosing some.   We will work on getting you in with a psychiatrist to help see what we can do long term to get rid of this.

## 2018-06-19 NOTE — Assessment & Plan Note (Addendum)
She has stopped taking hydroxyzine and did not feel it was helping anymore and felt it was worsening symptoms. She does not have identifiable trigger. Denies any anxiety or depression component or major life changes. Referral to psych and talked with her about counseling as a possible helper for her symptoms to manage them. She knows not to drive. Rx for buspar to take daily and up to TID prn to see if this helps. Checking labs to make sure no metabolic trigger including thyroid, B12, vitamin D, iron.

## 2018-06-21 LAB — VITAMIN B12: VITAMIN B 12: 627 pg/mL (ref 211–911)

## 2018-07-25 ENCOUNTER — Telehealth: Payer: Self-pay | Admitting: Internal Medicine

## 2018-07-25 NOTE — Telephone Encounter (Signed)
Called patient back and patient states she did not receive a call ar else she would have not gotten a hold of [place]. She will call them to make an appointment

## 2018-07-25 NOTE — Telephone Encounter (Signed)
Copied from CRM [id number]. Topic: Quick Communication - Rx Refill/Question >> Jul 25, 2018 10:21 AM Burchel, Abbi R wrote: Medication: busPIRone (BUSPAR) 5 MG tablet  Pt states she was instructed to call back if she needed to increase dosage.  Pt states she is still having seizures.  Pt: [phone]  Preferred [facility]acy: [facility] [address], [place] - [address] [place] [address] Phone: [phone] Fax: [phone] Not a 24 hour pharmacy; exact hours not known.    Pt was advised that RX refills may take up to 3 business days. We ask that you follow-up with your pharmacy.

## 2018-07-25 NOTE — Telephone Encounter (Signed)
Patient phoned to report she continues to have up to 5-10 seizures per day. She is taking Buspar 5 MG tabs three times daily and feels she needs an increase. Per LOV 06/19/18, she was to update and inform Dr. Okey Dupre on her seizure activity if they continued on the Buspar so that a change in dosage could be considered if needed. Routing encounter to PCP.

## 2018-07-25 NOTE — Telephone Encounter (Signed)
We need her to call back the psychiatrist office to make a visit with them. They called her and she did not respond. They can help her with this. I don't think that increasing will help.

## 2019-01-08 ENCOUNTER — Other Ambulatory Visit: Payer: Self-pay | Admitting: Internal Medicine

## 2019-04-05 IMAGING — DX DG CHEST 2V
2 series · 2 of 2 positions shown · non-contrast
Comparison: April 28, 2014

CLINICAL DATA: Dyspnea on exertion for 10 days.

EXAM:
CHEST - 2 VIEW

[chest pa]
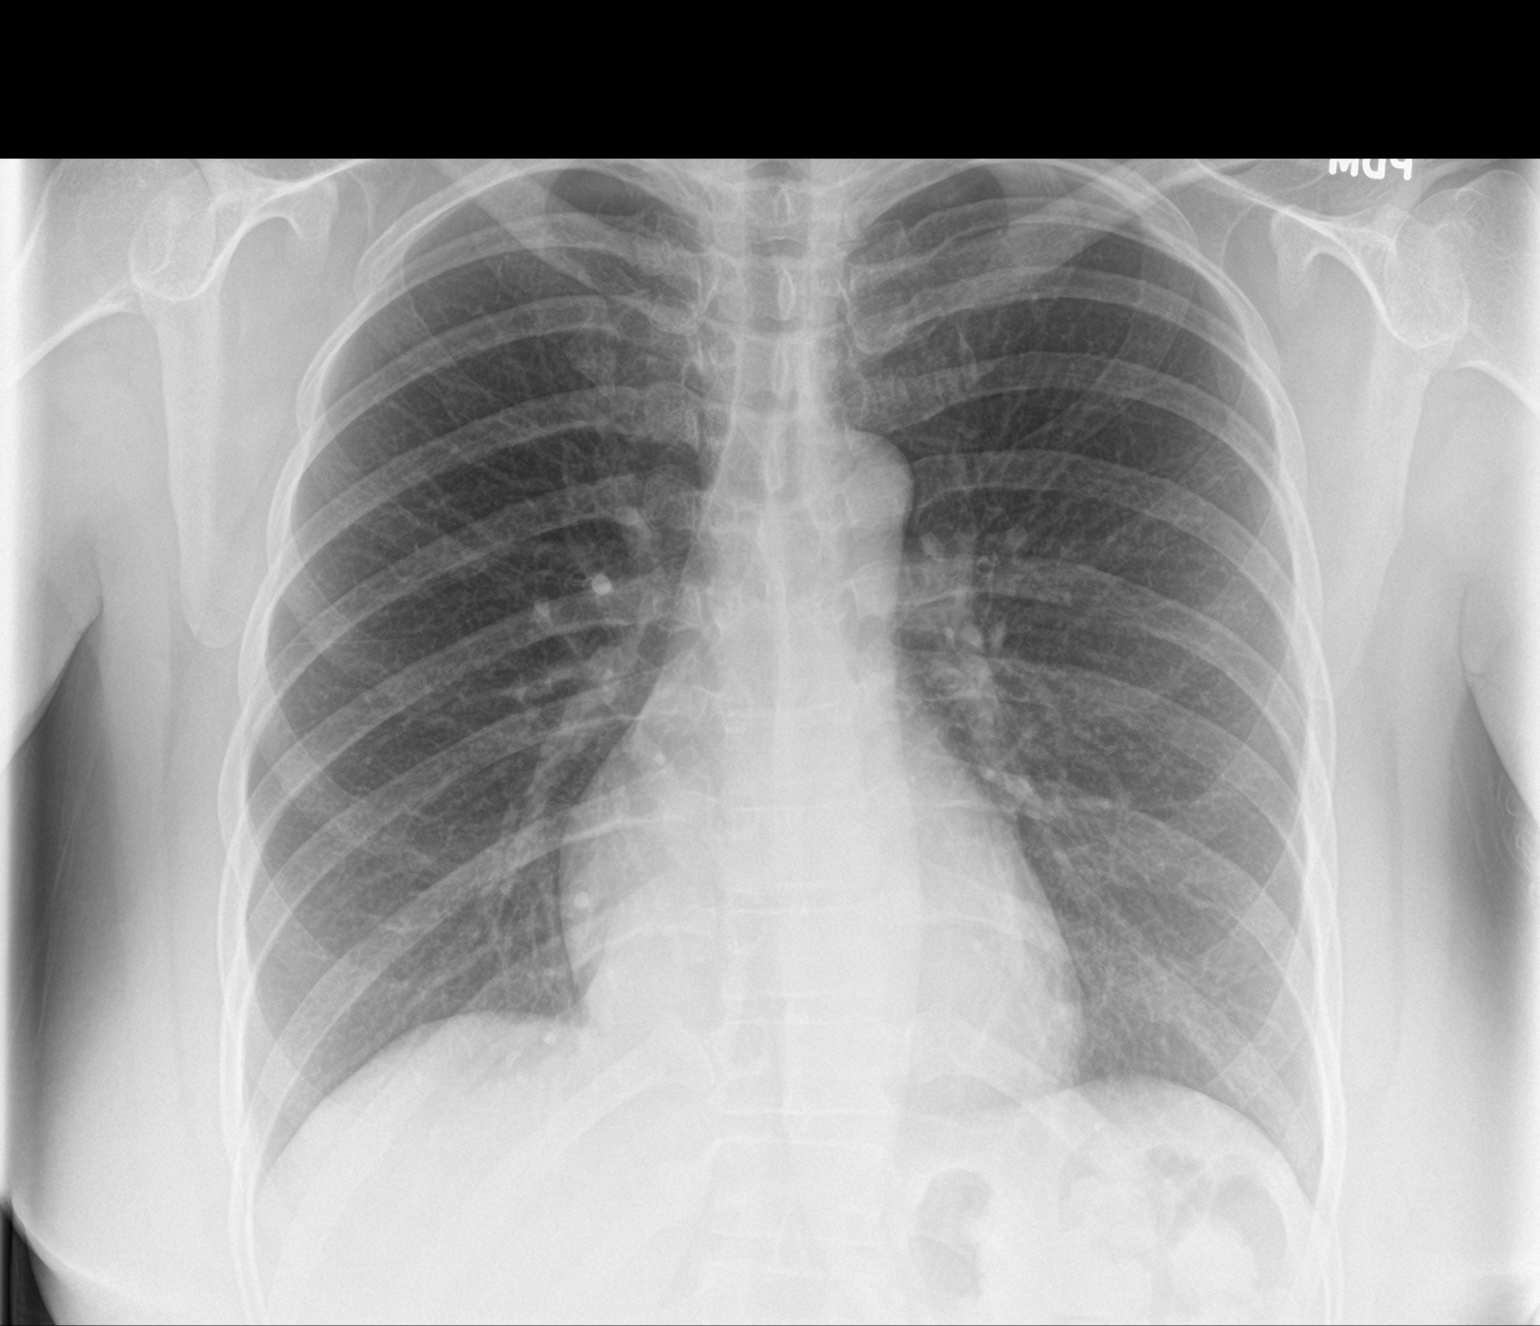

[chest lat]
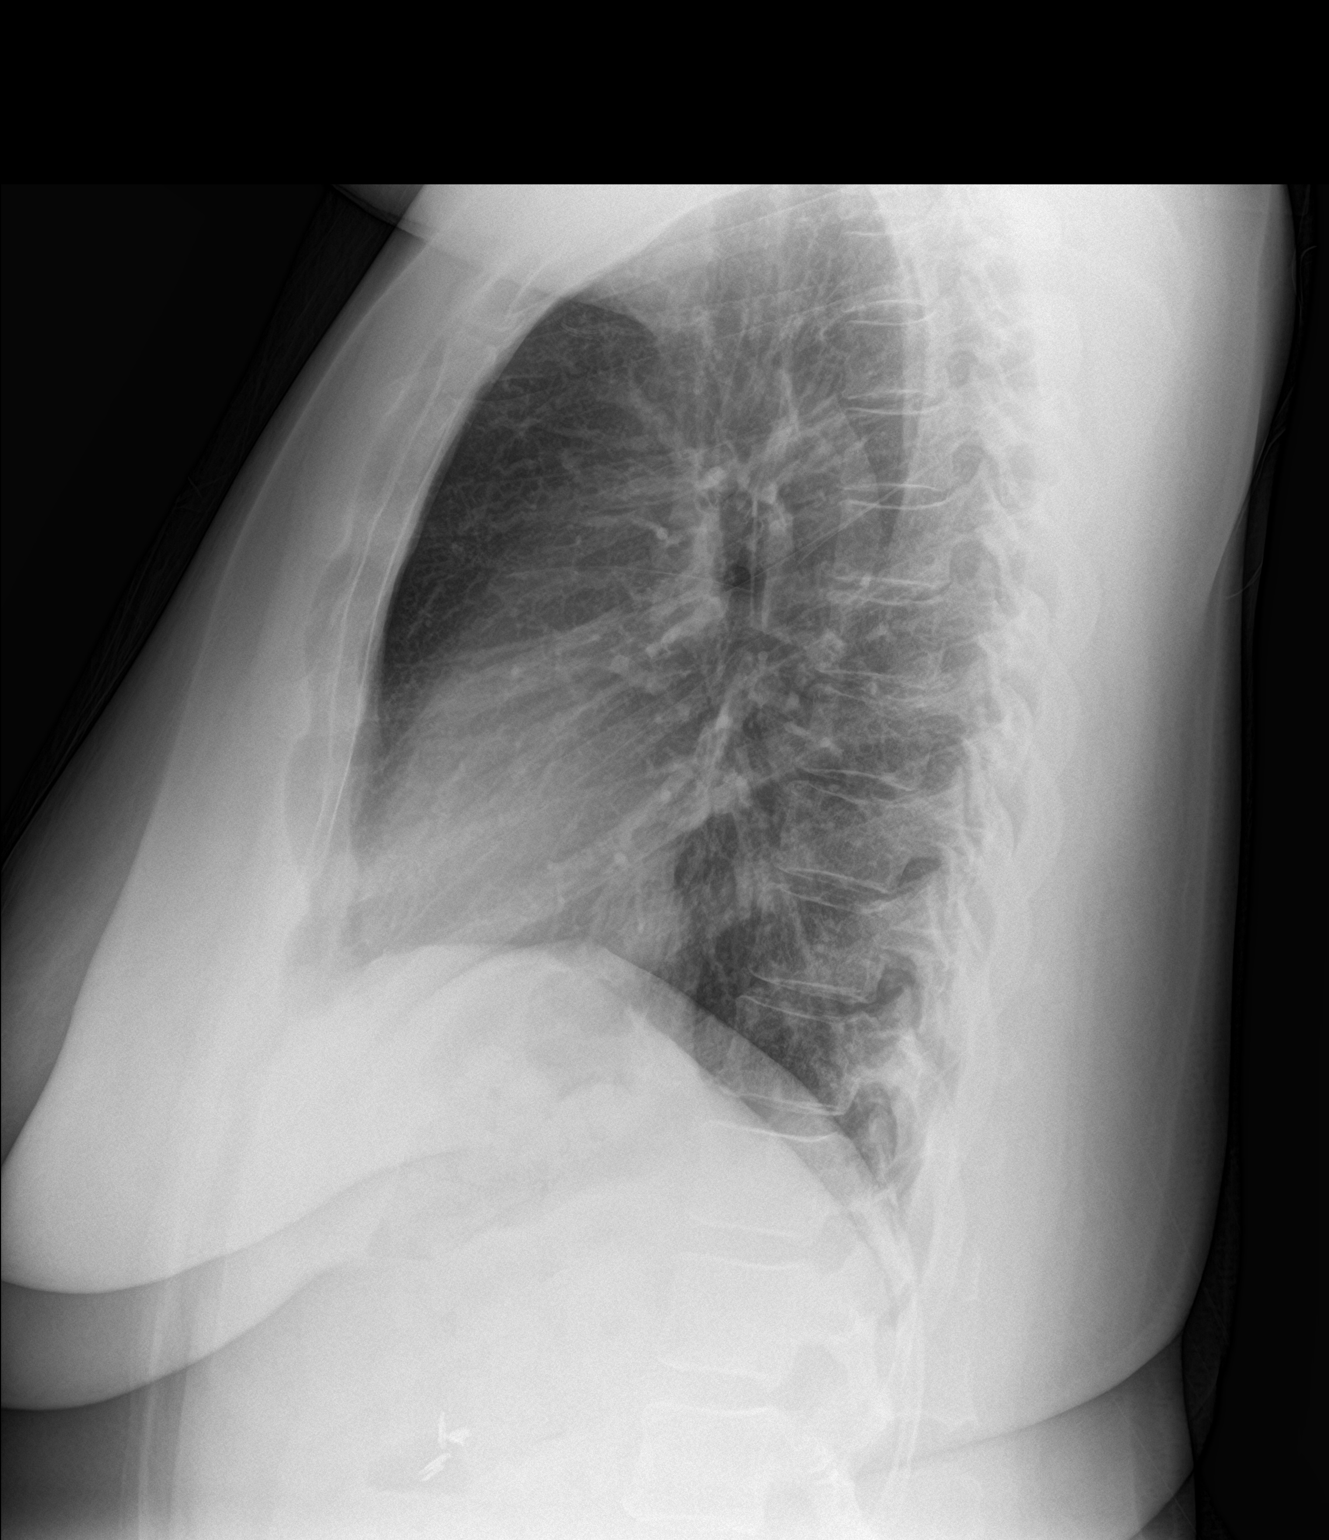

[2 of 2 positions shown; findings below may reference images not displayed]

FINDINGS: The heart size and mediastinal contours are within normal limits.
Both lungs are clear. The visualized skeletal structures are
unremarkable.
IMPRESSION: No active cardiopulmonary disease.

## 2019-09-13 ENCOUNTER — Other Ambulatory Visit: Payer: Self-pay

## 2019-09-13 ENCOUNTER — Encounter: Payer: Self-pay | Admitting: Internal Medicine

## 2019-09-13 ENCOUNTER — Ambulatory Visit (INDEPENDENT_AMBULATORY_CARE_PROVIDER_SITE_OTHER): Payer: No Typology Code available for payment source | Admitting: Internal Medicine

## 2019-09-13 VITALS — BP 142/90 | HR 97 | Temp 99.0°F | Ht 66.0 in | Wt 246.0 lb

## 2019-09-13 DIAGNOSIS — J4540 Moderate persistent asthma, uncomplicated: Secondary | ICD-10-CM | POA: Diagnosis not present

## 2019-09-13 DIAGNOSIS — Z0000 Encounter for general adult medical examination without abnormal findings: Secondary | ICD-10-CM

## 2019-09-13 DIAGNOSIS — F445 Conversion disorder with seizures or convulsions: Secondary | ICD-10-CM

## 2019-09-13 DIAGNOSIS — T7800XA Anaphylactic reaction due to unspecified food, initial encounter: Secondary | ICD-10-CM | POA: Diagnosis not present

## 2019-09-13 DIAGNOSIS — K219 Gastro-esophageal reflux disease without esophagitis: Secondary | ICD-10-CM

## 2019-09-13 LAB — TSH: TSH: 0.93 u[IU]/mL (ref 0.35–4.50)

## 2019-09-13 LAB — COMPREHENSIVE METABOLIC PANEL
ALT: 11 U/L (ref 0–35)
AST: 16 U/L (ref 0–37)
Albumin: 4.2 g/dL (ref 3.5–5.2)
Alkaline Phosphatase: 86 U/L (ref 39–117)
BUN: 8 mg/dL (ref 6–23)
CO2: 26 mEq/L (ref 19–32)
Calcium: 9.5 mg/dL (ref 8.4–10.5)
Chloride: 106 mEq/L (ref 96–112)
Creatinine, Ser: 0.8 mg/dL (ref 0.40–1.20)
GFR: 90.85 mL/min (ref >60.00)
Glucose, Bld: 121 mg/dL — ABNORMAL HIGH (ref 70–99)
Potassium: 3.4 mEq/L — ABNORMAL LOW (ref 3.5–5.1)
Sodium: 141 mEq/L (ref 135–145)
Total Bilirubin: 0.4 mg/dL (ref 0.2–1.2)
Total Protein: 7.7 g/dL (ref 6.0–8.3)

## 2019-09-13 LAB — CBC
HCT: 41.6 % (ref 36.0–46.0)
Hemoglobin: 14.1 g/dL (ref 12.0–15.0)
MCHC: 34 g/dL (ref 30.0–36.0)
MCV: 88.4 fl (ref 78.0–100.0)
Platelets: 224 10*3/uL (ref 150.0–400.0)
RBC: 4.71 Mil/uL (ref 3.87–5.11)
RDW: 13.3 % (ref 11.5–15.5)
WBC: 5.3 10*3/uL (ref 4.0–10.5)

## 2019-09-13 LAB — LIPID PANEL
Cholesterol: 187 mg/dL (ref 0–200)
HDL: 37.2 mg/dL — ABNORMAL LOW (ref >39.00)
LDL Cholesterol: 122 mg/dL — ABNORMAL HIGH (ref 0–99)
NonHDL: 150.14
Total CHOL/HDL Ratio: 5
Triglycerides: 139 mg/dL (ref 0.0–149.0)
VLDL: 27.8 mg/dL (ref 0.0–40.0)

## 2019-09-13 LAB — HEMOGLOBIN A1C: Hgb A1c MFr Bld: 6.4 % (ref 4.6–6.5)

## 2019-09-13 LAB — VITAMIN D 25 HYDROXY (VIT D DEFICIENCY, FRACTURES): VITD: 26.56 ng/mL — ABNORMAL LOW (ref 30.00–100.00)

## 2019-09-13 LAB — VITAMIN B12: Vitamin B-12: 728 pg/mL (ref 211–911)

## 2019-09-13 MED ORDER — MONTELUKAST SODIUM 10 MG PO TABS: 10.0000 mg | ORAL_TABLET | Freq: Every day | ORAL | 3 refills | Status: DC

## 2019-09-13 MED ORDER — FLUTICASONE PROPIONATE 50 MCG/ACT NA SUSP: 2.0000 | Freq: Every day | NASAL | 4 refills | Status: DC

## 2019-09-13 MED ORDER — ALBUTEROL SULFATE HFA 108 (90 BASE) MCG/ACT IN AERS: 2.0000 | INHALATION_SPRAY | Freq: Four times a day (QID) | RESPIRATORY_TRACT | 6 refills | Status: DC | PRN

## 2019-09-13 MED ORDER — PANTOPRAZOLE SODIUM 40 MG PO TBEC: 40.0000 mg | DELAYED_RELEASE_TABLET | Freq: Every day | ORAL | 3 refills | Status: DC

## 2019-09-13 MED ORDER — EPINEPHRINE 0.3 MG/0.3ML IJ SOAJ: 0.3000 mg | Freq: Once | INTRAMUSCULAR | 0 refills | Status: DC

## 2019-09-13 MED ORDER — BUDESONIDE-FORMOTEROL FUMARATE 160-4.5 MCG/ACT IN AERO: INHALATION_SPRAY | RESPIRATORY_TRACT | 5 refills | Status: DC

## 2019-09-13 NOTE — Assessment & Plan Note (Signed)
Refill protonix 

## 2019-09-13 NOTE — Assessment & Plan Note (Signed)
No real significant episodes and not taking buspar any longer. Okay to drive medically as it has been appropriate amount of time since episodes.

## 2019-09-13 NOTE — Assessment & Plan Note (Signed)
Refill epi pen today. Talked with her extensively about covid-19 vaccine and risk.

## 2019-09-13 NOTE — Assessment & Plan Note (Signed)
Refill symbicort and albuterol and protonix and singulair. No flare today. Has nebulizer at home still but has not used in a few years. Will leave albuterol solution on list in case needed in future.

## 2019-09-13 NOTE — Progress Notes (Signed)
Subjective:   Patient ID: Megan Wilkerson, female    DOB: 1965-08-19, 54 y.o.   MRN: [id number]  HPI The patient is a 54 YO female coming in for physical. 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 Lost husband to covid-19 last April. Coping okay but still struggling with that.   PMH, [facility], social history reviewed and updated.   Review of Systems  Constitutional: Negative.   HENT: Negative.   Eyes: Negative.   Respiratory: Negative for cough, chest tightness and shortness of breath.   Cardiovascular: Negative for chest pain, palpitations and leg swelling.  Gastrointestinal: Negative for abdominal distention, abdominal pain, constipation, diarrhea, nausea and vomiting.  Musculoskeletal: Negative.   Skin: Negative.   Neurological: Positive for numbness.       Right carpal tunnel  Psychiatric/Behavioral: Negative.     Objective:  Physical Exam Constitutional:      Appearance: She is well-developed. She is obese.  HENT:     Head: Normocephalic and atraumatic.  Cardiovascular:     Rate and Rhythm: Normal rate and regular rhythm.  Pulmonary:     Effort: Pulmonary effort is normal. No respiratory distress.     Breath sounds: Normal breath sounds. No wheezing or rales.  Abdominal:     General: Bowel sounds are normal. There is no distension.     Palpations: Abdomen is soft.     Tenderness: There is no abdominal tenderness. There is no rebound.  Musculoskeletal:     Cervical back: Normal range of motion.  Skin:    General: Skin is warm and dry.  Neurological:     Mental Status: She is alert and oriented to person, place, and time.     Coordination: Coordination normal.     Vitals:   09/13/19 0955 09/13/19 1056  BP: (!) 150/100 (!) 142/90  Pulse: 97   Temp: 99 F (37.2 C)   TempSrc: Oral   SpO2: 98%   Weight: 246 lb (111.6 kg)   Height: 5\' 6"  (1.676 m)     This visit occurred during the SARS-CoV-2 public health emergency.  Safety protocols were in place, including  screening questions prior to the visit, additional usage of staff PPE, and extensive cleaning of exam room while observing appropriate contact time as indicated for disinfecting solutions.   Assessment & Plan:

## 2019-09-13 NOTE — Patient Instructions (Addendum)
We have sent in all the refills.    Health Maintenance, Female Adopting a healthy lifestyle and getting preventive care are important in promoting health and wellness. Ask your health care provider about:  The right schedule for you to have regular tests and exams.  Things you can do on your own to prevent diseases and keep yourself healthy. What should I know about diet, weight, and exercise? Eat a healthy diet   Eat a diet that includes plenty of vegetables, fruits, low-fat dairy products, and lean protein.  Do not eat a lot of foods that are high in solid fats, added sugars, or sodium. Maintain a healthy weight Body mass index (BMI) is used to identify weight problems. It estimates body fat based on height and weight. Your health care provider can help determine your BMI and help you achieve or maintain a healthy weight. Get regular exercise Get regular exercise. This is one of the most important things you can do for your health. Most adults should:  Exercise for at least 150 minutes each week. The exercise should increase your heart rate and make you sweat (moderate-intensity exercise).  Do strengthening exercises at least twice a week. This is in addition to the moderate-intensity exercise.  Spend less time sitting. Even light physical activity can be beneficial. Watch cholesterol and blood lipids Have your blood tested for lipids and cholesterol at 53 years of age, then have this test every 5 years. Have your cholesterol levels checked more often if:  Your lipid or cholesterol levels are high.  You are older than 53 years of age.  You are at high risk for heart disease. What should I know about cancer screening? Depending on your health history and family history, you may need to have cancer screening at various ages. This may include screening for:  Breast cancer.  Cervical cancer.  Colorectal cancer.  Skin cancer.  Lung cancer. What should I know about heart  disease, diabetes, and high blood pressure? Blood pressure and heart disease  High blood pressure causes heart disease and increases the risk of stroke. This is more likely to develop in people who have high blood pressure readings, are of African descent, or are overweight.  Have your blood pressure checked: ? Every 3-5 years if you are 49-62 years of age. ? Every year if you are 30 years old or older. Diabetes Have regular diabetes screenings. This checks your fasting blood sugar level. Have the screening done:  Once every three years after age 43 if you are at a normal weight and have a low risk for diabetes.  More often and at a younger age if you are overweight or have a high risk for diabetes. What should I know about preventing infection? Hepatitis B If you have a higher risk for hepatitis B, you should be screened for this virus. Talk with your health care provider to find out if you are at risk for hepatitis B infection. Hepatitis C Testing is recommended for:  Everyone born from [birth year] through 1964.  Anyone with known risk factors for hepatitis C. Sexually transmitted infections (STIs)  Get screened for STIs, including gonorrhea and chlamydia, if: ? You are sexually active and are younger than 53 years of age. ? You are older than 53 years of age and your health care provider tells you that you are at risk for this type of infection. ? Your sexual activity has changed since you were last screened, and you are at increased risk  for chlamydia or gonorrhea. Ask your health care provider if you are at risk.  Ask your health care provider about whether you are at high risk for HIV. Your health care provider may recommend a prescription medicine to help prevent HIV infection. If you choose to take medicine to prevent HIV, you should first get tested for HIV. You should then be tested every 3 months for as long as you are taking the medicine. Pregnancy  If you are about to stop  having your period (premenopausal) and you may become pregnant, seek counseling before you get pregnant.  Take 400 to 800 micrograms (mcg) of folic acid every day if you become pregnant.  Ask for birth control (contraception) if you want to prevent pregnancy. Osteoporosis and menopause Osteoporosis is a disease in which the bones lose minerals and strength with aging. This can result in bone fractures. If you are 62 years old or older, or if you are at risk for osteoporosis and fractures, ask your health care provider if you should:  Be screened for bone loss.  Take a calcium or vitamin D supplement to lower your risk of fractures.  Be given hormone replacement therapy (HRT) to treat symptoms of menopause. Follow these instructions at home: Lifestyle  Do not use any products that contain nicotine or tobacco, such as cigarettes, e-cigarettes, and chewing tobacco. If you need help quitting, ask your health care provider.  Do not use street drugs.  Do not share needles.  Ask your health care provider for help if you need support or information about quitting drugs. Alcohol use  Do not drink alcohol if: ? Your health care provider tells you not to drink. ? You are pregnant, may be pregnant, or are planning to become pregnant.  If you drink alcohol: ? Limit how much you use to 0-1 drink a day. ? Limit intake if you are breastfeeding.  Be aware of how much alcohol is in your drink. In the [place] one drink equals one 12 oz bottle of beer (355 mL), one 5 oz glass of wine (148 mL), or one 1 oz glass of hard liquor (44 mL). General instructions  Schedule regular health, dental, and eye exams.  Stay current with your vaccines.  Tell your health care provider if: ? You often feel depressed. ? You have ever been abused or do not feel safe at home. Summary  Adopting a healthy lifestyle and getting preventive care are important in promoting health and wellness.  Follow your health  care provider's instructions about healthy diet, exercising, and getting tested or screened for diseases.  Follow your health care provider's instructions on monitoring your cholesterol and blood pressure. This information is not intended to replace advice given to you by your health care provider. Make sure you discuss any questions you have with your health care provider. Document Revised: 05/09/2018 Document Reviewed: 05/09/2018 Elsevier Patient Education  2020 [employer].

## 2019-09-13 NOTE — Assessment & Plan Note (Signed)
Flu shot declines. Shingrix declines. Tetanus up to date. Covid-19 still thinking about due to past anaphylaxis with foods. Colonoscopy GI referral placed. Mammogram advised to have done given restrictions around covid-19 vaccination if she elects to get that, pap smear with gyn. Counseled about sun safety and mole surveillance. Counseled about the dangers of distracted driving. Given 10 year screening recommendations.

## 2019-09-16 ENCOUNTER — Encounter: Payer: Self-pay | Admitting: Internal Medicine

## 2019-10-10 ENCOUNTER — Telehealth: Payer: Self-pay

## 2019-10-10 ENCOUNTER — Ambulatory Visit (AMBULATORY_SURGERY_CENTER): Payer: Self-pay

## 2019-10-10 ENCOUNTER — Other Ambulatory Visit: Payer: Self-pay

## 2019-10-10 VITALS — Temp 97.3°F | Ht 66.0 in | Wt 242.0 lb

## 2019-10-10 DIAGNOSIS — Z01818 Encounter for other preprocedural examination: Secondary | ICD-10-CM

## 2019-10-10 DIAGNOSIS — Z1211 Encounter for screening for malignant neoplasm of colon: Secondary | ICD-10-CM

## 2019-10-10 MED ORDER — SUTAB 1479-225-188 MG PO TABS: 12.0000 | ORAL_TABLET | ORAL | 0 refills | Status: DC

## 2019-10-10 NOTE — Progress Notes (Signed)
No egg allergy known to patient   PT HAS SOY ALLERGY STATE FROM EATING SOY AND HAD COUGHING AND HAD TO TAKE BENADRYL TO RELIEVE SYMPTOMS  No issues with past sedation with any surgeries  or procedures, no intubation problems  No diet pills per patient No home 02 use per patient  No blood thinners per patient  Pt denies issues with constipation  No A fib or A flutter  EMMI video sent to pt's e mail   SUTAB COUPON AND CODE GIVEN  Due to the COVID-19 pandemic we are asking patients to follow these guidelines. Please only bring one care partner. Please be aware that your care partner may wait in the car in the parking lot or if they feel like they will be too hot to wait in the car, they may wait in the lobby on the 4th floor. All care partners are required to wear a mask the entire time (we do not have any that we can provide them), they need to practice social distancing, and we will do a Covid check for all patient's and care partners when you arrive. Also we will check their temperature and your temperature. If the care partner waits in their car they need to stay in the parking lot the entire time and we will call them on their cell phone when the patient is ready for discharge so they can bring the car to the front of the building. Also all patient's will need to wear a mask into building.

## 2019-10-14 NOTE — Telephone Encounter (Signed)
thanks

## 2019-10-17 ENCOUNTER — Telehealth: Payer: Self-pay | Admitting: Internal Medicine

## 2019-10-17 DIAGNOSIS — Z1211 Encounter for screening for malignant neoplasm of colon: Secondary | ICD-10-CM

## 2019-10-17 MED ORDER — SUTAB 1479-225-188 MG PO TABS: 24.0000 | ORAL_TABLET | ORAL | 0 refills | Status: DC

## 2019-10-17 NOTE — Telephone Encounter (Signed)
Pt called stating that her pharmacy has not received prescription for sutab. She spoke with her pharmacy today and was told that nothing was received. Pls send it again.

## 2019-10-17 NOTE — Addendum Note (Signed)
Addended by: Gillermina Hu on: 10/17/2019 02:09 PM   Modules accepted: Orders

## 2019-10-17 NOTE — Telephone Encounter (Signed)
Sutab with coupon code sent to pt's  pharmacy today  Hilda Lias pv

## 2019-10-21 ENCOUNTER — Other Ambulatory Visit: Payer: Self-pay | Admitting: Internal Medicine

## 2019-10-21 ENCOUNTER — Other Ambulatory Visit: Payer: Self-pay

## 2019-10-21 ENCOUNTER — Ambulatory Visit (INDEPENDENT_AMBULATORY_CARE_PROVIDER_SITE_OTHER): Payer: Self-pay

## 2019-10-21 DIAGNOSIS — Z1159 Encounter for screening for other viral diseases: Secondary | ICD-10-CM

## 2019-10-21 LAB — SARS CORONAVIRUS 2 (TAT 6-24 HRS): SARS Coronavirus 2: NEGATIVE

## 2019-10-24 ENCOUNTER — Other Ambulatory Visit: Payer: Self-pay

## 2019-10-24 ENCOUNTER — Encounter: Payer: Self-pay | Admitting: Internal Medicine

## 2019-10-24 ENCOUNTER — Ambulatory Visit (AMBULATORY_SURGERY_CENTER): Payer: No Typology Code available for payment source | Admitting: Internal Medicine

## 2019-10-24 VITALS — BP 122/72 | HR 65 | Temp 96.9°F | Resp 14 | Ht 66.0 in | Wt 242.0 lb

## 2019-10-24 DIAGNOSIS — Z1211 Encounter for screening for malignant neoplasm of colon: Secondary | ICD-10-CM

## 2019-10-24 MED ORDER — SODIUM CHLORIDE 0.9 % IV SOLN: 500.0000 mL | Freq: Once | INTRAVENOUS | Status: DC

## 2019-10-24 NOTE — Progress Notes (Signed)
Pt's states no medical or surgical changes since previsit or office visit. 

## 2019-10-24 NOTE — Patient Instructions (Signed)
YOU HAD AN ENDOSCOPIC PROCEDURE TODAY AT THE [facility]:   Refer to the procedure report that was given to you for any specific questions about what was found during the examination.  If the procedure report does not answer your questions, please call your gastroenterologist to clarify.  If you requested that your care partner not be given the details of your procedure findings, then the procedure report has been included in a sealed envelope for you to review at your convenience later.  YOU SHOULD EXPECT: Some feelings of bloating in the abdomen. Passage of more gas than usual.  Walking can help get rid of the air that was put into your GI tract during the procedure and reduce the bloating. If you had a lower endoscopy (such as a colonoscopy or flexible sigmoidoscopy) you may notice spotting of blood in your stool or on the toilet paper. If you underwent a bowel prep for your procedure, you may not have a normal bowel movement for a few days.  Please Note:  You might notice some irritation and congestion in your nose or some drainage.  This is from the oxygen used during your procedure.  There is no need for concern and it should clear up in a day or so.  SYMPTOMS TO REPORT IMMEDIATELY:   Following lower endoscopy (colonoscopy or flexible sigmoidoscopy):  Excessive amounts of blood in the stool  Significant tenderness or worsening of abdominal pains  Swelling of the abdomen that is new, acute  Fever of 100F or higher   For urgent or emergent issues, a gastroenterologist can be reached at any hour by calling [phone]. Do not use MyChart messaging for urgent concerns.    DIET:  We do recommend a small meal at first, but then you may proceed to your regular diet.  Drink plenty of fluids but you should avoid alcoholic beverages for 24 hours.  ACTIVITY:  You should plan to take it easy for the rest of today and you should NOT DRIVE or use heavy machinery until tomorrow (because  of the sedation medicines used during the test).    MEDICATIONS: Continue present medications.  Please see handouts given to you by your recovery nurse.  FOLLOW UP: Our staff will call the number listed on your records 48-72 hours following your procedure to check on you and address any questions or concerns that you may have regarding the information given to you following your procedure. If we do not reach you, we will leave a message.  We will attempt to reach you two times.  During this call, we will ask if you have developed any symptoms of COVID 19. If you develop any symptoms (ie: fever, flu-like symptoms, shortness of breath, cough etc.) before then, please call [phone].  If you test positive for Covid 19 in the 2 weeks post procedure, please call and report this information to us.    If any biopsies were taken you will be contacted by phone or by letter within the next 1-3 weeks.  Please call us at [phone] if you have not heard about the biopsies in 3 weeks.   Thank you for allowing us to provide for your healthcare needs today.   SIGNATURES/CONFIDENTIALITY: You and/or your care partner have signed paperwork which will be entered into your electronic medical record.  These signatures attest to the fact that that the information above on your After Visit Summary has been reviewed and is understood.  Full responsibility of the   confidentiality of this discharge information lies with you and/or your care-partner. 

## 2019-10-24 NOTE — Progress Notes (Signed)
Report to PACU, RN, vss, BBS= Clear.  

## 2019-10-24 NOTE — Op Note (Signed)
[facility] Patient Name: Megan Wilkerson Procedure Date: 10/24/2019 9:11 AM MRN: [id number] Endoscopist: Beverley Fiedler , MD Age: 54 Referring MD:  Date of Birth: 1965-07-20 Gender: Female Account #: [id number] Procedure:                Colonoscopy Indications:              Screening for colorectal malignant neoplasm, This                            is the patient's first colonoscopy Medicines:                Monitored Anesthesia Care Procedure:                Pre-Anesthesia Assessment:                           - Prior to the procedure, a History and Physical                            was performed, and patient medications and                            allergies were reviewed. The patient's tolerance of                            previous anesthesia was also reviewed. The risks                            and benefits of the procedure and the sedation                            options and risks were discussed with the patient.                            All questions were answered, and informed consent                            was obtained. Prior Anticoagulants: The patient has                            taken no previous anticoagulant or antiplatelet                            agents. ASA Grade Assessment: II - A patient with                            mild systemic disease. After reviewing the risks                            and benefits, the patient was deemed in                            satisfactory condition to undergo the procedure.  After obtaining informed consent, the colonoscope                            was passed under direct vision. Throughout the                            procedure, the patient's blood pressure, pulse, and                            oxygen saturations were monitored continuously. The                            Colonoscope was introduced through the anus and                            advanced to the cecum,  identified by appendiceal                            orifice and ileocecal valve. The colonoscopy was                            performed without difficulty. The patient tolerated                            the procedure well. The quality of the bowel                            preparation was good. The ileocecal valve,                            appendiceal orifice, and rectum were photographed. Scope In: 9:18:15 AM Scope Out: 9:29:16 AM Scope Withdrawal Time: 0 hours 8 minutes 11 seconds  Total Procedure Duration: 0 hours 11 minutes 1 second  Findings:                 The digital rectal exam was normal.                           The entire examined colon appeared normal.                           External and internal hemorrhoids were found during                            retroflexion. The hemorrhoids were small. Complications:            No immediate complications. Estimated Blood Loss:     Estimated blood loss: none. Impression:               - The entire examined colon is normal.                           - Internal and external hemorrhoids.                           - No specimens  collected. Recommendation:           - Patient has a contact number available for                            emergencies. The signs and symptoms of potential                            delayed complications were discussed with the                            patient. Return to normal activities tomorrow.                            Written discharge instructions were provided to the                            patient.                           - Resume previous diet.                           - Continue present medications.                           - Repeat colonoscopy in 10 years for screening                            purposes. Beverley Fiedler, MD 10/24/2019 9:33:24 AM This report has been signed electronically.

## 2019-10-29 ENCOUNTER — Telehealth: Payer: Self-pay | Admitting: *Deleted

## 2019-10-29 NOTE — Telephone Encounter (Signed)
  Follow up Call-  Call back number 10/24/2019  Post procedure Call Back phone  # [phone]  Permission to leave phone message Yes  Some recent data might be hidden     Patient questions:  Do you have a fever, pain , or abdominal swelling? No. Pain Score  0 *  Have you tolerated food without any problems? Yes.    Have you been able to return to your normal activities? Yes.    Do you have any questions about your discharge instructions: Diet   No. Medications  No. Follow up visit  No.  Do you have questions or concerns about your Care? No.  Actions: * If pain score is 4 or above: No action needed, pain <4.  1. Have you developed a fever since your procedure? no  2.   Have you had an respiratory symptoms (SOB or cough) since your procedure? no  3.   Have you tested positive for COVID 19 since your procedure no  4.   Have you had any family members/close contacts diagnosed with the COVID 19 since your procedure? no   If yes to any of these questions please route to Laverna Peace, RN and Charlett Lango, RN

## 2021-05-12 NOTE — Progress Notes (Signed)
Subjective:    Patient ID: Megan Wilkerson, female    DOB: 1966-02-05, 55 y.o.   MRN: [id number]  This visit occurred during the SARS-CoV-2 public health emergency.  Safety protocols were in place, including screening questions prior to the visit, additional usage of staff PPE, and extensive cleaning of exam room while observing appropriate contact time as indicated for disinfecting solutions.    HPI The patient is here for an acute visit.  Persistent cold symptoms x 2 weeks  - she thought it was just a cold.  It is not getting better -especially the cough.  Her energy level is low.    She states chills, fatigue, nasal congestion, postnasal drainage, Chest tightness, productive cough, shortness of breath at times, wheezing, chest pain from coughing, headaches and lightheadedness.  She has been taking her inhalers-Symbicort and albuterol as needed, her allergy medications and cough syrup over-the-counter.  She does not have nebulizer solution at home.  She feels like the medications are helping, but symptoms are persistent.    Medications and allergies reviewed with patient and updated if appropriate.  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 6.7 g 6   brimonidine (ALPHAGAN) 0.15 % ophthalmic solution Place 1 drop into both eyes 2 (two) times daily.     brimonidine  (ALPHAGAN) 0.2 % ophthalmic solution 1 drop 2 (two) times daily.     budesonide-formoterol (SYMBICORT) 160-4.5 MCG/ACT inhaler Inhale two puffs twice daily to prevent cough or wheeze. Use with spacer.  Rinse, gargle, and spit after use. 1 Inhaler 5   fexofenadine (ALLEGRA) 180 MG tablet Take 180 mg by mouth daily.     fluticasone (FLONASE) 50 MCG/ACT nasal spray Place 2 sprays into both nostrils daily. 48 g 4   latanoprost (XALATAN) 0.005 % ophthalmic solution Place 1 drop into both eyes at bedtime.      montelukast (SINGULAIR) 10 MG tablet Take 1 tablet (10 mg total) by mouth daily. 90 tablet 3   Nebulizers (COMPRESSOR/NEBULIZER) MISC As directed 1 each 0   pantoprazole (PROTONIX) 40 MG tablet Take 1 tablet (40 mg total) by mouth daily. 90 tablet 3   Pyridoxine HCl (B-6 PO) Take 1 tablet by mouth daily at 12 noon.     Sodium Sulfate-Mag Sulfate-KCl (SUTAB) [phone] MG TABS Take 24 tablets by mouth as directed. BIN: [id number] PCN: CN GROUP: [id number] MEMBER ID: [id number];EPP AS CASH 24 tablet 0   EPINEPHrine 0.3 mg/0.3 mL IJ SOAJ injection Inject 0.3 mLs (0.3 mg total) into the muscle once for 1 dose. 0.3 mL 0   Current Facility-Administered Medications on File Prior to Visit  Medication Dose Route Frequency Provider Last Rate Last Admin   0.9 %  sodium chloride infusion  500 mL Intravenous Once Pyrtle, Carie Caddy, MD        Past Medical History:  Diagnosis Date   Allergy 2012  NUMEROUS ALLERGIES AND ANAPHYLACTIC REACTIONS SINCE 2012   Anaphylactic reaction    several ED visits for same since 01/2012   Anemia    IDA    Asthma    Blood transfusion without reported diagnosis 1988   AFTER  CHILDBIRTH   GERD    Glaucoma    Hives of unknown origin    chronic and recurrent urticaria since 01/2012   HYPERLIPIDEMIA, WITH LOW HDL    MIGRAINE HEADACHE    PERIPHERAL NEUROPATHY    POSITIVE PPD 1987   s/p 55mo tx   Seizures (HCC)    Sleep apnea 2018   CPAP    Past Surgical History:   Procedure Laterality Date   ABDOMINAL HYSTERECTOMY  1996   APPENDECTOMY  1992   CESAREAN SECTION  1989   CHOLECYSTECTOMY  04/22/2011   Procedure: LAPAROSCOPIC CHOLECYSTECTOMY WITH INTRAOPERATIVE CHOLANGIOGRAM;  Surgeon: Almond Lint, MD;  Location: [facility] OR;  Service: General;  Laterality: N/A;   CYST REMOVAL HAND Right 1997    Social History   Socioeconomic History   Marital status: Married    Spouse name: Daurice Shaddix   Number of children: Not on file   Years of education: Not on file   Highest education level: Not on file  Occupational History   Not on file  Tobacco Use   Smoking status: Never   Smokeless tobacco: Never  Vaping Use   Vaping Use: Never used  Substance and Sexual Activity   Alcohol use: No   Drug use: No   Sexual activity: Not on file  Other Topics Concern   Not on file  Social History Narrative   Married, lives with spouse, son and nephew   Futures trader, 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 "peanut" and other anaphylaxis food allergies   Social Determinants of Corporate investment banker Strain: Not on file  Food Insecurity: Not on file  Transportation Needs: Not on file  Physical Activity: Not on file  Stress: Not on file  Social Connections: Not on file    Family History  Problem Relation Age of Onset   Prostate cancer Father    Hypertension Father    Heart disease Father    Diabetes Mother    Diabetes Brother    Alcohol abuse Other    Arthritis Other    Hypertension Other    Seizures Sister    Colon cancer Neg Hx    Colon polyps Neg Hx    Esophageal cancer Neg Hx    Rectal cancer Neg Hx    Stomach cancer Neg Hx     Review of Systems  Constitutional:  Positive for chills and fatigue. Negative for fever.  HENT:  Positive for congestion, ear pain (resolved), postnasal drip and sinus pressure (when it first started). Negative for sinus pain and sore throat.   Respiratory:  Positive for cough (productive), chest tightness (over the  weekend), shortness of breath (sometimes) and wheezing.   Cardiovascular:  Positive for chest pain (from coughing).  Gastrointestinal:  Positive for diarrhea (resolved) and nausea (resolved).  Musculoskeletal:  Positive for myalgias (improved).  Neurological:  Positive for light-headedness and headaches.      Objective:   Vitals:   05/13/21 0856  BP: 120/82  Pulse: 78  Temp: 98.2 F (36.8 C)  SpO2: 96%   BP Readings from Last 3 Encounters:  05/13/21 120/82  10/24/19 122/72  09/13/19 (!) 142/90   Wt Readings from Last 3 Encounters:  05/13/21  238 lb 6.4 oz (108.1 kg)  10/24/19 242 lb (109.8 kg)  10/10/19 242 lb (109.8 kg)   Body mass index is 38.48 kg/m.   Physical Exam    GENERAL APPEARANCE: Appears stated age, well appearing, NAD EYES: conjunctiva clear, no icterus HENT: bilateral tympanic membranes and ear canals normal, oropharynx with no erythema or exudates, trachea midline, no cervical or supraclavicular lymphadenopathy LUNGS: Unlabored breathing, good air entry bilaterally, clear to auscultation without wheeze or crackles CARDIOVASCULAR: Normal S1,S2 , no edema SKIN: Warm, dry      Assessment & Plan:    Asthmatic bronchitis with acute exacerbation-mild, persistent: Acute Symptomatic for 2 weeks-some of her cold symptoms have resolved, but she is still having productive cough, some shortness of breath, wheezing and chest tightness at times Continue Symbicort daily, albuterol inhaler as needed-we will prescribe albuterol solution for nebulizer treatment Medrol Dosepak Doxycycline 100 mg twice daily x10 days Tussionex cough syrup and Tessalon Perles for during the day  Rest, fluids Call if there is no improvement

## 2021-05-13 ENCOUNTER — Encounter: Payer: Self-pay | Admitting: Internal Medicine

## 2021-05-13 ENCOUNTER — Ambulatory Visit (INDEPENDENT_AMBULATORY_CARE_PROVIDER_SITE_OTHER): Payer: No Typology Code available for payment source | Admitting: Internal Medicine

## 2021-05-13 ENCOUNTER — Other Ambulatory Visit: Payer: Self-pay

## 2021-05-13 VITALS — BP 120/82 | HR 78 | Temp 98.2°F | Ht 66.0 in | Wt 238.4 lb

## 2021-05-13 DIAGNOSIS — J4531 Mild persistent asthma with (acute) exacerbation: Secondary | ICD-10-CM | POA: Diagnosis not present

## 2021-05-13 MED ORDER — METHYLPREDNISOLONE 4 MG PO TBPK
ORAL_TABLET | ORAL | 0 refills | Status: DC
Start: 2021-05-13 — End: 2023-03-20

## 2021-05-13 MED ORDER — HYDROCOD POLST-CPM POLST ER 10-8 MG/5ML PO SUER: 5.0000 mL | Freq: Two times a day (BID) | ORAL | 0 refills | Status: DC | PRN

## 2021-05-13 MED ORDER — DOXYCYCLINE HYCLATE 100 MG PO TABS
100.0000 mg | ORAL_TABLET | Freq: Two times a day (BID) | ORAL | 0 refills | Status: AC
Start: 2021-05-13 — End: 2021-05-23

## 2021-05-13 MED ORDER — ALBUTEROL SULFATE (2.5 MG/3ML) 0.083% IN NEBU: 2.5000 mg | INHALATION_SOLUTION | Freq: Four times a day (QID) | RESPIRATORY_TRACT | 1 refills | Status: DC | PRN

## 2021-05-13 MED ORDER — BENZONATATE 200 MG PO CAPS: 200.0000 mg | ORAL_CAPSULE | Freq: Three times a day (TID) | ORAL | 0 refills | Status: DC | PRN

## 2021-05-13 NOTE — Patient Instructions (Signed)
° ° °  Take the antibiotic and steroid as prescribed - complete the entire course.  Use the cough syrup as needed.  Continue over the counter cold medication, advil and tylenol.  Increase your fluids and rest.    Call if no improvement

## 2022-10-04 ENCOUNTER — Telehealth: Payer: Self-pay

## 2022-10-04 NOTE — Telephone Encounter (Signed)
LVM for patient to call back. AS, CMA 

## 2022-10-26 DIAGNOSIS — H401132 Primary open-angle glaucoma, bilateral, moderate stage: Secondary | ICD-10-CM | POA: Diagnosis not present

## 2022-11-09 DIAGNOSIS — H5213 Myopia, bilateral: Secondary | ICD-10-CM | POA: Diagnosis not present

## 2022-11-22 DIAGNOSIS — H401132 Primary open-angle glaucoma, bilateral, moderate stage: Secondary | ICD-10-CM | POA: Diagnosis not present

## 2022-12-23 ENCOUNTER — Telehealth: Payer: Self-pay | Admitting: Internal Medicine

## 2022-12-23 NOTE — Telephone Encounter (Signed)
Fine with me

## 2022-12-23 NOTE — Telephone Encounter (Signed)
Pt would like to transfer to dr [place]

## 2023-01-06 NOTE — Telephone Encounter (Signed)
Fine with me

## 2023-01-09 NOTE — Telephone Encounter (Signed)
Pt has been sch for 04-05-2023

## 2023-01-12 DIAGNOSIS — H524 Presbyopia: Secondary | ICD-10-CM | POA: Diagnosis not present

## 2023-03-18 ENCOUNTER — Inpatient Hospital Stay (HOSPITAL_COMMUNITY)
Admission: EM | Admit: 2023-03-18 | Discharge: 2023-03-21 | DRG: 916 | Disposition: A | Discharge status: Home / Self Care | Payer: Medicaid Other | Attending: Internal Medicine | Admitting: Internal Medicine

## 2023-03-18 DIAGNOSIS — H409 Unspecified glaucoma: Secondary | ICD-10-CM | POA: Diagnosis present

## 2023-03-18 DIAGNOSIS — E876 Hypokalemia: Secondary | ICD-10-CM | POA: Diagnosis present

## 2023-03-18 DIAGNOSIS — Z8249 Family history of ischemic heart disease and other diseases of the circulatory system: Secondary | ICD-10-CM

## 2023-03-18 DIAGNOSIS — Z79899 Other long term (current) drug therapy: Secondary | ICD-10-CM

## 2023-03-18 DIAGNOSIS — T7840XA Allergy, unspecified, initial encounter: Secondary | ICD-10-CM | POA: Diagnosis not present

## 2023-03-18 DIAGNOSIS — T781XXA Other adverse food reactions, not elsewhere classified, initial encounter: Secondary | ICD-10-CM

## 2023-03-18 DIAGNOSIS — F32A Depression, unspecified: Secondary | ICD-10-CM | POA: Diagnosis present

## 2023-03-18 DIAGNOSIS — Z6839 Body mass index (BMI) 39.0-39.9, adult: Secondary | ICD-10-CM

## 2023-03-18 DIAGNOSIS — Z87892 Personal history of anaphylaxis: Secondary | ICD-10-CM

## 2023-03-18 DIAGNOSIS — Z98891 History of uterine scar from previous surgery: Secondary | ICD-10-CM

## 2023-03-18 DIAGNOSIS — R569 Unspecified convulsions: Secondary | ICD-10-CM | POA: Diagnosis present

## 2023-03-18 DIAGNOSIS — E785 Hyperlipidemia, unspecified: Secondary | ICD-10-CM | POA: Diagnosis present

## 2023-03-18 DIAGNOSIS — R0603 Acute respiratory distress: Secondary | ICD-10-CM | POA: Diagnosis present

## 2023-03-18 DIAGNOSIS — G629 Polyneuropathy, unspecified: Secondary | ICD-10-CM | POA: Diagnosis present

## 2023-03-18 DIAGNOSIS — K219 Gastro-esophageal reflux disease without esophagitis: Secondary | ICD-10-CM | POA: Diagnosis present

## 2023-03-18 DIAGNOSIS — T783XXA Angioneurotic edema, initial encounter: Secondary | ICD-10-CM

## 2023-03-18 DIAGNOSIS — F410 Panic disorder [episodic paroxysmal anxiety] without agoraphobia: Secondary | ICD-10-CM | POA: Diagnosis present

## 2023-03-18 DIAGNOSIS — Z634 Disappearance and death of family member: Secondary | ICD-10-CM

## 2023-03-18 DIAGNOSIS — I10 Essential (primary) hypertension: Secondary | ICD-10-CM | POA: Diagnosis present

## 2023-03-18 DIAGNOSIS — F458 Other somatoform disorders: Secondary | ICD-10-CM | POA: Diagnosis present

## 2023-03-18 DIAGNOSIS — Z91010 Allergy to peanuts: Secondary | ICD-10-CM

## 2023-03-18 DIAGNOSIS — G43909 Migraine, unspecified, not intractable, without status migrainosus: Secondary | ICD-10-CM | POA: Diagnosis present

## 2023-03-18 DIAGNOSIS — T782XXA Anaphylactic shock, unspecified, initial encounter: Secondary | ICD-10-CM | POA: Diagnosis not present

## 2023-03-18 DIAGNOSIS — Z62810 Personal history of physical and sexual abuse in childhood: Secondary | ICD-10-CM

## 2023-03-18 DIAGNOSIS — Z7951 Long term (current) use of inhaled steroids: Secondary | ICD-10-CM

## 2023-03-18 DIAGNOSIS — J4550 Severe persistent asthma, uncomplicated: Secondary | ICD-10-CM | POA: Diagnosis present

## 2023-03-18 DIAGNOSIS — Y9389 Activity, other specified: Secondary | ICD-10-CM

## 2023-03-18 DIAGNOSIS — Z9889 Other specified postprocedural states: Secondary | ICD-10-CM

## 2023-03-18 DIAGNOSIS — G4730 Sleep apnea, unspecified: Secondary | ICD-10-CM | POA: Diagnosis present

## 2023-03-18 DIAGNOSIS — Z833 Family history of diabetes mellitus: Secondary | ICD-10-CM

## 2023-03-18 DIAGNOSIS — Z91018 Allergy to other foods: Secondary | ICD-10-CM

## 2023-03-18 DIAGNOSIS — Z91048 Other nonmedicinal substance allergy status: Secondary | ICD-10-CM

## 2023-03-18 DIAGNOSIS — Z91410 Personal history of adult physical and sexual abuse: Secondary | ICD-10-CM

## 2023-03-18 DIAGNOSIS — Z885 Allergy status to narcotic agent status: Secondary | ICD-10-CM

## 2023-03-18 DIAGNOSIS — Z811 Family history of alcohol abuse and dependence: Secondary | ICD-10-CM

## 2023-03-18 DIAGNOSIS — F064 Anxiety disorder due to known physiological condition: Secondary | ICD-10-CM | POA: Diagnosis present

## 2023-03-18 DIAGNOSIS — Z9049 Acquired absence of other specified parts of digestive tract: Secondary | ICD-10-CM

## 2023-03-18 DIAGNOSIS — Z8261 Family history of arthritis: Secondary | ICD-10-CM

## 2023-03-18 DIAGNOSIS — Z820 Family history of epilepsy and other diseases of the nervous system: Secondary | ICD-10-CM

## 2023-03-18 DIAGNOSIS — Z91013 Allergy to seafood: Secondary | ICD-10-CM

## 2023-03-18 DIAGNOSIS — Z91011 Allergy to milk products: Secondary | ICD-10-CM

## 2023-03-18 DIAGNOSIS — Z90710 Acquired absence of both cervix and uterus: Secondary | ICD-10-CM

## 2023-03-18 DIAGNOSIS — T7800XA Anaphylactic reaction due to unspecified food, initial encounter: Principal | ICD-10-CM | POA: Diagnosis present

## 2023-03-18 DIAGNOSIS — Z8042 Family history of malignant neoplasm of prostate: Secondary | ICD-10-CM

## 2023-03-18 DIAGNOSIS — F4321 Adjustment disorder with depressed mood: Secondary | ICD-10-CM | POA: Diagnosis present

## 2023-03-18 LAB — CBC WITH DIFFERENTIAL/PLATELET
Abs Immature Granulocytes: 0.02 10*3/uL (ref 0.00–0.07)
Basophils Absolute: 0.1 10*3/uL (ref 0.0–0.1)
Basophils Relative: 1 %
Eosinophils Absolute: 0.3 10*3/uL (ref 0.0–0.5)
Eosinophils Relative: 3 %
HCT: 41.3 % (ref 36.0–46.0)
Hemoglobin: 13.7 g/dL (ref 12.0–15.0)
Immature Granulocytes: 0 %
Lymphocytes Relative: 52 %
Lymphs Abs: 5 10*3/uL — ABNORMAL HIGH (ref 0.7–4.0)
MCH: 29.5 pg (ref 26.0–34.0)
MCHC: 33.2 g/dL (ref 30.0–36.0)
MCV: 88.8 fL (ref 80.0–100.0)
Monocytes Absolute: 0.8 10*3/uL (ref 0.1–1.0)
Monocytes Relative: 9 %
Neutro Abs: 3.3 10*3/uL (ref 1.7–7.7)
Neutrophils Relative %: 35 %
Platelets: 256 10*3/uL (ref 150–400)
RBC: 4.65 MIL/uL (ref 3.87–5.11)
RDW: 12.5 % (ref 11.5–15.5)
WBC: 9.4 10*3/uL (ref 4.0–10.5)
nRBC: 0 % (ref 0.0–0.2)

## 2023-03-18 LAB — COMPREHENSIVE METABOLIC PANEL
ALT: 11 U/L (ref 0–44)
AST: 15 U/L (ref 15–41)
Albumin: 3.6 g/dL (ref 3.5–5.0)
Alkaline Phosphatase: 64 U/L (ref 38–126)
Anion gap: 14 (ref 5–15)
BUN: 8 mg/dL (ref 6–20)
CO2: 21 mmol/L — ABNORMAL LOW (ref 22–32)
Calcium: 9.5 mg/dL (ref 8.9–10.3)
Chloride: 109 mmol/L (ref 98–111)
Creatinine, Ser: 0.97 mg/dL (ref 0.44–1.00)
GFR, Estimated: >60 mL/min (ref >60)
Glucose, Bld: 171 mg/dL — ABNORMAL HIGH (ref 70–99)
Potassium: 2.6 mmol/L — CL (ref 3.5–5.1)
Sodium: 144 mmol/L (ref 135–145)
Total Bilirubin: 0.3 mg/dL (ref 0.3–1.2)
Total Protein: 7.2 g/dL (ref 6.5–8.1)

## 2023-03-18 LAB — I-STAT CHEM 8, ED
BUN: 7 mg/dL (ref 6–20)
Calcium, Ion: 1.13 mmol/L — ABNORMAL LOW (ref 1.15–1.40)
Chloride: 106 mmol/L (ref 98–111)
Creatinine, Ser: 0.9 mg/dL (ref 0.44–1.00)
Glucose, Bld: 168 mg/dL — ABNORMAL HIGH (ref 70–99)
HCT: 40 % (ref 36.0–46.0)
Hemoglobin: 13.6 g/dL (ref 12.0–15.0)
Potassium: 2.5 mmol/L — CL (ref 3.5–5.1)
Sodium: 143 mmol/L (ref 135–145)
TCO2: 23 mmol/L (ref 22–32)

## 2023-03-18 MED ORDER — POTASSIUM CHLORIDE 10 MEQ/100ML IV SOLN
10.0000 meq | INTRAVENOUS | Status: AC
  Administered 2023-03-19 (×2): 10 meq via INTRAVENOUS
  Filled 2023-03-18: qty 100

## 2023-03-18 MED ORDER — EPINEPHRINE HCL 5 MG/250ML IV SOLN IN NS
0.5000 ug/min | INTRAVENOUS | Status: DC
  Administered 2023-03-18: 5 ug/min via INTRAVENOUS
  Administered 2023-03-19: 0.5 ug/min via INTRAVENOUS
  Filled 2023-03-18: qty 250

## 2023-03-18 MED ORDER — METHYLPREDNISOLONE SODIUM SUCC 125 MG IJ SOLR
125.0000 mg | Freq: Once | INTRAMUSCULAR | Status: AC
  Administered 2023-03-18: 125 mg via INTRAVENOUS

## 2023-03-18 MED ORDER — DIPHENHYDRAMINE HCL 50 MG/ML IJ SOLN
25.0000 mg | Freq: Once | INTRAMUSCULAR | Status: AC
  Administered 2023-03-18: 25 mg via INTRAVENOUS

## 2023-03-18 NOTE — ED Provider Notes (Signed)
[place] EMERGENCY DEPARTMENT AT [facility] Provider Note   CSN: [id number] Arrival date & time: 03/18/23  2157     History  Chief Complaint  Patient presents with   Allergic Reaction    Megan Wilkerson is a 57 y.o. female.  Patient is a 57 year old female with a history of food allergies, seizures, asthma who was eating a piece of pizza tonight when she only started having scratchiness of her throat and trouble breathing.  Family administered an EpiPen which initially helped her symptoms but then symptoms came back.  They called 911 when EMS arrived patient was in respiratory distress with facial swelling and stridor.  They gave another epi with minimal improvement and gave a third and then started patient on an epi drip.  They had to assist respirations and route as patient continued to cough.  No other medications were given at the time.  Patient's blood pressure at the lowest was in the low 100s with heart rate up to 120.  Upon arrival here patient states she is starting to feel better.  Still coughing some.  The history is provided by the patient and the EMS personnel.  Allergic Reaction Presenting symptoms: difficulty breathing, swelling and wheezing        Home Medications Prior to Admission medications   Medication Sig Start Date End Date Taking? Authorizing Provider  albuterol (PROVENTIL) (2.5 MG/3ML) 0.083% nebulizer solution Take 3 mLs (2.5 mg total) by nebulization every 6 (six) hours as needed for wheezing or shortness of breath. 05/13/21   Pincus Sanes, MD  albuterol (VENTOLIN HFA) 108 (90 Base) MCG/ACT inhaler Inhale 2 puffs into the lungs every 6 (six) hours as needed for wheezing or shortness of breath. 09/13/19   Myrlene Broker, MD  benzonatate (TESSALON) 200 MG capsule Take 1 capsule (200 mg total) by mouth 3 (three) times daily as needed for cough. 05/13/21   Pincus Sanes, MD  brimonidine (ALPHAGAN) 0.15 % ophthalmic solution Place 1 drop  into both eyes 2 (two) times daily.    [provider]  brimonidine (ALPHAGAN) 0.2 % ophthalmic solution 1 drop 2 (two) times daily. 07/01/19   [provider]  budesonide-formoterol (SYMBICORT) 160-4.5 MCG/ACT inhaler Inhale two puffs twice daily to prevent cough or wheeze. Use with spacer.  Rinse, gargle, and spit after use. 09/13/19   Myrlene Broker, MD  chlorpheniramine-HYDROcodone [facility] PENNKINETIC ER) 10-8 MG/5ML SUER Take 5 mLs by mouth every 12 (twelve) hours as needed for cough. 05/13/21   Pincus Sanes, MD  EPINEPHrine 0.3 mg/0.3 mL IJ SOAJ injection Inject 0.3 mLs (0.3 mg total) into the muscle once for 1 dose. 09/13/19 10/10/19  Myrlene Broker, MD  fexofenadine (ALLEGRA) 180 MG tablet Take 180 mg by mouth daily.    [provider]  fluticasone (FLONASE) 50 MCG/ACT nasal spray Place 2 sprays into both nostrils daily. 09/13/19   Myrlene Broker, MD  latanoprost (XALATAN) 0.005 % ophthalmic solution Place 1 drop into both eyes at bedtime.  10/31/17   [provider]  methylPREDNISolone (MEDROL DOSEPAK) 4 MG TBPK tablet 24 mg PO on day 1, then decr. by 4 mg/day x5 days 05/13/21   Pincus Sanes, MD  montelukast (SINGULAIR) 10 MG tablet Take 1 tablet (10 mg total) by mouth daily. 09/13/19   Myrlene Broker, MD  Nebulizers (COMPRESSOR/NEBULIZER) MISC As directed 02/11/15   Waymon Budge, MD  pantoprazole (PROTONIX) 40 MG tablet Take 1 tablet (40 mg  total) by mouth daily. 09/13/19   Myrlene Broker, MD  Pyridoxine HCl (B-6 PO) Take 1 tablet by mouth daily at 12 noon.    [provider]  Sodium Sulfate-Mag Sulfate-KCl (SUTAB) [phone] MG TABS Take 24 tablets by mouth as directed. BIN: [id number] PCN: CN GROUP: [id number] MEMBER ID: [id number];GLO AS CASH 10/17/19   PyrtleCarie Caddy, MD      Allergies    Milk-related compounds, Other, Peanuts [peanut oil], Corn dextrin, Gluten meal, Shellfish allergy, Soy allergy, Sunflower  oil, Tomato, Wheat, and Morphine    Review of Systems   Review of Systems  Respiratory:  Positive for wheezing.     Physical Exam Updated Vital Signs BP 112/66   Pulse 90   Resp 13   SpO2 98%  Physical Exam Vitals and nursing note reviewed.  Constitutional:      General: She is not in acute distress.    Appearance: She is well-developed.     Comments: Patient seems a little confused about everything that it just happened but is responding appropriately  HENT:     Head: Normocephalic and atraumatic.     Mouth/Throat:     Comments: No notable mouth swelling or pharyngeal swelling.  No stridor Eyes:     Pupils: Pupils are equal, round, and reactive to light.  Cardiovascular:     Rate and Rhythm: Normal rate and regular rhythm.     Heart sounds: Normal heart sounds. No murmur heard.    No friction rub.  Pulmonary:     Effort: Pulmonary effort is normal.     Breath sounds: Normal breath sounds. No wheezing or rales.     Comments: No wheezing at this time Abdominal:     General: Bowel sounds are normal. There is no distension.     Palpations: Abdomen is soft.     Tenderness: There is no abdominal tenderness. There is no guarding or rebound.  Musculoskeletal:        General: No tenderness. Normal range of motion.     Cervical back: Normal range of motion and neck supple.     Comments: No edema  Skin:    General: Skin is warm and dry.     Findings: No rash.  Neurological:     Mental Status: She is alert and oriented to person, place, and time.     Cranial Nerves: No cranial nerve deficit.  Psychiatric:        Behavior: Behavior normal.     ED Results / Procedures / Treatments   Labs (all labs ordered are listed, but only abnormal results are displayed) Labs Reviewed  CBC WITH DIFFERENTIAL/PLATELET - Abnormal; Notable for the following components:      Result Value   Lymphs Abs 5.0 (*)    All other components within normal limits  COMPREHENSIVE METABOLIC PANEL -  Abnormal; Notable for the following components:   Potassium 2.6 (*)    CO2 21 (*)    Glucose, Bld 171 (*)    All other components within normal limits  I-STAT CHEM 8, ED - Abnormal; Notable for the following components:   Potassium 2.5 (*)    Glucose, Bld 168 (*)    Calcium, Ion 1.13 (*)    All other components within normal limits    EKG None  Radiology No results found.  Procedures Procedures    Medications Ordered in ED Medications  EPINEPHrine (ADRENALIN) 5 mg in NS 250 mL (0.02 mg/mL) premix infusion (0 mcg/min Intravenous  Stopped 03/18/23 2354)  potassium chloride 10 mEq in 100 mL IVPB (has no administration in time range)  methylPREDNISolone sodium succinate (SOLU-MEDROL) 125 mg/2 mL injection 125 mg (125 mg Intravenous Given 03/18/23 2215)  diphenhydrAMINE (BENADRYL) injection 25 mg (25 mg Intravenous Given 03/18/23 2215)    ED Course/ Medical Decision Making/ A&P                                 Medical Decision Making Amount and/or Complexity of Data Reviewed Labs: ordered. ECG/medicine tests: ordered.  Risk Prescription drug management.   Pt with multiple medical problems and comorbidities and presenting today with a complaint that caries a high risk for morbidity and mortality.  Patient is intermittently coughing here and epi drip was continued.  Patient was given Solu-Medrol and Benadryl.  After approximately an hour and a half on repeat evaluation patient is feeling much more comfortable.  Speaking in full sentences and sats are greater than 95% on 2 L.  I independently interpreted patient's labs and CBC within normal limits and CMP with hypokalemia with a potassium of 2.6 but otherwise normal.  Patient given IV potassium replacement.  Will attempt to wean off epi watching patient closely.  If patient has no recurrent reaction feel that she can be admitted to hospitalist for observation.  Findings discussed with the patient and she is comfortable with this  plan.  Attempted to call patient's son to update him but nobody answered the phone.  CRITICAL CARE Performed by: Ghadeer Kastelic Total critical care time: 30 minutes Critical care time was exclusive of separately billable procedures and treating other patients. Critical care was necessary to treat or prevent imminent or life-threatening deterioration. Critical care was time spent personally by me on the following activities: development of treatment plan with patient and/or surrogate as well as nursing, discussions with consultants, evaluation of patient's response to treatment, examination of patient, obtaining history from patient or surrogate, ordering and performing treatments and interventions, ordering and review of laboratory studies, ordering and review of radiographic studies, pulse oximetry and re-evaluation of patient's condition.        Final Clinical Impression(s) / ED Diagnoses Final diagnoses:  Anaphylaxis, initial encounter  Allergic reaction to food, initial encounter    Rx / DC Orders ED Discharge Orders     None         Gwyneth Sprout, MD 03/19/23 0004

## 2023-03-18 NOTE — ED Notes (Signed)
Epinephrine drip stopped by Dr. Clayborne Dana

## 2023-03-18 NOTE — ED Provider Notes (Signed)
11:34 PM Assumed care from Dr. Anitra Lauth, please see their note for full history, physical and decision making until this point. In brief this is a 57 y.o. year old female who presented to the ED tonight with Allergic Reaction     Anaphylaxis likely 2/2 food. Resp distress. Epi x 3, now on infusion.   Epi shut off and watched for about an hour and still feels well but is tachypneic, hoarse and some upper airway noises (stridor vs wheezing?). D/w PCCM who will see for consultation.  PCCM visualized larynx and still had edema. Will admit to ICU for initial observation.   CRITICAL CARE Performed by: Marily Memos Total critical care time: 35 minutes Critical care time was exclusive of separately billable procedures and treating other patients. Critical care was necessary to treat or prevent imminent or life-threatening deterioration. Critical care was time spent personally by me on the following activities: development of treatment plan with patient and/or surrogate as well as nursing, discussions with consultants, evaluation of patient's response to treatment, examination of patient, obtaining history from patient or surrogate, ordering and performing treatments and interventions, ordering and review of laboratory studies, ordering and review of radiographic studies, pulse oximetry and re-evaluation of patient's condition.   Labs, studies and imaging reviewed by myself and considered in medical decision making if ordered. Imaging interpreted by radiology.  Labs Reviewed  CBC WITH DIFFERENTIAL/PLATELET - Abnormal; Notable for the following components:      Result Value   Lymphs Abs 5.0 (*)    All other components within normal limits  COMPREHENSIVE METABOLIC PANEL - Abnormal; Notable for the following components:   Potassium 2.6 (*)    CO2 21 (*)    Glucose, Bld 171 (*)    All other components within normal limits  I-STAT CHEM 8, ED - Abnormal; Notable for the following components:    Potassium 2.5 (*)    Glucose, Bld 168 (*)    Calcium, Ion 1.13 (*)    All other components within normal limits    No orders to display    No follow-ups on file.    Dayna Alia, Barbara Cower, MD 03/20/23 [phone]

## 2023-03-18 NOTE — ED Triage Notes (Signed)
Patient from home BIB [facility] w/ anaphylactic reaction. Family called EMS at 2104 tonight found in severe respiratory distress, oral swelling and nausea. EMS did have to administer 3 rounds of IM epi, along with a duoneb however patient required ventilation via BVM. BP was 140/60, HR 108-120 w/ EMS and during epi administration. 20 g LAC by EMS.

## 2023-03-19 ENCOUNTER — Observation Stay (HOSPITAL_COMMUNITY): Payer: Medicaid Other

## 2023-03-19 ENCOUNTER — Inpatient Hospital Stay (HOSPITAL_COMMUNITY): Payer: Medicaid Other

## 2023-03-19 DIAGNOSIS — R569 Unspecified convulsions: Secondary | ICD-10-CM | POA: Diagnosis not present

## 2023-03-19 DIAGNOSIS — Z8042 Family history of malignant neoplasm of prostate: Secondary | ICD-10-CM | POA: Diagnosis not present

## 2023-03-19 DIAGNOSIS — T782XXA Anaphylactic shock, unspecified, initial encounter: Secondary | ICD-10-CM | POA: Diagnosis not present

## 2023-03-19 DIAGNOSIS — T7800XA Anaphylactic reaction due to unspecified food, initial encounter: Secondary | ICD-10-CM | POA: Diagnosis not present

## 2023-03-19 DIAGNOSIS — F32A Depression, unspecified: Secondary | ICD-10-CM | POA: Diagnosis not present

## 2023-03-19 DIAGNOSIS — G629 Polyneuropathy, unspecified: Secondary | ICD-10-CM | POA: Diagnosis not present

## 2023-03-19 DIAGNOSIS — F4321 Adjustment disorder with depressed mood: Secondary | ICD-10-CM | POA: Diagnosis not present

## 2023-03-19 DIAGNOSIS — R0603 Acute respiratory distress: Secondary | ICD-10-CM | POA: Diagnosis not present

## 2023-03-19 DIAGNOSIS — G4730 Sleep apnea, unspecified: Secondary | ICD-10-CM | POA: Diagnosis not present

## 2023-03-19 DIAGNOSIS — F458 Other somatoform disorders: Secondary | ICD-10-CM | POA: Diagnosis not present

## 2023-03-19 DIAGNOSIS — K219 Gastro-esophageal reflux disease without esophagitis: Secondary | ICD-10-CM | POA: Diagnosis not present

## 2023-03-19 DIAGNOSIS — F410 Panic disorder [episodic paroxysmal anxiety] without agoraphobia: Secondary | ICD-10-CM | POA: Diagnosis not present

## 2023-03-19 DIAGNOSIS — E876 Hypokalemia: Secondary | ICD-10-CM | POA: Diagnosis not present

## 2023-03-19 DIAGNOSIS — Z6839 Body mass index (BMI) 39.0-39.9, adult: Secondary | ICD-10-CM | POA: Diagnosis not present

## 2023-03-19 DIAGNOSIS — T783XXA Angioneurotic edema, initial encounter: Secondary | ICD-10-CM | POA: Diagnosis not present

## 2023-03-19 DIAGNOSIS — E785 Hyperlipidemia, unspecified: Secondary | ICD-10-CM | POA: Diagnosis not present

## 2023-03-19 DIAGNOSIS — G43909 Migraine, unspecified, not intractable, without status migrainosus: Secondary | ICD-10-CM | POA: Diagnosis not present

## 2023-03-19 DIAGNOSIS — I10 Essential (primary) hypertension: Secondary | ICD-10-CM | POA: Diagnosis not present

## 2023-03-19 DIAGNOSIS — Y9389 Activity, other specified: Secondary | ICD-10-CM | POA: Diagnosis not present

## 2023-03-19 DIAGNOSIS — H409 Unspecified glaucoma: Secondary | ICD-10-CM | POA: Diagnosis not present

## 2023-03-19 DIAGNOSIS — T7840XA Allergy, unspecified, initial encounter: Secondary | ICD-10-CM | POA: Diagnosis not present

## 2023-03-19 DIAGNOSIS — Z7951 Long term (current) use of inhaled steroids: Secondary | ICD-10-CM | POA: Diagnosis not present

## 2023-03-19 DIAGNOSIS — F064 Anxiety disorder due to known physiological condition: Secondary | ICD-10-CM | POA: Diagnosis not present

## 2023-03-19 DIAGNOSIS — Z62810 Personal history of physical and sexual abuse in childhood: Secondary | ICD-10-CM | POA: Diagnosis not present

## 2023-03-19 DIAGNOSIS — J4550 Severe persistent asthma, uncomplicated: Secondary | ICD-10-CM | POA: Diagnosis not present

## 2023-03-19 DIAGNOSIS — Z634 Disappearance and death of family member: Secondary | ICD-10-CM | POA: Diagnosis not present

## 2023-03-19 LAB — BASIC METABOLIC PANEL
Anion gap: 14 (ref 5–15)
BUN: 8 mg/dL (ref 6–20)
CO2: 20 mmol/L — ABNORMAL LOW (ref 22–32)
Calcium: 10 mg/dL (ref 8.9–10.3)
Chloride: 108 mmol/L (ref 98–111)
Creatinine, Ser: 0.85 mg/dL (ref 0.44–1.00)
GFR, Estimated: >60 mL/min (ref >60)
Glucose, Bld: 157 mg/dL — ABNORMAL HIGH (ref 70–99)
Potassium: 3.9 mmol/L (ref 3.5–5.1)
Sodium: 142 mmol/L (ref 135–145)

## 2023-03-19 LAB — MRSA NEXT GEN BY PCR, NASAL: MRSA by PCR Next Gen: NOT DETECTED

## 2023-03-19 LAB — SEDIMENTATION RATE: Sed Rate: 30 mm/h — ABNORMAL HIGH (ref 0–22)

## 2023-03-19 LAB — C-REACTIVE PROTEIN: CRP: <0.5 mg/dL (ref <1.0)

## 2023-03-19 MED ORDER — FAMOTIDINE IN NACL 20-0.9 MG/50ML-% IV SOLN
20.0000 mg | INTRAVENOUS | Status: DC
  Administered 2023-03-19: 20 mg via INTRAVENOUS
  Filled 2023-03-19: qty 50

## 2023-03-19 MED ORDER — POLYETHYLENE GLYCOL 3350 17 G PO PACK
17.0000 g | PACK | Freq: Every day | ORAL | Status: DC | PRN
  Administered 2023-03-20: 17 g via ORAL
  Filled 2023-03-19: qty 1

## 2023-03-19 MED ORDER — ACETAMINOPHEN 325 MG PO TABS: 650.0000 mg | ORAL_TABLET | Freq: Four times a day (QID) | ORAL | Status: DC | PRN

## 2023-03-19 MED ORDER — ACETAMINOPHEN 10 MG/ML IV SOLN
1000.0000 mg | Freq: Four times a day (QID) | INTRAVENOUS | Status: DC
  Administered 2023-03-19: 1000 mg via INTRAVENOUS
  Filled 2023-03-19 (×3): qty 100

## 2023-03-19 MED ORDER — CHLORHEXIDINE GLUCONATE CLOTH 2 % EX PADS
6.0000 | MEDICATED_PAD | Freq: Every day | CUTANEOUS | Status: DC
  Administered 2023-03-19 – 2023-03-20 (×2): 6 via TOPICAL

## 2023-03-19 MED ORDER — HYDRALAZINE HCL 20 MG/ML IJ SOLN
10.0000 mg | INTRAMUSCULAR | Status: DC | PRN
  Filled 2023-03-19: qty 1

## 2023-03-19 MED ORDER — BUTALBITAL-APAP-CAFFEINE 50-325-40 MG PO TABS
1.0000 | ORAL_TABLET | Freq: Four times a day (QID) | ORAL | Status: DC | PRN
  Administered 2023-03-20: 1 via ORAL
  Filled 2023-03-19: qty 1

## 2023-03-19 MED ORDER — ACETAMINOPHEN 325 MG PO TABS
650.0000 mg | ORAL_TABLET | Freq: Four times a day (QID) | ORAL | Status: DC | PRN
  Administered 2023-03-19: 650 mg
  Filled 2023-03-19: qty 2

## 2023-03-19 MED ORDER — METHYLPREDNISOLONE SODIUM SUCC 125 MG IJ SOLR: 125.0000 mg | Freq: Every day | INTRAMUSCULAR | Status: DC

## 2023-03-19 MED ORDER — LORAZEPAM 2 MG/ML IJ SOLN
2.0000 mg | Freq: Once | INTRAMUSCULAR | Status: AC
Start: 2023-03-19 — End: 2023-03-19
  Administered 2023-03-19: 2 mg via INTRAVENOUS

## 2023-03-19 MED ORDER — ARFORMOTEROL TARTRATE 15 MCG/2ML IN NEBU
15.0000 ug | INHALATION_SOLUTION | Freq: Two times a day (BID) | RESPIRATORY_TRACT | Status: DC
  Administered 2023-03-19 – 2023-03-21 (×5): 15 ug via RESPIRATORY_TRACT
  Filled 2023-03-19 (×7): qty 2

## 2023-03-19 MED ORDER — LORAZEPAM 2 MG/ML IJ SOLN
INTRAMUSCULAR | Status: AC
  Filled 2023-03-19: qty 1

## 2023-03-19 MED ORDER — ENOXAPARIN SODIUM 40 MG/0.4ML IJ SOSY
40.0000 mg | PREFILLED_SYRINGE | INTRAMUSCULAR | Status: DC
Start: 2023-03-19 — End: 2023-03-21
  Administered 2023-03-19 – 2023-03-21 (×3): 40 mg via SUBCUTANEOUS
  Filled 2023-03-19 (×2): qty 0.4

## 2023-03-19 MED ORDER — BUDESONIDE 0.25 MG/2ML IN SUSP
0.2500 mg | Freq: Two times a day (BID) | RESPIRATORY_TRACT | Status: DC
  Administered 2023-03-19 – 2023-03-21 (×5): 0.25 mg via RESPIRATORY_TRACT
  Filled 2023-03-19 (×5): qty 2

## 2023-03-19 MED ORDER — METHYLPREDNISOLONE SODIUM SUCC 125 MG IJ SOLR
125.0000 mg | Freq: Every day | INTRAMUSCULAR | Status: AC
  Administered 2023-03-19: 125 mg via INTRAVENOUS
  Filled 2023-03-19: qty 2

## 2023-03-19 MED ORDER — ORAL CARE MOUTH RINSE
15.0000 mL | OROMUCOSAL | Status: DC | PRN
  Administered 2023-03-19: 15 mL via OROMUCOSAL

## 2023-03-19 NOTE — Progress Notes (Signed)
Brief Progress Note:  Patient seen and examined this morning, feels well, no shortness of breath. On exam, there is no stridor and no wheezing. She continues on epinephrine at 3 mcg/min. At home she is maintained on Symbicort, albuterol, epinephrine pen, fexofenadine, montelukast and pantoprazole. Presented with concern for angioedema vs anaphylactic shock. Tryptase level was sent overnight, will add C4/ESR/CRP to workup. Appears that this AM her symptoms recurred, possibly a biphasic reaction, now improved with resumption of epinephrine.  -continue epinephrine -airway watch -add famotidine -rest of plan per Dr. Candis Musa H&P  Raechel Chute, MD [place] [facility] 03/19/2023 9:31 AM

## 2023-03-19 NOTE — Progress Notes (Signed)
EEG complete - results pending 

## 2023-03-19 NOTE — H&P (Signed)
NAME:  Megan Wilkerson, MRN:  [id number], DOB:  1966/05/27, LOS: 0 ADMISSION DATE:  03/18/2023  Brief Hospital Course  Case of a 57 year old female patient with a past medical history of severe persistent asthma, multiple allergies with a history of anaphylaxis reaction, seizures presenting to [facility] for respiratory distress.  She was found to have angioedema secondary to an anaphylaxis reaction.  Admitted to the ICU for monitoring of respiratory status.  Reportedly she had a piece of pizza tonight and started having scratchiness of her throat and trouble breathing.  She was administered EpiPen by her family members which initially helped her symptoms but then symptoms came back and got worse.  EMS were called and on arrival patient was in respiratory distress with facial swelling and stridor.  They gave her another EpiPen with minimal improvement this was repeated another time and then patient was started on epi drip and brought into [facility].  In the ED she was hemodynamically stable, given 125 mg Solu-Medrol and Benadryl.  After about an hour and a half from arrival she was reevaluated and appeared to be doing much better was able to speak in full sentences therefore epi drip was weaned off.  Labs were only remarkable for hypokalemia with potassium of 2.5.  Otherwise kidney function at baseline.  Chest x-ray without any pulmonary opacities.  On my evaluation, patient alert awake and oriented answering questions coherently.  Still some expiratory wheezing appreciated.  On auscultation minimal stridor however appreciated  Review of Systems:   All systems were reviewed and are negative except for the above.  Objective   Blood pressure (!) 135/90, pulse 74, temperature 97.6 F (36.4 C), temperature source Oral, resp. rate 11, SpO2 100%.       Intake/Output Summary (Last 24 hours) at 03/19/2023 0510 Last data filed at 03/19/2023 0146 Gross per 24 hour  Intake 122.22 ml  Output  --  Net 122.22 ml   There were no vitals filed for this visit.  Examination: GEN no acute distress HEENT supple neck, reactive pupils, EOMI CVS normal S1, normal S2, tachycardic in sinus lungs Lungs clear bilateral air entry.  Minimal stridor heard on auscultation of the neck. GI Soft, non tender, non distended  Extremities Warm and well perfused.   Labs and imaging were reviewed.   Assessment & Plan:  Case of a 57 year old female patient with a past medical history of severe persistent asthma, multiple allergies with a history of anaphylaxis reaction, seizures presenting to [facility] for respiratory distress.  She was found to have angioedema secondary to an anaphylaxis reaction.  Admitted to the ICU for monitoring of respiratory status.  #Anaphylaxis with angioedema in context of multiple food allergies  #Moderate to severe persistent asthma #?Seizure history off antiepilepsy meds for a year or 2. No issues.   Neuro: No issues  CVS: Stable no issues  Lungs: Monitor respiratory status. Brovana and Pulmicort BID. Methylpred 125mg  x1 this am and reassess if taper is needed. continue off epinephrine. Tryptase levels.  GI: No issues. Regular Diet  Heme: Lovenox for DVT prophylaxis  Endo: POCs 140-180  Renal: No issues   Best Practice (right click and "Reselect all SmartList Selections" daily)   Diet/type: Regular consistency (see orders) DVT prophylaxis: LMWH GI prophylaxis: N/A Lines: N/A Foley:  N/A Code Status:  full code  I spent 74 minutes caring for this patient today, including preparing to see the patient, obtaining a medical history , reviewing a separately obtained history,  performing a medically appropriate examination and/or evaluation, ordering medications, tests, or procedures, documenting clinical information in the electronic health record, and independently interpreting results (not separately reported/billed) and communicating results to the  patient/family/caregiver  Janann Colonel, MD [place] [facility] 03/19/2023 5:10 AM

## 2023-03-19 NOTE — ED Notes (Signed)
Pt states she just feels weak at this time

## 2023-03-19 NOTE — Progress Notes (Signed)
eLink Physician-Brief Progress Note Patient Name: Megan Wilkerson DOB: 08-24-1965 MRN: [id number]   Date of Service  03/19/2023  HPI/Events of Note  57 year old female with a history of seizures, asthma who suddenly developed difficulty breathing, swallowing and wheezing while eating pizza.  EMS was called after family administered an EpiPen and EMS delivered 2 additional doses prior to initiating an epinephrine drip.  On examination she is tachypneic, tachycardic and is experiencing some scratchy throat and dyspnea. Results show mild metabolic acidosis with mild hyperglycemia unremarkable CBC.  Chest radiograph unremarkable  eICU Interventions  Although epinephrine drip was turned off with resolution of symptoms and hemodynamic stability, symptoms seem to be returning-resume epi drip  Maintain steroids, consider adding H1/H2 blockers  Enoxaparin for DVT prophylaxis GI prophylaxis not indicated     Intervention Category Evaluation Type: New Patient Evaluation  Curran Lenderman 03/19/2023, 5:45 AM

## 2023-03-19 NOTE — ED Notes (Signed)
ED TO INPATIENT HANDOFF REPORT  ED Nurse Name and Phone #: [phone]  S Name/Age/Gender Megan Wilkerson 57 y.o. female Room/Bed: RESUSC/RESUSC  Code Status   Code Status: Full Code  Home/SNF/Other Home Patient oriented to: self, place, time, and situation Is this baseline? Yes   Triage Complete: Triage complete  Chief Complaint Anaphylaxis [T78.2XXA]  Triage Note Patient from home BIB [facility] w/ anaphylactic reaction. Family called EMS at 2104 tonight found in severe respiratory distress, oral swelling and nausea. EMS did have to administer 3 rounds of IM epi, along with a duoneb however patient required ventilation via BVM. BP was 140/60, HR 108-120 w/ EMS and during epi administration. 20 g LAC by EMS.     Allergies Allergies  Allergen Reactions   Milk-Related Compounds     Mouth itching, dizziness, coughing, wheezing and SOB   Other Anaphylaxis    Jolly rancher gummies and paprika    Peanuts [Peanut Oil] Anaphylaxis and Itching   Corn Dextrin Other (See Comments)    Plain corn ok, popcorn causes hives   Gluten Meal Other (See Comments)    cough   Shellfish Allergy Itching   Soy Allergy Other (See Comments)    coughing   Sunflower Oil Itching   Tomato Itching   Wheat     swelling   Morphine Itching and Rash    Level of Care/Admitting Diagnosis ED Disposition     ED Disposition  Admit   Condition  --   Comment  Hospital Area: [facility] [[id number]]  Level of Care: ICU [6]  May admit patient to [facility] or [facility] if equivalent level of care is available:: Yes  Covid Evaluation: Recent COVID positive no isolation required infection day 21-90  Diagnosis: Anaphylaxis [190363]  Admitting Physician: Janann Colonel [[id number]]  Attending Physician: Janann Colonel [[id number]]  Certification:: I certify this patient will need inpatient services for at least 2 midnights  Expected Medical Readiness: 03/21/2023           B Medical/Surgery History Past Medical History:  Diagnosis Date   Allergy 2012   NUMEROUS ALLERGIES AND ANAPHYLACTIC REACTIONS SINCE 2012   Anaphylactic reaction    several ED visits for same since 01/2012   Anemia    IDA    Asthma    Blood transfusion without reported diagnosis 1988   AFTER  CHILDBIRTH   GERD    Glaucoma    Hives of unknown origin    chronic and recurrent urticaria since 01/2012   HYPERLIPIDEMIA, WITH LOW HDL    MIGRAINE HEADACHE    PERIPHERAL NEUROPATHY    POSITIVE PPD 1987   s/p 95mo tx   Seizures (HCC)    Sleep apnea 2018   CPAP   Past Surgical History:  Procedure Laterality Date   ABDOMINAL HYSTERECTOMY  1996   APPENDECTOMY  1992   CESAREAN SECTION  1989   CHOLECYSTECTOMY  04/22/2011   Procedure: LAPAROSCOPIC CHOLECYSTECTOMY WITH INTRAOPERATIVE CHOLANGIOGRAM;  Surgeon: Almond Lint, MD;  Location: [facility] OR;  Service: General;  Laterality: N/A;   CYST REMOVAL HAND Right 1997     A IV Location/Drains/Wounds Patient Lines/Drains/Airways Status     Active Line/Drains/Airways     Name Placement date Placement time Site Days   Peripheral IV 03/18/23 20 G Left Antecubital 03/18/23  2200  Antecubital  1            Intake/Output Last 24 hours  Intake/Output Summary (Last 24 hours) at 03/19/2023  3664 Last data filed at 03/19/2023 0146 Gross per 24 hour  Intake 122.22 ml  Output --  Net 122.22 ml    Labs/Imaging Results for orders placed or performed during the hospital encounter of 03/18/23 (from the past 48 hour(s))  CBC with Differential/Platelet     Status: Abnormal   Collection Time: 03/18/23 10:05 PM  Result Value Ref Range   WBC 9.4 4.0 - 10.5 K/uL   RBC 4.65 3.87 - 5.11 MIL/uL   Hemoglobin 13.7 12.0 - 15.0 g/dL   HCT 40.3 47.4 - 25.9 %   MCV 88.8 80.0 - 100.0 fL   MCH 29.5 26.0 - 34.0 pg   MCHC 33.2 30.0 - 36.0 g/dL   RDW 56.3 87.5 - 64.3 %   Platelets 256 150 - 400 K/uL   nRBC 0.0 0.0 - 0.2 %   Neutrophils Relative % 35 %    Neutro Abs 3.3 1.7 - 7.7 K/uL   Lymphocytes Relative 52 %   Lymphs Abs 5.0 (H) 0.7 - 4.0 K/uL   Monocytes Relative 9 %   Monocytes Absolute 0.8 0.1 - 1.0 K/uL   Eosinophils Relative 3 %   Eosinophils Absolute 0.3 0.0 - 0.5 K/uL   Basophils Relative 1 %   Basophils Absolute 0.1 0.0 - 0.1 K/uL   Immature Granulocytes 0 %   Abs Immature Granulocytes 0.02 0.00 - 0.07 K/uL    Comment: Performed at [facility], [address] [place], [place] [address]  Comprehensive metabolic panel     Status: Abnormal   Collection Time: 03/18/23 10:05 PM  Result Value Ref Range   Sodium 144 135 - 145 mmol/L   Potassium 2.6 (LL) 3.5 - 5.1 mmol/L    Comment: CRITICAL RESULT CALLED TO, READ BACK BY AND VERIFIED WITH MOREFIELD,J RN 2251 03/18/23 AMIREHSANI F REPEATED TO VERIFY    Chloride 109 98 - 111 mmol/L   CO2 21 (L) 22 - 32 mmol/L   Glucose, Bld 171 (H) 70 - 99 mg/dL    Comment: Glucose reference range applies only to samples taken after fasting for at least 8 hours.   BUN 8 6 - 20 mg/dL   Creatinine, Ser 8.84 0.44 - 1.00 mg/dL   Calcium 9.5 8.9 - 16.6 mg/dL   Total Protein 7.2 6.5 - 8.1 g/dL   Albumin 3.6 3.5 - 5.0 g/dL   AST 15 15 - 41 U/L   ALT 11 0 - 44 U/L   Alkaline Phosphatase 64 38 - 126 U/L   Total Bilirubin 0.3 0.3 - 1.2 mg/dL   GFR, Estimated >06 >30 mL/min    Comment: (NOTE) Calculated using the CKD-EPI Creatinine Equation (2021)    Anion gap 14 5 - 15    Comment: Performed at [facility], [address] [place], [place] [address]  I-stat chem 8, ed     Status: Abnormal   Collection Time: 03/18/23 10:07 PM  Result Value Ref Range   Sodium 143 135 - 145 mmol/L   Potassium 2.5 (LL) 3.5 - 5.1 mmol/L   Chloride 106 98 - 111 mmol/L   BUN 7 6 - 20 mg/dL   Creatinine, Ser 9.32 0.44 - 1.00 mg/dL   Glucose, Bld 355 (H) 70 - 99 mg/dL    Comment: Glucose reference range applies only to samples taken after fasting for at least 8 hours.   Calcium, Ion 1.13 (L) 1.15 -  1.40 mmol/L   TCO2 23 22 - 32 mmol/L  Hemoglobin 13.6 12.0 - 15.0 g/dL   HCT 65.7 84.6 - 96.2 %   Comment NOTIFIED PHYSICIAN    No results found.  Pending Labs Unresulted Labs (From admission, onward)     Start     Ordered   03/19/23 0420  Tryptase  Add-on,   AD        03/19/23 0419   03/19/23 0251  HIV Antibody (routine testing w rflx)  (HIV Antibody (Routine testing w reflex) panel)  Once,   R        03/19/23 0251            Vitals/Pain Today's Vitals   03/19/23 0233 03/19/23 0300 03/19/23 0330 03/19/23 0400  BP:  128/86 (!) 135/95 138/86  Pulse:  76 77 68  Resp:  15 18 (!) 9  Temp: 97.6 F (36.4 C)     TempSrc: Oral     SpO2:  99% 98% 95%  PainSc:        Isolation Precautions No active isolations  Medications Medications  EPINEPHrine (ADRENALIN) 5 mg in NS 250 mL (0.02 mg/mL) premix infusion (0 mcg/min Intravenous Stopped 03/18/23 2354)  polyethylene glycol (MIRALAX / GLYCOLAX) packet 17 g (has no administration in time range)  enoxaparin (LOVENOX) injection 40 mg (has no administration in time range)  arformoterol (BROVANA) nebulizer solution 15 mcg (has no administration in time range)  budesonide (PULMICORT) nebulizer solution 0.25 mg (has no administration in time range)  methylPREDNISolone sodium succinate (SOLU-MEDROL) 125 mg/2 mL injection 125 mg (has no administration in time range)  methylPREDNISolone sodium succinate (SOLU-MEDROL) 125 mg/2 mL injection 125 mg (125 mg Intravenous Given 03/18/23 2215)  diphenhydrAMINE (BENADRYL) injection 25 mg (25 mg Intravenous Given 03/18/23 2215)  potassium chloride 10 mEq in 100 mL IVPB (0 mEq Intravenous Stopped 03/19/23 0146)    Mobility walks     Focused Assessments Pulmonary Assessment Handoff:  Lung sounds:          R Recommendations: See Admitting Provider Note  Report given to:   Additional Notes: pt has sleep apnea buy does not wear a CPAP - I Have her on 2L O2.

## 2023-03-19 NOTE — Progress Notes (Signed)
On-call neurology note Called by the PCCM attending.  Patient had an episode of seizure-like activity. Patient is known to our service and has confirmed nonepileptic spells captured on EEG. Routine EEG is reasonable but I would not treat her with benzodiazepines unless her seizures are more than 5 minutes.  No need for antiepileptics Maintain seizure precautions Please call back with questions if we can be of further assistance   Milon Dikes, MD Neurology

## 2023-03-19 NOTE — Procedures (Signed)
Patient Name: Megan Wilkerson  MRN: [id number]  Epilepsy Attending: Charlsie Quest  Referring Physician/Provider: Raechel Chute, MD  Date: 03/19/2023 Duration: 27.13 mins  Patient history: 57yo F with an episode of seizure-like activity. EEG to evaluate for seizure  Level of alertness: Awake, asleep  AEDs during EEG study: Ativan  Technical aspects: This EEG study was done with scalp electrodes positioned according to the 10-20 International system of electrode placement. Electrical activity was reviewed with band pass filter of 1-70Hz , sensitivity of 7 uV/mm, display speed of 92mm/sec with a 60Hz  notched filter applied as appropriate. EEG data were recorded continuously and digitally stored.  Video monitoring was available and reviewed as appropriate.  Description: The posterior dominant rhythm consists of 9 Hz activity of moderate voltage (25-35 uV) seen predominantly in posterior head regions, symmetric and reactive to eye opening and eye closing. Sleep was characterized by vertex waves, sleep spindles (12 to 14 Hz), maximal frontocentral region. Low amplitude 13-15hz  beta activity was noted symmetrically and diffusely. Physiologic photic driving was not seen during photic stimulation. Hyperventilation and photic stimulation were not performed.     IMPRESSION: This study is within normal limits. No seizures or epileptiform discharges were seen throughout the recording.  A normal interictal EEG does not exclude the diagnosis of epilepsy.  Alanni Vader Annabelle Harman

## 2023-03-19 NOTE — Progress Notes (Addendum)
eLink Physician-Brief Progress Note Patient Name: Megan Wilkerson DOB: 1965/11/03 MRN: [id number]   Date of Service  03/19/2023  HPI/Events of Note  57 year old with a history of seizures and asthma who presented with acute anaphylactic shock requiring epi drip.  Had a rebound reaction this morning.  Now off epinephrine infusion.  Complaining of 10 out of 10 headache with blurred vision with mild hypertension in the 140s.  eICU Interventions  Add as needed antihypertensives.  Add as needed Tylenol for headaches.   2359 -added Fioricet temporarily for headache.  Refractory to acetaminophen.  DC'd scheduled IV Tylenol.  Able to tolerate enteral Tylenol  Intervention Category Intermediate Interventions: Hypertension - evaluation and management  Kieth Hartis 03/19/2023, 9:04 PM

## 2023-03-20 DIAGNOSIS — T782XXA Anaphylactic shock, unspecified, initial encounter: Secondary | ICD-10-CM | POA: Diagnosis not present

## 2023-03-20 LAB — GLUCOSE, CAPILLARY
Glucose-Capillary: 149 mg/dL — ABNORMAL HIGH (ref 70–99)
Glucose-Capillary: 132 mg/dL — ABNORMAL HIGH (ref 70–99)

## 2023-03-20 MED ORDER — MONTELUKAST SODIUM 10 MG PO TABS
10.0000 mg | ORAL_TABLET | Freq: Every day | ORAL | Status: DC
  Administered 2023-03-20 – 2023-03-21 (×2): 10 mg via ORAL
  Filled 2023-03-20 (×2): qty 1

## 2023-03-20 MED ORDER — BRIMONIDINE TARTRATE 0.15 % OP SOLN
1.0000 [drp] | Freq: Two times a day (BID) | OPHTHALMIC | Status: DC
Start: 2023-03-20 — End: 2023-03-21
  Administered 2023-03-20 – 2023-03-21 (×3): 1 [drp] via OPHTHALMIC
  Filled 2023-03-20: qty 5

## 2023-03-20 MED ORDER — BUSPIRONE HCL 10 MG PO TABS
10.0000 mg | ORAL_TABLET | Freq: Two times a day (BID) | ORAL | Status: DC
  Administered 2023-03-20 – 2023-03-21 (×3): 10 mg via ORAL
  Filled 2023-03-20 (×3): qty 1

## 2023-03-20 MED ORDER — FLUTICASONE PROPIONATE 50 MCG/ACT NA SUSP
2.0000 | Freq: Every day | NASAL | Status: DC
  Administered 2023-03-20 – 2023-03-21 (×2): 2 via NASAL
  Filled 2023-03-20: qty 16

## 2023-03-20 MED ORDER — LATANOPROST 0.005 % OP SOLN
1.0000 [drp] | Freq: Every day | OPHTHALMIC | Status: DC
  Administered 2023-03-20: 1 [drp] via OPHTHALMIC
  Filled 2023-03-20: qty 2.5

## 2023-03-20 MED ORDER — LORATADINE 10 MG PO TABS
10.0000 mg | ORAL_TABLET | Freq: Every day | ORAL | Status: DC
  Administered 2023-03-20 – 2023-03-21 (×2): 10 mg via ORAL
  Filled 2023-03-20 (×2): qty 1

## 2023-03-20 MED ORDER — HYDROXYZINE HCL 25 MG PO TABS: 25.0000 mg | ORAL_TABLET | Freq: Three times a day (TID) | ORAL | Status: DC | PRN

## 2023-03-20 MED ORDER — RACEPINEPHRINE HCL 2.25 % IN NEBU
INHALATION_SOLUTION | RESPIRATORY_TRACT | Status: AC
  Filled 2023-03-20: qty 0.5

## 2023-03-20 MED ORDER — PANTOPRAZOLE SODIUM 40 MG PO TBEC
40.0000 mg | DELAYED_RELEASE_TABLET | Freq: Every day | ORAL | Status: DC
  Administered 2023-03-20 – 2023-03-21 (×2): 40 mg via ORAL
  Filled 2023-03-20 (×2): qty 1

## 2023-03-20 MED ORDER — BENZONATATE 100 MG PO CAPS
200.0000 mg | ORAL_CAPSULE | Freq: Three times a day (TID) | ORAL | Status: DC | PRN
  Administered 2023-03-20: 200 mg via ORAL
  Filled 2023-03-20 (×2): qty 2

## 2023-03-20 MED ORDER — FAMOTIDINE 20 MG PO TABS
20.0000 mg | ORAL_TABLET | Freq: Every day | ORAL | Status: DC
  Administered 2023-03-20: 20 mg via ORAL
  Filled 2023-03-20: qty 1

## 2023-03-20 MED ORDER — PREDNISONE 20 MG PO TABS
40.0000 mg | ORAL_TABLET | Freq: Every day | ORAL | Status: DC
  Administered 2023-03-21: 40 mg via ORAL
  Filled 2023-03-20: qty 2

## 2023-03-20 NOTE — Plan of Care (Signed)
  Problem: Education: Goal: Knowledge of General Education information will improve Description: Including pain rating scale, medication(s)/side effects and non-pharmacologic comfort measures Outcome: Progressing   Problem: Health Behavior/Discharge Planning: Goal: Ability to manage health-related needs will improve Outcome: Progressing   Problem: Clinical Measurements: Goal: Ability to maintain clinical measurements within normal limits will improve Outcome: Progressing Goal: Will remain free from infection Outcome: Progressing Goal: Respiratory complications will improve Outcome: Progressing Goal: Cardiovascular complication will be avoided Outcome: Progressing   Problem: Activity: Goal: Risk for activity intolerance will decrease Outcome: Progressing   Problem: Coping: Goal: Level of anxiety will decrease Outcome: Progressing   Problem: Elimination: Goal: Will not experience complications related to urinary retention Outcome: Progressing

## 2023-03-20 NOTE — Plan of Care (Signed)

## 2023-03-20 NOTE — Progress Notes (Addendum)
NAME:  Megan Wilkerson, MRN:  [id number], DOB:  08-07-1965, LOS: 1 ADMISSION DATE:  03/18/2023  Brief Hospital Course  Case of a 57 year old female patient with a past medical history of severe persistent asthma, multiple allergies with a history of anaphylaxis reaction, seizures presenting to [facility] for respiratory distress.  She was found to have angioedema secondary to an anaphylaxis reaction.  Admitted to the ICU for monitoring of respiratory status.  Reportedly she had a piece of pizza tonight and started having scratchiness of her throat and trouble breathing.  She was administered EpiPen by her family members which initially helped her symptoms but then symptoms came back and got worse.  EMS were called and on arrival patient was in respiratory distress with facial swelling and stridor.  They gave her another EpiPen with minimal improvement this was repeated another time and then patient was started on epi drip and brought into [facility].  In the ED she was hemodynamically stable, given 125 mg Solu-Medrol and Benadryl.  After about an hour and a half from arrival she was reevaluated and appeared to be doing much better was able to speak in full sentences therefore epi drip was weaned off.  Labs were only remarkable for hypokalemia with potassium of 2.5.  Otherwise kidney function at baseline.  Chest x-ray without any pulmonary opacities.  On my evaluation, patient alert awake and oriented answering questions coherently.  Still some expiratory wheezing appreciated.  On auscultation minimal stridor however appreciated  Subjective  Had another seizure-like episode this am after eating breakfast. Currently HD stable on nebulized epi.  Objective   Blood pressure (!) 136/90, pulse 72, temperature 97.8 F (36.6 C), temperature source Oral, resp. rate 15, height 5\' 6"  (1.676 m), weight 110.3 kg, SpO2 94%.       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  Here with recurrent anaphylaxis/bronchospasm type episodes followed by shaking with complete resolution through day: more c/w VCD and pseudoseizures.  - LABA/ICS nebs, can go to symbicort or similar on DC - Loratidine/singulair - Add-on tryptase added - PTA eye gtt - Steroids to PO x 5 days - Aggressive GERD tx with H2b and PPI - Psych consult to see if mood stabilizers may be helpful vs. Inpatient therapy? - Needs to re-establish care with allergy as OP and get full TH2 workup - She does not feel comfortable going home with current ongoing stridor-type episodes and pseudoseizures  Really do not see any ICU needs at this time.  Her syndromes are well-documented in literature but without great treatment modalities.  Appreciate TR[facility] taking over starting 03/21/23.   Lorin Glass, MD [place] [facility] 03/20/2023 8:24 AM
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
## 2023-03-21 ENCOUNTER — Other Ambulatory Visit (HOSPITAL_COMMUNITY): Payer: Self-pay

## 2023-03-21 DIAGNOSIS — T782XXA Anaphylactic shock, unspecified, initial encounter: Secondary | ICD-10-CM

## 2023-03-21 LAB — TRYPTASE: Tryptase: 5.2 ug/L (ref 2.2–13.2)

## 2023-03-21 MED ORDER — BUDESONIDE-FORMOTEROL FUMARATE 80-4.5 MCG/ACT IN AERO
2.0000 | INHALATION_SPRAY | Freq: Two times a day (BID) | RESPIRATORY_TRACT | 3 refills | Status: AC
  Filled 2023-03-21: qty 10.2, 30d supply, fill #0

## 2023-03-21 MED ORDER — PREDNISONE 20 MG PO TABS
40.0000 mg | ORAL_TABLET | Freq: Every day | ORAL | 0 refills | Status: AC
  Filled 2023-03-21: qty 8, 4d supply, fill #0

## 2023-03-21 MED ORDER — BUSPIRONE HCL 10 MG PO TABS
10.0000 mg | ORAL_TABLET | Freq: Two times a day (BID) | ORAL | 0 refills | Status: AC
  Filled 2023-03-21: qty 60, 30d supply, fill #0

## 2023-03-21 MED ORDER — LORATADINE 10 MG PO TABS
10.0000 mg | ORAL_TABLET | Freq: Every day | ORAL | 0 refills | Status: DC
  Filled 2023-03-21: qty 30, 30d supply, fill #0

## 2023-03-21 MED ORDER — EPINEPHRINE 0.3 MG/0.3ML IJ SOAJ
0.3000 mg | INTRAMUSCULAR | 0 refills | Status: DC | PRN
  Filled 2023-03-21: qty 2, 30d supply, fill #0

## 2023-03-21 MED ORDER — HYDROXYZINE HCL 25 MG PO TABS
25.0000 mg | ORAL_TABLET | Freq: Three times a day (TID) | ORAL | 0 refills | Status: AC | PRN
  Filled 2023-03-21: qty 90, 30d supply, fill #0

## 2023-03-21 MED ORDER — PANTOPRAZOLE SODIUM 40 MG PO TBEC
40.0000 mg | DELAYED_RELEASE_TABLET | Freq: Every day | ORAL | 0 refills | Status: AC
  Filled 2023-03-21: qty 30, 30d supply, fill #0

## 2023-03-21 NOTE — Plan of Care (Signed)
  Problem: Nutrition: Goal: Adequate nutrition will be maintained Outcome: Progressing   Problem: Coping: Goal: Level of anxiety will decrease Outcome: Progressing   Problem: Elimination: Goal: Will not experience complications related to bowel motility Outcome: Progressing   Problem: Elimination: Goal: Will not experience complications related to urinary retention Outcome: Progressing   

## 2023-03-21 NOTE — Progress Notes (Signed)
Megan Wilkerson to be D/C'd Home per MD order.  Discussed with the patient and all questions fully answered.  VSS, Skin clean, dry and intact without evidence of skin break down, no evidence of skin tears noted. IV catheter discontinued intact. Site without signs and symptoms of complications. Dressing and pressure applied.  An After Visit Summary was printed and given to the patient. Patient received prescription.  D/c education completed with patient/family including follow up instructions, medication list, d/c activities limitations if indicated, with other d/c instructions as indicated by MD - patient able to verbalize understanding, all questions fully answered.   Patient instructed to return to ED, call 911, or call MD for any changes in condition.   Patient escorted via WC, and D/C home via private auto.   Conley Canal [facility] 03/21/2023 2:32 PM

## 2023-03-21 NOTE — Discharge Summary (Signed)
Physician Discharge Summary  Megan Wilkerson ZOX:[id number] DOB: [birth year] DOA: 03/18/2023  PCP: [place], Betty G, MD  Admit date: 03/18/2023 Discharge date: 03/21/2023  Admitted From: Home Disposition: Home  Recommendations for Outpatient Follow-up:  Follow up with PCP in 1-2 weeks Follow-up with behavioral health as scheduled on 04/04/2023 with Dr. Jerrel Ivory Outpatient follow-up with allergy clinic Started on BuSpar, hydroxyzine as needed Continue prednisone to complete 5-day course for allergic reaction versus anaphylaxis Started on Symbicort per pulmonology recommendations, Claritin  Home Health: No Equipment/Devices: None  Discharge Condition: Stable CODE STATUS: Full code Diet recommendation: Heart healthy diet  History of present illness:  Megan Wilkerson is a 57 year old female with past medical history of severe persistent asthma, multiple allergies with history of laxatives, history of pseudoseizures who presented to [facility] ED on 03/18/2023 with respiratory distress.  Reportedly she had a piece of pizza and started having scratchiness to her throat and difficulty breathing.  She was administered EpiPen by her family members which initially helped her symptoms but symptoms returned and progressed.  EMS was activated on arrival patient was noted to be in respite distress with facial swelling and stridor, patient was given another EpiPen with minimal improvement, repeated a third time and patient was started on epi drip and transferred to the ED for further evaluation.  In the ED, temperature 97.7 F, HR 110, RR 22, BP 122/90, SpO2 100% on 2 L nasal cannula.  WBC 9.4, hemoglobin 13.7, platelet count 256.  Sodium 144, potassium 2.6, chloride 109, CO2 21, glucose 171, BUN 8, creatinine 0.97.  AST 15, ALT 11, total bilirubin 0.3.  Chest x-ray with no active cardiopulmonary disease process.  Patient was continued on epinephrine drip, given 125 mg IV Solu-Medrol and Benadryl.  Patient was  admitted to the [facility] service.  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  Patient was given 3 rounds of IM epinephrine prior to ED presentation and started on epinephrine drip.  Patient was started on IV Solu-Medrol with improvement of symptoms.  Epinephrine drip was weaned off and patient will continue prednisone to complete 5-day course per pulmonary critical care medicine.  Tryptase level was within normal limits.  EpiPen's are refilled.  Recommend outpatient follow-up with allergy clinic for full TH2 workup.  Severe persistent asthma Started on Symbicort.  Outpatient follow-up with pulmonology, allergy clinic.  Anxiety/depression Seen by psychiatry during hospitalization.  Started on BuSpar, hydralazine as needed.  Outpatient follow-up scheduled with behavioral health clinic on 04/04/2023 with Dr. Jerrel Ivory.   Hypokalemia Repleted  History of pseudoseizures/nonepileptic seizure-like activity Discussed with neurology by PCCM, no need for further testing at this point as patient well-known to their service.  No seizure-like activity witnessed during her hospitalization.  Outpatient follow-up with PCP.  Morbid obesity Body mass index is 39.25 kg/m.  Complicates all facets of care    Discharge Diagnoses:  Principal Problem:   Anaphylaxis Active Problems:   Angio-edema    Discharge Instructions  Discharge Instructions     Call MD for:  difficulty breathing, headache or visual disturbances    Complete by: As directed    Call MD for:  extreme  fatigue   Complete by: As directed    Call MD for:  persistant dizziness or light-headedness   Complete by: As directed    Call MD for:  persistant nausea and vomiting   Complete by: As directed    Call MD for:  severe uncontrolled pain   Complete by: As directed    Call MD for:  temperature >100.4   Complete by: As directed    Diet - low sodium heart healthy   Complete by: As directed    Increase activity slowly   Complete by: As directed       Allergies as of 03/21/2023       Reactions   Milk-related Compounds    Mouth itching, dizziness, coughing, wheezing and SOB   Other Anaphylaxis   Archie Balboa rancher gummies and paprika   Peanuts [peanut Oil] Anaphylaxis, Itching   Corn Dextrin Other (See Comments)   Plain corn ok, popcorn causes hives   Gluten Meal Other (See Comments)   cough   Shellfish Allergy Itching   Soy Allergy Other (See Comments)   coughing   Sunflower Oil Itching   Tomato Itching   Wheat    swelling   Morphine Itching, Rash        Medication List     TAKE these medications    brimonidine 0.15 % ophthalmic solution Commonly known as: ALPHAGAN Place 1 drop into both eyes 2 (two) times daily.   budesonide-formoterol 80-4.5 MCG/ACT inhaler Commonly known as: Symbicort Inhale 2 puffs into the lungs 2 (two) times daily.   busPIRone 10 MG tablet Commonly known as: BUSPAR Take 1 tablet (10 mg total) by mouth 2 (two) times daily.   EPINEPHrine 0.3 mg/0.3 mL Soaj injection Commonly known as: EPI-PEN Inject 0.3 mg into the muscle as needed for anaphylaxis. What changed:  when to take this reasons to take this   hydrOXYzine 25 MG tablet Commonly known as: ATARAX Take 1 tablet (25 mg total) by mouth 3 (three) times daily as needed for anxiety.   latanoprost 0.005 % ophthalmic solution Commonly known as: XALATAN Place 1 drop into both eyes at bedtime.   loratadine 10 MG tablet Commonly known  as: Claritin Take 1 tablet (10 mg total) by mouth daily.   pantoprazole 40 MG tablet Commonly known as: Protonix Take 1 tablet (40 mg total) by mouth daily.   predniSONE 20 MG tablet Commonly known as: DELTASONE Take 2 tablets (40 mg total) by mouth daily with breakfast for 4 days. Start taking on: March 22, 2023        Follow-up Information     [place], Betty G, MD Follow up.   Specialty: Family Medicine Contact information: [address] [place] [address] [phone]                Allergies  Allergen Reactions   Milk-Related Compounds     Mouth itching, dizziness, coughing, wheezing and SOB   Other Anaphylaxis    Archie Balboa rancher gummies and paprika    Peanuts [Peanut Oil] Anaphylaxis and Itching   Corn Dextrin Other (See Comments)    Plain corn ok, popcorn causes hives   Gluten Meal Other (See Comments)    cough   Shellfish Allergy Itching   Soy Allergy Other (See Comments)    coughing   Sunflower Oil Itching   Tomato Itching   Wheat     swelling   Morphine Itching and Rash    Consultations: PCCM  Neurology  Procedures/Studies: EEG adult  Result Date: 03-27-2023 Charlsie Quest, MD     03/27/23  2:34 PM Patient Name: Asako Allegro MRN: [id number] Epilepsy Attending: Charlsie Quest Referring Physician/Provider: Raechel Chute, MD Date: 03/27/2023 Duration: 27.13 mins Patient history: 57yo F with an episode of seizure-like activity. EEG to evaluate for seizure Level of alertness: Awake, asleep AEDs during EEG study: Ativan Technical aspects: This EEG study was done with scalp electrodes positioned according to the 10-20 International system of electrode placement. Electrical activity was reviewed with band pass filter of 1-70Hz , sensitivity of 7 uV/mm, display speed of 103mm/sec with a 60Hz  notched filter applied as appropriate. EEG data were recorded continuously and digitally stored.  Video monitoring was available and reviewed as  appropriate. Description: The posterior dominant rhythm consists of 9 Hz activity of moderate voltage (25-35 uV) seen predominantly in posterior head regions, symmetric and reactive to eye opening and eye closing. Sleep was characterized by vertex waves, sleep spindles (12 to 14 Hz), maximal frontocentral region. Low amplitude 13-15hz  beta activity was noted symmetrically and diffusely. Physiologic photic driving was not seen during photic stimulation. Hyperventilation and photic stimulation were not performed.   IMPRESSION: This study is within normal limits. No seizures or epileptiform discharges were seen throughout the recording. A normal interictal EEG does not exclude the diagnosis of epilepsy. Charlsie Quest   DG CHEST PORT 1 VIEW  Result Date: 2023-03-27 CLINICAL DATA:  Shortness of breath EXAM: PORTABLE CHEST 1 VIEW COMPARISON:  12/08/2017 FINDINGS: Artifact from EKG leads. Normal heart size and mediastinal contours. No acute infiltrate or edema. No effusion or pneumothorax. No acute osseous findings. IMPRESSION: No active disease. Electronically Signed   By: Tiburcio Pea M.D.   On: 03/27/2023 04:48     Subjective: Patient seen examined bedside, resting calmly.  Lying in bed.  Symptoms markedly improved.  Tolerating diet, IV steroids transition to prednisone today.  Ready for discharge home.  Denies headache, no visual changes, no chest pain, no palpitations, no shortness of breath, no abdominal pain, no fever/chills/night sweats, no nausea cefonicid diarrhea, no focal weakness, no cough/congestion, no paresthesias.  No acute events overnight per nurse staff.  Discharge Exam: Vitals:   03/21/23 0800 03/21/23 0900  BP: (!) 136/92 121/85  Pulse: 76 69  Resp: 11 13  Temp:    SpO2: 98% 98%   Vitals:   03/21/23 0700 03/21/23 0751 03/21/23 0800 03/21/23 0900  BP:   (!) 136/92 121/85  Pulse: 87  76 69  Resp: 17  11 13   Temp:  98.3 F (36.8 C)    TempSrc:  Oral    SpO2: 99%  98% 98%   Weight:      Height:        Physical Exam: GEN: NAD, alert and oriented x 3, obese HEENT: NCAT, PERRL, EOMI, sclera clear, MMM PULM: CTAB w/o wheezes/crackles, normal respiratory effort, on room air CV: RRR w/o M/G/R GI: abd soft, NTND, NABS, no R/G/M MSK: no peripheral edema, muscle strength globally intact 5/5 bilateral upper/lower extremities NEURO: CN II-XII intact, no focal deficits, sensation to light touch intact PSYCH: normal mood/affect Integumentary: dry/intact, no rashes or wounds    The results of significant diagnostics from this hospitalization (including imaging, microbiology, ancillary and laboratory) are listed below for reference.     Microbiology: Recent Results (from the past 240 hour(s))  MRSA Next Gen by PCR, Nasal     Status: None   Collection Time: 03/27/2023  5:38 AM  Specimen: Nasal Mucosa; Nasal Swab  Result Value Ref Range Status   MRSA by PCR Next Gen NOT DETECTED NOT DETECTED Final    Comment: (NOTE) The GeneXpert MRSA Assay (FDA approved for NASAL specimens only), is one component of a comprehensive MRSA colonization surveillance program. It is not intended to diagnose MRSA infection nor to guide or monitor treatment for MRSA infections. Test performance is not FDA approved in patients less than 48 years old. Performed at [facility], [address] [place], [place] [address]      Labs: BNP (last 3 results) No results for input(s): "BNP" in the last 8760 hours. Basic Metabolic Panel: Recent Labs  Lab 03/18/23 2205 03/18/23 2207 03/19/23 0508  NA 144 143 142  K 2.6* 2.5* 3.9  CL 109 106 108  CO2 21*  --  20*  GLUCOSE 171* 168* 157*  BUN 8 7 8   CREATININE 0.97 0.90 0.85  CALCIUM 9.5  --  10.0   Liver Function Tests: Recent Labs  Lab 03/18/23 2205  AST 15  ALT 11  ALKPHOS 64  BILITOT 0.3  PROT 7.2  ALBUMIN 3.6   No results for input(s): "LIPASE", "AMYLASE" in the last 168 hours. No results for input(s): "AMMONIA"  in the last 168 hours. CBC: Recent Labs  Lab 03/18/23 2205 03/18/23 2207  WBC 9.4  --   NEUTROABS 3.3  --   HGB 13.7 13.6  HCT 41.3 40.0  MCV 88.8  --   PLT 256  --    Cardiac Enzymes: No results for input(s): "CKTOTAL", "CKMB", "CKMBINDEX", "TROPONINI" in the last 168 hours. BNP: Invalid input(s): "POCBNP" CBG: Recent Labs  Lab 03/19/23 0528 03/20/23 0732  GLUCAP 149* 132*   D-Dimer No results for input(s): "DDIMER" in the last 72 hours. Hgb A1c No results for input(s): "HGBA1C" in the last 72 hours. Lipid Profile No results for input(s): "CHOL", "HDL", "LDLCALC", "TRIG", "CHOLHDL", "LDLDIRECT" in the last 72 hours. Thyroid function studies No results for input(s): "TSH", "T4TOTAL", "T3FREE", "THYROIDAB" in the last 72 hours.  Invalid input(s): "FREET3" Anemia work up No results for input(s): "VITAMINB12", "FOLATE", "FERRITIN", "TIBC", "IRON", "RETICCTPCT" in the last 72 hours. Urinalysis    Component Value Date/Time   COLORURINE YELLOW 07/23/2014 1208   APPEARANCEUR CLEAR 07/23/2014 1208   LABSPEC 1.020 07/23/2014 1208   PHURINE 6.0 07/23/2014 1208   GLUCOSEU NEGATIVE 07/23/2014 1208   HGBUR SMALL (A) 07/23/2014 1208   BILIRUBINUR NEGATIVE 07/23/2014 1208   KETONESUR NEGATIVE 07/23/2014 1208   PROTEINUR NEGATIVE 04/21/2011 0624   UROBILINOGEN 0.2 07/23/2014 1208   NITRITE NEGATIVE 07/23/2014 1208   LEUKOCYTESUR SMALL (A) 07/23/2014 1208   Sepsis Labs Recent Labs  Lab 03/18/23 2205  WBC 9.4   Microbiology Recent Results (from the past 240 hour(s))  MRSA Next Gen by PCR, Nasal     Status: None   Collection Time: 03/19/23  5:38 AM   Specimen: Nasal Mucosa; Nasal Swab  Result Value Ref Range Status   MRSA by PCR Next Gen NOT DETECTED NOT DETECTED Final    Comment: (NOTE) The GeneXpert MRSA Assay (FDA approved for NASAL specimens only), is one component of a comprehensive MRSA colonization surveillance program. It is not intended to diagnose MRSA  infection nor to guide or monitor treatment for MRSA infections. Test performance is not FDA approved in patients less than 70 years old. Performed at [facility], [address] [place], [place] [address]  Time coordinating discharge: Over 30 minutes  SIGNED:   Alvira Philips [place], DO  [facility] 03/21/2023, 10:54 AM

## 2023-03-22 ENCOUNTER — Telehealth: Payer: Self-pay

## 2023-03-22 ENCOUNTER — Other Ambulatory Visit (HOSPITAL_COMMUNITY): Payer: Self-pay

## 2023-03-22 LAB — C3 COMPLEMENT: C3 Complement: 185 mg/dL — ABNORMAL HIGH (ref 82–167)

## 2023-03-22 LAB — C4 COMPLEMENT: Complement C4, Body Fluid: 40 mg/dL — ABNORMAL HIGH (ref 12–38)

## 2023-03-22 NOTE — Transitions of Care (Post Inpatient/ED Visit) (Signed)
03/22/2023  Name: Kenslee Chromy MRN: [id number] DOB: 1965/11/18  Today's TOC FU Call Status: Today's TOC FU Call Status:: Unsuccessful Call (1st Attempt) Unsuccessful Call (1st Attempt) Date: 03/22/23  Attempted to reach the patient regarding the most recent Inpatient/ED visit.  Follow Up Plan: Additional outreach attempts will be made to reach the patient to complete the Transitions of Care (Post Inpatient/ED visit) call.     Antionette Fairy, RN,BSN,CCM RN Care Manager Transitions of Care  [place]  Direct Phone: [phone] Toll Free: [phone] Fax: [phone]

## 2023-03-23 ENCOUNTER — Telehealth: Payer: Self-pay

## 2023-03-23 ENCOUNTER — Other Ambulatory Visit (HOSPITAL_COMMUNITY): Payer: Self-pay

## 2023-03-23 NOTE — Transitions of Care (Post Inpatient/ED Visit) (Signed)
03/23/2023  Name: Megan Wilkerson MRN: [id number] DOB: Mar 23, 1966  Today's TOC FU Call Status: Today's TOC FU Call Status:: Unsuccessful Call (3rd Attempt) Unsuccessful Call (3rd Attempt) Date: 03/23/23  Attempted to reach the patient regarding the most recent Inpatient/ED visit.  Follow Up Plan: No further outreach attempts will be made at this time. We have been unable to contact the patient.    Antionette Fairy, RN,BSN,CCM RN Care Manager Transitions of Care  [place]  Direct Phone: [phone] Toll Free: [phone] Fax: [phone]

## 2023-03-23 NOTE — Transitions of Care (Post Inpatient/ED Visit) (Signed)
03/23/2023  Name: Megan Wilkerson MRN: [id number] DOB: [birth year]  Today's TOC FU Call Status: Today's TOC FU Call Status:: Unsuccessful Call (2nd Attempt) Unsuccessful Call (2nd Attempt) Date: 03/23/23  Attempted to reach the patient regarding the most recent Inpatient/ED visit.  Follow Up Plan: Additional outreach attempts will be made to reach the patient to complete the Transitions of Care (Post Inpatient/ED visit) call.     Antionette Fairy, RN,BSN,CCM RN Care Manager Transitions of Care  [place]  Direct Phone: [phone] Toll Free: [phone] Fax: [phone]

## 2023-04-04 ENCOUNTER — Ambulatory Visit (HOSPITAL_COMMUNITY): Payer: Medicaid Other | Admitting: Student

## 2023-04-05 ENCOUNTER — Ambulatory Visit (INDEPENDENT_AMBULATORY_CARE_PROVIDER_SITE_OTHER): Payer: Medicaid Other | Admitting: Family Medicine

## 2023-04-05 ENCOUNTER — Encounter: Payer: Self-pay | Admitting: Family Medicine

## 2023-04-05 VITALS — BP 128/80 | HR 100 | Temp 98.7°F | Resp 16 | Ht 66.0 in | Wt 233.2 lb

## 2023-04-05 DIAGNOSIS — G4733 Obstructive sleep apnea (adult) (pediatric): Secondary | ICD-10-CM

## 2023-04-05 DIAGNOSIS — J4540 Moderate persistent asthma, uncomplicated: Secondary | ICD-10-CM | POA: Diagnosis not present

## 2023-04-05 DIAGNOSIS — E785 Hyperlipidemia, unspecified: Secondary | ICD-10-CM

## 2023-04-05 DIAGNOSIS — E1169 Type 2 diabetes mellitus with other specified complication: Secondary | ICD-10-CM | POA: Diagnosis not present

## 2023-04-05 DIAGNOSIS — Z13220 Encounter for screening for lipoid disorders: Secondary | ICD-10-CM

## 2023-04-05 DIAGNOSIS — T7800XA Anaphylactic reaction due to unspecified food, initial encounter: Secondary | ICD-10-CM | POA: Diagnosis not present

## 2023-04-05 DIAGNOSIS — E876 Hypokalemia: Secondary | ICD-10-CM | POA: Diagnosis not present

## 2023-04-05 DIAGNOSIS — R7303 Prediabetes: Secondary | ICD-10-CM | POA: Insufficient documentation

## 2023-04-05 DIAGNOSIS — K219 Gastro-esophageal reflux disease without esophagitis: Secondary | ICD-10-CM

## 2023-04-05 LAB — HEMOGLOBIN A1C: Hgb A1c MFr Bld: 6.7 % — ABNORMAL HIGH (ref 4.6–6.5)

## 2023-04-05 LAB — LIPID PANEL
Cholesterol: 199 mg/dL (ref 0–200)
HDL: 38.6 mg/dL — ABNORMAL LOW (ref >39.00)
LDL Cholesterol: 135 mg/dL — ABNORMAL HIGH (ref 0–99)
NonHDL: 160.22
Total CHOL/HDL Ratio: 5
Triglycerides: 124 mg/dL (ref 0.0–149.0)
VLDL: 24.8 mg/dL (ref 0.0–40.0)

## 2023-04-05 LAB — BASIC METABOLIC PANEL
BUN: 8 mg/dL (ref 6–23)
CO2: 27 meq/L (ref 19–32)
Calcium: 9.9 mg/dL (ref 8.4–10.5)
Chloride: 106 meq/L (ref 96–112)
Creatinine, Ser: 1.01 mg/dL (ref 0.40–1.20)
GFR: 62.29 mL/min (ref >60.00)
Glucose, Bld: 137 mg/dL — ABNORMAL HIGH (ref 70–99)
Potassium: 3.2 meq/L — ABNORMAL LOW (ref 3.5–5.1)
Sodium: 142 meq/L (ref 135–145)

## 2023-04-05 MED ORDER — ALBUTEROL SULFATE HFA 108 (90 BASE) MCG/ACT IN AERS: 2.0000 | INHALATION_SPRAY | Freq: Four times a day (QID) | RESPIRATORY_TRACT | 0 refills | Status: DC | PRN

## 2023-04-05 NOTE — Patient Instructions (Addendum)
A few things to remember from today's visit:  Moderate persistent asthma without complication - Plan: Ambulatory referral to Immunology, albuterol (VENTOLIN HFA) 108 (90 Base) MCG/ACT inhaler  Gastroesophageal reflux disease, unspecified whether esophagitis present  Anaphylaxis due to food - Plan: Ambulatory referral to Immunology  Obstructive sleep apnea  Prediabetes - Plan: Hemoglobin A1c  Screening for lipid disorders - Plan: Lipid panel  Hypokalemia - Plan: Basic metabolic panel  Continue daily activities as tolerated. Appt with allergy specialist will be arranged. No changes in current medications. Albuterol inh 2 puff every 6 hours for a week then as needed for wheezing or shortness of breath.   If you need refills for medications you take chronically, please call your pharmacy. Do not use My Chart to request refills or for acute issues that need immediate attention. If you send a my chart message, it may take a few days to be addressed, specially if I am not in the office.  Please be sure medication list is accurate. If a new problem present, please set up appointment sooner than planned today.

## 2023-04-05 NOTE — Progress Notes (Signed)
HPI: Megan Wilkerson is a 57 y.o. female with a PMHx significant for asthma, OSA, GERD, seizures, peripheral neuropathy, and anaphylaxis, who is here today to establish care and follow up on a recent hospital visit.   Former PCP: Hillard Danker, MD Last preventive routine visit: 09/13/2019  Chronic medical problems:   Anxiety:  She is currently on Buspar 10 mg daily and hydroxyzine 25 mg 3x daily prn for anxiety.  She says she doesn't need to take the medication often.   Asthma:  She is currently on Symbicort 80-45. Mcg/act inhaler 2 puffs bid.  She has been having intermittent wheezing since her hospital visit on 10/19.   GERD:  She is currently on pantoprazole 40 mg daily.   OSA:  She has a CPAP but does not use it.   She says her bowel movements have been normal.   Concerns today:   Anaphylaxis: *** Patient was hospitalized on 10/19 for anaphylaxis after eating pepperoni pizza. She said her symptoms started 10-15 minutes after eating the pizza.  Her symptoms included throat swelling and SOB. She also had a second episode in the hospital after eating sausage.  In the hospital she was given and epinephrine drip.  She also mentions she had an instance of double vision in the hospital.   She states she has had some recent headaches and chest pain at home.   Review of Systems See other pertinent positives and negatives in HPI.  Current Outpatient Medications on File Prior to Visit  Medication Sig Dispense Refill   brimonidine (ALPHAGAN) 0.15 % ophthalmic solution Place 1 drop into both eyes 2 (two) times daily.     budesonide-formoterol (SYMBICORT) 80-4.5 MCG/ACT inhaler Inhale 2 puffs into the lungs 2 (two) times daily. 10.2 g 3   busPIRone (BUSPAR) 10 MG tablet Take 1 tablet (10 mg total) by mouth 2 (two) times daily. 180 tablet 0   EPINEPHrine 0.3 mg/0.3 mL IJ SOAJ injection Inject 0.3 mg into the muscle as needed for anaphylaxis. 2 each 0   hydrOXYzine (ATARAX)  25 MG tablet Take 1 tablet (25 mg total) by mouth 3 (three) times daily as needed for anxiety. 90 tablet 0   latanoprost (XALATAN) 0.005 % ophthalmic solution Place 1 drop into both eyes at bedtime.      loratadine (CLARITIN) 10 MG tablet Take 1 tablet (10 mg total) by mouth daily. 90 tablet 0   pantoprazole (PROTONIX) 40 MG tablet Take 1 tablet (40 mg total) by mouth daily. 90 tablet 0   No current facility-administered medications on file prior to visit.    Past Medical History:  Diagnosis Date   Allergy 2012   NUMEROUS ALLERGIES AND ANAPHYLACTIC REACTIONS SINCE 2012   Anaphylactic reaction    several ED visits for same since 01/2012   Anemia    IDA    Asthma    Blood transfusion without reported diagnosis 1988   AFTER  CHILDBIRTH   GERD    Glaucoma    Hives of unknown origin    chronic and recurrent urticaria since 01/2012   HYPERLIPIDEMIA, WITH LOW HDL    MIGRAINE HEADACHE    PERIPHERAL NEUROPATHY    POSITIVE PPD 1987   s/p 56mo tx   Seizures (HCC)    Sleep apnea 2018   CPAP   Allergies  Allergen Reactions   Milk-Related Compounds     Mouth itching, dizziness, coughing, wheezing and SOB   Other Anaphylaxis    Archie Balboa rancher gummies and paprika  Peanuts [Peanut Oil] Anaphylaxis and Itching   Corn Dextrin Other (See Comments)    Plain corn ok, popcorn causes hives   Gluten Meal Other (See Comments)    cough   Shellfish Allergy Itching   Soy Allergy Other (See Comments)    coughing   Sunflower Oil Itching   Tomato Itching   Wheat     swelling   Morphine Itching and Rash    Family History  Problem Relation Age of Onset   Prostate cancer Father    Hypertension Father    Heart disease Father    Diabetes Mother    Diabetes Brother    Alcohol abuse Other    Arthritis Other    Hypertension Other    Seizures Sister    Colon cancer Neg Hx    Colon polyps Neg Hx    Esophageal cancer Neg Hx    Rectal cancer Neg Hx    Stomach cancer Neg Hx     Social  History   Socioeconomic History   Marital status: Married    Spouse name: Hector Venne   Number of children: Not on file   Years of education: Not on file   Highest education level: Not on file  Occupational History   Not on file  Tobacco Use   Smoking status: Never   Smokeless tobacco: Never  Vaping Use   Vaping status: Never Used  Substance and Sexual Activity   Alcohol use: No   Drug use: No   Sexual activity: Not on file  Other Topics Concern   Not on file  Social History Narrative   Married, lives with spouse, son and nephew   Futures trader, 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  There is no height or weight on file to calculate BMI.  Physical Exam Vitals and nursing note reviewed.  Constitutional:      General: She is not in acute distress.    Appearance: She is well-developed.  HENT:     Head: Normocephalic and atraumatic.     Mouth/Throat:     Mouth: Mucous membranes are moist.     Pharynx: Oropharynx is clear. Uvula midline.     Comments: *** Eyes:     Conjunctiva/sclera: Conjunctivae normal.  Cardiovascular:     Rate and Rhythm: Normal rate and regular rhythm.     Pulses:          Dorsalis pedis pulses are 2+ on the right side and 2+ on the left side.     Heart sounds: No murmur heard. Pulmonary:     Effort: Pulmonary effort is normal. No respiratory distress.     Breath sounds: Normal breath sounds.  Abdominal:     Palpations: Abdomen is soft. There is no hepatomegaly or mass.     Tenderness: There is no abdominal  tenderness.  Musculoskeletal:     Right lower leg: No edema.     Left lower leg: No edema.  Lymphadenopathy:     Cervical:  No cervical adenopathy.  Skin:    General: Skin is warm.     Findings: No erythema or rash.  Neurological:     General: No focal deficit present.     Mental Status: She is alert and oriented to person, place, and time.     Cranial Nerves: No cranial nerve deficit.     Gait: Gait normal.  Psychiatric:     Comments: Well groomed, good eye contact.     ASSESSMENT AND PLAN:  Megan Wilkerson was seen today to establish care and follow on a recent hospital visit.   There are no diagnoses linked to this encounter.  No follow-ups on file.  I, Suanne Marker, acting as a scribe for Declyn Delsol [place], MD., have documented all relevant documentation on the behalf of Janisa Labus [place], MD, as directed by  Diron Haddon [place], MD while in the presence of Cieara Stierwalt [place], MD.   I, Suanne Marker, have reviewed all documentation for this visit. The documentation on 04/05/23 for the exam, diagnosis, procedures, and orders are all accurate and complete.  Clary Boulais G. [place], MD  [facility]. [place] office.

## 2023-04-06 MED ORDER — POTASSIUM CHLORIDE CRYS ER 20 MEQ PO TBCR: 20.0000 meq | EXTENDED_RELEASE_TABLET | Freq: Every day | ORAL | 1 refills | Status: DC

## 2023-04-06 NOTE — Assessment & Plan Note (Signed)
We discussed Dx and possible complications. She is not interested in wearing CPAP. Wt loss will help.

## 2023-04-06 NOTE — Assessment & Plan Note (Signed)
Problem is stable. Continue Pantoprazole 40 mg daily and GERD precautions.

## 2023-04-06 NOTE — Assessment & Plan Note (Signed)
Recently hospitalized for episode of anaphylaxis. Not sure about trigger factors. She has epi pen. Appt with immunologist will be arranged.

## 2023-04-06 NOTE — Assessment & Plan Note (Signed)
Non pharmacologic treatment recommended for now. Further recommendations will be given according to 10 years CVD risk score and lipid panel numbers. 

## 2023-04-06 NOTE — Assessment & Plan Note (Signed)
She has seen pulmonologist, Dr Maple Hudson. For now no changes in Symbicort dose, 80-4.5 mcg bid. Albuterol inh 2 puff every 6 hours for a week then as needed for wheezing or shortness of breath.  Instructed about warning signs. Appt with immunologist will be arranged.

## 2023-04-06 NOTE — Assessment & Plan Note (Signed)
A healthy life style encouraged for diabetes prevention. HgA1C 6.4 in 08/2019. Further recommendations according to HgA1C result.

## 2023-04-07 ENCOUNTER — Other Ambulatory Visit: Payer: Self-pay

## 2023-04-07 MED ORDER — ROSUVASTATIN CALCIUM 10 MG PO TABS: 10.0000 mg | ORAL_TABLET | Freq: Every day | ORAL | 3 refills | Status: DC

## 2023-04-27 ENCOUNTER — Other Ambulatory Visit: Payer: Self-pay | Admitting: Family Medicine

## 2023-04-27 DIAGNOSIS — J4540 Moderate persistent asthma, uncomplicated: Secondary | ICD-10-CM

## 2023-05-01 ENCOUNTER — Telehealth: Payer: Self-pay

## 2023-05-01 MED ORDER — ONETOUCH ULTRA VI STRP: ORAL_STRIP | 12 refills | Status: DC

## 2023-05-01 MED ORDER — ONETOUCH ULTRA 2 W/DEVICE KIT: PACK | 0 refills | Status: DC

## 2023-05-01 MED ORDER — ONETOUCH ULTRASOFT LANCETS MISC: 12 refills | Status: DC

## 2023-05-01 NOTE — Telephone Encounter (Signed)
I called pt to schedule her potassium recheck, she will call back to schedule this once she checks her schedule. She did mention that she has been dizzy and shaky in the mornings when she wakes up, advised it could be due to low blood sugar. Pt has a recent dx of diabetes with an A1C of 6.7, advised we can send in a glucose monitor for her to check her blood sugar. She will check it in the mornings and let us know if that is causing the symptoms. She was advised that she could have a sip of a sugary drink or something sweet to help bring her level back up. She will call us back if it continues.   Rx sent in & lab order placed.

## 2023-05-01 NOTE — Telephone Encounter (Signed)
-----   Message from [facility] sent at 04/07/2023 11:22 AM EST ----- Potassium in 3 weeks.

## 2023-05-03 ENCOUNTER — Other Ambulatory Visit (INDEPENDENT_AMBULATORY_CARE_PROVIDER_SITE_OTHER): Payer: Medicaid Other

## 2023-05-03 DIAGNOSIS — E876 Hypokalemia: Secondary | ICD-10-CM | POA: Diagnosis not present

## 2023-05-03 LAB — POTASSIUM: Potassium: 4.1 meq/L (ref 3.5–5.1)

## 2023-05-08 ENCOUNTER — Telehealth: Payer: Self-pay | Admitting: Family Medicine

## 2023-05-08 MED ORDER — ACCU-CHEK SOFTCLIX LANCETS MISC: 12 refills | Status: DC

## 2023-05-08 MED ORDER — ACCU-CHEK GUIDE W/DEVICE KIT: PACK | 0 refills | Status: DC

## 2023-05-08 MED ORDER — ACCU-CHEK GUIDE TEST VI STRP: ORAL_STRIP | 12 refills | Status: DC

## 2023-05-08 NOTE — Telephone Encounter (Signed)
Says Blood Glucose Monitoring Suppl (ONE TOUCH ULTRA 2) w/Device KIT is not covered under her insurance, says Accu Chek or Freestyle are covered

## 2023-05-08 NOTE — Addendum Note (Signed)
Addended by: Weyman Croon E on: 05/08/2023 11:11 AM   Modules accepted: Orders

## 2023-05-08 NOTE — Telephone Encounter (Signed)
New Rx's sent in for accu-chek Guide.

## 2023-05-15 ENCOUNTER — Other Ambulatory Visit (HOSPITAL_COMMUNITY): Payer: Self-pay

## 2023-05-31 ENCOUNTER — Other Ambulatory Visit: Payer: Self-pay | Admitting: Family Medicine

## 2023-06-19 ENCOUNTER — Encounter: Payer: Medicaid Other | Attending: Family Medicine | Admitting: Dietician

## 2023-06-19 DIAGNOSIS — E1169 Type 2 diabetes mellitus with other specified complication: Secondary | ICD-10-CM | POA: Insufficient documentation

## 2023-06-19 NOTE — Patient Instructions (Signed)
1- Walk 10-30 minutes aiming for 5-6 days weekly

## 2023-06-19 NOTE — Progress Notes (Signed)
Diabetes Self-Management Education  Visit Type: First/Initial  Appt. Start Time: 1110  Appt. End Time: 1210  06/19/2023  Megan Wilkerson, identified by name and date of birth, is a 58 y.o. female with a diagnosis of Diabetes: Type 2.   ASSESSMENT   Patient is here today alone. Patient lives with with her two sons. Patient would like to learn what to eat with diabetes. Pt reports she has made the following changes including decreasing chocolate. Pt reports she shares shopping and cooking. Pt reports she has no difficulty with avoiding her food allergies. All Pt's questions were questions were answered during today's encounter.    History includes:   Past Medical History:  Diagnosis Date   Allergy 2012   NUMEROUS ALLERGIES AND ANAPHYLACTIC REACTIONS SINCE 2012   Anaphylactic reaction    several ED visits for same since 01/2012   Anemia    IDA    Asthma    Blood transfusion without reported diagnosis 1988   AFTER  CHILDBIRTH   GERD    Glaucoma    Hives of unknown origin    chronic and recurrent urticaria since 01/2012   HYPERLIPIDEMIA, WITH LOW HDL    MIGRAINE HEADACHE    PERIPHERAL NEUROPATHY    POSITIVE PPD 1987   s/p 40mo tx   Seizures (HCC)    Sleep apnea 2018   CPAP    Labs noted:   Lab Results  Component Value Date   HGBA1C 6.7 (H) 04/05/2023   Lab Results  Component Value Date   CHOL 199 04/05/2023   HDL 38.60 (L) 04/05/2023   LDLCALC 135 (H) 04/05/2023   LDLDIRECT 121.0 07/23/2014   TRIG 124.0 04/05/2023   CHOLHDL 5 04/05/2023   Medications include:   BP Readings from Last 3 Encounters:  04/05/23 128/80  03/21/23 110/78  05/13/21 120/82    There were no vitals taken for this visit. There is no height or weight on file to calculate BMI.   Diabetes Self-Management Education - 06/19/23 1106       Visit Information   Visit Type First/Initial      Initial Visit   Diabetes Type Type 2    Date Diagnosed 2024    Are you currently following a  meal plan? No    Are you taking your medications as prescribed? Not on Medications      Health Coping   How would you rate your overall health? Fair      Psychosocial Assessment   Patient Belief/Attitude about Diabetes Motivated to manage diabetes    What is the hardest part about your diabetes right now, causing you the most concern, or is the most worrisome to you about your diabetes?   Checking blood sugar    Self-care barriers None    Self-management support Doctor's office    Other persons present Patient    Patient Concerns Nutrition/Meal planning;Monitoring;Problem Solving    Special Needs None    Preferred Learning Style No preference indicated    Learning Readiness Change in progress    How often do you need to have someone help you when you read instructions, pamphlets, or other written materials from your doctor or pharmacy? 1 - Never    What is the last grade level you completed in school? 12th      Pre-Education Assessment   Patient understands the diabetes disease and treatment process. Needs Instruction    Patient understands incorporating nutritional management into lifestyle. Needs Instruction  Patient undertands incorporating physical activity into lifestyle. Needs Instruction    Patient understands using medications safely. Needs Instruction    Patient understands monitoring blood glucose, interpreting and using results Needs Instruction    Patient understands prevention, detection, and treatment of acute complications. Needs Instruction    Patient understands prevention, detection, and treatment of chronic complications. Needs Instruction    Patient understands how to develop strategies to address psychosocial issues. Needs Instruction    Patient understands how to develop strategies to promote health/change behavior. Needs Instruction      Complications   Last HgB A1C per patient/outside source 6.7 %    How often do you check your blood sugar? 1-2 times/day     Fasting Blood glucose range (mg/dL) 29-562    Postprandial Blood glucose range (mg/dL) >130    Number of hypoglycemic episodes per month 0    Number of hyperglycemic episodes ( >200mg /dL): Rare    Can you tell when your blood sugar is high? Yes    What do you do if your blood sugar is high? dizzy, confused    Have you had a dilated eye exam in the past 12 months? Yes    Have you had a dental exam in the past 12 months? No    Are you checking your feet? Yes    How many days per week are you checking your feet? 3      Dietary Intake   Breakfast eggs, potatoes or white rice, mini pepsi or water    Lunch hot dog plain no bun, kool aid with sugar    Dinner hamburger, mashed potatos, green beans, kool aid, water    Snack (evening) low sugar jello or fruit    Beverage(s) water, pepsi, kool aid made with sugar      Activity / Exercise   Activity / Exercise Type ADL's      Patient Education   Previous Diabetes Education Yes (please comment)    Disease Pathophysiology Definition of diabetes, type 1 and 2, and the diagnosis of diabetes    Healthy Eating Plate Method;Food label reading, portion sizes and measuring food.;Carbohydrate counting    Being Active Role of exercise on diabetes management, blood pressure control and cardiac health.    Medications Reviewed patients medication for diabetes, action, purpose, timing of dose and side effects.    Monitoring Identified appropriate SMBG and/or A1C goals.    Acute complications Taught prevention, symptoms, and  treatment of hypoglycemia - the 15 rule.    Chronic complications Relationship between chronic complications and blood glucose control;Assessed and discussed foot care and prevention of foot problems    Diabetes Stress and Support Identified and addressed patients feelings and concerns about diabetes;Role of stress on diabetes    Preconception care Other (comment)    Lifestyle and Health Coping Lifestyle issues that need to be addressed for  better diabetes care      Individualized Goals (developed by patient)   Nutrition Follow meal plan discussed    Medications Not Applicable    Monitoring  Test blood glucose pre and post meals as discussed    Problem Solving Eating Pattern    Reducing Risk examine blood glucose patterns;do foot checks daily    Health Coping Ask for help with psychological, social, or emotional issues      Post-Education Assessment   Patient understands the diabetes disease and treatment process. Needs Review    Patient understands incorporating nutritional management into lifestyle. Needs Review  Patient undertands incorporating physical activity into lifestyle. Needs Review    Patient understands using medications safely. Needs Instruction    Patient understands monitoring blood glucose, interpreting and using results Needs Review    Patient understands prevention, detection, and treatment of acute complications. Needs Review    Patient understands prevention, detection, and treatment of chronic complications. Needs Review    Patient understands how to develop strategies to address psychosocial issues. Needs Review    Patient understands how to develop strategies to promote health/change behavior. Needs Review      Outcomes   Expected Outcomes Demonstrated interest in learning. Expect positive outcomes    Future DMSE 3-4 months    Program Status Not Completed             Individualized Plan for Diabetes Self-Management Training:   Learning Objective:  Patient will have a greater understanding of diabetes self-management. Patient education plan is to attend individual and/or group sessions per assessed needs and concerns.   Plan:   Patient Instructions  1- Walk 10-30 minutes aiming for 5-6 days weekly  Expected Outcomes:  Demonstrated interest in learning. Expect positive outcomes  Education material provided: ADA - How to Thrive: A Guide for Your Journey with Diabetes, My Plate, and  Snack sheet  If problems or questions, patient to contact team via:  Phone  Future DSME appointment: 3-4 months

## 2023-07-07 ENCOUNTER — Other Ambulatory Visit (HOSPITAL_COMMUNITY): Payer: Self-pay

## 2023-07-07 ENCOUNTER — Ambulatory Visit (INDEPENDENT_AMBULATORY_CARE_PROVIDER_SITE_OTHER): Payer: Medicaid Other | Admitting: Family Medicine

## 2023-07-07 ENCOUNTER — Telehealth: Payer: Self-pay | Admitting: Pharmacy Technician

## 2023-07-07 ENCOUNTER — Encounter: Payer: Self-pay | Admitting: Family Medicine

## 2023-07-07 VITALS — BP 126/80 | HR 96 | Resp 16 | Ht 66.0 in | Wt 230.5 lb

## 2023-07-07 DIAGNOSIS — Z7985 Long-term (current) use of injectable non-insulin antidiabetic drugs: Secondary | ICD-10-CM

## 2023-07-07 DIAGNOSIS — E785 Hyperlipidemia, unspecified: Secondary | ICD-10-CM | POA: Diagnosis not present

## 2023-07-07 DIAGNOSIS — E876 Hypokalemia: Secondary | ICD-10-CM

## 2023-07-07 DIAGNOSIS — E1169 Type 2 diabetes mellitus with other specified complication: Secondary | ICD-10-CM

## 2023-07-07 DIAGNOSIS — Z23 Encounter for immunization: Secondary | ICD-10-CM

## 2023-07-07 LAB — BASIC METABOLIC PANEL
BUN: 11 mg/dL (ref 6–23)
CO2: 23 meq/L (ref 19–32)
Calcium: 9.7 mg/dL (ref 8.4–10.5)
Chloride: 108 meq/L (ref 96–112)
Creatinine, Ser: 0.97 mg/dL (ref 0.40–1.20)
GFR: 65.27 mL/min (ref >60.00)
Glucose, Bld: 125 mg/dL — ABNORMAL HIGH (ref 70–99)
Potassium: 4.1 meq/L (ref 3.5–5.1)
Sodium: 144 meq/L (ref 135–145)

## 2023-07-07 LAB — LIPID PANEL
Cholesterol: 109 mg/dL (ref 0–200)
HDL: 42.7 mg/dL (ref >39.00)
LDL Cholesterol: 49 mg/dL (ref 0–99)
NonHDL: 66.54
Total CHOL/HDL Ratio: 3
Triglycerides: 87 mg/dL (ref 0.0–149.0)
VLDL: 17.4 mg/dL (ref 0.0–40.0)

## 2023-07-07 LAB — POCT GLYCOSYLATED HEMOGLOBIN (HGB A1C): HbA1c, POC (prediabetic range): 6 % (ref 5.7–6.4)

## 2023-07-07 LAB — MAGNESIUM: Magnesium: 2.1 mg/dL (ref 1.5–2.5)

## 2023-07-07 MED ORDER — OZEMPIC (0.25 OR 0.5 MG/DOSE) 2 MG/3ML ~~LOC~~ SOPN: PEN_INJECTOR | SUBCUTANEOUS | 1 refills | Status: DC

## 2023-07-07 NOTE — Telephone Encounter (Signed)
 Pharmacy Patient Advocate Encounter   Received notification from CoverMyMeds that prior authorization for Ozempic  (0.25 or 0.5 MG/DOSE) 2MG /3ML pen-injectors is required/requested.   Insurance verification completed.   The patient is insured through [facility] MEDICAID .   Per test claim: PA required; PA submitted to above mentioned insurance via CoverMyMeds Key/confirmation #/EOC [id number] Status is pending

## 2023-07-07 NOTE — Assessment & Plan Note (Signed)
 HgA1C went from 6.7 to 6.0. She is interested in pharmacologic treatment. Ozempic  0.25 mg weekly started today, in 4 weeks she can increase to 0.5 mg and titrate up to 1 mg in 4 weeks if well tolerated. We discussed some side effects. Regular exercise and healthy diet with avoidance of added sugar food intake is an important part of treatment and recommended. Annual eye exam, periodic dental and foot care recommended. F/U in 5 months.

## 2023-07-07 NOTE — Assessment & Plan Note (Signed)
 Last LDL 135 in 03/2023. Currently on rosuvastatin  10 mg daily, which she has tolerated well. Further recommendation will be given according to lipid panel result.

## 2023-07-07 NOTE — Patient Instructions (Addendum)
 A few things to remember from today's visit:  Type 2 diabetes mellitus with other specified complication, without long-term current use of insulin  (HCC) - Plan: POC HgB A1c, Microalbumin / creatinine urine ratio, Semaglutide ,0.25 or 0.5MG /DOS, (OZEMPIC , 0.25 OR 0.5 MG/DOSE,) 2 MG/3ML SOPN  Hyperlipidemia, unspecified hyperlipidemia type - Plan: Lipid panel  Hypokalemia - Plan: Basic metabolic panel, Magnesium   Ozempic  started today, start with 0.25 mg weekly for 4 weeks and increase to 0.5 mg if well tolerated. Let me know when you need refills, so we can try 1 mg weekly.  If you need refills for medications you take chronically, please call your pharmacy. Do not use My Chart to request refills or for acute issues that need immediate attention. If you send a my chart message, it may take a few days to be addressed, specially if I am not in the office.  Please be sure medication list is accurate. If a new problem present, please set up appointment sooner than planned today.

## 2023-07-07 NOTE — Progress Notes (Signed)
 HPI: Megan Wilkerson is a 58 y.o. female with a PMHx significant for asthma, HLD, DM II, OSA, GERD, pseudoseizures, peripheral neuropathy here today for chronic disease management.  Last seen on 04/05/2023  Diabetes Mellitus II: dx'ed 04/05/2023 - Checking BG at home: She has been checking her blood sugars. The highest reading has been 205, and the lowest was 95.  - Medications: Not currently on pharmacologic treatment.  - Diet: Met with nutritionist  06/19/23, states that she has not made many changes to her diet.  - Exercise: She has been walking 5x per week.  - eye exam: UTD on routine vision care. She goes twice per year.  - foot exam: Never, done today. - dental: UTD on routine dental care.  - She saw a nutritionist on 1/20.  - Negative for symptoms of hypoglycemia, polyuria, polydipsia, numbness, burning, or tingling of extremities, foot ulcers/trauma.  Hx of peripheral neuropathy (per records).  Lab Results  Component Value Date   HGBA1C 6.7 (H) 04/05/2023   Hyperlipidemia: Currently on rosuvastatin  10 mg daily.  Side effects from medication: none Lab Results  Component Value Date   CHOL 199 04/05/2023   HDL 38.60 (L) 04/05/2023   LDLCALC 135 (H) 04/05/2023   LDLDIRECT 121.0 07/23/2014   TRIG 124.0 04/05/2023   CHOLHDL 5 04/05/2023   HypoK+: She is still taking potassium supplementation, KLOR 20 meq daily. Lab Results  Component Value Date   NA 142 04/05/2023   CL 106 04/05/2023   K 4.1 05/03/2023   CO2 27 04/05/2023   BUN 8 04/05/2023   CREATININE 1.01 04/05/2023   GFR 62.29 04/05/2023   CALCIUM  9.9 04/05/2023   ALBUMIN 3.6 03/18/2023   GLUCOSE 137 (H) 04/05/2023   Review of Systems  Constitutional:  Negative for activity change, appetite change and fever.  HENT:  Negative for mouth sores, nosebleeds and sore throat.   Eyes:  Negative for redness and visual disturbance.  Respiratory:  Negative for cough, shortness of breath and wheezing.    Cardiovascular:  Negative for chest pain, palpitations and leg swelling.  Gastrointestinal:  Negative for abdominal pain, nausea and vomiting.  Genitourinary:  Negative for decreased urine volume, dysuria and hematuria.  Skin:  Negative for rash.  Neurological:  Negative for syncope, weakness and headaches.  See other pertinent positives and negatives in HPI.  Current Outpatient Medications on File Prior to Visit  Medication Sig Dispense Refill   Accu-Chek Softclix Lancets lancets Use to test blood sugar daily. 100 each 12   albuterol  (VENTOLIN  HFA) 108 (90 Base) MCG/ACT inhaler TAKE 2 PUFFS BY MOUTH EVERY 6 HOURS AS NEEDED FOR WHEEZE OR SHORTNESS OF BREATH 18 each 2   Blood Glucose Monitoring Suppl (ACCU-CHEK GUIDE) w/Device KIT Use to test blood sugar daily. 1 kit 0   brimonidine  (ALPHAGAN ) 0.15 % ophthalmic solution Place 1 drop into both eyes 2 (two) times daily.     EPINEPHrine  0.3 mg/0.3 mL IJ SOAJ injection Inject 0.3 mg into the muscle as needed for anaphylaxis. 2 each 0   glucose blood (ACCU-CHEK GUIDE TEST) test strip Use to test blood sugar daily. 100 each 12   hydrOXYzine  (ATARAX ) 25 MG tablet Take 1 tablet (25 mg total) by mouth 3 (three) times daily as needed for anxiety. 90 tablet 0   latanoprost  (XALATAN ) 0.005 % ophthalmic solution Place 1 drop into both eyes at bedtime.      pantoprazole  (PROTONIX ) 40 MG tablet Take 1 tablet (40 mg total) by mouth daily.  90 tablet 0   potassium chloride  SA (KLOR-CON  M) 20 MEQ tablet Take 1 tablet (20 mEq total) by mouth daily. 30 tablet 1   rosuvastatin  (CRESTOR ) 10 MG tablet TAKE 1 TABLET BY MOUTH EVERY DAY 90 tablet 1   budesonide -formoterol  (SYMBICORT ) 80-4.5 MCG/ACT inhaler Inhale 2 puffs into the lungs 2 (two) times daily. 10.2 g 3   loratadine  (CLARITIN ) 10 MG tablet Take 1 tablet (10 mg total) by mouth daily. 90 tablet 0   No current facility-administered medications on file prior to visit.    Past Medical History:  Diagnosis  Date   Allergy  2012   NUMEROUS ALLERGIES AND ANAPHYLACTIC REACTIONS SINCE 2012   Anaphylactic reaction    several ED visits for same since 01/2012   Anemia    IDA    Asthma    Blood transfusion without reported diagnosis 1988   AFTER  CHILDBIRTH   GERD    Glaucoma    Hives of unknown origin    chronic and recurrent urticaria since 01/2012   HYPERLIPIDEMIA, WITH LOW HDL    MIGRAINE HEADACHE    PERIPHERAL NEUROPATHY    POSITIVE PPD 1987   s/p 61mo tx   Seizures (HCC)    Sleep apnea 2018   CPAP   Allergies  Allergen Reactions   Milk-Related Compounds     Mouth itching, dizziness, coughing, wheezing and SOB   Other Anaphylaxis    Jolly rancher gummies and paprika    Peanuts [Peanut  Oil] Anaphylaxis and Itching   Corn Dextrin Other (See Comments)    Plain corn ok, popcorn causes hives   Gluten Meal Other (See Comments)    cough   Shellfish Allergy  Itching   Soy Allergy  (Obsolete) Other (See Comments)    coughing   Sunflower Oil Itching   Tomato Itching   Wheat     swelling   Morphine Itching and Rash    Social History   Socioeconomic History   Marital status: Married    Spouse name: Sumiye Hirth   Number of children: Not on file   Years of education: Not on file   Highest education level: Not on file  Occupational History   Not on file  Tobacco Use   Smoking status: Never   Smokeless tobacco: Never  Vaping Use   Vaping status: Never Used  Substance and Sexual Activity   Alcohol use: No   Drug use: No   Sexual activity: Not on file  Other Topics Concern   Not on file  Social History Narrative   Married, lives with spouse, son and nephew   Futures Trader, 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   Social Drivers of Corporate Investment Banker Strain: Not on file  Food Insecurity: No Food Insecurity (06/19/2023)   Hunger Vital Sign    Worried About Running Out of Food in the Last Year: Never true    Ran Out of  Food in the Last Year: Never true  Transportation Needs: Not on file  Physical Activity: Inactive (04/24/2018)   Exercise Vital Sign    Days of Exercise per Week: 0 days    Minutes of Exercise per Session: 0 min  Stress: Not on file  Social Connections: Not on file   Vitals:   07/07/23 1059  BP: 126/80  Pulse: 96  Resp: 16  SpO2: 95%   Wt Readings from Last 3 Encounters:  07/07/23 230 lb 8 oz (104.6  kg)  04/05/23 233 lb 4 oz (105.8 kg)  03/21/23 243 lb 2.7 oz (110.3 kg)   Body mass index is 37.2 kg/m.  Physical Exam Vitals and nursing note reviewed.  Constitutional:      General: She is not in acute distress.    Appearance: She is well-developed.  HENT:     Head: Normocephalic and atraumatic.     Mouth/Throat:     Mouth: Mucous membranes are moist.     Pharynx: Oropharynx is clear. Uvula midline.  Eyes:     Conjunctiva/sclera: Conjunctivae normal.  Cardiovascular:     Rate and Rhythm: Normal rate and regular rhythm.     Pulses:          Dorsalis pedis pulses are 2+ on the right side and 2+ on the left side.     Heart sounds: No murmur heard. Pulmonary:     Effort: Pulmonary effort is normal. No respiratory distress.     Breath sounds: Normal breath sounds.  Abdominal:     Palpations: Abdomen is soft. There is no mass.     Tenderness: There is no abdominal tenderness.  Musculoskeletal:     Right lower leg: No edema.     Left lower leg: No edema.  Skin:    General: Skin is warm.     Findings: No erythema or rash.  Neurological:     General: No focal deficit present.     Mental Status: She is alert and oriented to person, place, and time.     Cranial Nerves: No cranial nerve deficit.     Gait: Gait normal.  Psychiatric:        Mood and Affect: Mood and affect normal.   ASSESSMENT AND PLAN:  Ms. Eyman was seen today for chronic disease management.   Orders Placed This Encounter  Procedures   Tdap vaccine greater than or equal to 7yo IM   Pneumococcal  conjugate vaccine 20-valent (Prevnar 20)   Microalbumin / creatinine urine ratio   Basic metabolic panel   Lipid panel   Magnesium    POC HgB A1c   Lab Results  Component Value Date   HGBA1C 6.0 07/07/2023   Type 2 diabetes mellitus with other specified complication, without long-term current use of insulin  (HCC) Assessment & Plan: HgA1C went from 6.7 to 6.0. She is interested in pharmacologic treatment. Ozempic  0.25 mg weekly started today, in 4 weeks she can increase to 0.5 mg and titrate up to 1 mg in 4 weeks if well tolerated. We discussed some side effects. Regular exercise and healthy diet with avoidance of added sugar food intake is an important part of treatment and recommended. Annual eye exam, periodic dental and foot care recommended. F/U in 5 months.  Orders: -     POCT glycosylated hemoglobin (Hb A1C) -     Microalbumin / creatinine urine ratio; Future -     Ozempic  (0.25 or 0.5 MG/DOSE); 0.25 mg [place] weekly x 4 wks then 0.5 mg weekly  Dispense: 3 mL; Refill: 1  Obesity, morbid (HCC) Assessment & Plan: Has lost some wt since her last visit. She understands the benefits of wt loss as well as adverse effects of obesity. Consistency with healthy diet and physical activity encouraged. Ozempic  started today for DM II management.   Hyperlipidemia, unspecified hyperlipidemia type Assessment & Plan: Last LDL 135 in 03/2023. Currently on rosuvastatin  10 mg daily, which she has tolerated well. Further recommendation will be given according to lipid panel result.  Orders: -     Lipid panel; Future  Hypokalemia Assessment & Plan: On KLOR 20 meq daily. Last K+ 4.1 in 04/2023. Further recommendations according to BMP.  Orders: -     Basic metabolic panel; Future -     Magnesium ; Future  Need for pneumococcal vaccination -     Pneumococcal conjugate vaccine 20-valent  Need for Tdap vaccination -     Tdap vaccine greater than or equal to 7yo IM   Return in about 5  months (around 12/04/2023) for chronic problems.  I, Leonce PARAS Wierda, acting as a scribe for Gaelan Glennon, MD., have documented all relevant documentation on the behalf of Cariann Kinnamon, MD, as directed by  Bernadene Garside, MD while in the presence of Candia Kingsbury, MD.   I, Pau Banh, MD, have reviewed all documentation for this visit. The documentation on 07/07/23 for the exam, diagnosis, procedures, and orders are all accurate and complete.  Jeremaine Maraj G. Nicandro Perrault, MD  [facility]. [place] office.

## 2023-07-07 NOTE — Assessment & Plan Note (Signed)
 Has lost some wt since her last visit. She understands the benefits of wt loss as well as adverse effects of obesity. Consistency with healthy diet and physical activity encouraged. Ozempic  started today for DM II management.

## 2023-07-07 NOTE — Telephone Encounter (Signed)
 Pharmacy Patient Advocate Encounter  Received notification from [facility] MEDICAID that Prior Authorization for Ozempic  (0.25 or 0.5 MG/DOSE) 2MG /3ML  has been APPROVED from 07/07/2023 to 07/06/2024. Ran test claim, Copay is $4.00. This test claim was processed through [facility]- copay amounts may vary at other pharmacies due to pharmacy/plan contracts, or as the patient moves through the different stages of their insurance plan.   PA #/Case ID/Reference #: [id number]

## 2023-07-08 NOTE — Assessment & Plan Note (Signed)
 On KLOR 20 meq daily. Last K+ 4.1 in 04/2023. Further recommendations according to BMP.

## 2023-07-10 LAB — MICROALBUMIN / CREATININE URINE RATIO
Creatinine,U: 214.2 mg/dL
Microalb Creat Ratio: 8.3 mg/g (ref 0.0–30.0)
Microalb, Ur: 1.8 mg/dL (ref 0.0–1.9)

## 2023-07-11 ENCOUNTER — Other Ambulatory Visit (HOSPITAL_COMMUNITY): Payer: Self-pay

## 2023-09-10 ENCOUNTER — Other Ambulatory Visit: Payer: Self-pay | Admitting: Family Medicine

## 2023-09-10 DIAGNOSIS — E1169 Type 2 diabetes mellitus with other specified complication: Secondary | ICD-10-CM

## 2023-09-19 ENCOUNTER — Ambulatory Visit: Payer: Self-pay

## 2023-09-19 ENCOUNTER — Other Ambulatory Visit: Payer: Self-pay

## 2023-09-19 ENCOUNTER — Emergency Department (HOSPITAL_COMMUNITY)
Admission: EM | Admit: 2023-09-19 | Discharge: 2023-09-19 | Disposition: A | Discharge status: Home / Self Care | Attending: Emergency Medicine | Admitting: Emergency Medicine

## 2023-09-19 DIAGNOSIS — E162 Hypoglycemia, unspecified: Secondary | ICD-10-CM

## 2023-09-19 DIAGNOSIS — Z7951 Long term (current) use of inhaled steroids: Secondary | ICD-10-CM | POA: Diagnosis not present

## 2023-09-19 DIAGNOSIS — J45909 Unspecified asthma, uncomplicated: Secondary | ICD-10-CM | POA: Diagnosis not present

## 2023-09-19 DIAGNOSIS — N3001 Acute cystitis with hematuria: Secondary | ICD-10-CM | POA: Insufficient documentation

## 2023-09-19 DIAGNOSIS — R9431 Abnormal electrocardiogram [ECG] [EKG]: Secondary | ICD-10-CM | POA: Diagnosis not present

## 2023-09-19 DIAGNOSIS — E11649 Type 2 diabetes mellitus with hypoglycemia without coma: Secondary | ICD-10-CM | POA: Insufficient documentation

## 2023-09-19 DIAGNOSIS — Z7984 Long term (current) use of oral hypoglycemic drugs: Secondary | ICD-10-CM | POA: Insufficient documentation

## 2023-09-19 LAB — URINALYSIS, ROUTINE W REFLEX MICROSCOPIC
Bilirubin Urine: NEGATIVE
Glucose, UA: NEGATIVE mg/dL
Ketones, ur: NEGATIVE mg/dL
Nitrite: NEGATIVE
Protein, ur: NEGATIVE mg/dL
Specific Gravity, Urine: 1.019 (ref 1.005–1.030)
pH: 5 (ref 5.0–8.0)

## 2023-09-19 LAB — BASIC METABOLIC PANEL WITH GFR
Anion gap: 10 (ref 5–15)
BUN: 9 mg/dL (ref 6–20)
CO2: 24 mmol/L (ref 22–32)
Calcium: 9.4 mg/dL (ref 8.9–10.3)
Chloride: 107 mmol/L (ref 98–111)
Creatinine, Ser: 0.75 mg/dL (ref 0.44–1.00)
GFR, Estimated: >60 mL/min (ref >60)
Glucose, Bld: 97 mg/dL (ref 70–99)
Potassium: 3.6 mmol/L (ref 3.5–5.1)
Sodium: 141 mmol/L (ref 135–145)

## 2023-09-19 LAB — CBC
HCT: 43 % (ref 36.0–46.0)
Hemoglobin: 14.4 g/dL (ref 12.0–15.0)
MCH: 30.6 pg (ref 26.0–34.0)
MCHC: 33.5 g/dL (ref 30.0–36.0)
MCV: 91.3 fL (ref 80.0–100.0)
Platelets: 195 10*3/uL (ref 150–400)
RBC: 4.71 MIL/uL (ref 3.87–5.11)
RDW: 12.1 % (ref 11.5–15.5)
WBC: 4.9 10*3/uL (ref 4.0–10.5)
nRBC: 0 % (ref 0.0–0.2)

## 2023-09-19 LAB — CBG MONITORING, ED
Glucose-Capillary: 87 mg/dL (ref 70–99)
Glucose-Capillary: 73 mg/dL (ref 70–99)

## 2023-09-19 MED ORDER — CEPHALEXIN 500 MG PO CAPS: 500.0000 mg | ORAL_CAPSULE | Freq: Three times a day (TID) | ORAL | 0 refills | Status: AC

## 2023-09-19 NOTE — ED Triage Notes (Signed)
 Pt arrived reporting hypoglycemic. States check BG this morning because she felt dizzy and was shaking. BG was 70. In triage cbg 87. Patient endorses nausea and dizziness at this time. States this morning has one episode of blurred vision which has gone back to normal now. No other concerns

## 2023-09-19 NOTE — ED Provider Notes (Addendum)
 [place] EMERGENCY DEPARTMENT AT [facility] Provider Note   CSN: [id number] Arrival date & time: 09/19/23  4098     History  Chief Complaint  Patient presents with   Hypoglycemia    Megan Wilkerson is a 58 y.o. female.  58 year old female with past medical history significant for diabetes on Ozempic  presents today for concern of low blood sugar.  She states her blood sugar this morning was 70 which she checked after feeling lightheaded and just overall not well.  She states she took her most recent dose of Ozempic  today.  No recent dose change.  Denies any urinary symptoms, or upper respiratory infectious symptoms.  The history is provided by the patient. No language interpreter was used.       Home Medications Prior to Admission medications   Medication Sig Start Date End Date Taking? Authorizing Provider  Accu-Chek Softclix Lancets lancets Use to test blood sugar daily. 05/08/23   [place], Betty G, MD  albuterol  (VENTOLIN  HFA) [phone] Base) MCG/ACT inhaler TAKE 2 PUFFS BY MOUTH EVERY 6 HOURS AS NEEDED FOR WHEEZE OR SHORTNESS OF BREATH 05/01/23   [place], Betty G, MD  Blood Glucose Monitoring Suppl (ACCU-CHEK GUIDE) w/Device KIT Use to test blood sugar daily. 05/08/23   [place], Betty G, MD  brimonidine  (ALPHAGAN ) 0.15 % ophthalmic solution Place 1 drop into both eyes 2 (two) times daily.    [provider]  budesonide -formoterol  (SYMBICORT ) 80-4.5 MCG/ACT inhaler Inhale 2 puffs into the lungs 2 (two) times daily. 03/21/23 06/19/23  [place], Eric J, DO  EPINEPHrine  0.3 mg/0.3 mL IJ SOAJ injection Inject 0.3 mg into the muscle as needed for anaphylaxis. 03/21/23   [place], Eric J, DO  glucose blood (ACCU-CHEK GUIDE TEST) test strip Use to test blood sugar daily. 05/08/23   [place], Betty G, MD  hydrOXYzine  (ATARAX ) 25 MG tablet Take 1 tablet (25 mg total) by mouth 3 (three) times daily as needed for anxiety. 03/21/23   [place], Rema Care, DO  latanoprost  (XALATAN ) 0.005  % ophthalmic solution Place 1 drop into both eyes at bedtime.  10/31/17   [provider]  loratadine  (CLARITIN ) 10 MG tablet Take 1 tablet (10 mg total) by mouth daily. 03/21/23 06/19/23  [place], Eric J, DO  pantoprazole  (PROTONIX ) 40 MG tablet Take 1 tablet (40 mg total) by mouth daily. 03/21/23   [place], Rema Care, DO  potassium chloride  SA (KLOR-CON  M) 20 MEQ tablet Take 1 tablet (20 mEq total) by mouth daily. 04/06/23   [place], Betty G, MD  rosuvastatin  (CRESTOR ) 10 MG tablet TAKE 1 TABLET BY MOUTH EVERY DAY 06/02/23   [place], Betty G, MD  Semaglutide ,0.25 or 0.5MG /DOS, (OZEMPIC , 0.25 OR 0.5 MG/DOSE,) 2 MG/3ML SOPN INJECT 0.25 MG SUBCUTANEOUSLY WEEKLY X 4 WKS THEN 0.5 MG WEEKLY 09/11/23   [place], Betty G, MD      Allergies    Milk-related compounds, Other, Peanuts [peanut  oil], Corn dextrin, Gluten meal, Shellfish allergy , Soy allergy  (obsolete), Sunflower oil, Tomato, Wheat, and Morphine    Review of Systems   Review of Systems  Constitutional:  Negative for fever.  Eyes:  Negative for visual disturbance.  Respiratory:  Negative for cough and shortness of breath.   Gastrointestinal:  Negative for abdominal pain.  Genitourinary:  Negative for dysuria and frequency.  Neurological:  Positive for light-headedness. Negative for headaches.    Physical Exam Updated Vital Signs BP (!) 151/112 (BP Location: Left Arm)   Pulse (!) 111   Temp 98.4 F (  36.9 C) (Oral)   Resp 16   Ht 5\' 6"  (1.676 m)   Wt 104.6 kg   SpO2 100%   BMI 37.22 kg/m  Physical Exam Vitals and nursing note reviewed.  Constitutional:      General: She is not in acute distress.    Appearance: Normal appearance. She is not ill-appearing.  HENT:     Head: Normocephalic and atraumatic.     Nose: Nose normal.  Eyes:     Conjunctiva/sclera: Conjunctivae normal.  Cardiovascular:     Rate and Rhythm: Normal rate.     Comments: Initially noted to be tachycardic.  Normal rate during my evaluation. Pulmonary:      Effort: Pulmonary effort is normal. No respiratory distress.  Abdominal:     Tenderness: There is no right CVA tenderness or left CVA tenderness.  Musculoskeletal:        General: No deformity.  Skin:    Findings: No rash.  Neurological:     Mental Status: She is alert.     ED Results / Procedures / Treatments   Labs (all labs ordered are listed, but only abnormal results are displayed) Labs Reviewed  URINALYSIS, ROUTINE W REFLEX MICROSCOPIC - Abnormal; Notable for the following components:      Result Value   APPearance CLOUDY (*)    Hgb urine dipstick MODERATE (*)    Leukocytes,Ua LARGE (*)    Bacteria, UA RARE (*)    All other components within normal limits  BASIC METABOLIC PANEL WITH GFR  CBC  CBG MONITORING, ED  CBG MONITORING, ED  CBG MONITORING, ED    EKG None  Radiology No results found.  Procedures Procedures    Medications Ordered in ED Medications - No data to display  ED Course/ Medical Decision Making/ A&P                                 Medical Decision Making Amount and/or Complexity of Data Reviewed Labs: ordered.  Risk Prescription drug management.   Medical Decision Making / ED Course   This patient presents to the ED for concern of hypoglycemia, this involves an extensive number of treatment options, and is a complaint that carries with it a high risk of complications and morbidity.  The differential diagnosis includes infection, overmedication, decreased p.o. intake  MDM: 58 year old female presents today for concern of low blood sugar.  She states typically her blood sugars range from 120-150 and this morning she was not feeling well and when she checked it was 70.  She drank some juice but did not feel better so she came in for evaluation.  She denies any urinary tract infection symptoms, denies upper respiratory infection symptoms.  CBC, BMP unremarkable. Repeat CBG at 06/20/1971. UA with evidence of UTI. Will start on  Keflex . Discussed close monitoring of blood sugars and p.o. intake when it gets around 70.  Discussed if it drops further and she is unable to get this to improve with p.o. intake to return to the emergency department. Strict return cautions discussed Discharged in stable condition.  Patient and sister who was at bedside voiced understanding and are in agreement with the plan.   Additional history obtained: -Additional history obtained from sister at bedside -External records from outside source obtained and reviewed including: Chart review including previous notes, labs, imaging, consultation notes   Lab Tests: -I ordered, reviewed, and interpreted labs.   The  pertinent results include:   Labs Reviewed  URINALYSIS, ROUTINE W REFLEX MICROSCOPIC - Abnormal; Notable for the following components:      Result Value   APPearance CLOUDY (*)    Hgb urine dipstick MODERATE (*)    Leukocytes,Ua LARGE (*)    Bacteria, UA RARE (*)    All other components within normal limits  BASIC METABOLIC PANEL WITH GFR  CBC  CBG MONITORING, ED  CBG MONITORING, ED  CBG MONITORING, ED      EKG  EKG Interpretation Date/Time:    Ventricular Rate:    PR Interval:    QRS Duration:    QT Interval:    QTC Calculation:   R Axis:      Text Interpretation:          Medicines ordered and prescription drug management: No orders of the defined types were placed in this encounter.   -I have reviewed the patients home medicines and have made adjustments as needed  Social Determinants of Health:  Factors impacting patients care include: good family support   Reevaluation: After the interventions noted above, I reevaluated the patient and found that they have :improved  Co morbidities that complicate the patient evaluation  Past Medical History:  Diagnosis Date   Allergy  2012   NUMEROUS ALLERGIES AND ANAPHYLACTIC REACTIONS SINCE 2012   Anaphylactic reaction    several ED visits for same since  01/2012   Anemia    IDA    Asthma    Blood transfusion without reported diagnosis 1988   AFTER  CHILDBIRTH   GERD    Glaucoma    Hives of unknown origin    chronic and recurrent urticaria since 01/2012   HYPERLIPIDEMIA, WITH LOW HDL    MIGRAINE HEADACHE    PERIPHERAL NEUROPATHY    POSITIVE PPD 1987   s/p 67mo tx   Seizures (HCC)    Sleep apnea 2018   CPAP      Dispostion: Discharged in stable condition.  Return precaution discussed.  Patient voices understanding and is in agreement with the plan.   Final Clinical Impression(s) / ED Diagnoses Final diagnoses:  Hypoglycemia  Acute cystitis with hematuria    Rx / DC Orders ED Discharge Orders          Ordered    cephALEXin  (KEFLEX ) 500 MG capsule  3 times daily        09/19/23 1347              Lucina Sabal, PA-C 09/19/23 1345    Lucina Sabal, PA-C 09/19/23 1349    [address] PA-C 09/19/23 1349    Jerilynn Montenegro, MD 09/19/23 1610

## 2023-09-19 NOTE — Telephone Encounter (Addendum)
 Copied from CRM [id number]. Topic: Clinical - Red Word Triage >> Sep 19, 2023  8:06 AM Dimple Francis wrote: Red Word that prompted transfer to Nurse Triage: sugar went low 4/21- still not feeling well, headache and blurred vision- shaky- weak. today it is 157   Chief Complaint: blood sugar problems Symptoms: Not feeling well, headache, blurred vision in one eye, feel really weak, trouble walking, shaky, heart racing, heart palpitations, "foamy" urine, nausea Frequency: continual Pertinent Negatives: Patient denies dark urine, vomiting, SOB, chest pain, too weak to stand Disposition: [] 911 / [x] ED /[] Urgent Care (no appt availability in office) / [] Appointment(In office/virtual)/ []  [facility]/ [] Home Care/ [] Refused Recommended Disposition /[] [place] [facility]/ []  Follow-up with PCP Additional Notes: Pt reporting that yesterday her blood sugar was 70, was weak all day yesterday, trouble walking with weakness, been having blurred vision in one eye, today feeling shaky, nauseous, feeling heart racing and heart palpitations. Pt confirms her blood sugar this morning is 157, not feeling too weak to stand, no SOB or chest pain, no vomiting. Advised pt go to ED right away and have another adult drive her, advised call [phone] for any worsening. Pt verbalized understanding, stated they will head to ED shortly.  Reason for Disposition  Patient sounds very sick or weak to the triager  Answer Assessment - Initial Assessment Questions 1. BLOOD GLUCOSE: "What is your blood glucose level?"      157 2. ONSET: "When did you check the blood glucose?"     Just this morning 8. OTHER SYMPTOMS: "Do you have any symptoms?" (e.g., fever, frequent urination, difficulty breathing, dizziness, weakness, vomiting)     Not feeling well, headache, blurred vision in one eye, feel really weak, trouble walking, shaky, heart racing, heart palpitations  Protocols used: Diabetes - High Blood Sugar-A-AH

## 2023-09-19 NOTE — Telephone Encounter (Signed)
Pt is at the ED. °

## 2023-09-19 NOTE — Discharge Instructions (Signed)
 Your blood sugar remained stable in the emergency department.  If you notice this is dropping please eat and drink something to get this to improve.  If it continues to drop and you are unable to get this to improve with p.o. intake return to the emergency department specially if you are not feeling well along with a low blood sugar. You do have a urinary tract infection which could contribute to a low blood sugar.  I have sent antibiotic into the pharmacy.  Return for any concerning symptoms.

## 2023-11-09 ENCOUNTER — Observation Stay (HOSPITAL_COMMUNITY)
Admission: EM | Admit: 2023-11-09 | Discharge: 2023-11-10 | Disposition: A | Discharge status: Home / Self Care | Attending: Internal Medicine | Admitting: Internal Medicine

## 2023-11-09 ENCOUNTER — Encounter (HOSPITAL_COMMUNITY): Payer: Self-pay | Admitting: Student

## 2023-11-09 ENCOUNTER — Other Ambulatory Visit: Payer: Self-pay

## 2023-11-09 DIAGNOSIS — R569 Unspecified convulsions: Secondary | ICD-10-CM | POA: Diagnosis not present

## 2023-11-09 DIAGNOSIS — T7840XA Allergy, unspecified, initial encounter: Secondary | ICD-10-CM | POA: Diagnosis not present

## 2023-11-09 DIAGNOSIS — E119 Type 2 diabetes mellitus without complications: Secondary | ICD-10-CM | POA: Insufficient documentation

## 2023-11-09 DIAGNOSIS — E876 Hypokalemia: Secondary | ICD-10-CM | POA: Insufficient documentation

## 2023-11-09 DIAGNOSIS — E785 Hyperlipidemia, unspecified: Secondary | ICD-10-CM | POA: Insufficient documentation

## 2023-11-09 DIAGNOSIS — E559 Vitamin D deficiency, unspecified: Secondary | ICD-10-CM | POA: Insufficient documentation

## 2023-11-09 DIAGNOSIS — Z8659 Personal history of other mental and behavioral disorders: Secondary | ICD-10-CM | POA: Insufficient documentation

## 2023-11-09 DIAGNOSIS — T7840XD Allergy, unspecified, subsequent encounter: Secondary | ICD-10-CM

## 2023-11-09 DIAGNOSIS — E1169 Type 2 diabetes mellitus with other specified complication: Secondary | ICD-10-CM

## 2023-11-09 DIAGNOSIS — T782XXA Anaphylactic shock, unspecified, initial encounter: Principal | ICD-10-CM

## 2023-11-09 DIAGNOSIS — K219 Gastro-esophageal reflux disease without esophagitis: Secondary | ICD-10-CM | POA: Diagnosis not present

## 2023-11-09 DIAGNOSIS — J45909 Unspecified asthma, uncomplicated: Secondary | ICD-10-CM | POA: Insufficient documentation

## 2023-11-09 LAB — CBC WITH DIFFERENTIAL/PLATELET
Abs Immature Granulocytes: 0.01 10*3/uL (ref 0.00–0.07)
Basophils Absolute: 0.1 10*3/uL (ref 0.0–0.1)
Basophils Relative: 1 %
Eosinophils Absolute: 0.2 10*3/uL (ref 0.0–0.5)
Eosinophils Relative: 3 %
HCT: 41.5 % (ref 36.0–46.0)
Hemoglobin: 13.7 g/dL (ref 12.0–15.0)
Immature Granulocytes: 0 %
Lymphocytes Relative: 59 %
Lymphs Abs: 4.1 10*3/uL — ABNORMAL HIGH (ref 0.7–4.0)
MCH: 30.2 pg (ref 26.0–34.0)
MCHC: 33 g/dL (ref 30.0–36.0)
MCV: 91.4 fL (ref 80.0–100.0)
Monocytes Absolute: 0.5 10*3/uL (ref 0.1–1.0)
Monocytes Relative: 7 %
Neutro Abs: 2.1 10*3/uL (ref 1.7–7.7)
Neutrophils Relative %: 30 %
Platelets: 245 10*3/uL (ref 150–400)
RBC: 4.54 MIL/uL (ref 3.87–5.11)
RDW: 12.3 % (ref 11.5–15.5)
WBC: 7 10*3/uL (ref 4.0–10.5)
nRBC: 0 % (ref 0.0–0.2)

## 2023-11-09 LAB — COMPREHENSIVE METABOLIC PANEL WITH GFR
ALT: 14 U/L (ref 0–44)
AST: 19 U/L (ref 15–41)
Albumin: 4.2 g/dL (ref 3.5–5.0)
Alkaline Phosphatase: 76 U/L (ref 38–126)
Anion gap: 14 (ref 5–15)
BUN: 10 mg/dL (ref 6–20)
CO2: 21 mmol/L — ABNORMAL LOW (ref 22–32)
Calcium: 9.3 mg/dL (ref 8.9–10.3)
Chloride: 104 mmol/L (ref 98–111)
Creatinine, Ser: 0.81 mg/dL (ref 0.44–1.00)
GFR, Estimated: >60 mL/min (ref >60)
Glucose, Bld: 188 mg/dL — ABNORMAL HIGH (ref 70–99)
Potassium: 3.2 mmol/L — ABNORMAL LOW (ref 3.5–5.1)
Sodium: 139 mmol/L (ref 135–145)
Total Bilirubin: 0.7 mg/dL (ref 0.0–1.2)
Total Protein: 7.4 g/dL (ref 6.5–8.1)

## 2023-11-09 LAB — MAGNESIUM: Magnesium: 2 mg/dL (ref 1.7–2.4)

## 2023-11-09 LAB — PHOSPHORUS: Phosphorus: 3.4 mg/dL (ref 2.5–4.6)

## 2023-11-09 LAB — GLUCOSE, CAPILLARY: Glucose-Capillary: 215 mg/dL — ABNORMAL HIGH (ref 70–99)

## 2023-11-09 LAB — CK: Total CK: 41 U/L (ref 38–234)

## 2023-11-09 MED ORDER — ONDANSETRON HCL 4 MG/2ML IJ SOLN: 4.0000 mg | Freq: Four times a day (QID) | INTRAMUSCULAR | Status: DC | PRN

## 2023-11-09 MED ORDER — ALBUTEROL SULFATE (2.5 MG/3ML) 0.083% IN NEBU: 3.0000 mL | INHALATION_SOLUTION | Freq: Four times a day (QID) | RESPIRATORY_TRACT | Status: DC | PRN

## 2023-11-09 MED ORDER — ONDANSETRON HCL 4 MG PO TABS: 4.0000 mg | ORAL_TABLET | Freq: Four times a day (QID) | ORAL | Status: DC | PRN

## 2023-11-09 MED ORDER — LORAZEPAM 2 MG/ML IJ SOLN: 2.0000 mg | Freq: Once | INTRAMUSCULAR | Status: AC

## 2023-11-09 MED ORDER — POTASSIUM CHLORIDE CRYS ER 20 MEQ PO TBCR
40.0000 meq | EXTENDED_RELEASE_TABLET | Freq: Once | ORAL | Status: AC
  Administered 2023-11-09: 40 meq via ORAL
  Filled 2023-11-09: qty 2

## 2023-11-09 MED ORDER — ACETAMINOPHEN 325 MG PO TABS
650.0000 mg | ORAL_TABLET | Freq: Four times a day (QID) | ORAL | Status: DC | PRN
  Administered 2023-11-10: 650 mg via ORAL
  Filled 2023-11-09: qty 2

## 2023-11-09 MED ORDER — ENOXAPARIN SODIUM 40 MG/0.4ML IJ SOSY
40.0000 mg | PREFILLED_SYRINGE | INTRAMUSCULAR | Status: DC
  Administered 2023-11-09: 40 mg via SUBCUTANEOUS
  Filled 2023-11-09: qty 0.4

## 2023-11-09 MED ORDER — SODIUM CHLORIDE 0.9 % IV SOLN: INTRAVENOUS | Status: AC

## 2023-11-09 MED ORDER — METHYLPREDNISOLONE SODIUM SUCC 125 MG IJ SOLR
125.0000 mg | Freq: Once | INTRAMUSCULAR | Status: AC
  Administered 2023-11-09: 125 mg via INTRAVENOUS
  Filled 2023-11-09: qty 2

## 2023-11-09 MED ORDER — INSULIN ASPART 100 UNIT/ML IJ SOLN
0.0000 [IU] | Freq: Three times a day (TID) | INTRAMUSCULAR | Status: DC
  Administered 2023-11-10: 2 [IU] via SUBCUTANEOUS
  Filled 2023-11-09: qty 0.15

## 2023-11-09 MED ORDER — DIPHENHYDRAMINE HCL 25 MG PO TABS: 25.0000 mg | ORAL_TABLET | Freq: Four times a day (QID) | ORAL | 0 refills | Status: DC | PRN

## 2023-11-09 MED ORDER — FAMOTIDINE IN NACL 20-0.9 MG/50ML-% IV SOLN
20.0000 mg | Freq: Once | INTRAVENOUS | Status: AC
  Administered 2023-11-09: 20 mg via INTRAVENOUS
  Filled 2023-11-09: qty 50

## 2023-11-09 MED ORDER — LATANOPROST 0.005 % OP SOLN
1.0000 [drp] | Freq: Every day | OPHTHALMIC | Status: DC
  Administered 2023-11-09: 1 [drp] via OPHTHALMIC
  Filled 2023-11-09: qty 2.5

## 2023-11-09 MED ORDER — DIPHENHYDRAMINE HCL 50 MG/ML IJ SOLN
25.0000 mg | Freq: Once | INTRAMUSCULAR | Status: AC
  Administered 2023-11-09: 25 mg via INTRAVENOUS
  Filled 2023-11-09: qty 1

## 2023-11-09 MED ORDER — POTASSIUM CHLORIDE CRYS ER 20 MEQ PO TBCR
20.0000 meq | EXTENDED_RELEASE_TABLET | Freq: Every day | ORAL | Status: DC
  Administered 2023-11-10: 20 meq via ORAL
  Filled 2023-11-09: qty 1

## 2023-11-09 MED ORDER — EPINEPHRINE 0.3 MG/0.3ML IJ SOAJ
0.3000 mg | Freq: Once | INTRAMUSCULAR | Status: AC
  Administered 2023-11-09: 0.3 mg via INTRAMUSCULAR
  Filled 2023-11-09: qty 0.3

## 2023-11-09 MED ORDER — EPINEPHRINE 0.3 MG/0.3ML IJ SOAJ: 0.3000 mg | INTRAMUSCULAR | Status: DC | PRN

## 2023-11-09 MED ORDER — INSULIN ASPART 100 UNIT/ML IJ SOLN
0.0000 [IU] | Freq: Every day | INTRAMUSCULAR | Status: DC
  Administered 2023-11-09: 2 [IU] via SUBCUTANEOUS
  Filled 2023-11-09: qty 0.05

## 2023-11-09 MED ORDER — LORATADINE 10 MG PO TABS
10.0000 mg | ORAL_TABLET | Freq: Every day | ORAL | Status: DC
  Administered 2023-11-10: 10 mg via ORAL
  Filled 2023-11-09: qty 1

## 2023-11-09 MED ORDER — EPINEPHRINE 0.3 MG/0.3ML IJ SOAJ: 0.3000 mg | INTRAMUSCULAR | 0 refills | Status: DC | PRN

## 2023-11-09 MED ORDER — FLUTICASONE FUROATE-VILANTEROL 100-25 MCG/ACT IN AEPB: 1.0000 | INHALATION_SPRAY | Freq: Every day | RESPIRATORY_TRACT | Status: DC

## 2023-11-09 MED ORDER — LORAZEPAM 2 MG/ML IJ SOLN
INTRAMUSCULAR | Status: AC
  Administered 2023-11-09: 2 mg via INTRAVENOUS
  Filled 2023-11-09: qty 1

## 2023-11-09 MED ORDER — ACETAMINOPHEN 650 MG RE SUPP: 650.0000 mg | Freq: Four times a day (QID) | RECTAL | Status: DC | PRN

## 2023-11-09 MED ORDER — PANTOPRAZOLE SODIUM 40 MG PO TBEC
40.0000 mg | DELAYED_RELEASE_TABLET | Freq: Every day | ORAL | Status: DC
  Administered 2023-11-10: 40 mg via ORAL
  Filled 2023-11-09: qty 1

## 2023-11-09 MED ORDER — HYDROXYZINE HCL 25 MG PO TABS: 25.0000 mg | ORAL_TABLET | Freq: Three times a day (TID) | ORAL | Status: DC | PRN

## 2023-11-09 MED ORDER — ROSUVASTATIN CALCIUM 20 MG PO TABS
10.0000 mg | ORAL_TABLET | Freq: Every day | ORAL | Status: DC
  Administered 2023-11-10: 10 mg via ORAL
  Filled 2023-11-09: qty 1
  Filled 2023-11-09: qty 0.5

## 2023-11-09 MED ORDER — BRIMONIDINE TARTRATE 0.15 % OP SOLN
1.0000 [drp] | Freq: Two times a day (BID) | OPHTHALMIC | Status: DC
  Administered 2023-11-10: 1 [drp] via OPHTHALMIC
  Filled 2023-11-09: qty 5

## 2023-11-09 MED ORDER — EPINEPHRINE 0.3 MG/0.3ML IJ SOAJ
INTRAMUSCULAR | Status: AC
  Filled 2023-11-09: qty 0.3

## 2023-11-09 NOTE — ED Notes (Signed)
 Patient coming around and responding to ED staff. No longer having seizure activity. JRPRN

## 2023-11-09 NOTE — Discharge Instructions (Addendum)
 You were seen in the emergency department for your allergic reaction.  We gave you additional steroids and Pepcid  in the ER and you had no worsening of symptoms.  I have given you a refill of your EpiPen  and you should carry this on you at all times for any recurrent reactions.  You can follow-up with your primary doctor to have your symptoms rechecked.  You should return to the emergency department if you need to give yourself your EpiPen , you have swelling to your lips, tongue or throat, shortness of breath or any other new or concerning symptoms.

## 2023-11-09 NOTE — ED Provider Notes (Signed)
 [place] EMERGENCY DEPARTMENT AT [facility] Provider Note   CSN: [id number] Arrival date & time: 11/09/23  1336     Patient presents with: Allergic Reaction   Megan Wilkerson is a 58 y.o. female.   Patient is a 58 year old female with a past medical history of anaphylaxis and diabetes presenting to the emergency department for anaphylaxis.  She states that she was at a restaurant and smelled some fish which she has a known allergy  to.  She states that she started coughing and feeling like her throat was swollen.  She states that her son gave her her EpiPen  as well as Benadryl  and called 911.  She states that EMS gave her an additional dose of EpiPen .  She states that since then her symptoms have improved.  She states that she is no longer coughing or feeling like her throat was swelling.  She states that she never had any rash or itching.  She states that she did have some nausea but has not vomited, denies any diarrhea or abdominal pain.  The history is provided by the patient and the EMS personnel.  Allergic Reaction      Prior to Admission medications   Medication Sig Start Date End Date Taking? Authorizing Provider  Accu-Chek Softclix Lancets lancets Use to test blood sugar daily. 05/08/23   [place], Betty G, MD  albuterol  (VENTOLIN  HFA) [phone] Base) MCG/ACT inhaler TAKE 2 PUFFS BY MOUTH EVERY 6 HOURS AS NEEDED FOR WHEEZE OR SHORTNESS OF BREATH 05/01/23   [place], Betty G, MD  Blood Glucose Monitoring Suppl (ACCU-CHEK GUIDE) w/Device KIT Use to test blood sugar daily. 05/08/23   [place], Betty G, MD  brimonidine  (ALPHAGAN ) 0.15 % ophthalmic solution Place 1 drop into both eyes 2 (two) times daily.    [provider]  budesonide -formoterol  (SYMBICORT ) 80-4.5 MCG/ACT inhaler Inhale 2 puffs into the lungs 2 (two) times daily. 03/21/23 11/09/23  [place], Rema Care, DO  EPINEPHrine  0.3 mg/0.3 mL IJ SOAJ injection Inject 0.3 mg into the muscle as needed for anaphylaxis.  11/09/23   Kingsley, Candance Bohlman K, DO  glucose blood (ACCU-CHEK GUIDE TEST) test strip Use to test blood sugar daily. 05/08/23   [place], Betty G, MD  hydrOXYzine  (ATARAX ) 25 MG tablet Take 1 tablet (25 mg total) by mouth 3 (three) times daily as needed for anxiety. 03/21/23   [place], Rema Care, DO  latanoprost  (XALATAN ) 0.005 % ophthalmic solution Place 1 drop into both eyes at bedtime.  10/31/17   [provider]  loratadine  (CLARITIN ) 10 MG tablet Take 1 tablet (10 mg total) by mouth daily. 03/21/23 11/09/23  [place], Rema Care, DO  pantoprazole  (PROTONIX ) 40 MG tablet Take 1 tablet (40 mg total) by mouth daily. 03/21/23   [place], Rema Care, DO  potassium chloride  SA (KLOR-CON  M) 20 MEQ tablet Take 1 tablet (20 mEq total) by mouth daily. 04/06/23   [place], Betty G, MD  rosuvastatin  (CRESTOR ) 10 MG tablet TAKE 1 TABLET BY MOUTH EVERY DAY 06/02/23   [place], Betty G, MD  Semaglutide ,0.25 or 0.5MG /DOS, (OZEMPIC , 0.25 OR 0.5 MG/DOSE,) 2 MG/3ML SOPN INJECT 0.25 MG SUBCUTANEOUSLY WEEKLY X 4 WKS THEN 0.5 MG WEEKLY 09/11/23   [place], Betty G, MD    Allergies: Milk-related compounds, Other, Peanuts [peanut  oil], Shellfish allergy , Corn dextrin, Gluten meal, Soy allergy  (obsolete), Sunflower oil, Tomato, Wheat, and Morphine    Review of Systems  Updated Vital Signs BP 123/81   Pulse 91   Temp 98.2 F (36.8 C) (  Oral)   Resp (!) 23   SpO2 97%   Physical Exam Vitals and nursing note reviewed.  Constitutional:      General: She is not in acute distress.    Appearance: Normal appearance. She is obese.  HENT:     Head: Normocephalic and atraumatic.     Nose: Nose normal.     Mouth/Throat:     Mouth: Mucous membranes are moist.     Pharynx: Oropharynx is clear.     Comments: No swelling to lips, tongue or posterior oropharynx, mildly hoarse voice  Eyes:     Extraocular Movements: Extraocular movements intact.     Conjunctiva/sclera: Conjunctivae normal.    Cardiovascular:     Rate and Rhythm: Normal  rate and regular rhythm.     Heart sounds: Normal heart sounds.  Pulmonary:     Effort: Pulmonary effort is normal.     Breath sounds: Normal breath sounds. No stridor. No wheezing.  Abdominal:     General: Abdomen is flat.     Palpations: Abdomen is soft.     Tenderness: There is no abdominal tenderness.   Musculoskeletal:        General: Normal range of motion.     Cervical back: Normal range of motion.   Skin:    General: Skin is warm and dry.     Findings: No rash.   Neurological:     General: No focal deficit present.     Mental Status: She is alert and oriented to person, place, and time.   Psychiatric:        Mood and Affect: Mood normal.        Behavior: Behavior normal.     (all labs ordered are listed, but only abnormal results are displayed) Labs Reviewed  COMPREHENSIVE METABOLIC PANEL WITH GFR - Abnormal; Notable for the following components:      Result Value   Potassium 3.2 (*)    CO2 21 (*)    Glucose, Bld 188 (*)    All other components within normal limits  CBC WITH DIFFERENTIAL/PLATELET - Abnormal; Notable for the following components:   Lymphs Abs 4.1 (*)    All other components within normal limits    EKG: None  Radiology: No results found.   Procedures   Medications Ordered in the ED  methylPREDNISolone  sodium succinate (SOLU-MEDROL ) 125 mg/2 mL injection 125 mg (125 mg Intravenous Given 11/09/23 1358)  famotidine  (PEPCID ) IVPB 20 mg premix (0 mg Intravenous Stopped 11/09/23 1448)  potassium chloride  SA (KLOR-CON  M) CR tablet 40 mEq (40 mEq Oral Given 11/09/23 1554)    Clinical Course as of 11/09/23 1704  Thu Nov 09, 2023  1506 Mild hypokalemia, will be repleted. [VK]  1549 Upon reassessment, patient continuing to feel well without any return of symptoms. [VK]  1703 Patient signed out to Dr. Zammit in stable condition pending reassessment with plan for likely discharge if no recurrence of symptoms. [VK]    Clinical Course User  Index [VK] Kingsley, Oanh Devivo K, DO                                 Medical Decision Making This patient presents to the ED with chief complaint(s) of allergic reaction with pertinent past medical history of anaphylaxis, diabetes which further complicates the presenting complaint. The complaint involves an extensive differential diagnosis and also carries with it a high risk of complications and morbidity.  The differential diagnosis includes anaphylaxis, allergic reaction, no signs of angioedema on exam  Additional history obtained: Additional history obtained from EMS  Records reviewed Primary Care Documents  ED Course and Reassessment: On patient's arrival she is hemodynamically stable in no acute distress, feels significantly improved and except for the nausea feels back to normal.  Was given Solu-Medrol  here and will be given additional Pepcid .  Will need 4 hr observation will be closely reassessed.  Independent labs interpretation:  The following labs were independently interpreted: mild hypokalemia, otherwise within normal range  Independent visualization of imaging: -N/A  Consultation: - Consulted or discussed management/test interpretation w/ external professional: N/A  Risk Prescription drug management.       Final diagnoses:  Anaphylaxis, initial encounter    ED Discharge Orders          Ordered    EPINEPHrine  0.3 mg/0.3 mL IJ SOAJ injection  As needed       Note to Pharmacy: 2 each/box   11/09/23 1549               Celesta Coke K, DO 11/09/23 1704

## 2023-11-09 NOTE — H&P (Signed)
 History and Physical  Megan Wilkerson VWU:[id number] DOB: July 27, 1965 DOA: 11/09/2023  PCP: [place], Betty G, MD   Chief Complaint: Allergic reaction  HPI: Megan Wilkerson is a 58 y.o. female with medical history significant for asthma, anaphylactic reaction, PNES/pseudoseizures, OSA, T2DM, allergic rhinitis, GERD and peripheral neuropathy who presents to the ED for evaluation after allergic reaction.  Per daughter, patient was with family at [employer].  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  While they were sitting down, the waitress was delivering face to another family and when patient started smelling the fish, she started coughing and feeling like her throat was swollen.  She went to her car and had a seizure-like activity.  Her son administered her EpiPen  and gave her Benadryl  before calling EMS.  On arrival, EMS gave patient additional EpiPen .  Patient reports having some nausea but no vomiting, rash or urinary incontinence during this episode.  ED Course: Initial vitals show patient afebrile, normotensive with SpO2 100% on room air. Initial labs significant for K+ 3.2, bicarb 21, glucose 188 with normal CBC and kidney function. Pt received IV Solu-Medrol  125 mg x 1 and IV Pepcid  20 mg x 1. She was monitored in the ED and TRH was consulted for admission. On reassessment by EDP prior to admission, patient reported increased itching all over. She was found to have mild swelling to her chin and upper neck. She was given IV Benadryl  25 mg and right afterwards, she started feeling funny and had another episode of seizure-like activity that lasted a few minutes. She was injected with EpiPen  and received IV Ativan  2 mg x 1.  Patient slightly drowsy during evaluation for admission but oriented x 3 and answers all questions appropriately. Reports she has had these episodes for more than 10 years with her last episode in October 2024 when she was hospitalized and had a  brief ICU stay for anaphylactic reaction.  She was evaluated by neurology at that time who did not recommend any further workup or AED.   Review of Systems: Please see HPI for pertinent positives and negatives. A complete 10 system review of systems are otherwise negative.  Past Medical History:  Diagnosis Date   Allergy  2012   NUMEROUS ALLERGIES AND ANAPHYLACTIC REACTIONS SINCE 2012   Anaphylactic reaction    several ED visits for same since 01/2012   Anemia    IDA    Asthma    Blood transfusion without reported diagnosis 1988   AFTER  CHILDBIRTH   GERD    Glaucoma    Hives of unknown origin    chronic and recurrent urticaria since 01/2012   HYPERLIPIDEMIA, WITH LOW HDL    MIGRAINE HEADACHE    PERIPHERAL NEUROPATHY    POSITIVE PPD 1987   s/p 8mo tx   Seizures (HCC)    Sleep apnea 2018   CPAP   Past Surgical History:  Procedure Laterality Date   ABDOMINAL HYSTERECTOMY  1996   APPENDECTOMY  1992   CESAREAN SECTION  1989   CHOLECYSTECTOMY  04/22/2011   Procedure: LAPAROSCOPIC CHOLECYSTECTOMY WITH INTRAOPERATIVE CHOLANGIOGRAM;  Surgeon: Lockie Rima, MD;  Location: [facility] OR;  Service: General;  Laterality: N/A;   CYST REMOVAL HAND Right 1997   Social History:  reports that she has never smoked. She has never used smokeless tobacco. She reports that she does not drink alcohol and does not use drugs.  Allergies  Allergen Reactions   Milk-Related Compounds  Mouth itching, dizziness, coughing, wheezing and SOB   Other Anaphylaxis and Other (See Comments)    Ricci Chambers rancher gummies and paprika    Peanuts [Peanut  Oil] Anaphylaxis and Itching   Shellfish Allergy  Anaphylaxis   Corn Dextrin Hives and Other (See Comments)    Plain corn ok, popcorn causes hives   Gluten Meal Cough   Soy Allergy  (Obsolete) Cough   Sunflower Oil Itching   Tomato Itching   Wheat Swelling   Morphine Itching and Rash    Family History  Problem Relation Age of Onset   Prostate cancer Father     Hypertension Father    Heart disease Father    Diabetes Mother    Diabetes Brother    Alcohol abuse Other    Arthritis Other    Hypertension Other    Seizures Sister    Colon cancer Neg Hx    Colon polyps Neg Hx    Esophageal cancer Neg Hx    Rectal cancer Neg Hx    Stomach cancer Neg Hx      Prior to Admission medications   Medication Sig Start Date End Date Taking? Authorizing Provider  diphenhydrAMINE  (BENADRYL ) 25 MG tablet Take 1 tablet (25 mg total) by mouth every 6 (six) hours as needed. 11/09/23  Yes Cheyenne Cotta, MD  Accu-Chek Softclix Lancets lancets Use to test blood sugar daily. 05/08/23   [place], Betty G, MD  albuterol  (VENTOLIN  HFA) [phone] Base) MCG/ACT inhaler TAKE 2 PUFFS BY MOUTH EVERY 6 HOURS AS NEEDED FOR WHEEZE OR SHORTNESS OF BREATH 05/01/23   [place], Betty G, MD  Blood Glucose Monitoring Suppl (ACCU-CHEK GUIDE) w/Device KIT Use to test blood sugar daily. 05/08/23   [place], Betty G, MD  brimonidine  (ALPHAGAN ) 0.15 % ophthalmic solution Place 1 drop into both eyes 2 (two) times daily.    [provider]  budesonide -formoterol  (SYMBICORT ) 80-4.5 MCG/ACT inhaler Inhale 2 puffs into the lungs 2 (two) times daily. 03/21/23 11/09/23  [place], Rema Care, DO  EPINEPHrine  0.3 mg/0.3 mL IJ SOAJ injection Inject 0.3 mg into the muscle as needed for anaphylaxis. 11/09/23   Kingsley, Victoria K, DO  glucose blood (ACCU-CHEK GUIDE TEST) test strip Use to test blood sugar daily. 05/08/23   [place], Betty G, MD  hydrOXYzine  (ATARAX ) 25 MG tablet Take 1 tablet (25 mg total) by mouth 3 (three) times daily as needed for anxiety. 03/21/23   [place], Rema Care, DO  latanoprost  (XALATAN ) 0.005 % ophthalmic solution Place 1 drop into both eyes at bedtime.  10/31/17   [provider]  loratadine  (CLARITIN ) 10 MG tablet Take 1 tablet (10 mg total) by mouth daily. 03/21/23 11/09/23  [place], Rema Care, DO  pantoprazole  (PROTONIX ) 40 MG tablet Take 1 tablet (40 mg total) by mouth daily.  03/21/23   [place], Rema Care, DO  potassium chloride  SA (KLOR-CON  M) 20 MEQ tablet Take 1 tablet (20 mEq total) by mouth daily. 04/06/23   [place], Betty G, MD  rosuvastatin  (CRESTOR ) 10 MG tablet TAKE 1 TABLET BY MOUTH EVERY DAY 06/02/23   [place], Betty G, MD  Semaglutide ,0.25 or 0.5MG /DOS, (OZEMPIC , 0.25 OR 0.5 MG/DOSE,) 2 MG/3ML SOPN INJECT 0.25 MG SUBCUTANEOUSLY WEEKLY X 4 WKS THEN 0.5 MG WEEKLY 09/11/23   [place], Betty G, MD    Physical Exam: BP 133/71   Pulse (!) 107   Temp 98.2 F (36.8 C) (Oral)   Resp 17   SpO2 97%  General: Pleasant, drowsy appearing middle-aged woman laying in bed. No  acute distress. HEENT: [place]/AT. Anicteric sclera. Mild neck swelling. No posterior oropharyngeal edema. No stridor. CV: Tachycardic. Regular rhythm. No murmurs, rubs, or gallops. No LE edema Pulmonary: Lungs CTAB. Normal effort. No wheezing or rales. Abdominal: Soft, nontender, nondistended. Normal bowel sounds. Extremities: Palpable radial and DP pulses. Normal ROM. Skin: Warm and dry. Few hives around lips Neuro: Mild somnolent but oriented x 3. Moves all extremities. Normal sensation to light touch. No focal deficit. Psych: Normal mood and affect          Labs on Admission:  Basic Metabolic Panel: Recent Labs  Lab 11/09/23 1406  NA 139  K 3.2*  CL 104  CO2 21*  GLUCOSE 188*  BUN 10  CREATININE 0.81  CALCIUM  9.3   Liver Function Tests: Recent Labs  Lab 11/09/23 1406  AST 19  ALT 14  ALKPHOS 76  BILITOT 0.7  PROT 7.4  ALBUMIN 4.2   No results for input(s): LIPASE, AMYLASE in the last 168 hours. No results for input(s): AMMONIA  in the last 168 hours. CBC: Recent Labs  Lab 11/09/23 1406  WBC 7.0  NEUTROABS 2.1  HGB 13.7  HCT 41.5  MCV 91.4  PLT 245   Cardiac Enzymes: No results for input(s): CKTOTAL, CKMB, CKMBINDEX, TROPONINI in the last 168 hours. BNP (last 3 results) No results for input(s): BNP in the last 8760 hours.  ProBNP (last 3 results) No  results for input(s): PROBNP in the last 8760 hours.  CBG: No results for input(s): GLUCAP in the last 168 hours.  Radiological Exams on Admission: No results found. Assessment/Plan Amanee Iacovelli is a 58 y.o. female with medical history significant for asthma, anaphylactic reaction, psychogenic nonepileptic seizures (PNES)/pseudoseizures, OSA, T2DM, allergic rhinitis, GERD and peripheral neuropathy who presents to the ED for evaluation after allergic reaction found to have seizure-like activity.  # Seizure-like activity # Hx of PNES/pseudoseizures - Patient had seizure-like activity at a restaurant and a second episode in the ED after receiving IV Benadryl  for possible second allergic reaction - On chart review, she is extensive evaluation by neurology for these seizure-like episodes with these episodes captured while on cEEG without any seizure activity. No neurology follow-up or AED recommended during last hospitalization.  - She remains postictal but hemodynamically stable and oriented x 3. 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 presented with allergic reaction after smelling fish at a restaurant - Symptoms included coughing and slight throat swelling followed by seizure-like activity that resolved with EpiPen  and Benadryl  - On exam, she has mild upper neck swelling but protecting airway, no stridor or posterior oropharyngeal edema - Continue as needed EpiPen   # T2DM - Last A1c 6.0% 4 months ago - Blood glucose elevated to 188 on CMP - SSI with meals, CBG monitoring  # Hypokalemia - K+ slightly low at 3.2, repleted with KCl 40 mEq x 1 - Follow-up mag and morning BMP  # Asthma - Mild tachypnea but no respiratory distress or wheezing on exam - Continue home as needed albuterol  inhaler - Supplemental O2 as needed  # HLD - Continue rosuvastatin   # GERD -  Continue Protonix   # Allergic rhinitis - Continue loratadine   # Vitamin D  deficiency - Follow-up vitamin D  levels  DVT prophylaxis: Lovenox      Code Status: Full Code  Consults called: None  Family Communication: Discussed admission with daughter at bedside  Severity of Illness:  The appropriate patient status for this patient is OBSERVATION. Observation status is judged to be reasonable and necessary in order to provide the required intensity of service to ensure the patient's safety. The patient's presenting symptoms, physical exam findings, and initial radiographic and laboratory data in the context of their medical condition is felt to place them at decreased risk for further clinical deterioration. Furthermore, it is anticipated that the patient will be medically stable for discharge from the hospital within 2 midnights of admission.   Level of care: Telemetry   This record has been created using Conservation officer, historic buildings. Errors have been sought and corrected, but may not always be located. Such creation errors do not reflect on the standard of care.   Vita Grip, MD 11/09/2023, 7:40 PM [facility] Pager: [phone] Isaiah 41:10   If 7PM-7AM, please contact night-coverage [email or web address] Password [email or web address]

## 2023-11-09 NOTE — ED Triage Notes (Signed)
 Pt BIBA from [facility] for allergic reaction. 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 Son gave epipen , pt took 50mg  po benadryl . EMS gave epipen  about 73m pta d/t BP dropping.   130/71 HR 90 RR 18 100% Cbg 131

## 2023-11-09 NOTE — ED Notes (Signed)
 This nurse in to medicate patient with Benadryl  and Epi due to itching and starting to get hives. Patient given Benadryl  and was talkative, then she started to have a seizure. Turned on her side, called for assistance and MD. MD and other ED staff arrived. Patient foaming out mouth, patient on her side. Medicated with Ativan  for seizures. JRPRN

## 2023-11-09 NOTE — ED Provider Notes (Addendum)
 Patient continued to have some swelling to her chin and upper neck.  She then started to have more itching all over.  It was decided to give the patient more Benadryl  and epinephrine .  Shortly after that she had a seizure.  It looks like she has a history of pseudoseizures.  She improved with some Ativan .  Hospitalist was called to admit for allergic reaction and observation.  CRITICAL CARE Performed by: Cheyenne Cotta Total critical care time: 35 minutes Critical care time was exclusive of separately billable procedures and treating other patients. Critical care was necessary to treat or prevent imminent or life-threatening deterioration. Critical care was time spent personally by me on the following activities: development of treatment plan with patient and/or surrogate as well as nursing, discussions with consultants, evaluation of patient's response to treatment, examination of patient, obtaining history from patient or surrogate, ordering and performing treatments and interventions, ordering and review of laboratory studies, ordering and review of radiographic studies, pulse oximetry and re-evaluation of patient's condition.  Cheyenne Cotta, MD 11/09/23 Sanjuanita Cruz    Cheyenne Cotta, MD 11/09/23 [phone]

## 2023-11-10 DIAGNOSIS — R569 Unspecified convulsions: Secondary | ICD-10-CM | POA: Diagnosis not present

## 2023-11-10 LAB — BASIC METABOLIC PANEL WITH GFR
Anion gap: 11 (ref 5–15)
BUN: 10 mg/dL (ref 6–20)
CO2: 19 mmol/L — ABNORMAL LOW (ref 22–32)
Calcium: 9.5 mg/dL (ref 8.9–10.3)
Chloride: 109 mmol/L (ref 98–111)
Creatinine, Ser: 0.78 mg/dL (ref 0.44–1.00)
GFR, Estimated: >60 mL/min (ref >60)
Glucose, Bld: 111 mg/dL — ABNORMAL HIGH (ref 70–99)
Potassium: 4 mmol/L (ref 3.5–5.1)
Sodium: 139 mmol/L (ref 135–145)

## 2023-11-10 LAB — CBC
HCT: 42.2 % (ref 36.0–46.0)
Hemoglobin: 14.2 g/dL (ref 12.0–15.0)
MCH: 30.3 pg (ref 26.0–34.0)
MCHC: 33.6 g/dL (ref 30.0–36.0)
MCV: 90.2 fL (ref 80.0–100.0)
Platelets: 143 10*3/uL — ABNORMAL LOW (ref 150–400)
RBC: 4.68 MIL/uL (ref 3.87–5.11)
RDW: 12.5 % (ref 11.5–15.5)
WBC: 6.3 10*3/uL (ref 4.0–10.5)
nRBC: 0 % (ref 0.0–0.2)

## 2023-11-10 LAB — GLUCOSE, CAPILLARY
Glucose-Capillary: 101 mg/dL — ABNORMAL HIGH (ref 70–99)
Glucose-Capillary: 121 mg/dL — ABNORMAL HIGH (ref 70–99)

## 2023-11-10 LAB — HIV ANTIBODY (ROUTINE TESTING W REFLEX): HIV Screen 4th Generation wRfx: NONREACTIVE

## 2023-11-10 LAB — HEMOGLOBIN A1C
Hgb A1c MFr Bld: 5.5 % (ref 4.8–5.6)
Mean Plasma Glucose: 111.15 mg/dL

## 2023-11-10 LAB — VITAMIN D 25 HYDROXY (VIT D DEFICIENCY, FRACTURES): Vit D, 25-Hydroxy: 36.95 ng/mL (ref 30–100)

## 2023-11-10 MED ORDER — EPINEPHRINE 0.3 MG/0.3ML IJ SOAJ: 0.3000 mg | INTRAMUSCULAR | 0 refills | Status: AC | PRN

## 2023-11-10 NOTE — Discharge Summary (Signed)
 Physician Discharge Summary  Megan Wilkerson ZOX:[id number] DOB: [birth year] DOA: 11/09/2023  PCP: [place], Betty G, MD  Admit date: 11/09/2023 Discharge date: 11/10/2023  Admitted From: Home Discharge disposition: Home   Recommendations for Outpatient Follow-Up:   Referral placed to allergy  as patient did not go after last episode Prescription for EpiPen  sent to pharmacy   Discharge Diagnosis:   Principal Problem:   Seizure-like activity [facility] Active Problems:   Allergic reaction    Discharge Condition: Improved.  Diet recommendation: Low sodium, heart healthy.  Carbohydrate-modified  Wound care: None.  Code status: Full.   History of Present Illness:   Megan Wilkerson is a 58 y.o. female with medical history significant for asthma, anaphylactic reaction, PNES/pseudoseizures, OSA, T2DM, allergic rhinitis, GERD and peripheral neuropathy who presents to the ED for evaluation after allergic reaction.  Per daughter, patient was with family at [employer].  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  While they were sitting down, the waitress was delivering face to another family and when patient started smelling the fish, she started coughing and feeling like her throat was swollen.  She went to her car and had a seizure-like activity.  Her son administered her EpiPen  and gave her Benadryl  before calling EMS.  On arrival, EMS gave patient additional EpiPen .  Patient reports having some nausea but no vomiting, rash or urinary incontinence during this episode.   ED Course: Initial vitals show patient afebrile, normotensive with SpO2 100% on room air. Initial labs significant for K+ 3.2, bicarb 21, glucose 188 with normal CBC and kidney function. Pt received IV Solu-Medrol  125 mg x 1 and IV Pepcid  20 mg x 1. She was monitored in the ED and TRH was consulted for admission. On reassessment by EDP prior to admission, patient reported increased itching  all over. She was found to have mild swelling to her chin and upper neck. She was given IV Benadryl  25 mg and right afterwards, she started feeling funny and had another episode of seizure-like activity that lasted a few minutes. She was injected with EpiPen  and received IV Ativan  2 mg x 1.  Patient slightly drowsy during evaluation for admission but oriented x 3 and answers all questions appropriately. Reports she has had these episodes for more than 10 years with her last episode in October 2024 when she was hospitalized and had a brief ICU stay for anaphylactic reaction.  She was evaluated by neurology at that time who did not recommend any further workup or AED.      Hospital Course by Problem:   # Seizure-like activity # Hx of PNES/pseudoseizures - Patient had seizure-like activity at a restaurant and a second episode in the ED after receiving IV Benadryl  for possible second allergic reaction - On chart review, she is extensive evaluation by neurology for these seizure-like episodes with these episodes captured while on cEEG without any seizure activity. No neurology follow-up or AED recommended during last hospitalization.    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 presented with allergic reaction after smelling fish at a restaurant - Symptoms included coughing and slight throat swelling followed by seizure-like activity that resolved with EpiPen  and Benadryl  - Continue as needed EpiPen  -No sign of continued reaction on exam   # T2DM - Last A1c 6.0% 4 months ago    # Hypokalemia - Repleted   # Asthma - Not in exacerbation   # HLD -  Continue rosuvastatin    # GERD - Continue Protonix    # Allergic rhinitis - Continue loratadine        Medical Consultants:      Discharge Exam:   Vitals:   11/10/23 0438 11/10/23 0839  BP: 133/84 (!) 149/95  Pulse: 71 79  Resp: 18 18  Temp: 97.9 F (36.6 C) 98.6 F (37 C)  SpO2: 99% 96%    Vitals:   11/09/23 2024 11/10/23 0050 11/10/23 0438 11/10/23 0839  BP: 114/75 127/87 133/84 (!) 149/95  Pulse: (!) 107 86 71 79  Resp: 18 18 18 18   Temp: 98.2 F (36.8 C) 98 F (36.7 C) 97.9 F (36.6 C) 98.6 F (37 C)  TempSrc:      SpO2: 96% 100% 99% 96%    General exam: Appears calm and comfortable.    The results of significant diagnostics from this hospitalization (including imaging, microbiology, ancillary and laboratory) are listed below for reference.     Procedures and Diagnostic Studies:   No results found.   Labs:   Basic Metabolic Panel: Recent Labs  Lab 11/09/23 1406 11/09/23 2051 11/10/23 0540  NA 139  --  139  K 3.2*  --  4.0  CL 104  --  109  CO2 21*  --  19*  GLUCOSE 188*  --  111*  BUN 10  --  10  CREATININE 0.81  --  0.78  CALCIUM  9.3  --  9.5  MG  --  2.0  --   PHOS  --  3.4  --    GFR CrCl cannot be calculated (Unknown ideal weight.). Liver Function Tests: Recent Labs  Lab 11/09/23 1406  AST 19  ALT 14  ALKPHOS 76  BILITOT 0.7  PROT 7.4  ALBUMIN 4.2   No results for input(s): LIPASE, AMYLASE in the last 168 hours. No results for input(s): AMMONIA  in the last 168 hours. Coagulation profile No results for input(s): INR, PROTIME in the last 168 hours.  CBC: Recent Labs  Lab 11/09/23 1406 11/10/23 0450  WBC 7.0 6.3  NEUTROABS 2.1  --   HGB 13.7 14.2  HCT 41.5 42.2  MCV 91.4 90.2  PLT 245 143*   Cardiac Enzymes: Recent Labs  Lab 11/09/23 2051  CKTOTAL 41   BNP: Invalid input(s): POCBNP CBG: Recent Labs  Lab 11/09/23 2038 11/10/23 0739  GLUCAP 215* 101*   D-Dimer No results for input(s): DDIMER in the last 72 hours. Hgb A1c Recent Labs    11/09/23 2051  HGBA1C 5.5   Lipid Profile No results for input(s): CHOL, HDL, LDLCALC, TRIG, CHOLHDL, LDLDIRECT in the last 72 hours. Thyroid  function studies No results for input(s): TSH, T4TOTAL, T3FREE, THYROIDAB in the last 72  hours.  Invalid input(s): FREET3 Anemia work up No results for input(s): VITAMINB12, FOLATE, FERRITIN, TIBC, IRON, RETICCTPCT in the last 72 hours. Microbiology No results found for this or any previous visit (from the past 240 hours).   Discharge Instructions:   Discharge Instructions     Ambulatory referral to Allergy    Complete by: As directed    Diet - low sodium heart healthy   Complete by: As directed    Diet Carb Modified   Complete by: As directed    Increase activity slowly   Complete by: As directed       Allergies as of 11/10/2023       Reactions   Milk-related Compounds    Mouth itching, dizziness, coughing, wheezing and SOB  Other Anaphylaxis, Other (See Comments)   Ricci Chambers rancher gummies and paprika   Peanuts [peanut  Oil] Anaphylaxis, Itching   Shellfish Allergy  Anaphylaxis   Corn Dextrin Hives, Other (See Comments)   Plain corn ok, popcorn causes hives   Gluten Meal Cough   Soy Allergy  (obsolete) Cough   Sunflower Oil Itching   Tomato Itching   Wheat Swelling   Morphine Itching, Rash        Medication List     TAKE these medications    Accu-Chek Guide Test test strip Generic drug: glucose blood Use to test blood sugar daily.   Accu-Chek Guide w/Device Kit Use to test blood sugar daily.   Accu-Chek Softclix Lancets lancets Use to test blood sugar daily.   brimonidine  0.15 % ophthalmic solution Commonly known as: ALPHAGAN  Place 1 drop into both eyes 2 (two) times daily.   budesonide -formoterol  80-4.5 MCG/ACT inhaler Commonly known as: Symbicort  Inhale 2 puffs into the lungs 2 (two) times daily.   diphenhydrAMINE  25 MG tablet Commonly known as: BENADRYL  Take 1 tablet (25 mg total) by mouth every 6 (six) hours as needed.   EPINEPHrine  0.3 mg/0.3 mL Soaj injection Commonly known as: EPI-PEN Inject 0.3 mg into the muscle as needed for anaphylaxis.   hydrOXYzine  25 MG tablet Commonly known as: ATARAX  Take 1 tablet (25  mg total) by mouth 3 (three) times daily as needed for anxiety.   latanoprost  0.005 % ophthalmic solution Commonly known as: XALATAN  Place 1 drop into both eyes at bedtime.   loratadine  10 MG tablet Commonly known as: Claritin  Take 1 tablet (10 mg total) by mouth daily.   Ozempic  (0.25 or 0.5 MG/DOSE) 2 MG/3ML Sopn Generic drug: Semaglutide (0.25 or 0.5MG /DOS) INJECT 0.25 MG SUBCUTANEOUSLY WEEKLY X 4 WKS THEN 0.5 MG WEEKLY   pantoprazole  40 MG tablet Commonly known as: Protonix  Take 1 tablet (40 mg total) by mouth daily.   potassium chloride  SA 20 MEQ tablet Commonly known as: KLOR-CON  M Take 1 tablet (20 mEq total) by mouth daily.   rosuvastatin  10 MG tablet Commonly known as: CRESTOR  TAKE 1 TABLET BY MOUTH EVERY DAY   Ventolin  HFA 108 (90 Base) MCG/ACT inhaler Generic drug: albuterol  TAKE 2 PUFFS BY MOUTH EVERY 6 HOURS AS NEEDED FOR WHEEZE OR SHORTNESS OF BREATH        Follow-up Information     [place], Betty G, MD. Call in 3 days.   Specialty: Family Medicine Contact information: [address] [place] [address] [phone]                  Time coordinating discharge: 45 min  Signed:  Enrigue Harvard DO  [facility] 11/10/2023, 9:41 AM

## 2023-11-10 NOTE — Plan of Care (Signed)

## 2023-11-10 NOTE — Progress Notes (Signed)
SATURATION QUALIFICATIONS: (This note is used to comply with regulatory documentation for home oxygen)  Patient Saturations on Room Air at Rest = 98%  Patient Saturations on Room Air while Ambulating = 95%  Patient Saturations on 0 Liters of oxygen while Ambulating = 98%  Please briefly explain why patient needs home oxygen:

## 2023-11-10 NOTE — Progress Notes (Signed)
   11/10/23 0001  BiPAP/CPAP/SIPAP  BiPAP/CPAP/SIPAP Pt Type Adult  Reason BIPAP/CPAP not in use Other(comment) (PT states she does not use one.)

## 2023-11-10 NOTE — Progress Notes (Signed)
   11/10/23 0957  TOC Brief Assessment  Insurance and Status Reviewed  Patient has primary care physician Yes  Home environment has been reviewed Home w/ family  Prior level of function: Independent  Prior/Current Home Services No current home services  Social Drivers of Health Review SDOH reviewed no interventions necessary  Readmission risk has been reviewed Yes  Transition of care needs no transition of care needs at this time

## 2023-11-13 ENCOUNTER — Telehealth: Payer: Self-pay

## 2023-11-13 NOTE — Transitions of Care (Post Inpatient/ED Visit) (Signed)
 11/13/2023  Name: Megan Wilkerson MRN: [id number] DOB: Oct 26, 1965  Today's TOC FU Call Status: Today's TOC FU Call Status:: Successful TOC FU Call Completed TOC FU Call Complete Date: 11/13/23 Patient's Name and Date of Birth confirmed.  Transition Care Management Follow-up Telephone Call Date of Discharge: 11/10/23 Discharge Facility: [facility]) Type of Discharge: Inpatient Admission Primary Inpatient Discharge Diagnosis:: convulsion Any questions or concerns?: No  Items Reviewed: Medications obtained,verified, and reconciled?: Yes (Medications Reviewed) Any new allergies since your discharge?: No Dietary orders reviewed?: Yes Do you have support at home?: Yes People in Home [RPT]: child(ren), adult  Medications Reviewed Today: Medications Reviewed Today     Reviewed by Darrall Ellison, LPN (Licensed Practical Nurse) on 11/13/23 at 1131  Med List Status: <None>   Medication Order Taking? Sig Documenting Provider Last Dose Status Informant  Accu-Chek Softclix Lancets lancets [id number] Yes Use to test blood sugar daily. [place], Betty G, MD  Active Self, Pharmacy Records, Multiple Informants  albuterol  (VENTOLIN  HFA) 108 [phone] Base) MCG/ACT inhaler [id number] Yes TAKE 2 PUFFS BY MOUTH EVERY 6 HOURS AS NEEDED FOR WHEEZE OR SHORTNESS OF BREATH  Patient taking differently: Inhale 2 puffs into the lungs every 6 (six) hours as needed for wheezing or shortness of breath.   [place], Betty G, MD  Active Self, Pharmacy Records, Multiple Informants  brimonidine  (ALPHAGAN ) 0.15 % ophthalmic solution [id number] Yes Place 1 drop into both eyes 2 (two) times daily. [provider]  Active Self, Pharmacy Records, Multiple Informants  budesonide -formoterol  (SYMBICORT ) 80-4.5 MCG/ACT inhaler [id number] Yes Inhale 2 puffs into the lungs 2 (two) times daily.  Patient taking differently: Inhale 2 puffs into the lungs daily as needed (wheezing/sob).   [place], Eric J, DO  Active Self,  Pharmacy Records, Multiple Informants  diphenhydrAMINE  (BENADRYL ) 25 MG tablet [id number] Yes Take 1 tablet (25 mg total) by mouth every 6 (six) hours as needed. Zammit, Joseph, MD  Active   EPINEPHrine  0.3 mg/0.3 mL IJ SOAJ injection [id number] Yes Inject 0.3 mg into the muscle as needed for anaphylaxis. Vann, Jessica U, DO  Active   glucose blood (ACCU-CHEK GUIDE TEST) test strip [id number] Yes Use to test blood sugar daily. [place], Betty G, MD  Active Self, Pharmacy Records, Multiple Informants  hydrOXYzine  (ATARAX ) 25 MG tablet [id number] Yes Take 1 tablet (25 mg total) by mouth 3 (three) times daily as needed for anxiety. [place], Eric J, DO  Active Self, Pharmacy Records, Multiple Informants  latanoprost  (XALATAN ) 0.005 % ophthalmic solution [id number] Yes Place 1 drop into both eyes at bedtime.  [provider]  Active Self, Pharmacy Records, Multiple Informants  loratadine  (CLARITIN ) 10 MG tablet [id number] Yes Take 1 tablet (10 mg total) by mouth daily. [place], Rema Care, DO  Active   pantoprazole  (PROTONIX ) 40 MG tablet [id number] Yes Take 1 tablet (40 mg total) by mouth daily. [place], Rema Care, DO  Active Self, Pharmacy Records, Multiple Informants  potassium chloride  SA (KLOR-CON  M) 20 MEQ tablet [id number] Yes Take 1 tablet (20 mEq total) by mouth daily. [place], Betty G, MD  Active Self, Pharmacy Records, Multiple Informants  rosuvastatin  (CRESTOR ) 10 MG tablet [id number]  TAKE 1 TABLET BY MOUTH EVERY DAY  Patient not taking: Reported on 11/13/2023   [place], Betty G, MD  Active Self, Pharmacy Records, Multiple Informants  Semaglutide ,0.25 or 0.5MG /DOS, (OZEMPIC , 0.25 OR 0.5 MG/DOSE,) 2 MG/3ML SOPN [id number] Yes INJECT 0.25 MG SUBCUTANEOUSLY WEEKLY X 4 WKS THEN 0.5 MG WEEKLY  Patient taking differently: Inject 0.25  mg into the skin once a week.   [place], Betty G, MD  Active Self, Pharmacy Records, Multiple Informants            Home Care and Equipment/Supplies: Were Home Health  Services Ordered?: NA Any new equipment or medical supplies ordered?: NA  Functional Questionnaire: Do you need assistance with bathing/showering or dressing?: No Do you need assistance with meal preparation?: No Do you need assistance with eating?: No Do you have difficulty maintaining continence: No Do you need assistance with getting out of bed/getting out of a chair/moving?: No Do you have difficulty managing or taking your medications?: No  Follow up appointments reviewed: PCP Follow-up appointment confirmed?: Yes Date of PCP follow-up appointment?: 11/15/23 Follow-up Provider: [place] [facility] Follow-up appointment confirmed?: Yes Date of Specialist follow-up appointment?: 12/08/23 Follow-Up Specialty Provider:: alergist Do you need transportation to your follow-up appointment?: No Do you understand care options if your condition(s) worsen?: Yes-patient verbalized understanding    SIGNATURE Darrall Ellison, LPN [facility] Nurse Health Advisor Direct Dial [phone]

## 2023-11-15 ENCOUNTER — Ambulatory Visit (INDEPENDENT_AMBULATORY_CARE_PROVIDER_SITE_OTHER)

## 2023-11-15 ENCOUNTER — Ambulatory Visit (INDEPENDENT_AMBULATORY_CARE_PROVIDER_SITE_OTHER): Admitting: Family Medicine

## 2023-11-15 ENCOUNTER — Encounter: Payer: Self-pay | Admitting: Family Medicine

## 2023-11-15 VITALS — BP 110/80 | HR 108 | Temp 100.1°F | Resp 16 | Ht 66.0 in | Wt 203.0 lb

## 2023-11-15 DIAGNOSIS — T7800XA Anaphylactic reaction due to unspecified food, initial encounter: Secondary | ICD-10-CM | POA: Diagnosis not present

## 2023-11-15 DIAGNOSIS — F445 Conversion disorder with seizures or convulsions: Secondary | ICD-10-CM | POA: Diagnosis not present

## 2023-11-15 DIAGNOSIS — R051 Acute cough: Secondary | ICD-10-CM | POA: Diagnosis not present

## 2023-11-15 DIAGNOSIS — J4540 Moderate persistent asthma, uncomplicated: Secondary | ICD-10-CM

## 2023-11-15 DIAGNOSIS — R059 Cough, unspecified: Secondary | ICD-10-CM | POA: Diagnosis not present

## 2023-11-15 DIAGNOSIS — R509 Fever, unspecified: Secondary | ICD-10-CM | POA: Diagnosis not present

## 2023-11-15 MED ORDER — PREDNISONE 20 MG PO TABS: 40.0000 mg | ORAL_TABLET | Freq: Every day | ORAL | 0 refills | Status: AC

## 2023-11-15 MED ORDER — BENZONATATE 100 MG PO CAPS: 100.0000 mg | ORAL_CAPSULE | Freq: Two times a day (BID) | ORAL | 0 refills | Status: AC | PRN

## 2023-11-15 NOTE — Assessment & Plan Note (Signed)
 Last episode trigger by smelling fish. Symptoms have resolved, except for cough. She has EpiPen  at home and Benadryl  25 mg. Continue avoiding exposure. Instructed about warning signs. Has appt with Dr Burdette Carolin on 12/08/23.

## 2023-11-15 NOTE — Assessment & Plan Note (Addendum)
 Has seen neuro and did not seem to be epilepsia. Evaluated by neuro in 03/2018. Brain MRI otherwise negative. EEG while having episodes in 02/2023: This study is within normal limits. No seizures or epileptiform discharges were seen throughout the recording.

## 2023-11-15 NOTE — Patient Instructions (Addendum)
 A few things to remember from today's visit:  Anaphylaxis due to food  Psychogenic nonepileptic seizure  Moderate persistent asthma without complication - Plan: predniSONE  (DELTASONE ) 20 MG tablet  Acute cough - Plan: DG Chest 2 View, benzonatate  (TESSALON ) 100 MG capsule Take Prednisone  with breakfast. Symbicort  2 puff 2 times daily. Albuterol  inh 2 puff every 6 hours for a week then as needed for wheezing or shortness of breath.   Keep appt with allergy  specialist. Plain Mucinex may help with cough. Monitor for new symptoms.  If you need refills for medications you take chronically, please call your pharmacy. Do not use My Chart to request refills or for acute issues that need immediate attention. If you send a my chart message, it may take a few days to be addressed, specially if I am not in the office.  Please be sure medication list is accurate. If a new problem present, please set up appointment sooner than planned today.

## 2023-11-15 NOTE — Progress Notes (Signed)
 Chief Complaint  Patient presents with   Hospitalization Follow-up   HPI: Megan Wilkerson is a 58 y.o. female with a PMHx significant for asthma, HLD, DM II, OSA, GERD, pseudoseizures, seafood allergies, and peripheral neuropathy, who is here today for hospital follow up.   Patient was hospitalized from 6/12-6/13 for an allergic reaction. ED report states patient was with family at [facility]. 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 While they were sitting down, the waitress was delivering face to another family and when patient started smelling the fish, she started coughing and feeling like her throat was swollen. She went to her car and had a seizure-like activity. Her son administered her EpiPen  and gave her Benadryl  before calling EMS. On arrival, she received additional EpiPen  x 2. Negative for oral edema, stridor,or dysphagia. + Mild dysphonia.  She has an appointment with immunologist scheduled for 7/11.  She has benadryl  at home and is going to pick up an EpiPen  from her pharmacy.   Lab Results  Component Value Date   NA 139 11/10/2023   CL 109 11/10/2023   K 4.0 11/10/2023   CO2 19 (L) 11/10/2023   BUN 10 11/10/2023   CREATININE 0.78 11/10/2023   GFRNONAA >60 11/10/2023   CALCIUM  9.5 11/10/2023   PHOS 3.4 11/09/2023   ALBUMIN 4.2 11/09/2023   GLUCOSE 111 (H) 11/10/2023   Lab Results  Component Value Date   WBC 6.3 11/10/2023   HGB 14.2 11/10/2023   HCT 42.2 11/10/2023   MCV 90.2 11/10/2023   PLT 143 (L) 11/10/2023   Lab Results  Component Value Date   CKTOTAL 41 11/09/2023   CKMB 1.3 02/11/2011   TROPONINI <0.30 02/11/2011   Seizure like activity:No associated LOC, urine/bowel incontinence,or postictal like symptoms. Evaluated by neuro in 03/2018. Brain MRI otherwise negative. EEG while having episodes in 02/2023: This study is within normal limits. No seizures or epileptiform discharges were seen throughout the  recording.   Cough/asthma:  Since she got out of the hospital, she has had a productive cough.  Also endorses some low-grade fever (100.1 last night), wheezing, little SOB, rhinorrhea, postnasal drainage, and rib soreness when coughing.  She is using her Symbicort   prn and Albuterol  inhaler prn. Not taking any cough suppressors.  Denies heartburn or known sick contacts.   Review of Systems  Constitutional:  Negative for activity change and appetite change.  HENT:  Positive for congestion and postnasal drip. Negative for sore throat.   Respiratory:  Positive for cough and wheezing.   Cardiovascular:  Negative for chest pain and palpitations.  Gastrointestinal:  Negative for abdominal pain, nausea and vomiting.  Endocrine: Negative for cold intolerance and heat intolerance.  Genitourinary:  Negative for decreased urine volume, dysuria and hematuria.  Musculoskeletal:  Negative for gait problem and myalgias.  Skin:  Negative for rash.  Allergic/Immunologic: Positive for environmental allergies.  Neurological:  Negative for syncope, facial asymmetry and weakness.  Psychiatric/Behavioral:  Negative for confusion and hallucinations.   See other pertinent positives and negatives in HPI.  Current Outpatient Medications on File Prior to Visit  Medication Sig Dispense Refill   Accu-Chek Softclix Lancets lancets Use to test blood sugar daily. 100 each 12   albuterol  (VENTOLIN  HFA) 108 (90 Base) MCG/ACT inhaler TAKE 2 PUFFS BY MOUTH EVERY 6 HOURS AS NEEDED FOR WHEEZE OR SHORTNESS OF BREATH (Patient taking differently: Inhale 2 puffs into the lungs every 6 (six) hours as needed  for wheezing or shortness of breath.) 18 each 2   brimonidine  (ALPHAGAN ) 0.15 % ophthalmic solution Place 1 drop into both eyes 2 (two) times daily.     budesonide -formoterol  (SYMBICORT ) 80-4.5 MCG/ACT inhaler Inhale 2 puffs into the lungs 2 (two) times daily. (Patient taking differently: Inhale 2 puffs into the lungs daily  as needed (wheezing/sob).) 10.2 g 3   diphenhydrAMINE  (BENADRYL ) 25 MG tablet Take 1 tablet (25 mg total) by mouth every 6 (six) hours as needed. 30 tablet 0   EPINEPHrine  0.3 mg/0.3 mL IJ SOAJ injection Inject 0.3 mg into the muscle as needed for anaphylaxis. 2 each 0   glucose blood (ACCU-CHEK GUIDE TEST) test strip Use to test blood sugar daily. 100 each 12   hydrOXYzine  (ATARAX ) 25 MG tablet Take 1 tablet (25 mg total) by mouth 3 (three) times daily as needed for anxiety. 90 tablet 0   latanoprost  (XALATAN ) 0.005 % ophthalmic solution Place 1 drop into both eyes at bedtime.      loratadine  (CLARITIN ) 10 MG tablet Take 1 tablet (10 mg total) by mouth daily. 90 tablet 0   pantoprazole  (PROTONIX ) 40 MG tablet Take 1 tablet (40 mg total) by mouth daily. 90 tablet 0   potassium chloride  SA (KLOR-CON  M) 20 MEQ tablet Take 1 tablet (20 mEq total) by mouth daily. 30 tablet 1   rosuvastatin  (CRESTOR ) 10 MG tablet TAKE 1 TABLET BY MOUTH EVERY DAY 90 tablet 1   Semaglutide ,0.25 or 0.5MG /DOS, (OZEMPIC , 0.25 OR 0.5 MG/DOSE,) 2 MG/3ML SOPN INJECT 0.25 MG SUBCUTANEOUSLY WEEKLY X 4 WKS THEN 0.5 MG WEEKLY (Patient taking differently: Inject 0.25 mg into the skin once a week.) 3 mL 1   No current facility-administered medications on file prior to visit.    Past Medical History:  Diagnosis Date   Allergy  2012   NUMEROUS ALLERGIES AND ANAPHYLACTIC REACTIONS SINCE 2012   Anaphylactic reaction    several ED visits for same since 01/2012   Anemia    IDA    Asthma    Blood transfusion without reported diagnosis 1988   AFTER  CHILDBIRTH   GERD    Glaucoma    Hives of unknown origin    chronic and recurrent urticaria since 01/2012   HYPERLIPIDEMIA, WITH LOW HDL    MIGRAINE HEADACHE    PERIPHERAL NEUROPATHY    POSITIVE PPD 1987   s/p 50mo tx   Seizures (HCC)    Sleep apnea 2018   CPAP   Allergies  Allergen Reactions   Capsicum Annuum Extract & Derivative (Bell Pepper) [Capsicum] Anaphylaxis    Milk-Related Compounds Anaphylaxis   Other Anaphylaxis and Other (See Comments)    Ricci Chambers rancher gummies   Peanuts [Peanut  Oil] Anaphylaxis and Itching   Shellfish Allergy  Anaphylaxis   Soy Allergy  (Obsolete) Other (See Comments) and Cough    Patient can't remember exact reaction    Tomato Anaphylaxis   Corn Dextrin Hives and Other (See Comments)    Plain corn ok, popcorn causes hives   Gluten Meal Other (See Comments) and Cough    Patient can't remember exact reaction    Morphine Itching and Rash   Sunflower Oil Itching and Other (See Comments)    Patient can't remember exact reaction    Wheat Nausea Only and Swelling    Patient can't remember exact reaction     Social History   Socioeconomic History   Marital status: Married    Spouse name: Sheral Pfahler   Number of children: Not  on file   Years of education: Not on file   Highest education level: Not on file  Occupational History   Not on file  Tobacco Use   Smoking status: Never   Smokeless tobacco: Never  Vaping Use   Vaping status: Never Used  Substance and Sexual Activity   Alcohol use: No   Drug use: No   Sexual activity: Not on file  Other Topics Concern   Not on file  Social History Narrative   Married, lives with spouse, son and nephew   Futures trader, 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   Social Drivers of Corporate investment banker Strain: Not on file  Food Insecurity: No Food Insecurity (11/09/2023)   Hunger Vital Sign    Worried About Running Out of Food in the Last Year: Never true    Ran Out of Food in the Last Year: Never true  Transportation Needs: No Transportation Needs (11/09/2023)   PRAPARE - Administrator, Civil Service (Medical): No    Lack of Transportation (Non-Medical): No  Physical Activity: Inactive (04/24/2018)   Exercise Vital Sign    Days of Exercise per Week: 0 days    Minutes of Exercise per Session: 0 min  Stress:  Not on file  Social Connections: Not on file   Vitals:   11/15/23 1008  BP: 110/80  Pulse: (!) 108  Resp: 16  Temp: 100.1 F (37.8 C)  SpO2: 95%   Body mass index is 32.77 kg/m.  Physical Exam Vitals and nursing note reviewed.  Constitutional:      General: She is not in acute distress.    Appearance: She is well-developed.  HENT:     Head: Normocephalic and atraumatic.     Mouth/Throat:     Mouth: Mucous membranes are moist.     Pharynx: Uvula midline. Postnasal drip present. No pharyngeal swelling.   Eyes:     Conjunctiva/sclera: Conjunctivae normal.    Cardiovascular:     Rate and Rhythm: Regular rhythm. Tachycardia present.     Heart sounds: No murmur heard. Pulmonary:     Effort: Pulmonary effort is normal. No respiratory distress.     Breath sounds: Wheezing present. No rhonchi or rales.     Comments: Frequent episodes of productive cough during visit. Abdominal:     Palpations: Abdomen is soft. There is no mass.     Tenderness: There is no abdominal tenderness.  Lymphadenopathy:     Cervical: No cervical adenopathy.   Skin:    General: Skin is warm.     Findings: No erythema or rash.   Neurological:     General: No focal deficit present.     Mental Status: She is alert and oriented to person, place, and time.     Gait: Gait normal.   Psychiatric:        Mood and Affect: Mood and affect normal.    ASSESSMENT AND PLAN:  Megan Wilkerson was seen today for hospital follow up.   Anaphylaxis due to food Assessment & Plan: Last episode trigger by smelling fish. Symptoms have resolved, except for cough. She has EpiPen  at home and Benadryl  25 mg. Continue avoiding exposure. Instructed about warning signs. Has appt with Dr Burdette Carolin on 12/08/23.  Psychogenic nonepileptic seizure Assessment & Plan: Has seen neuro and did not seem to be epilepsia. Evaluated by neuro in 03/2018. Brain MRI otherwise negative. EEG while having episodes  in 02/2023: This study is  within normal limits. No seizures or epileptiform discharges were seen throughout the recording.    Moderate persistent asthma without complication Assessment & Plan: Mild exacerbation. Prednisone  40 mg daily with breakfast x 5 d. Some side effects discussed. Resume Symbicort  80-4,5 mcg 2 puff bid. Albuterol  inh 2 puff every 6 hours for a week then as needed for wheezing or shortness of breath.  Instructed about warning signs.  Orders: -     predniSONE ; Take 2 tablets (40 mg total) by mouth daily with breakfast for 5 days.  Dispense: 10 tablet; Refill: 0  Acute cough Since hospital discharged. No rales on auscultation. Reports low grade fever last night. ? Viral URI. CXR ordered. Benzonatate  for cough management, plain Mucinex may also help. Instructed about warning signs.  -     DG Chest 2 View; Future -     Benzonatate ; Take 1 capsule (100 mg total) by mouth 2 (two) times daily as needed for up to 10 days.  Dispense: 20 capsule; Refill: 0  Return if symptoms worsen or fail to improve, for keep next appointment.  I, Fritz Jewel Wierda, acting as a scribe for Mavric Cortright [place], MD., have documented all relevant documentation on the behalf of Tarynn Garling [place], MD, as directed by  Kamyia Thomason [place], MD while in the presence of Seneca Gadbois [place], MD.   I, Lukah Goswami [place], MD, have reviewed all documentation for this visit. The documentation on 11/15/23 for the exam, diagnosis, procedures, and orders are all accurate and complete.  Jariah Jarmon G. [place], MD  [facility]. [place] office.

## 2023-11-15 NOTE — Assessment & Plan Note (Signed)
 Mild exacerbation. Prednisone  40 mg daily with breakfast x 5 d. Some side effects discussed. Resume Symbicort  80-4,5 mcg 2 puff bid. Albuterol  inh 2 puff every 6 hours for a week then as needed for wheezing or shortness of breath.  Instructed about warning signs.

## 2023-11-17 ENCOUNTER — Ambulatory Visit: Payer: Self-pay | Admitting: Family Medicine

## 2023-11-18 ENCOUNTER — Other Ambulatory Visit: Payer: Self-pay | Admitting: Family Medicine

## 2023-11-18 DIAGNOSIS — E1169 Type 2 diabetes mellitus with other specified complication: Secondary | ICD-10-CM

## 2023-12-08 ENCOUNTER — Ambulatory Visit (INDEPENDENT_AMBULATORY_CARE_PROVIDER_SITE_OTHER): Admitting: Allergy

## 2023-12-08 ENCOUNTER — Encounter: Payer: Self-pay | Admitting: Allergy

## 2023-12-08 ENCOUNTER — Other Ambulatory Visit: Payer: Self-pay

## 2023-12-08 ENCOUNTER — Other Ambulatory Visit: Payer: Self-pay | Admitting: Family Medicine

## 2023-12-08 VITALS — BP 120/90 | HR 101 | Temp 98.0°F | Resp 20 | Ht 65.0 in | Wt 204.7 lb

## 2023-12-08 DIAGNOSIS — L509 Urticaria, unspecified: Secondary | ICD-10-CM | POA: Diagnosis not present

## 2023-12-08 DIAGNOSIS — J4540 Moderate persistent asthma, uncomplicated: Secondary | ICD-10-CM | POA: Diagnosis not present

## 2023-12-08 DIAGNOSIS — J45998 Other asthma: Secondary | ICD-10-CM | POA: Diagnosis not present

## 2023-12-08 DIAGNOSIS — K219 Gastro-esophageal reflux disease without esophagitis: Secondary | ICD-10-CM | POA: Diagnosis not present

## 2023-12-08 DIAGNOSIS — T7800XD Anaphylactic reaction due to unspecified food, subsequent encounter: Secondary | ICD-10-CM | POA: Diagnosis not present

## 2023-12-08 DIAGNOSIS — J45909 Unspecified asthma, uncomplicated: Secondary | ICD-10-CM | POA: Diagnosis not present

## 2023-12-08 DIAGNOSIS — T7840XA Allergy, unspecified, initial encounter: Secondary | ICD-10-CM

## 2023-12-08 DIAGNOSIS — T7840XD Allergy, unspecified, subsequent encounter: Secondary | ICD-10-CM | POA: Diagnosis not present

## 2023-12-08 DIAGNOSIS — J3089 Other allergic rhinitis: Secondary | ICD-10-CM | POA: Diagnosis not present

## 2023-12-08 MED ORDER — FAMOTIDINE 20 MG PO TABS
20.0000 mg | ORAL_TABLET | Freq: Two times a day (BID) | ORAL | 2 refills | Status: DC
Start: 2023-12-08 — End: 2024-03-06

## 2023-12-08 MED ORDER — CETIRIZINE HCL 10 MG PO TABS: 10.0000 mg | ORAL_TABLET | Freq: Two times a day (BID) | ORAL | 2 refills | Status: DC

## 2023-12-08 NOTE — Patient Instructions (Addendum)
 Food allergies Start strict avoidance of seafood, peanuts, tree nuts, tomatoes, corn, jolly ranchers, sunflower, tomatoes, wheat,  paprika, milk. For mild symptoms you can take over the counter antihistamines (zyrtec  10mg  to 20mg ) and monitor symptoms closely.  If symptoms worsen or if you have severe symptoms including breathing issues, throat closure, significant swelling, whole body hives, severe diarrhea and vomiting, lightheadedness then use epinephrine  and seek immediate medical care afterwards. Emergency action plan given. Consider Xolair injections to prevent having a bad allergic reaction - handout given. This also helps with asthma and hives.   Asthma Daily controller medication(s): continue Symbicort  80mcg 2 puffs twice a day with spacer and rinse mouth afterwards. Spacer given and demonstrated proper use with inhaler. Patient understood technique and all questions/concerned were addressed.  May use albuterol  rescue inhaler 2 puffs or nebulizer every 4 to 6 hours as needed for shortness of breath, chest tightness, coughing, and wheezing. May use albuterol  rescue inhaler 2 puffs 5 to 15 minutes prior to strenuous physical activities. Monitor frequency of use - if you need to use it more than twice per week on a consistent basis let us  know.  Breathing control goals:  Full participation in all desired activities (may need albuterol  before activity) Albuterol  use two times or less a week on average (not counting use with activity) Cough interfering with sleep two times or less a month Oral steroids no more than once a year No hospitalizations   Hives: Start zyrtec  (cetirizine ) 10mg  twice a day. If symptoms are not controlled or causes drowsiness let us  know. Start Pepcid  (famotidine ) 20mg  twice a day.  Avoid the following potential triggers: alcohol, tight clothing, NSAIDs, hot showers and getting overheated. See below for proper skin care.   Get bloodwork. We are ordering labs,  so please allow 1-2 weeks for the results to come back. With the newly implemented Cures Act, the labs might be visible to you at the same time that they become visible to me. However, I will not address the results until all of the results are back, so please be patient.   Environmental allergies Get bloodwork and will make additional recommendations based on results. The zyrtec  as above should help with your symptoms.  Return in about 2 months (around 02/08/2024). Or sooner if needed.   Skin care recommendations  Bath time: Always use lukewarm water. AVOID very hot or cold water. Keep bathing time to 5-10 minutes. Do NOT use bubble bath. Use a mild soap and use just enough to wash the dirty areas. Do NOT scrub skin vigorously.  After bathing, pat dry your skin with a towel. Do NOT rub or scrub the skin.  Moisturizers and prescriptions:  ALWAYS apply moisturizers immediately after bathing (within 3 minutes). This helps to lock-in moisture. Use the moisturizer several times a day over the whole body. Good summer moisturizers include: Aveeno, CeraVe, Cetaphil. Good winter moisturizers include: Aquaphor, Vaseline, Cerave, Cetaphil, Eucerin, Vanicream. When using moisturizers along with medications, the moisturizer should be applied about one hour after applying the medication to prevent diluting effect of the medication or moisturize around where you applied the medications. When not using medications, the moisturizer can be continued twice daily as maintenance.  Laundry and clothing: Avoid laundry products with added color or perfumes. Use unscented hypo-allergenic laundry products such as Tide free, Cheer free & gentle, and All free and clear.  If the skin still seems dry or sensitive, you can try double-rinsing the clothes. Avoid tight or scratchy clothing  such as wool. Do not use fabric softeners or dyer sheets.

## 2023-12-08 NOTE — Progress Notes (Signed)
 New Patient Note  RE: Leshia Kope MRN: [id number] DOB: September 14, 1965 Date of Office Visit: 12/08/2023  Consult requested by: Juvenal Harlene PENNER, DO Primary care provider: [place], Betty G, MD  Chief Complaint: Allergic Reaction (Foods/Anaphylaxis x 1 month ago to fish and shelfish/), Asthma (No concerns), Urticaria, and Pruritus  History of Present Illness: I had the pleasure of seeing Cerenity Goshorn for initial evaluation at the [facility] on 12/08/2023. She is a 58 y.o. female, who is referred here by [place], Betty G, MD for the evaluation of food allergies. Failed to follow up as recommended.  Patient was last seen in our clinic in 2019 for VCD, asthma, allergic rhinitis, LPRD, food allergy .  Discussed the use of AI scribe software for clinical note transcription with the patient, who gave verbal consent to proceed.     She has a history of allergic reactions to various foods, including seafood, tomatoes, peanuts, corn, Jolly Ranchers, wheat, soy, sunflower seeds, paprika, and milk, with reactions ranging from anaphylaxis to hives, itching, and coughing. Her reactions have included anaphylaxis, shortness of breath, hives, swelling, itching, and coughing, depending on the trigger. She began experiencing allergic reactions in 2012 and has been managing her allergies with an EpiPen  and Benadryl . She last used her EpiPen  on November 09, 2023, following a severe reaction after seafood airborne inhalation. She was hospitalized for 24 hours in June 2025 after a reaction, and had an ICU stay in October 2024 after consuming tomatoes, which required three doses of epinephrine . She avoids a wide range of foods, including fish, shellfish, peanuts, popcorn, and certain candies, due to allergic reactions. She also avoids wheat, soy, and paprika, which cause hives, itching, and sometimes coughing.  She experiences respiratory symptoms such as shortness of breath and wheezing, particularly  with peanuts and corn. She uses Symbicort  and albuterol  for asthma management, but reports recent increased wheezing and coughing. She has a history of asthma and reports recent increased wheezing and coughing. She uses Symbicort  two puffs twice a day and albuterol  as needed - poor inhaler technique.  She reports a limited diet due to her allergies, generally avoiding dairy, nuts, sesame, and gluten, though she occasionally eats bread and dairy to test her reactions. She consumes fruits, and vegetables. She experiences intermittent hives and itching without clear triggers, and has not had recent allergy  testing.      She reports food allergy  to seafood, peanut  butter, corn, jolly ranchers, tomatoes, wheat, soy, paprika.   Seafood - airborne x 2. Trouble breathing. Since 2012.  Epipen  helps after she used it. Facial swelling.  Tomatoes - October 2024, throat swelling, ended up in the ICU. Used epipen  x3 and epi drip.  Peanuts - coughing, sob, not recently. [place] happened [place] [employer] - inhalation.  Corn - smell of the corn, coughing, hives. No epi.  Chrystie ranchers - eating, coughing, facial breakout, shortness of breath, epi x 1  Wheat - hives with ingestion. No epi.   Soy - facial itching, no epi, ingestion. Sunflower - itching, coughing, wheezing, no epi Paprika - epi x 1, coughing, hives, sob, loc Milk - facial rash/hives, wheezing, no epi.   She does have access to epinephrine  autoinjector.  Past work up includes: not recently. Dietary History: patient has been eating other foods including eggs, wheat, meats, fruits and vegetables.  11/25/2023 PCP visit: Patient was hospitalized from 6/12-6/13 for an allergic reaction. ED report states patient was with family at [facility]. She has  a history of allergic reaction to fish or the smell of fish. While they were sitting down, the waitress was delivering face to another family and when patient started smelling the fish, she  started coughing and feeling like her throat was swollen. She went to her car and had a seizure-like activity. Her son administered her EpiPen  and gave her Benadryl  before calling EMS. On arrival, she received additional EpiPen  x 2. Negative for oral edema, stridor,or dysphagia. + Mild dysphonia.  11/09/2023 ER visit: Alania Overholt is a 58 y.o. female with medical history significant for asthma, anaphylactic reaction, PNES/pseudoseizures, OSA, T2DM, allergic rhinitis, GERD and peripheral neuropathy who presents to the ED for evaluation after allergic reaction.  Per daughter, patient was with family at [employer].  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  While they were sitting down, the waitress was delivering face to another family and when patient started smelling the fish, she started coughing and feeling like her throat was swollen.  She went to her car and had a seizure-like activity.  Her son administered her EpiPen  and gave her Benadryl  before calling EMS.  On arrival, EMS gave patient additional EpiPen .  Patient reports having some nausea but no vomiting, rash or urinary incontinence during this episode.   ED Course: Initial vitals show patient afebrile, normotensive with SpO2 100% on room air. Initial labs significant for K+ 3.2, bicarb 21, glucose 188 with normal CBC and kidney function. Pt received IV Solu-Medrol  125 mg x 1 and IV Pepcid  20 mg x 1. She was monitored in the ED and TR[facility] was consulted for admission. On reassessment by EDP prior to admission, patient reported increased itching all over. She was found to have mild swelling to her chin and upper neck. She was given IV Benadryl  25 mg and right afterwards, she started feeling funny and had another episode of seizure-like activity that lasted a few minutes. She was injected with EpiPen  and received IV Ativan  2 mg x 1.  Patient slightly drowsy during evaluation for admission but oriented x 3 and answers  all questions appropriately. Reports she has had these episodes for more than 10 years with her last episode in October 2024 when she was hospitalized and had a brief ICU stay for anaphylactic reaction.  She was evaluated by neurology at that time who did not recommend any further workup or AED.   October 2024 hospitalization: Nora Sabey is a 58 year old female with past medical history of severe persistent asthma, multiple allergies with history of laxatives, history of pseudoseizures who presented to [facility] ED on 03/18/2023 with respiratory distress.  Reportedly she had a piece of pizza and started having scratchiness to her throat and difficulty breathing.  She was administered EpiPen  by her family members which initially helped her symptoms but symptoms returned and progressed.  EMS was activated on arrival patient was noted to be in respite distress with facial swelling and stridor, patient was given another EpiPen  with minimal improvement, repeated a third time and patient was started on epi drip and transferred to the ED for further evaluation.   In the ED, temperature 97.7 F, HR 110, RR 22, BP 122/90, SpO2 100% on 2 L nasal cannula.  WBC 9.4, hemoglobin 13.7, platelet count 256.  Sodium 144, potassium 2.6, chloride 109, CO2 21, glucose 171, BUN 8, creatinine 0.97.  AST 15, ALT 11, total bilirubin 0.3.  Chest x-ray with no active cardiopulmonary disease process.  Patient was  continued on epinephrine  drip, given 125 mg IV Solu-Medrol  and Benadryl .  Patient was [facility] to the [facility] service.         Component     Latest Ref Rng 03/19/2023  Tryptase     2.2 - 13.2 ug/L 5.2     Assessment and Plan: Charlie is a 58 y.o. 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 Recent episode required EpiPen . Most serious reactions to seafood fumes, tomato  ingestion. Last work up many years ago and she did have multiple positives. Normal tryptase in 2024. Sometimes she does experiment and tries to ingest her known food allergens to see if she is still allergic to it.  Given extensive list of foods triggering her symptoms, I questions if all her reactions were truly induced by foods or she has some other underlying issue. She does have anxiety and there are notes on VCD but no prior ENT evaluation for this. Start strict avoidance of seafood, peanuts, tree nuts, tomatoes, corn, jolly ranchers, sunflower, tomatoes, wheat,  paprika, milk. For mild symptoms you can take over the counter antihistamines (zyrtec  10mg  to 20mg ) and monitor symptoms closely.  If symptoms worsen or if you have severe symptoms including breathing issues, throat closure, significant swelling, whole body hives, severe diarrhea and vomiting, lightheadedness then use epinephrine  and seek immediate medical care afterwards. Emergency action plan given. Consider Xolair injections to prevent having a bad allergic reaction - handout given. This also helps with asthma and hives.  Get bloodwork.  Urticaria Sometime breakouts in hives with no known triggers. Start zyrtec  (cetirizine ) 10mg  twice a day. If symptoms are not controlled or causes drowsiness let us  know. Start Pepcid  (famotidine ) 20mg  twice a day.  Avoid the following potential triggers: alcohol, tight clothing, NSAIDs, hot showers and getting overheated. See below for proper skin care.   Other allergic rhinitis Get bloodwork and will make additional recommendations based on results. The zyrtec  as above should help with your symptoms.  Not well controlled moderate persistent asthma Increased wheezing and shortness of breath post-anaphylaxis. Today's spirometry showed normal pattern with 3% and 90cc improvement in FEV1 post bronchodilator treatment. Clinically feeling improved.  Patient had poor inhaler technique. Daily  controller medication(s): continue Symbicort  80mcg 2 puffs twice a day with spacer and rinse mouth afterwards. Spacer given and demonstrated proper use with inhaler. Patient understood technique and all questions/concerned were addressed.  May use albuterol  rescue inhaler 2 puffs or nebulizer every 4 to 6 hours as needed for shortness of breath, chest tightness, coughing, and wheezing. May use albuterol  rescue inhaler 2 puffs 5 to 15 minutes prior to strenuous physical activities. Monitor frequency of use - if you need to use it more than twice per week on a consistent basis let us  know.   GERD Continue lifestyle and dietary modifications. Continue PPI as before - nothing to eat or drink for 20-30 minutes afterwards.   Return in about 2 months (around 02/08/2024).  Meds ordered this encounter  Medications   famotidine  (PEPCID ) 20 MG tablet    Sig: Take 1 tablet (20 mg total) by mouth 2 (two) times daily.    Dispense:  60 tablet    Refill:  2   cetirizine  (ZYRTEC  ALLERGY ) 10 MG tablet    Sig: Take 1 tablet (10 mg total) by mouth 2 (two) times daily.    Dispense:  60 tablet    Refill:  2   Lab Orders         Allergens w/Total IgE Area 2         IgE Milk w/ Component Reflex         IgE Nut Prof. w/Component Rflx         Tryptase         Thyroid  Cascade Profile         ANA, IFA (with reflex)         Alpha-Gal Panel         Allergen Profile, Shellfish         Allergen Profile, Food-Fish         Allergen,Grn Pepper,Paprika,[id number]         Tomato IgE         Wheat IgE         Soybean IgE         Allergen Sunflower Seed [id number]         Allergen Gum Carageenan         Allergen, Red (Carmine) Dye, [id number]         ALLERGEN, ANNATTO SEED         Corn IgE         Other/Misc lab test      Other allergy  screening: Asthma: yes Currently on Symbicort  80mcg 2 puffs twice a day and using albuterol  as needed.  Wheezing daily now - that started.  Patient recently took prednisone .   Rhino  conjunctivitis: yes  Medication allergy : no Hymenoptera allergy : no Urticaria: yes Eczema:no History of recurrent infections suggestive of immunodeficency: no  Diagnostics: Spirometry:  Tracings reviewed. Her effort: It was hard to get consistent efforts and there is a question as to whether this reflects a maximal maneuver. FVC: 2.98L FEV1: 2.51L, 109% predicted FEV1/FVC ratio: 84% Interpretation: Spirometry consistent with normal pattern with 3% and 90cc improvement in FEV1 post bronchodilator treatment. Clinically feeling improved.   Please see scanned spirometry results for details.  Results discussed with patient/family.   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   Routine general medical examination at a health care facility 07/30/2015   Obesity, morbid (HCC) with BMI 37 and DM II,HLD 07/23/2014   Allergic rhinitis 10/25/2012   Obstructive sleep apnea 10/25/2012   Anaphylaxis due to food 03/25/2012   Allergic reaction 03/13/2012   Hypokalemia 02/18/2011   Asthma 01/12/2011   PERIPHERAL NEUROPATHY 01/14/2010   MAMMOGRAM, ABNORMAL, LEFT 04/22/2009   Hyperlipidemia 09/03/2008   ANEMIA-NOS 09/03/2008   GERD 09/03/2008   Past Medical History:  Diagnosis Date   Allergy  2012   NUMEROUS ALLERGIES AND ANAPHYLACTIC REACTIONS SINCE 2012   Anaphylactic reaction    several ED visits for same since 01/2012   Anemia    IDA    Asthma    Blood transfusion without reported diagnosis 1988   AFTER  CHILDBIRTH   GERD    Glaucoma    Hives of unknown origin    chronic and recurrent urticaria since 01/2012   HYPERLIPIDEMIA, WITH LOW HDL    MIGRAINE HEADACHE    PERIPHERAL NEUROPATHY    POSITIVE PPD 1987    s/p 6mo tx   Seizures (HCC)    Sleep apnea 2018   CPAP  Past Surgical History: Past Surgical History:  Procedure Laterality Date   ABDOMINAL HYSTERECTOMY  1996   APPENDECTOMY  1992   CESAREAN SECTION  1989   CHOLECYSTECTOMY  04/22/2011   Procedure: LAPAROSCOPIC CHOLECYSTECTOMY WITH INTRAOPERATIVE CHOLANGIOGRAM;  Surgeon: Jina Nephew, MD;  Location: [facility] OR;  Service: General;  Laterality: N/A;   CYST REMOVAL HAND Right 1997   Medication List:  Current Outpatient Medications  Medication Sig Dispense Refill   Accu-Chek Softclix Lancets lancets Use to test blood sugar daily. 100 each 12   brimonidine  (ALPHAGAN ) 0.15 % ophthalmic solution Place 1 drop into both eyes 2 (two) times daily.     budesonide -formoterol  (SYMBICORT ) 80-4.5 MCG/ACT inhaler Inhale 2 puffs into the lungs 2 (two) times daily. 10.2 g 3   cetirizine  (ZYRTEC  ALLERGY ) 10 MG tablet Take 1 tablet (10 mg total) by mouth 2 (two) times daily. 60 tablet 2   diphenhydrAMINE  (BENADRYL ) 25 MG tablet Take 1 tablet (25 mg total) by mouth every 6 (six) hours as needed. 30 tablet 0   EPINEPHrine  0.3 mg/0.3 mL IJ SOAJ injection Inject 0.3 mg into the muscle as needed for anaphylaxis. 2 each 0   famotidine  (PEPCID ) 20 MG tablet Take 1 tablet (20 mg total) by mouth 2 (two) times daily. 60 tablet 2   glucose blood (ACCU-CHEK GUIDE TEST) test strip Use to test blood sugar daily. 100 each 12   hydrOXYzine  (ATARAX ) 25 MG tablet Take 1 tablet (25 mg total) by mouth 3 (three) times daily as needed for anxiety. 90 tablet 0   latanoprost  (XALATAN ) 0.005 % ophthalmic solution Place 1 drop into both eyes at bedtime.      pantoprazole  (PROTONIX ) 40 MG tablet Take 1 tablet (40 mg total) by mouth daily. 90 tablet 0   potassium chloride  SA (KLOR-CON  M) 20 MEQ tablet Take 1 tablet (20 mEq total) by mouth daily. 30 tablet 1   rosuvastatin  (CRESTOR ) 10 MG tablet TAKE 1 TABLET BY MOUTH EVERY DAY 90 tablet 1   Semaglutide ,0.25 or 0.5MG /DOS, (OZEMPIC , 0.25  OR 0.5 MG/DOSE,) 2 MG/3ML SOPN INJECT 0.25 MG SUBCUTANEOUSLY WEEKLY X 4 WKS THEN 0.5 MG WEEKLY 3 mL 1   VENTOLIN  HFA 108 (90 Base) MCG/ACT inhaler TAKE 2 PUFFS BY MOUTH EVERY 6 HOURS AS NEEDED FOR WHEEZE OR SHORTNESS OF BREATH 18 each 2   No current facility-administered medications for this visit.   Allergies: Allergies  Allergen Reactions   Capsicum Annuum Extract & Derivative (Bell Pepper) [Capsicum] Anaphylaxis   Milk-Related Compounds Anaphylaxis   Other Anaphylaxis and Other (See Comments)    Chrystie rancher gummies   Peanuts [Peanut  Oil] Anaphylaxis and Itching   Shellfish Allergy  Anaphylaxis   Soy Allergy  (Obsolete) Other (See Comments) and Cough    Patient can't remember exact reaction    Tomato Anaphylaxis   Corn Dextrin Hives and Other (See Comments)    Plain corn ok, popcorn causes hives   Gluten Meal Other (See Comments) and Cough    Patient can't remember exact reaction    Morphine Itching and Rash   Sunflower Oil Itching and Other (See Comments)    Patient can't remember exact reaction    Wheat Nausea Only and Swelling    Patient can't remember exact reaction    Social History: Social History   Socioeconomic History   Marital status: Married    Spouse name: Iantha Titsworth   Number of children: Not on file   Years of education: Not on file   Highest education level:  Not on file  Occupational History   Not on file  Tobacco Use   Smoking status: Never    Passive exposure: Current   Smokeless tobacco: Never  Vaping Use   Vaping status: Never Used  Substance and Sexual Activity   Alcohol use: No   Drug use: No   Sexual activity: Not on file  Other Topics Concern   Not on file  Social History Narrative   Married, lives with spouse, son and nephew   Futures trader, 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   Social Drivers of Corporate investment banker Strain: Not on file  Food Insecurity: No Food Insecurity  (11/09/2023)   Hunger Vital Sign    Worried About Running Out of Food in the Last Year: Never true    Ran Out of Food in the Last Year: Never true  Transportation Needs: No Transportation Needs (11/09/2023)   PRAPARE - Administrator, Civil Service (Medical): No    Lack of Transportation (Non-Medical): No  Physical Activity: Inactive (04/24/2018)   Exercise Vital Sign    Days of Exercise per Week: 0 days    Minutes of Exercise per Session: 0 min  Stress: Not on file  Social Connections: Not on file   Lives in a house. Smoking: denies Occupation: not employed  Environmental HistorySurveyor, minerals in the house: no Engineer, civil (consulting) in the family room: no Carpet in the bedroom: no Heating: electric Cooling: central Pet: no  Family History: Family History  Problem Relation Age of Onset   Diabetes Mother    Prostate cancer Father    Hypertension Father    Heart disease Father    Seizures Sister    Diabetes Brother    Alcohol abuse Other    Arthritis Other    Hypertension Other    Colon cancer Neg Hx    Colon polyps Neg Hx    Esophageal cancer Neg Hx    Rectal cancer Neg Hx    Stomach cancer Neg Hx    Problem                               Relation Asthma                                   son  Review of Systems  Constitutional:  Negative for appetite change, chills, fever and unexpected weight change.  HENT:  Negative for congestion and rhinorrhea.   Eyes:  Negative for itching.  Respiratory:  Positive for cough, chest tightness, shortness of breath and wheezing.   Cardiovascular:  Negative for chest pain.  Gastrointestinal:  Negative for abdominal pain.  Genitourinary:  Negative for difficulty urinating.  Skin:  Positive for rash.  Allergic/Immunologic: Positive for environmental allergies and food allergies.  Neurological:  Negative for headaches.    Objective: BP (!) 120/90   Pulse (!) 101   Temp 98 F (36.7 C)   Resp 20   Ht 5' 5 (1.651 m)   Wt  204 lb 11.2 oz (92.9 kg)   SpO2 98%   BMI 34.06 kg/m  Body mass index is 34.06 kg/m. Physical Exam Vitals and nursing note reviewed.  Constitutional:      Appearance: She is well-developed.  HENT:     Head: Normocephalic and atraumatic.  Right Ear: Tympanic membrane and external ear normal.     Left Ear: Tympanic membrane and external ear normal.     Nose: Nose normal.     Mouth/Throat:     Mouth: Mucous membranes are moist.     Pharynx: Oropharynx is clear.  Eyes:     Conjunctiva/sclera: Conjunctivae normal.  Cardiovascular:     Rate and Rhythm: Normal rate and regular rhythm.     Heart sounds: Normal heart sounds. No murmur heard.    No friction rub. No gallop.  Pulmonary:     Effort: Pulmonary effort is normal.     Breath sounds: Normal breath sounds. No wheezing, rhonchi or rales.  Musculoskeletal:     Cervical back: Neck supple.  Skin:    General: Skin is warm.     Findings: No rash.  Neurological:     Mental Status: She is alert and oriented to person, place, and time.    The plan was reviewed with the patient/family, and all questions/concerned were addressed.  It was my pleasure to see Alexy today and participate in her care. Please feel free to contact me with any questions or concerns.  Sincerely,  Orlan Cramp, DO Allergy  & Immunology  [facility]  [facility] office: [phone] [facility] office: [phone]  Total Time: 65 minutes Time spent on day of service preparing to see patient; obtaining and reviewing separately obtained history;  performing examination; counseling and educating patient and family; ordering medications, tests and procedures; documenting clinical information in the health record; independently interpreting results and communicating to patient/family.

## 2023-12-15 ENCOUNTER — Ambulatory Visit: Admitting: Family Medicine

## 2023-12-15 ENCOUNTER — Encounter: Payer: Self-pay | Admitting: Family Medicine

## 2023-12-15 VITALS — BP 128/80 | HR 86 | Temp 98.5°F | Resp 16 | Ht 65.0 in | Wt 201.4 lb

## 2023-12-15 DIAGNOSIS — J4540 Moderate persistent asthma, uncomplicated: Secondary | ICD-10-CM | POA: Diagnosis not present

## 2023-12-15 DIAGNOSIS — E785 Hyperlipidemia, unspecified: Secondary | ICD-10-CM

## 2023-12-15 DIAGNOSIS — Z23 Encounter for immunization: Secondary | ICD-10-CM

## 2023-12-15 DIAGNOSIS — E876 Hypokalemia: Secondary | ICD-10-CM

## 2023-12-15 DIAGNOSIS — R200 Anesthesia of skin: Secondary | ICD-10-CM | POA: Diagnosis not present

## 2023-12-15 DIAGNOSIS — R202 Paresthesia of skin: Secondary | ICD-10-CM | POA: Diagnosis not present

## 2023-12-15 DIAGNOSIS — E1169 Type 2 diabetes mellitus with other specified complication: Secondary | ICD-10-CM | POA: Diagnosis not present

## 2023-12-15 DIAGNOSIS — S6991XA Unspecified injury of right wrist, hand and finger(s), initial encounter: Secondary | ICD-10-CM | POA: Diagnosis not present

## 2023-12-15 MED ORDER — OZEMPIC (0.25 OR 0.5 MG/DOSE) 2 MG/3ML ~~LOC~~ SOPN: 0.5000 mg | PEN_INJECTOR | SUBCUTANEOUS | 1 refills | Status: DC

## 2023-12-15 MED ORDER — MUPIROCIN 2 % EX OINT: 1.0000 | TOPICAL_OINTMENT | Freq: Two times a day (BID) | CUTANEOUS | 0 refills | Status: AC

## 2023-12-15 MED ORDER — ROSUVASTATIN CALCIUM 5 MG PO TABS: 5.0000 mg | ORAL_TABLET | Freq: Every day | ORAL | 3 refills | Status: AC

## 2023-12-15 NOTE — Assessment & Plan Note (Addendum)
 HgA1C at goal on 11/10/23, 5.5. Reporting some elevated BS, above 160.  Recommend increasing dose of semaglutide  from 0.25 mg weekly to 0.5 mg weekly. Regular exercise and healthy diet with avoidance of added sugar food intake is an important part of treatment and recommended. Annual eye exam, periodic dental and foot care recommended. F/U in 4 months.

## 2023-12-15 NOTE — Assessment & Plan Note (Addendum)
 Currently she is not taking rosuvastatin  10 mg. Last LDL 49 in 07/2023. Resume rosuvastatin  at lower dose, 5 mg daily. Continue low-fat diet. We will plan on fasting lipid panel next visit.

## 2023-12-15 NOTE — Assessment & Plan Note (Signed)
 Problem is well-controlled, she has not had symptoms since resuming Symbicort  80-4.5 mcg twice daily. No changes today. Following with immunologist.

## 2023-12-15 NOTE — Progress Notes (Signed)
 HPI: [name] is a 58 y.o. female with a PMHx significant for asthma, HLD, DM II, OSA, GERD, pseudoseizures, seafood allergies, and peripheral neuropathy, who is here today to follow on recent OV visit.  Since her last visit she was seen by her immunologist/allergist, Dr. Luke, on 12/08/23.   Diabetes Mellitus II: dx'ed 04/05/23 with HgA1C 6.7 -Checking BG at home once daily; reports a reading of 105 yesterday. Highest reading was in the 180s.  - Medications: Semaglutide  0.25 mg once weekly with good compliance and tolerance.  - Exercise: Walking regularly with her sister and stays active throughout the day.  - Diet: She has reduced her sugar intake since 02/2023.  - Eye exam: She is due for her 2nd follow up with her eye doctor; follows up 2x/year.  - Foot exam: 07/2023  - She endorses numbness in her feet/hands when standing for long periods of time. This is new for her; started a couple of weeks ago.  - Negative for symptoms of hypoglycemia, polyuria, polydipsia, or foot ulcers.  Lab Results  Component Value Date   HGBA1C 5.5 11/09/2023   Lab Results  Component Value Date   MICROALBUR 1.8 07/07/2023   Hypokalemia: Currently she is on K-Lor 20 mK daily. Lab Results  Component Value Date   NA 139 11/10/2023   CL 109 11/10/2023   K 4.0 11/10/2023   CO2 19 (L) 11/10/2023   BUN 10 11/10/2023   CREATININE 0.78 11/10/2023   GFRNONAA >60 11/10/2023   CALCIUM  9.5 11/10/2023   PHOS 3.4 11/09/2023   ALBUMIN 4.2 11/09/2023   GLUCOSE 111 (H) 11/10/2023   Hyperlipidemia: Has been working on [employer] and regular exercise.  She was previously taking Rosuvastatin  10 mg once daily, but recently ran out.   Lab Results  Component Value Date   CHOL 109 07/07/2023   HDL 42.70 07/07/2023   LDLCALC 49 07/07/2023   LDLDIRECT 121.0 07/23/2014   TRIG 87.0 07/07/2023   CHOLHDL 3 07/07/2023   Asthma: She was last seen on 6/18 for a hospital follow up for anaphylaxis as well  as an acute cough/asthma.  She has been using her Symbicort  2 puffs BID and albuterol  inhaler prn.  Her cough has fully resolved with her current regimen.   She recently cut her right index finger while cleaning her refrigerator 2 weeks ago. She states she has not peeled off any skin around the effected area. Some pain on affected area, no erythema or drainage.  Review of Systems  Constitutional:  Negative for activity change, appetite change and fever.  HENT:  Negative for sore throat.   Respiratory:  Negative for cough and wheezing.   Cardiovascular:  Negative for chest pain, palpitations and leg swelling.  Gastrointestinal:  Negative for abdominal pain, nausea and vomiting.  Genitourinary:  Negative for decreased urine volume, dysuria and hematuria.  Skin:  Negative for rash.  Neurological:  Negative for syncope, weakness and headaches.  See other pertinent positives and negatives in HPI.  Current Outpatient Medications on File Prior to Visit  Medication Sig Dispense Refill   Accu-Chek Softclix Lancets lancets Use to test blood sugar daily. 100 each 12   brimonidine  (ALPHAGAN ) 0.15 % ophthalmic solution Place 1 drop into both eyes 2 (two) times daily.     budesonide -formoterol  (SYMBICORT ) 80-4.5 MCG/ACT inhaler Inhale 2 puffs into the lungs 2 (two) times daily. 10.2 g 3   cetirizine  (ZYRTEC  ALLERGY ) 10 MG tablet Take 1 tablet (10 mg total) by mouth  2 (two) times daily. 60 tablet 2   diphenhydrAMINE  (BENADRYL ) 25 MG tablet Take 1 tablet (25 mg total) by mouth every 6 (six) hours as needed. 30 tablet 0   EPINEPHrine  0.3 mg/0.3 mL IJ SOAJ injection Inject 0.3 mg into the muscle as needed for anaphylaxis. 2 each 0   famotidine  (PEPCID ) 20 MG tablet Take 1 tablet (20 mg total) by mouth 2 (two) times daily. 60 tablet 2   glucose blood (ACCU-CHEK GUIDE TEST) test strip Use to test blood sugar daily. 100 each 12   hydrOXYzine  (ATARAX ) 25 MG tablet Take 1 tablet (25 mg total) by mouth 3 (three)  times daily as needed for anxiety. 90 tablet 0   latanoprost  (XALATAN ) 0.005 % ophthalmic solution Place 1 drop into both eyes at bedtime.      pantoprazole  (PROTONIX ) 40 MG tablet Take 1 tablet (40 mg total) by mouth daily. 90 tablet 0   potassium chloride  SA (KLOR-CON  M) 20 MEQ tablet Take 1 tablet (20 mEq total) by mouth daily. 30 tablet 1   VENTOLIN  HFA 108 (90 Base) MCG/ACT inhaler TAKE 2 PUFFS BY MOUTH EVERY 6 HOURS AS NEEDED FOR WHEEZE OR SHORTNESS OF BREATH 18 each 2   No current facility-administered medications on file prior to visit.    Past Medical History:  Diagnosis Date   Allergy  2012   NUMEROUS ALLERGIES AND ANAPHYLACTIC REACTIONS SINCE 2012   Anaphylactic reaction    several ED visits for same since 01/2012   Anemia    IDA    Asthma    Blood transfusion without reported diagnosis 1988   AFTER  CHILDBIRTH   GERD    Glaucoma    Hives of unknown origin    chronic and recurrent urticaria since 01/2012   HYPERLIPIDEMIA, WITH LOW HDL    MIGRAINE HEADACHE    PERIPHERAL NEUROPATHY    POSITIVE PPD 1987   s/p 47mo tx   Seizures (HCC)    Sleep apnea 2018   CPAP   Allergies  Allergen Reactions   Capsicum Annuum Extract & Derivative (Bell Pepper) [Capsicum] Anaphylaxis   Milk-Related Compounds Anaphylaxis   Other Anaphylaxis and Other (See Comments)    Chrystie rancher gummies   Peanuts [Peanut  Oil] Anaphylaxis and Itching   Shellfish Allergy  Anaphylaxis   Soy Allergy  (Obsolete) Other (See Comments) and Cough    Patient can't remember exact reaction    Tomato Anaphylaxis   Corn Dextrin Hives and Other (See Comments)    Plain corn ok, popcorn causes hives   Gluten Meal Other (See Comments) and Cough    Patient can't remember exact reaction    Morphine Itching and Rash   Sunflower Oil Itching and Other (See Comments)    Patient can't remember exact reaction    Wheat Nausea Only and Swelling    Patient can't remember exact reaction     Social History    Socioeconomic History   Marital status: Married    Spouse name: Mellody Masri   Number of children: Not on file   Years of education: Not on file   Highest education level: Not on file  Occupational History   Not on file  Tobacco Use   Smoking status: Never    Passive exposure: Current   Smokeless tobacco: Never  Vaping Use   Vaping status: Never Used  Substance and Sexual Activity   Alcohol use: No   Drug use: No   Sexual activity: Not on file  Other Topics Concern  Not on file  Social History Narrative   Married, lives with spouse, son and nephew   Homemaker, 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   Social Drivers of Corporate investment banker Strain: Not on file  Food Insecurity: No Food Insecurity (11/09/2023)   Hunger Vital Sign    Worried About Running Out of Food in the Last Year: Never true    Ran Out of Food in the Last Year: Never true  Transportation Needs: No Transportation Needs (11/09/2023)   PRAPARE - Administrator, Civil Service (Medical): No    Lack of Transportation (Non-Medical): No  Physical Activity: Inactive (04/24/2018)   Exercise Vital Sign    Days of Exercise per Week: 0 days    Minutes of Exercise per Session: 0 min  Stress: Not on file  Social Connections: Not on file   Vitals:   12/15/23 1025  BP: 128/80  Pulse: 86  Resp: 16  Temp: 98.5 F (36.9 C)  SpO2: 95%   Body mass index is 33.51 kg/m.  Physical Exam Vitals and nursing note reviewed.  Constitutional:      General: She is not in acute distress.    Appearance: She is well-developed.  HENT:     Head: Normocephalic and atraumatic.     Mouth/Throat:     Mouth: Mucous membranes are moist.     Pharynx: Oropharynx is clear.  Eyes:     Conjunctiva/sclera: Conjunctivae normal.  Cardiovascular:     Rate and Rhythm: Normal rate and regular rhythm.     Pulses:          Dorsalis pedis pulses are 2+ on the right  side and 2+ on the left side.     Heart sounds: No murmur heard. Pulmonary:     Effort: Pulmonary effort is normal. No respiratory distress.     Breath sounds: Normal breath sounds.  Abdominal:     Palpations: Abdomen is soft. There is no hepatomegaly or mass.     Tenderness: There is no abdominal tenderness.  Musculoskeletal:       Hands:  Lymphadenopathy:     Cervical: No cervical adenopathy.  Skin:    General: Skin is warm.     Findings: No erythema or rash.     Comments: See hand in musc.  Neurological:     General: No focal deficit present.     Mental Status: She is alert and oriented to person, place, and time.     Cranial Nerves: No cranial nerve deficit.     Gait: Gait normal.  Psychiatric:        Mood and Affect: Mood and affect normal.   ASSESSMENT AND PLAN: [name] was seen today for a follow up .  Orders Placed This Encounter  Procedures   Varicella-zoster vaccine IM   Type 2 diabetes mellitus with other specified complication, without long-term current use of insulin  [facility]) Assessment & Plan: HgA1C at goal on 11/10/23, 5.5. Reporting some elevated BS, above 160.  Recommend increasing dose of semaglutide  from 0.25 mg weekly to 0.5 mg weekly. Regular exercise and healthy diet with avoidance of added sugar food intake is an important part of treatment and recommended. Annual eye exam, periodic dental and foot care recommended. F/U in 4 months.  Orders: -     Ozempic  (0.25 or 0.5 MG/DOSE); Inject 0.5 mg into the skin once a week. INJECT 0.25 MG SUBCUTANEOUSLY  WEEKLY X 4 WKS THEN 0.5 MG WEEKLY  Dispense: 3 mL; Refill: 1  Moderate persistent asthma without complication Assessment & Plan: Problem is well-controlled, she has not had symptoms since resuming Symbicort  80-4.5 mcg twice daily. No changes today. Following with immunologist.  Injury of right thumb, initial encounter No signs of infection. Tdap up-to-date. Recommend topical mupirocin  twice  daily for 7 days. Monitor for signs of complication.  -     Mupirocin ; Apply 1 Application topically 2 (two) times daily for 10 days. Right thumb  Dispense: 20 g; Refill: 0  Hypokalemia Assessment & Plan: Potassium was 4.0 in 10/2023. Continue K-Lor 20 mK daily.  Numbness and tingling of both feet We discussed possible etiologies. No associated weakness or other symptoms that may suggest a more serious process. ?  Peripheral neuropathy. Continue appropriate footcare. We will add B12 to next blood work. Instructed about warning signs.  Hyperlipidemia, unspecified hyperlipidemia type Assessment & Plan: Currently she is not taking rosuvastatin  10 mg. Last LDL 49 in 07/2023. Resume rosuvastatin  at lower dose, 5 mg daily. Continue low-fat diet. We will plan on fasting lipid panel next visit.  Orders: -     Rosuvastatin  Calcium ; Take 1 tablet (5 mg total) by mouth daily.  Dispense: 90 tablet; Refill: 3  Need for shingles vaccine -     Varicella-zoster vaccine IM  Return in about 18 weeks (around 04/19/2024) for chronic problems, Labs.   I, Megan Wilkerson, acting as a scribe for Sarath Privott [place], MD., have documented all relevant documentation on the behalf of Megan Kading [place], MD, as directed by   while in the presence of Megan Styron [place], MD.  I, Megan Malicki [place], MD, have reviewed all documentation for this visit. The documentation on 12/15/23 for the exam, diagnosis, procedures, and orders are all accurate and complete.  Megan Atayde G. [place], MD  [facility]. [place] office.

## 2023-12-15 NOTE — Patient Instructions (Addendum)
 A few things to remember from today's visit:  Moderate persistent asthma without complication  Type 2 diabetes mellitus with other specified complication, without long-term current use of insulin  (HCC) - Plan: Semaglutide ,0.25 or 0.5MG /DOS, (OZEMPIC , 0.25 OR 0.5 MG/DOSE,) 2 MG/3ML SOPN  Hypokalemia  Numbness and tingling of both feet  Hyperlipidemia, unspecified hyperlipidemia type - Plan: rosuvastatin  (CRESTOR ) 5 MG tablet Monitor for new symptoms associated with numbness, we can check B12 next visit. Increase Semaglutide  to 0.5 mg weekly. Resume Rosuvastatin  at 5 mg daily. Fasting labs next visit.  If you need refills for medications you take chronically, please call your pharmacy. Do not use My Chart to request refills or for acute issues that need immediate attention. If you send a my chart message, it may take a few days to be addressed, specially if I am not in the office.  Please be sure medication list is accurate. If a new problem present, please set up appointment sooner than planned today.

## 2023-12-15 NOTE — Assessment & Plan Note (Signed)
 Potassium was 4.0 in 10/2023. Continue K-Lor 20 mK daily.

## 2023-12-22 LAB — ALPHA-GAL PANEL
Allergen Lamb IgE: <0.1 kU/L
Beef IgE: <0.1 kU/L
IgE (Immunoglobulin E), Serum: 102 [IU]/mL (ref 6–495)
O215-IgE Alpha-Gal: <0.1 kU/L
Pork IgE: <0.1 kU/L

## 2023-12-22 LAB — IGE MILK W/ COMPONENT REFLEX: F002-IgE Milk: <0.1 kU/L

## 2023-12-22 LAB — ALLERGENS W/TOTAL IGE AREA 2
Alternaria Alternata IgE: <0.1 kU/L
Aspergillus Fumigatus IgE: <0.1 kU/L
Bermuda Grass IgE: <0.1 kU/L
Cat Dander IgE: <0.1 kU/L
Cedar, Mountain IgE: <0.1 kU/L
Cladosporium Herbarum IgE: <0.1 kU/L
Cockroach, German IgE: 7.39 kU/L — AB
Common Silver Birch IgE: <0.1 kU/L
Cottonwood IgE: <0.1 kU/L
D Farinae IgE: 0.35 kU/L — AB
D Pteronyssinus IgE: 0.35 kU/L — AB
Dog Dander IgE: <0.1 kU/L
Elm, American IgE: <0.1 kU/L
Johnson Grass IgE: <0.1 kU/L
Maple/Box Elder IgE: <0.1 kU/L
Mouse Urine IgE: <0.1 kU/L
Oak, White IgE: <0.1 kU/L
Pecan, Hickory IgE: <0.1 kU/L
Penicillium Chrysogen IgE: <0.1 kU/L
Pigweed, Rough IgE: <0.1 kU/L
Ragweed, Short IgE: <0.1 kU/L
Sheep Sorrel IgE Qn: <0.1 kU/L
Timothy Grass IgE: 0.14 kU/L — AB
White Mulberry IgE: <0.1 kU/L

## 2023-12-22 LAB — ALLERGEN PROFILE, SHELLFISH
Clam IgE: <0.1 kU/L
F023-IgE Crab: 0.35 kU/L — AB
F080-IgE Lobster: 0.23 kU/L — AB
F290-IgE Oyster: 0.1 kU/L — AB
Scallop IgE: <0.1 kU/L
Shrimp IgE: 0.65 kU/L — AB

## 2023-12-22 LAB — ALLERGEN PROFILE, FOOD-FISH
Allergen Mackerel IgE: <0.1 kU/L
Allergen Salmon IgE: <0.1 kU/L
Allergen Trout IgE: <0.1 kU/L
Allergen Walley Pike IgE: <0.1 kU/L
Codfish IgE: <0.1 kU/L
Halibut IgE: <0.1 kU/L
Tuna: <0.1 kU/L

## 2023-12-22 LAB — THYROID CASCADE PROFILE: TSH: 0.967 u[IU]/mL (ref 0.450–4.500)

## 2023-12-22 LAB — IGE NUT PROF. W/COMPONENT RFLX
F017-IgE Hazelnut (Filbert): <0.1 kU/L
F018-IgE Brazil Nut: <0.1 kU/L
F020-IgE Almond: <0.1 kU/L
F202-IgE Cashew Nut: <0.1 kU/L
F203-IgE Pistachio Nut: <0.1 kU/L
F256-IgE Walnut: <0.1 kU/L
Macadamia Nut, IgE: <0.1 kU/L
Peanut, IgE: <0.1 kU/L
Pecan Nut IgE: <0.1 kU/L

## 2023-12-22 LAB — ALLERGEN, RED (CARMINE) DYE, RF340: F340-IgE Carmine Red Dye: 0.26 kU/L — AB

## 2023-12-22 LAB — ALLERGEN,GRN PEPPER,PAPRIKA,F218: Paprika IgE: <0.1 kU/L

## 2023-12-22 LAB — ALLERGEN SUNFLOWER SEED K84: Sunflower Seed k84: <0.1 kU/L

## 2023-12-22 LAB — ALLERGEN, WHEAT, F4: Wheat IgE: <0.1 kU/L

## 2023-12-22 LAB — ALLERGEN, TOMATO F25: Allergen Tomato, IgE: <0.1 kU/L

## 2023-12-22 LAB — ALLERGEN SOYBEAN: Soybean IgE: <0.1 kU/L

## 2023-12-22 LAB — ANTINUCLEAR ANTIBODIES, IFA: ANA Titer 1: NEGATIVE

## 2023-12-22 LAB — TRYPTASE: Tryptase: <1 ug/L — ABNORMAL LOW (ref 2.2–13.2)

## 2023-12-22 LAB — CHRONIC URTICARIA PD-BAT: Pooled Donor- BAT CU: 4.37 (ref 0.00–10.60)

## 2023-12-22 LAB — ALLERGEN, CORN F8: Allergen Corn, IgE: <0.1 kU/L

## 2023-12-22 LAB — ALLERGEN, ANNATTO SEED
Annatto Seed IgE*: <0.35 kU/L (ref <0.35)
Class Interpretation: 0

## 2024-01-09 ENCOUNTER — Ambulatory Visit: Payer: Self-pay | Admitting: Allergy

## 2024-01-24 ENCOUNTER — Other Ambulatory Visit (HOSPITAL_COMMUNITY): Payer: Self-pay

## 2024-02-12 ENCOUNTER — Ambulatory Visit (INDEPENDENT_AMBULATORY_CARE_PROVIDER_SITE_OTHER): Admitting: Allergy

## 2024-02-12 ENCOUNTER — Encounter: Payer: Self-pay | Admitting: Allergy

## 2024-02-12 VITALS — BP 128/80 | HR 98 | Temp 97.8°F | Resp 18 | Ht 65.0 in | Wt 198.5 lb

## 2024-02-12 DIAGNOSIS — T7800XD Anaphylactic reaction due to unspecified food, subsequent encounter: Secondary | ICD-10-CM | POA: Diagnosis not present

## 2024-02-12 DIAGNOSIS — K219 Gastro-esophageal reflux disease without esophagitis: Secondary | ICD-10-CM | POA: Diagnosis not present

## 2024-02-12 DIAGNOSIS — J4540 Moderate persistent asthma, uncomplicated: Secondary | ICD-10-CM

## 2024-02-12 DIAGNOSIS — J3089 Other allergic rhinitis: Secondary | ICD-10-CM

## 2024-02-12 DIAGNOSIS — T7840XD Allergy, unspecified, subsequent encounter: Secondary | ICD-10-CM

## 2024-02-12 NOTE — Progress Notes (Signed)
 Follow Up Note  RE: Megan Wilkerson MRN: [id number] DOB: 25-Jun-1965 Date of Office Visit: 02/12/2024  Referring provider: [place], Betty G, MD Primary care provider: [place], Betty G, MD  Chief Complaint: Follow-up (Patient presents to the office for a follow up. No concerns or questions today.)  History of Present Illness: I had the pleasure of seeing Megan Wilkerson for a follow up visit at the [facility] on 02/12/2024. She is a 58 y.o. female, who is being followed for allergic reactions, food allergy , urticaria, allergic rhinitis, asthma, GERD. Her previous allergy  office visit was on 12/08/2023 with Dr. Luke. Today is a regular follow up visit.  Discussed the use of AI scribe software for clinical note transcription with the patient, who gave verbal consent to proceed.    She has a history of severe allergic reactions, including anaphylaxis requiring ICU admission. She is currently avoiding shellfish, peanuts, tree nuts, tomatoes, corn, Jolly Ranchers, sunflower, wheat, paprika, and milk. There have been no recent allergic reactions since her last visit a couple of months ago. She continues to carry an EpiPen  for emergencies.  She experiences ongoing issues with hives, describing them as itchy and persistent, particularly on her back and face. The hives 'leave and then come back,' and are not alleviated by topical creams. She has been using Zyrtec  and Pepcid  twice a day, but these medications have not been effective in reducing her symptoms.  She reports using Symbicort , two puffs twice a day, and has not needed to use her rescue inhaler recently. She reports her asthma has been good and she has not needed her rescue inhaler.      2025 labs: Thyroid , autoimmune screener, chronic urticaria index (checks for autoantibodies that trigger mast cells), tryptase (checks for mast cell issues) and alpha gal (checks for red meat allergy ) were all normal which is great.    Food   Positive to shrimp, borderline to crab, lobster, oyster, carmine red dye.  Negative to milk, peanuts, tree nuts, finned fish panel, paprika, tomato, wheat, soy, sunflower seed, annatto seed, corn.    Continue to avoid seafood, peanuts, tree nuts, tomatoes, corn, jolly ranchers, sunflower, tomatoes, wheat, paprika, milk.  Recommend skin testing next to some of the above foods to see if meets criteria for food challenges.  No change in diet for now.    Environmental panel was positive to cockroach. Borderline to dust mites and grass. See below for environmental control measures.  Assessment and Plan: [name] is a 58 y.o. 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 Recent episode required EpiPen . Most serious reactions to seafood fumes, tomato ingestion. Last work up many years ago and she did have multiple positives. Normal tryptase in 2024. Sometimes she does experiment and tries to ingest her known food allergens to see if she is still allergic to it.  Interim history - no reactions. Not interested in food reintroduction. 2025 labs positive to shrimp, borderline to crab, lobster, oyster, carmine red dye.  Negative to milk, peanuts, tree nuts, finned fish panel, paprika, tomato, wheat, soy, sunflower seed, annatto seed, corn. Continue strict avoidance of seafood, peanuts, tree nuts, tomatoes, corn, jolly ranchers, sunflower, tomatoes, wheat, paprika, milk. Okay to eat red dye as before.  For mild symptoms you can take over the counter antihistamines (zyrtec  10mg  to 20mg ) and monitor symptoms closely.  If symptoms worsen or if you  have severe symptoms including breathing issues, throat closure, significant swelling, whole body hives, severe diarrhea and vomiting, lightheadedness then use epinephrine  and seek immediate medical care afterwards. Emergency  action plan in place.  Start Xolair 300mg  every 4 week injections to prevent having a bad allergic reaction. Consent signed.    Urticaria Past history - Sometime breakouts in hives with no known triggers. Interim history - still has some breakouts. Increase zyrtec  (cetirizine ) to 20mg  twice a day. If symptoms are not controlled or causes drowsiness let us  know. Continue Pepcid  (famotidine ) 20mg  twice a day.  Avoid the following potential triggers: alcohol, tight clothing, NSAIDs, hot showers and getting overheated. Continue proper skin care.  Start Xolair as above as well.    Other allergic rhinitis Past history - 2025 labs positive to cockroach. Borderline to dust mites and grass. The zyrtec  as above should help with your symptoms.   Moderate persistent asthma Past history - increased wheezing and shortness of breath post-anaphylaxis. 2025 spirometry showed normal pattern with 3% and 90cc improvement in FEV1 post bronchodilator treatment. Clinically feeling improved.  Daily controller medication(s): continue Symbicort  80mcg 2 puffs twice a day with spacer and rinse mouth afterwards. May use albuterol  rescue inhaler 2 puffs or nebulizer every 4 to 6 hours as needed for shortness of breath, chest tightness, coughing, and wheezing. May use albuterol  rescue inhaler 2 puffs 5 to 15 minutes prior to strenuous physical activities. Monitor frequency of use - if you need to use it more than twice per week on a consistent basis let us  know.    GERD Continue lifestyle and dietary modifications. Continue PPI as before - nothing to eat or drink for 20-30 minutes afterwards.   Return in about 3 months (around 05/13/2024).  No orders of the defined types were placed in this encounter.  Lab Orders  No laboratory test(s) ordered today    Diagnostics: None.   Medication List:  Current Outpatient Medications  Medication Sig Dispense Refill   Accu-Chek Softclix Lancets lancets Use to test  blood sugar daily. 100 each 12   brimonidine  (ALPHAGAN ) 0.15 % ophthalmic solution Place 1 drop into both eyes 2 (two) times daily.     budesonide -formoterol  (SYMBICORT ) 80-4.5 MCG/ACT inhaler Inhale 2 puffs into the lungs 2 (two) times daily. 10.2 g 3   cetirizine  (ZYRTEC  ALLERGY ) 10 MG tablet Take 1 tablet (10 mg total) by mouth 2 (two) times daily. 60 tablet 2   diphenhydrAMINE  (BENADRYL ) 25 MG tablet Take 1 tablet (25 mg total) by mouth every 6 (six) hours as needed. 30 tablet 0   EPINEPHrine  0.3 mg/0.3 mL IJ SOAJ injection Inject 0.3 mg into the muscle as needed for anaphylaxis. 2 each 0   famotidine  (PEPCID ) 20 MG tablet Take 1 tablet (20 mg total) by mouth 2 (two) times daily. 60 tablet 2   glucose blood (ACCU-CHEK GUIDE TEST) test strip Use to test blood sugar daily. 100 each 12   hydrOXYzine  (ATARAX ) 25 MG tablet Take 1 tablet (25 mg total) by mouth 3 (three) times daily as needed for anxiety. 90 tablet 0   latanoprost  (XALATAN ) 0.005 % ophthalmic solution Place 1 drop into both eyes at bedtime.      pantoprazole  (PROTONIX ) 40 MG tablet Take 1 tablet (40 mg total) by mouth daily. 90 tablet 0   potassium chloride  SA (KLOR-CON  M) 20 MEQ tablet Take 1 tablet (20 mEq total) by mouth daily. 30 tablet 1   rosuvastatin  (CRESTOR ) 5 MG tablet Take 1 tablet (  5 mg total) by mouth daily. 90 tablet 3   Semaglutide ,0.25 or 0.5MG /DOS, (OZEMPIC , 0.25 OR 0.5 MG/DOSE,) 2 MG/3ML SOPN Inject 0.5 mg into the skin once a week. INJECT 0.25 MG SUBCUTANEOUSLY WEEKLY X 4 WKS THEN 0.5 MG WEEKLY 3 mL 1   VENTOLIN  HFA 108 (90 Base) MCG/ACT inhaler TAKE 2 PUFFS BY MOUTH EVERY 6 HOURS AS NEEDED FOR WHEEZE OR SHORTNESS OF BREATH 18 each 2   No current facility-administered medications for this visit.   Allergies: Allergies  Allergen Reactions   Capsicum Annuum Extract & Derivative (Bell Pepper) [Capsicum] Anaphylaxis   Milk-Related Compounds Anaphylaxis   Other Anaphylaxis and Other (See Comments)    Chrystie rancher  gummies   Peanuts [Peanut  Oil] Anaphylaxis and Itching   Shellfish Allergy  Anaphylaxis   Soy Allergy  (Obsolete) Other (See Comments) and Cough    Patient can't remember exact reaction    Tomato Anaphylaxis   Corn Dextrin Hives and Other (See Comments)    Plain corn ok, popcorn causes hives   Gluten Meal Other (See Comments) and Cough    Patient can't remember exact reaction    Morphine Itching and Rash   Sunflower Oil Itching and Other (See Comments)    Patient can't remember exact reaction    Wheat Nausea Only and Swelling    Patient can't remember exact reaction    I reviewed her past medical history, social history, family history, and environmental history and no significant changes have been reported from her previous visit.  Review of Systems  Constitutional:  Negative for appetite change, chills, fever and unexpected weight change.  HENT:  Negative for congestion and rhinorrhea.   Eyes:  Negative for itching.  Respiratory:  Negative for cough, chest tightness, shortness of breath and wheezing.   Cardiovascular:  Negative for chest pain.  Gastrointestinal:  Negative for abdominal pain.  Genitourinary:  Negative for difficulty urinating.  Skin:  Positive for rash.  Allergic/Immunologic: Positive for environmental allergies and food allergies.  Neurological:  Negative for headaches.    Objective: BP 128/80 (BP Location: Left Arm, Patient Position: Sitting, Cuff Size: Normal)   Pulse 98   Temp 97.8 F (36.6 C) (Temporal)   Resp 18   Ht 5' 5 (1.651 m)   Wt 198 lb 8 oz (90 kg)   SpO2 98%   BMI 33.03 kg/m  Body mass index is 33.03 kg/m. Physical Exam Vitals and nursing note reviewed.  Constitutional:      Appearance: She is well-developed.  HENT:     Head: Normocephalic and atraumatic.     Right Ear: Tympanic membrane and external ear normal.     Left Ear: Tympanic membrane and external ear normal.     Nose: Nose normal.     Mouth/Throat:     Mouth: Mucous  membranes are moist.     Pharynx: Oropharynx is clear.  Eyes:     Conjunctiva/sclera: Conjunctivae normal.  Cardiovascular:     Rate and Rhythm: Normal rate and regular rhythm.     Heart sounds: Normal heart sounds. No murmur heard.    No friction rub. No gallop.  Pulmonary:     Effort: Pulmonary effort is normal.     Breath sounds: Normal breath sounds. No wheezing, rhonchi or rales.  Musculoskeletal:     Cervical back: Neck supple.  Skin:    General: Skin is warm.     Findings: No rash.  Neurological:     Mental Status: She is alert and oriented  to person, place, and time.    Previous notes and tests were reviewed. The plan was reviewed with the patient/family, and all questions/concerned were addressed.  It was my pleasure to see Rillie today and participate in her care. Please feel free to contact me with any questions or concerns.  Sincerely,  Orlan Cramp, DO Allergy  & Immunology  [facility]  [place] office: [phone] [facility] office: [phone]

## 2024-02-12 NOTE — Patient Instructions (Addendum)
 Food allergies 2025 labs positive to shrimp, borderline to crab, lobster, oyster, carmine red dye. Negative to milk, peanuts, tree nuts, finned fish panel, paprika, tomato, wheat, soy, sunflower seed, annatto seed, corn.  Continue strict avoidance of seafood, peanuts, tree nuts, tomatoes, corn, jolly ranchers, sunflower, tomatoes, wheat, paprika, milk. Okay to eat red dye as before.  For mild symptoms you can take over the counter antihistamines (zyrtec  10mg  to 20mg ) and monitor symptoms closely.  If symptoms worsen or if you have severe symptoms including breathing issues, throat closure, significant swelling, whole body hives, severe diarrhea and vomiting, lightheadedness then use epinephrine  and seek immediate medical care afterwards. Emergency action plan in place.  Start Xolair 300mg  every 4 week injections to prevent having a bad allergic reaction. Tammy will start the PA process and will be in touch with you regarding coverage. Sign consent form.  This also helps with asthma and hives.   Asthma Daily controller medication(s): continue Symbicort  80mcg 2 puffs twice a day with spacer and rinse mouth afterwards. May use albuterol  rescue inhaler 2 puffs or nebulizer every 4 to 6 hours as needed for shortness of breath, chest tightness, coughing, and wheezing. May use albuterol  rescue inhaler 2 puffs 5 to 15 minutes prior to strenuous physical activities. Monitor frequency of use - if you need to use it more than twice per week on a consistent basis let us  know.  Breathing control goals:  Full participation in all desired activities (may need albuterol  before activity) Albuterol  use two times or less a week on average (not counting use with activity) Cough interfering with sleep two times or less a month Oral steroids no more than once a year No hospitalizations   Hives: Increase zyrtec  (cetirizine ) to 20mg  twice a day. If symptoms are not controlled or causes drowsiness let us   know. Continue Pepcid  (famotidine ) 20mg  twice a day.  Avoid the following potential triggers: alcohol, tight clothing, NSAIDs, hot showers and getting overheated. Continue proper skin care.  Start Xolair as above as well.   Environmental allergies 2025 labs positive to cockroach. Borderline to dust mites and grass. Continue environmental control measures.   Return in about 3 months (around 05/13/2024). Or sooner if needed.   Cockroach Allergen Avoidance Cockroaches are often found in the homes of densely populated urban areas, schools or commercial buildings, but these creatures can lurk almost anywhere. This does not mean that you have a dirty house or living area. Block all areas where roaches can enter the home. This includes crevices, wall cracks and windows.  Cockroaches need water to survive, so fix and seal all leaky faucets and pipes. Have an exterminator go through the house when your family and pets are gone to eliminate any remaining roaches. Keep food in lidded containers and put pet food dishes away after your pets are done eating. Vacuum and sweep the floor after meals, and take out garbage and recyclables. Use lidded garbage containers in the kitchen. Wash dishes immediately after use and clean under stoves, refrigerators or toasters where crumbs can accumulate. Wipe off the stove and other kitchen surfaces and cupboards regularly.  Control of House Dust Mite Allergen Dust mite allergens are a common trigger of allergy  and asthma symptoms. While they can be found throughout the house, these microscopic creatures thrive in warm, humid environments such as bedding, upholstered furniture and carpeting. Because so much time is spent in the bedroom, it is essential to reduce mite levels there.  Encase pillows, mattresses, and box  springs in special allergen-proof fabric covers or airtight, zippered plastic covers.  Bedding should be washed weekly in hot water (130 F) and dried in a  hot dryer. Allergen-proof covers are available for comforters and pillows that can't be regularly washed.  Wash the allergy -proof covers every few months. Minimize clutter in the bedroom. Keep pets out of the bedroom.  Keep humidity less than 50% by using a dehumidifier or air conditioning. You can buy a humidity measuring device called a hygrometer to monitor this.  If possible, replace carpets with hardwood, linoleum, or washable area rugs. If that's not possible, vacuum frequently with a vacuum that has a HEPA filter. Remove all upholstered furniture and non-washable window drapes from the bedroom. Remove all non-washable stuffed toys from the bedroom.  Wash stuffed toys weekly.  Reducing Pollen Exposure Pollen seasons: trees (spring), grass (summer) and ragweed/weeds (fall). Keep windows closed in your home and car to lower pollen exposure.  Install air conditioning in the bedroom and throughout the house if possible.  Avoid going out in dry windy days - especially early morning. Pollen counts are highest between 5 - 10 AM and on dry, hot and windy days.  Save outside activities for late afternoon or after a heavy rain, when pollen levels are lower.  Avoid mowing of grass if you have grass pollen allergy . Be aware that pollen can also be transported indoors on people and pets.  Dry your clothes in an automatic dryer rather than hanging them outside where they might collect pollen.  Rinse hair and eyes before bedtime.   Skin care recommendations  Bath time: Always use lukewarm water. AVOID very hot or cold water. Keep bathing time to 5-10 minutes. Do NOT use bubble bath. Use a mild soap and use just enough to wash the dirty areas. Do NOT scrub skin vigorously.  After bathing, pat dry your skin with a towel. Do NOT rub or scrub the skin.  Moisturizers and prescriptions:  ALWAYS apply moisturizers immediately after bathing (within 3 minutes). This helps to lock-in moisture. Use the  moisturizer several times a day over the whole body. Good summer moisturizers include: Aveeno, CeraVe, Cetaphil. Good winter moisturizers include: Aquaphor, Vaseline, Cerave, Cetaphil, Eucerin, Vanicream. When using moisturizers along with medications, the moisturizer should be applied about one hour after applying the medication to prevent diluting effect of the medication or moisturize around where you applied the medications. When not using medications, the moisturizer can be continued twice daily as maintenance.  Laundry and clothing: Avoid laundry products with added color or perfumes. Use unscented hypo-allergenic laundry products such as Tide free, Cheer free & gentle, and All free and clear.  If the skin still seems dry or sensitive, you can try double-rinsing the clothes. Avoid tight or scratchy clothing such as wool. Do not use fabric softeners or dyer sheets.

## 2024-02-14 ENCOUNTER — Telehealth: Payer: Self-pay | Admitting: *Deleted

## 2024-02-14 NOTE — Telephone Encounter (Signed)
 L/m for patient to contact me to advise approval and submit to [facility] for Xolair 

## 2024-02-14 NOTE — Telephone Encounter (Signed)
-----   Message from Orlan CHRISTELLA Cramp sent at 02/12/2024 12:06 PM EDT ----- Please start PA for Xolair 300mg  every 4 weeks for food allergies and hives. Total IgE 102. Thank you.

## 2024-02-27 NOTE — Telephone Encounter (Signed)
 Tried to reach patient unable to leave message VM full

## 2024-03-04 NOTE — Telephone Encounter (Signed)
L/m for patient to contact me again ?

## 2024-03-06 ENCOUNTER — Other Ambulatory Visit: Payer: Self-pay | Admitting: Allergy

## 2024-03-06 DIAGNOSIS — J3089 Other allergic rhinitis: Secondary | ICD-10-CM

## 2024-03-11 NOTE — Telephone Encounter (Signed)
 Mychart message sent to patient as last resort

## 2024-03-17 ENCOUNTER — Other Ambulatory Visit: Payer: Self-pay | Admitting: Allergy

## 2024-03-26 NOTE — Telephone Encounter (Signed)
 No response from patient

## 2024-04-13 ENCOUNTER — Other Ambulatory Visit: Payer: Self-pay | Admitting: Family Medicine

## 2024-04-13 DIAGNOSIS — E1169 Type 2 diabetes mellitus with other specified complication: Secondary | ICD-10-CM

## 2024-05-15 ENCOUNTER — Encounter: Payer: Self-pay | Admitting: Allergy

## 2024-05-15 ENCOUNTER — Ambulatory Visit: Admitting: Allergy

## 2024-05-15 ENCOUNTER — Other Ambulatory Visit: Payer: Self-pay

## 2024-05-15 VITALS — BP 122/90 | HR 80 | Temp 98.0°F | Resp 16 | Ht 65.0 in | Wt 198.0 lb

## 2024-05-15 DIAGNOSIS — L508 Other urticaria: Secondary | ICD-10-CM

## 2024-05-15 DIAGNOSIS — J4540 Moderate persistent asthma, uncomplicated: Secondary | ICD-10-CM

## 2024-05-15 DIAGNOSIS — T7800XD Anaphylactic reaction due to unspecified food, subsequent encounter: Secondary | ICD-10-CM | POA: Diagnosis not present

## 2024-05-15 DIAGNOSIS — J3089 Other allergic rhinitis: Secondary | ICD-10-CM | POA: Diagnosis not present

## 2024-05-15 DIAGNOSIS — T7840XD Allergy, unspecified, subsequent encounter: Secondary | ICD-10-CM

## 2024-05-15 NOTE — Progress Notes (Signed)
 Follow Up Note  RE: Megan Wilkerson MRN: [id number] DOB: [birth year] Date of Office Visit: 05/15/2024  Referring provider: Jordan, Betty G, MD Primary care provider: Jordan, Betty G, MD  Chief Complaint: Follow-up (She states she got itchy/ redness on face and back since couple months because of weather change to cold. )  History of Present Illness: I had the pleasure of seeing Megan Wilkerson for a follow up visit at the [facility] on 05/15/2024. She is a 58 y.o. female, who is being followed for allergic reactions, food allergy , urticaria, allergic rhinitis, asthma, GERD. Her previous allergy  office visit was on 02/12/2024 with Dr. Luke. Today is a regular follow up visit.  Discussed the use of AI scribe software for clinical note transcription with the patient, who gave verbal consent to proceed.    She experiences daily breakouts primarily on her face and bottom. Despite using Aquaphor, she continues to experience significant itching.  She is currently taking Zyrtec , two tablets twice a day, and famotidine , 20 mg twice a day, but these medications are not alleviating her symptoms. She has not yet started Xolair  injections due to communication issues as she did not return the biologic's coordinator's phone calls but Xolair  was approved in September.   She has not needed to use her rescue inhaler. She continues to use Symbicort  80mcg 2 twice a day and has not required emergency care for asthma exacerbations.  She avoids foods that trigger her allergies, including tomatoes and corn. She recalls having used Allegra in the past.   She lives 14 minutes away from the office and relies on her sister for transportation currently.   Assessment and Plan: Megan Wilkerson is a 58 y.o. 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 Recent episode  required EpiPen . Most serious reactions to seafood fumes, tomato ingestion. Last work up many years ago and she did have multiple positives. Normal tryptase in 2024. Sometimes she does experiment and tries to ingest her known food allergens to see if she is still allergic to it. Not interested in food reintroduction. 2025 labs positive to shrimp, borderline to crab, lobster, oyster, carmine red dye. Negative to milk, peanuts, tree nuts, finned fish panel, paprika, tomato, wheat, soy, sunflower seed, annatto seed, corn. Interim history - no reactions.  Continue strict avoidance of seafood, peanuts, tree nuts, tomatoes, corn, jolly ranchers, sunflower, tomatoes, wheat, paprika, milk. Okay to eat red dye as before.  For mild symptoms you can take over the counter antihistamines (zyrtec  10mg  to 20mg ) and monitor symptoms closely.  If symptoms worsen or if you have severe symptoms including breathing issues, throat closure, significant swelling, whole body hives, severe diarrhea and vomiting, lightheadedness then use epinephrine  and seek immediate medical care afterwards. Emergency action plan in place.  Start Xolair  300mg  every 4 week injections to prevent having a bad allergic reaction. This also helps with asthma and hives.  Patient will get back to us  regarding starting sample this week or next week.    Urticaria Past history - Sometime breakouts in hives with no known triggers. Interim history - still has daily breakouts. Xolair  was approved in September but didn't start as she didn't return phone calls. Continue zyrtec  (cetirizine ) 20mg  twice a day. If symptoms are not controlled or causes drowsiness let us  know. Continue Pepcid  (famotidine ) 20mg  twice a day.  Avoid the following potential triggers: alcohol, tight  clothing, NSAIDs, hot showers and getting overheated. Continue proper skin care.  Start Xolair  as above.  Other allergic rhinitis Past history - 2025 labs positive to cockroach.  Borderline to dust mites and grass. The zyrtec  as above should help with your symptoms.   Moderate persistent asthma Past history - increased wheezing and shortness of breath post-anaphylaxis. 2025 spirometry showed normal pattern with 3% and 90cc improvement in FEV1 post bronchodilator treatment. Clinically feeling improved.  Interim history - Well-controlled asthma with Symbicort . No recent rescue inhaler use or emergency visits. Normal spirometry today. Daily controller medication(s): continue Symbicort  80mcg 2 puffs twice a day with spacer and rinse mouth afterwards. May use albuterol  rescue inhaler 2 puffs or nebulizer every 4 to 6 hours as needed for shortness of breath, chest tightness, coughing, and wheezing. May use albuterol  rescue inhaler 2 puffs 5 to 15 minutes prior to strenuous physical activities. Monitor frequency of use - if you need to use it more than twice per week on a consistent basis let us  know.    Return in about 3 months (around 08/13/2024).  No orders of the defined types were placed in this encounter.  Lab Orders  No laboratory test(s) ordered today    Diagnostics: Spirometry:  Tracings reviewed. Her effort: Good reproducible efforts. FVC: 2.88L FEV1: 2.37L, 103% predicted FEV1/FVC ratio: 82% Interpretation: Spirometry consistent with normal pattern.  Please see scanned spirometry results for details.  Results discussed with patient/family.   Medication List:  Current Outpatient Medications  Medication Sig Dispense Refill   Accu-Chek Softclix Lancets lancets Use to test blood sugar daily. 100 each 12   brimonidine  (ALPHAGAN ) 0.15 % ophthalmic solution Place 1 drop into both eyes 2 (two) times daily.     budesonide -formoterol  (SYMBICORT ) 80-4.5 MCG/ACT inhaler Inhale 2 puffs into the lungs 2 (two) times daily. 10.2 g 3   cetirizine  (ZYRTEC ) 10 MG tablet TAKE 1 TABLET BY MOUTH TWICE A DAY 60 tablet 2   diphenhydrAMINE  (BENADRYL ) 25 MG tablet Take 1  tablet (25 mg total) by mouth every 6 (six) hours as needed. 30 tablet 0   EPINEPHrine  0.3 mg/0.3 mL IJ SOAJ injection Inject 0.3 mg into the muscle as needed for anaphylaxis. 2 each 0   famotidine  (PEPCID ) 20 MG tablet TAKE 1 TABLET BY MOUTH TWICE A DAY 180 tablet 6   glucose blood (ACCU-CHEK GUIDE TEST) test strip Use to test blood sugar daily. 100 each 12   hydrOXYzine  (ATARAX ) 25 MG tablet Take 1 tablet (25 mg total) by mouth 3 (three) times daily as needed for anxiety. 90 tablet 0   latanoprost  (XALATAN ) 0.005 % ophthalmic solution Place 1 drop into both eyes at bedtime.      pantoprazole  (PROTONIX ) 40 MG tablet Take 1 tablet (40 mg total) by mouth daily. 90 tablet 0   potassium chloride  SA (KLOR-CON  M) 20 MEQ tablet Take 1 tablet (20 mEq total) by mouth daily. 30 tablet 1   rosuvastatin  (CRESTOR ) 5 MG tablet Take 1 tablet (5 mg total) by mouth daily. 90 tablet 3   Semaglutide ,0.25 or 0.5MG /DOS, (OZEMPIC , 0.25 OR 0.5 MG/DOSE,) 2 MG/3ML SOPN INJECT 0.5 MG INTO THE SKIN ONCE A WEEK. INJECT 0.25 MG SUBCUTANEOUSLY WEEKLY X 4 WKS THEN 0.5 MG WEEKLY 3 mL 1   VENTOLIN  HFA 108 (90 Base) MCG/ACT inhaler TAKE 2 PUFFS BY MOUTH EVERY 6 HOURS AS NEEDED FOR WHEEZE OR SHORTNESS OF BREATH 18 each 2   No current facility-administered medications for this visit.   Allergies:  Allergies[1] I reviewed her past medical history, social history, family history, and environmental history and no significant changes have been reported from her previous visit.  Review of Systems  Constitutional:  Negative for appetite change, chills, fever and unexpected weight change.  HENT:  Negative for congestion and rhinorrhea.   Eyes:  Negative for itching.  Respiratory:  Negative for cough, chest tightness, shortness of breath and wheezing.   Cardiovascular:  Negative for chest pain.  Gastrointestinal:  Negative for abdominal pain.  Genitourinary:  Negative for difficulty urinating.  Skin:  Positive for rash.   Allergic/Immunologic: Positive for environmental allergies and food allergies.  Neurological:  Negative for headaches.    Objective: BP (!) 122/90 (BP Location: Left Arm, Patient Position: Sitting, Cuff Size: Normal)   Pulse 80   Temp 98 F (36.7 C) (Temporal)   Resp 16   Ht 5' 5 (1.651 m)   Wt 198 lb (89.8 kg)   SpO2 98%   BMI 32.95 kg/m  Body mass index is 32.95 kg/m. Physical Exam Vitals and nursing note reviewed.  Constitutional:      Appearance: She is well-developed.  HENT:     Head: Normocephalic and atraumatic.     Right Ear: Tympanic membrane and external ear normal.     Left Ear: Tympanic membrane and external ear normal.     Nose: Nose normal.     Mouth/Throat:     Mouth: Mucous membranes are moist.     Pharynx: Oropharynx is clear.  Eyes:     Conjunctiva/sclera: Conjunctivae normal.  Cardiovascular:     Rate and Rhythm: Normal rate and regular rhythm.     Heart sounds: Normal heart sounds. No murmur heard.    No friction rub. No gallop.  Pulmonary:     Effort: Pulmonary effort is normal.     Breath sounds: Normal breath sounds. No wheezing, rhonchi or rales.  Musculoskeletal:     Cervical back: Neck supple.  Skin:    General: Skin is warm.     Comments: Erythematous hue on face but no visible urticarial lesions noted.  Neurological:     Mental Status: She is alert and oriented to person, place, and time.    Previous notes and tests were reviewed. The plan was reviewed with the patient/family, and all questions/concerned were addressed.  It was my pleasure to see Eevie today and participate in her care. Please feel free to contact me with any questions or concerns.  Sincerely,  Orlan Cramp, DO Allergy  & Immunology  [facility]  [facility] office: [phone] [facility] office: [phone]     [1]  Allergies Allergen Reactions   Capsicum Annuum Extract & Derivative (Bell Pepper) [Capsicum] Anaphylaxis    Milk-Related Compounds Anaphylaxis   Other Anaphylaxis and Other (See Comments)    Chrystie rancher gummies   Peanuts [Peanut  Oil] Anaphylaxis and Itching   Shellfish Allergy  Anaphylaxis   Soy Allergy  (Obsolete) Other (See Comments) and Cough    Patient can't remember exact reaction    Tomato Anaphylaxis   Corn Dextrin Hives and Other (See Comments)    Plain corn ok, popcorn causes hives   Gluten Meal Other (See Comments) and Cough    Patient can't remember exact reaction    Morphine Itching and Rash   Sunflower Oil Itching and Other (See Comments)    Patient can't remember exact reaction    Wheat Nausea Only and Swelling    Patient can't remember exact reaction

## 2024-05-15 NOTE — Patient Instructions (Addendum)
 Please call Tammy at [phone] to set up Xolair .  Please let me know if you want to do a sample injection this week or next week - depends on nursing schedule.   Food allergies 2025 labs positive to shrimp, borderline to crab, lobster, oyster, carmine red dye. Negative to milk, peanuts, tree nuts, finned fish panel, paprika, tomato, wheat, soy, sunflower seed, annatto seed, corn.  Continue strict avoidance of seafood, peanuts, tree nuts, tomatoes, corn, jolly ranchers, sunflower, tomatoes, wheat, paprika, milk. Okay to eat red dye as before.  For mild symptoms you can take over the counter antihistamines (zyrtec  10mg  to 20mg ) and monitor symptoms closely.  If symptoms worsen or if you have severe symptoms including breathing issues, throat closure, significant swelling, whole body hives, severe diarrhea and vomiting, lightheadedness then use epinephrine  and seek immediate medical care afterwards. Emergency action plan in place.  Start Xolair  300mg  every 4 week injections to prevent having a bad allergic reaction. This also helps with asthma and hives.   Asthma Daily controller medication(s): continue Symbicort  80mcg 2 puffs twice a day with spacer and rinse mouth afterwards. May use albuterol  rescue inhaler 2 puffs or nebulizer every 4 to 6 hours as needed for shortness of breath, chest tightness, coughing, and wheezing. May use albuterol  rescue inhaler 2 puffs 5 to 15 minutes prior to strenuous physical activities. Monitor frequency of use - if you need to use it more than twice per week on a consistent basis let us  know.  Breathing control goals:  Full participation in all desired activities (may need albuterol  before activity) Albuterol  use two times or less a week on average (not counting use with activity) Cough interfering with sleep two times or less a month Oral steroids no more than once a year No hospitalizations   Hives: Continue zyrtec  (cetirizine ) 20mg  twice a day. If  symptoms are not controlled or causes drowsiness let us  know. Continue Pepcid  (famotidine ) 20mg  twice a day.  Avoid the following potential triggers: alcohol, tight clothing, NSAIDs, hot showers and getting overheated. Continue proper skin care.  Start Xolair  as above as well.   Environmental allergies 2025 labs positive to cockroach. Borderline to dust mites and grass. Continue environmental control measures.   Return in about 3 months (around 08/13/2024). Or sooner if needed.   Cockroach Allergen Avoidance Cockroaches are often found in the homes of densely populated urban areas, schools or commercial buildings, but these creatures can lurk almost anywhere. This does not mean that you have a dirty house or living area. Block all areas where roaches can enter the home. This includes crevices, wall cracks and windows.  Cockroaches need water to survive, so fix and seal all leaky faucets and pipes. Have an exterminator go through the house when your family and pets are gone to eliminate any remaining roaches. Keep food in lidded containers and put pet food dishes away after your pets are done eating. Vacuum and sweep the floor after meals, and take out garbage and recyclables. Use lidded garbage containers in the kitchen. Wash dishes immediately after use and clean under stoves, refrigerators or toasters where crumbs can accumulate. Wipe off the stove and other kitchen surfaces and cupboards regularly.  Control of House Dust Mite Allergen Dust mite allergens are a common trigger of allergy  and asthma symptoms. While they can be found throughout the house, these microscopic creatures thrive in warm, humid environments such as bedding, upholstered furniture and carpeting. Because so much time is spent in the bedroom,  it is essential to reduce mite levels there.  Encase pillows, mattresses, and box springs in special allergen-proof fabric covers or airtight, zippered plastic covers.  Bedding should  be washed weekly in hot water (130 F) and dried in a hot dryer. Allergen-proof covers are available for comforters and pillows that cant be regularly washed.  Wash the allergy -proof covers every few months. Minimize clutter in the bedroom. Keep pets out of the bedroom.  Keep humidity less than 50% by using a dehumidifier or air conditioning. You can buy a humidity measuring device called a hygrometer to monitor this.  If possible, replace carpets with hardwood, linoleum, or washable area rugs. If that's not possible, vacuum frequently with a vacuum that has a HEPA filter. Remove all upholstered furniture and non-washable window drapes from the bedroom. Remove all non-washable stuffed toys from the bedroom.  Wash stuffed toys weekly.  Reducing Pollen Exposure Pollen seasons: trees (spring), grass (summer) and ragweed/weeds (fall). Keep windows closed in your home and car to lower pollen exposure.  Install air conditioning in the bedroom and throughout the house if possible.  Avoid going out in dry windy days - especially early morning. Pollen counts are highest between 5 - 10 AM and on dry, hot and windy days.  Save outside activities for late afternoon or after a heavy rain, when pollen levels are lower.  Avoid mowing of grass if you have grass pollen allergy . Be aware that pollen can also be transported indoors on people and pets.  Dry your clothes in an automatic dryer rather than hanging them outside where they might collect pollen.  Rinse hair and eyes before bedtime.   Skin care recommendations  Bath time: Always use lukewarm water. AVOID very hot or cold water. Keep bathing time to 5-10 minutes. Do NOT use bubble bath. Use a mild soap and use just enough to wash the dirty areas. Do NOT scrub skin vigorously.  After bathing, pat dry your skin with a towel. Do NOT rub or scrub the skin.  Moisturizers and prescriptions:  ALWAYS apply moisturizers immediately after bathing (within  3 minutes). This helps to lock-in moisture. Use the moisturizer several times a day over the whole body. Good summer moisturizers include: Aveeno, CeraVe, Cetaphil. Good winter moisturizers include: Aquaphor, Vaseline, Cerave, Cetaphil, Eucerin, Vanicream. When using moisturizers along with medications, the moisturizer should be applied about one hour after applying the medication to prevent diluting effect of the medication or moisturize around where you applied the medications. When not using medications, the moisturizer can be continued twice daily as maintenance.  Laundry and clothing: Avoid laundry products with added color or perfumes. Use unscented hypo-allergenic laundry products such as Tide free, Cheer free & gentle, and All free and clear.  If the skin still seems dry or sensitive, you can try double-rinsing the clothes. Avoid tight or scratchy clothing such as wool. Do not use fabric softeners or dyer sheets.

## 2024-05-16 ENCOUNTER — Telehealth: Payer: Self-pay | Admitting: *Deleted

## 2024-05-16 DIAGNOSIS — L501 Idiopathic urticaria: Secondary | ICD-10-CM

## 2024-05-16 MED ORDER — XOLAIR 300 MG/2ML ~~LOC~~ SOSY: 300.0000 mg | PREFILLED_SYRINGE | SUBCUTANEOUS | 11 refills | Status: DC

## 2024-05-16 NOTE — Addendum Note (Signed)
 Addended by: OTHA MADELIN HERO on: 05/16/2024 04:15 PM   Modules accepted: Orders

## 2024-05-16 NOTE — Telephone Encounter (Signed)
 Patient called and l/m that she wants to start Xolair  that was previously approved. I advised will send Rx to [facility] and reach out to make appt to start therapy in clinic with one hour wait

## 2024-05-17 ENCOUNTER — Other Ambulatory Visit: Payer: Self-pay | Admitting: Family Medicine

## 2024-05-17 ENCOUNTER — Other Ambulatory Visit: Payer: Self-pay

## 2024-05-17 ENCOUNTER — Other Ambulatory Visit (HOSPITAL_COMMUNITY): Payer: Self-pay

## 2024-05-17 ENCOUNTER — Ambulatory Visit: Attending: Allergy

## 2024-05-17 DIAGNOSIS — S6991XA Unspecified injury of right wrist, hand and finger(s), initial encounter: Secondary | ICD-10-CM

## 2024-05-17 DIAGNOSIS — L501 Idiopathic urticaria: Secondary | ICD-10-CM

## 2024-05-17 DIAGNOSIS — Z79899 Other long term (current) drug therapy: Secondary | ICD-10-CM

## 2024-05-17 MED ORDER — XOLAIR 300 MG/2ML ~~LOC~~ SOSY
300.0000 mg | PREFILLED_SYRINGE | SUBCUTANEOUS | 11 refills | Status: AC
  Filled 2024-05-17: qty 2, 28d supply, fill #0
  Filled 2024-06-10: qty 2, 28d supply, fill #1

## 2024-05-17 NOTE — Progress Notes (Signed)
 Specialty Pharmacy Initial Fill Coordination Note  Meredyth Hornung is a 58 y.o. female contacted today regarding initial fill of specialty medication(s) Omalizumab  (Xolair )   Patient requested Courier to Provider Office   Delivery date: 05/21/24   Verified address: [address] [place], [place] [address]   Medication will be filled on: 05/20/24   Patient is aware of $4.00 copayment.

## 2024-05-17 NOTE — Progress Notes (Signed)
 Specialty Pharmacy Initiation Note  Patient counseled in Telephone Visit on 05/17/24   Megan Wilkerson is a 58 y.o. female who will be followed by the specialty pharmacy service for RxSp Allergy     Review of administration, indication, effectiveness, safety, potential side effects, storage/disposable, and missed dose instructions occurred today for patient's specialty medication(s) Omalizumab  (Xolair )     Patient/Caregiver did not have any additional questions or concerns.   Patient's therapy is appropriate to: Initiate    Goals Addressed             This Visit's Progress    Reduce signs and symptoms       Patient is initiating therapy. Patient will maintain adherence and avoid flare triggers         Abigal Choung M Keaundre Thelin Specialty Pharmacist

## 2024-05-17 NOTE — Progress Notes (Signed)
$  4.00 copay

## 2024-05-17 NOTE — Progress Notes (Signed)
 [facility] - New Start Biologic  Referring Provider: Orlan Cramp  Virtual Visit via Telephone Note  I connected with Megan Wilkerson on 05/17/2024 at  3:30 PM EST by telephone and verified that I am speaking with the correct person using two identifiers.  Location: Patient: home Provider: office   I discussed the limitations, risks, security and privacy concerns of performing an evaluation and management service by telephone and the availability of in person appointments. I also discussed with the patient that there may be a patient responsible charge related to this service. The patient expressed understanding and agreed to proceed.  HPI: Megan Wilkerson is a 58 y.o. female who presents to the pharmacotherapy clinic via telephone for new start Xolair  initial education. Last OV with Orlan Cramp was on 05/15/24.   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   BUDESONIDE -FORMOTEROL  (SYMBICORT ) 80-4.5 MCG/ACT INHALER    Inhale 2 puffs into the lungs 2 (two) times daily.   CETIRIZINE  (ZYRTEC ) 10 MG TABLET    TAKE 1 TABLET BY MOUTH TWICE A DAY   DIPHENHYDRAMINE  (BENADRYL ) 25 MG TABLET    Take 1 tablet (25 mg total) by mouth every 6 (six) hours as needed.   EPINEPHRINE  0.3 MG/0.3 ML IJ SOAJ INJECTION    Inject 0.3 mg into the muscle as needed for anaphylaxis.   FAMOTIDINE  (PEPCID ) 20 MG TABLET    TAKE 1 TABLET BY MOUTH TWICE A DAY   HYDROXYZINE  (ATARAX ) 25 MG TABLET    Take 1 tablet (25 mg total) by mouth 3 (three) times daily as needed for anxiety.   LATANOPROST  (XALATAN ) 0.005 % OPHTHALMIC SOLUTION    Place 1 drop into both eyes at bedtime.    PANTOPRAZOLE  (PROTONIX ) 40 MG TABLET    Take 1 tablet (40 mg total) by mouth daily.   POTASSIUM CHLORIDE  SA (KLOR-CON  M) 20 MEQ TABLET    Take 1 tablet (20 mEq total) by mouth daily.   ROSUVASTATIN  (CRESTOR ) 5 MG TABLET    Take 1 tablet (5 mg total) by mouth daily.   SEMAGLUTIDE ,0.25 OR 0.5MG /DOS, (OZEMPIC , 0.25 OR 0.5 MG/DOSE,) 2 MG/3ML SOPN    INJECT 0.5 MG INTO THE SKIN ONCE A WEEK. INJECT 0.25 MG SUBCUTANEOUSLY WEEKLY X 4 WKS THEN 0.5 MG WEEKLY   VENTOLIN  HFA 108 (90 BASE) MCG/ACT INHALER    TAKE 2 PUFFS BY MOUTH  EVERY 6 HOURS AS NEEDED FOR WHEEZE OR SHORTNESS OF BREATH  Modified Medications   Modified Medication Previous Medication   OMALIZUMAB  (XOLAIR ) 300 MG/2  ML PREFILLED SYRINGE omalizumab  (XOLAIR ) 300 MG/2  ML prefilled syringe      Inject 300 mg into the skin every 28 (twenty-eight) days.    Inject 300 mg into the skin every 28 (twenty-eight) days.  Discontinued Medications   No medications on file    Allergies: Allergies[1]  Past Medical History: Past Medical History:  Diagnosis Date   Allergy  2012   NUMEROUS ALLERGIES AND ANAPHYLACTIC REACTIONS SINCE 2012   Anaphylactic reaction    several ED visits for same since 01/2012    Anemia    IDA    Asthma    Blood transfusion without reported diagnosis 1988   AFTER  CHILDBIRTH   GERD    Glaucoma    Hives of unknown origin    chronic and recurrent urticaria since 01/2012   HYPERLIPIDEMIA, WITH LOW HDL    MIGRAINE HEADACHE    PERIPHERAL NEUROPATHY    POSITIVE PPD 1987   s/p 13mo tx   Seizures (HCC)    Sleep apnea 2018   CPAP    Social History: Social History   Socioeconomic History   Marital status: Married    Spouse name: Tajia Szeliga   Number of children: Not on file   Years of education: Not on file   Highest education level: Not on file  Occupational History   Not on file  Tobacco Use   Smoking status: Never    Passive exposure: Current   Smokeless tobacco: Never  Vaping Use   Vaping status: Never Used  Substance and Sexual Activity   Alcohol use: No   Drug use: No   Sexual activity: Not on file  Other Topics Concern   Not on file  Social History Narrative   Married, lives with spouse, son and nephew   Futures Trader, 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   Social Drivers of Health   Tobacco Use: Medium Risk (05/15/2024)   Patient History    Smoking Tobacco Use: Never    Smokeless Tobacco Use: Never    Passive Exposure: Current  Financial Resource Strain: Not on file  Food Insecurity: No Food Insecurity (11/09/2023)   Epic    Worried About Programme Researcher, Broadcasting/film/video in the Last Year: Never true    Ran Out of Food in the Last Year: Never true  Transportation Needs: No Transportation Needs (11/09/2023)   Epic    Lack of Transportation (Medical): No    Lack of Transportation (Non-Medical): No  Physical Activity: Not on file  Stress: Not on file  Social Connections: Not on file  Depression (PHQ2-9): Low Risk (06/19/2023)   Depression (PHQ2-9)    PHQ-2 Score: 0  Alcohol Screen: Not on file  Housing: Low Risk (11/09/2023)   Epic    Unable to Pay for Housing in the Last Year: No    Number of  Times Moved in the Last Year: 0    Homeless in the Last Year: No  Utilities: Not At Risk (11/09/2023)   Epic    Threatened with loss of utilities: No  Health Literacy: Not on file       Assessment/Plan: 1. Patient prescribed Xolair  for chronic urticaria. Reviewed the medication with the patient, including the following:   Goals of therapy: Mechanism: IgG monoclonal antibody (recombinant DNA  derived) which inhibits IgE binding to the high-affinity IgE receptor on mast cells and basophils.  Response to therapy: may take 3 to 6 months to determine efficacy. Discussed that patients generally feel improvement sooner than 3 months.  Side effects: anaphylaxis (0.1%) (black boxed warning), injection site reaction (45%), arthralgia (2.9% to 8%), headache (3% to 15%)   Plan:  - Patient will be scheduled for new start Xolair  appointment at Allergy  and Asthma clinic when medication arrives to office.  - Rx will be triaged to [facility] for courier to Allergy  and Asthma clinic.   I discussed the assessment and treatment plan with the patient. The patient was provided an opportunity to ask questions and all were answered. The patient agreed with the plan and demonstrated an understanding of the instructions.   The patient was advised to call back or seek an in-person evaluation if the symptoms worsen or if the condition fails to improve as anticipated.  I provided 20 minutes of non-face-to-face time during this encounter.  Patient verbalizes understanding and agreement with plan.   Delon Brow, PharmD, CSP, AAHIVP, CPP Clinical Pharmacist Practitioner - Medication Therapy Disease Management/Specialty Pharmacy Services 05/17/2024, 4:17 PM    [1]  Allergies Allergen Reactions   Capsicum Annuum Extract & Derivative (Bell Pepper) [Capsicum] Anaphylaxis   Milk-Related Compounds Anaphylaxis   Other Anaphylaxis and Other (See Comments)    Chrystie rancher gummies   Peanuts  [Peanut  Oil] Anaphylaxis and Itching   Shellfish Allergy  Anaphylaxis   Soy Allergy  (Obsolete) Other (See Comments) and Cough    Patient can't remember exact reaction    Tomato Anaphylaxis   Corn Dextrin Hives and Other (See Comments)    Plain corn ok, popcorn causes hives   Gluten Meal Other (See Comments) and Cough    Patient can't remember exact reaction    Morphine Itching and Rash   Sunflower Oil Itching and Other (See Comments)    Patient can't remember exact reaction    Wheat Nausea Only and Swelling    Patient can't remember exact reaction

## 2024-05-20 ENCOUNTER — Other Ambulatory Visit: Payer: Self-pay

## 2024-05-21 NOTE — Telephone Encounter (Signed)
 Tried to contact patient to advise Xolair  delivery to clinic and make appt but voicemail full

## 2024-06-10 ENCOUNTER — Other Ambulatory Visit (HOSPITAL_COMMUNITY): Payer: Self-pay

## 2024-06-12 ENCOUNTER — Encounter: Payer: Self-pay | Admitting: Family Medicine

## 2024-06-12 ENCOUNTER — Ambulatory Visit (INDEPENDENT_AMBULATORY_CARE_PROVIDER_SITE_OTHER): Admitting: Family Medicine

## 2024-06-12 VITALS — BP 124/84 | HR 78 | Temp 98.4°F | Resp 16 | Ht 65.0 in | Wt 194.6 lb

## 2024-06-12 DIAGNOSIS — E876 Hypokalemia: Secondary | ICD-10-CM

## 2024-06-12 DIAGNOSIS — E785 Hyperlipidemia, unspecified: Secondary | ICD-10-CM | POA: Diagnosis not present

## 2024-06-12 DIAGNOSIS — Z1239 Encounter for other screening for malignant neoplasm of breast: Secondary | ICD-10-CM

## 2024-06-12 DIAGNOSIS — M722 Plantar fascial fibromatosis: Secondary | ICD-10-CM | POA: Diagnosis not present

## 2024-06-12 DIAGNOSIS — Z7985 Long-term (current) use of injectable non-insulin antidiabetic drugs: Secondary | ICD-10-CM

## 2024-06-12 DIAGNOSIS — M65331 Trigger finger, right middle finger: Secondary | ICD-10-CM | POA: Diagnosis not present

## 2024-06-12 DIAGNOSIS — E1169 Type 2 diabetes mellitus with other specified complication: Secondary | ICD-10-CM | POA: Diagnosis not present

## 2024-06-12 DIAGNOSIS — Z23 Encounter for immunization: Secondary | ICD-10-CM | POA: Diagnosis not present

## 2024-06-12 LAB — LIPID PANEL
Cholesterol: 141 mg/dL (ref 28–200)
HDL: 42 mg/dL
LDL Cholesterol: 83 mg/dL (ref 10–99)
NonHDL: 98.57
Total CHOL/HDL Ratio: 3
Triglycerides: 76 mg/dL (ref 10.0–149.0)
VLDL: 15.2 mg/dL (ref 0.0–40.0)

## 2024-06-12 LAB — COMPREHENSIVE METABOLIC PANEL WITH GFR
ALT: 9 U/L (ref 3–35)
AST: 15 U/L (ref 5–37)
Albumin: 4.3 g/dL (ref 3.5–5.2)
Alkaline Phosphatase: 66 U/L (ref 39–117)
BUN: 8 mg/dL (ref 6–23)
CO2: 30 meq/L (ref 19–32)
Calcium: 9.6 mg/dL (ref 8.4–10.5)
Chloride: 106 meq/L (ref 96–112)
Creatinine, Ser: 0.87 mg/dL (ref 0.40–1.20)
GFR: 73.89 mL/min
Glucose, Bld: 90 mg/dL (ref 70–99)
Potassium: 3.4 meq/L — ABNORMAL LOW (ref 3.5–5.1)
Sodium: 141 meq/L (ref 135–145)
Total Bilirubin: 0.6 mg/dL (ref 0.2–1.2)
Total Protein: 7.6 g/dL (ref 6.0–8.3)

## 2024-06-12 LAB — MICROALBUMIN / CREATININE URINE RATIO
Creatinine,U: 202 mg/dL
Microalb Creat Ratio: 6.9 mg/g (ref 0.0–30.0)
Microalb, Ur: 1.4 mg/dL (ref 0.7–1.9)

## 2024-06-12 LAB — POCT GLYCOSYLATED HEMOGLOBIN (HGB A1C): Hemoglobin A1C: 5.4 % (ref 4.0–5.6)

## 2024-06-12 MED ORDER — OZEMPIC (0.25 OR 0.5 MG/DOSE) 2 MG/3ML ~~LOC~~ SOPN: 0.5000 mg | PEN_INJECTOR | SUBCUTANEOUS | 1 refills | Status: AC

## 2024-06-12 NOTE — Patient Instructions (Addendum)
 A few things to remember from today's visit:  Type 2 diabetes mellitus with other specified complication, without long-term current use of insulin  (HCC) - Plan: POC HgB A1c, Microalbumin / creatinine urine ratio, Comprehensive metabolic panel with GFR, Semaglutide ,0.25 or 0.5MG /DOS, (OZEMPIC , 0.25 OR 0.5 MG/DOSE,) 2 MG/3ML SOPN  Hyperlipidemia, unspecified hyperlipidemia type - Plan: Comprehensive metabolic panel with GFR, Lipid panel  Encounter for screening for malignant neoplasm of breast, unspecified screening modality - Plan: MM 3D SCREENING MAMMOGRAM BILATERAL BREAST  Trigger middle finger of right hand - Plan: Ambulatory referral to Sports Medicine  Plantar fasciitis - Plan: Ambulatory referral to Sports Medicine  No changes today.  If you need refills for medications you take chronically, please call your pharmacy. Do not use My Chart to request refills or for acute issues that need immediate attention. If you send a my chart message, it may take a few days to be addressed, specially if I am not in the office.  Please be sure medication list is accurate. If a new problem present, please set up appointment sooner than planned today.

## 2024-06-12 NOTE — Progress Notes (Signed)
 "  ACUTE VISIT Chief Complaint  Patient presents with   Hand Pain    Right hand pain  - having trouble doing basic daily living things like cooking, cleaning, opening jars, etc.    Foot Pain    X 2 weeks off and on    Discussed the use of AI scribe software for clinical note transcription with the patient, who gave verbal consent to proceed.  History of Present Illness Megan Wilkerson is a 59 year old right-handed female with a PMHx significant for asthma, chronic urticaria,HLD, DM II, OSA, GERD, and peripheral neuropathy  who presents with foot and hand pain as described above. Last visit here in the office 12/15/23.  She experiences pain in her right hand, specifically in the third finger, described as a 'trigger finger' that does not fully extend. The pain has been present intermittently for a couple of months and is now persistent, affecting her ability to perform daily activities such as opening things around the house. She rates the pain as a 7 out of 10 and has been taking Tylenol  for relief. No hx if recent trauma, she has not noted edema or erythema.   She also has pain in her right heel for the past two weeks, which worsens with walking, even short distances, and is relieved by resting. The pain is absent upon waking but starts when she begins moving around the house, mentions that it is more noticeable when walking to the bathroom in the morning. She has not changed her footwear recently and avoids walking barefoot due to her diabetes. Pain is stable. No trauma and no edema on area.  HLD: She is also taking Crestor  5 mg daily.  Component     Latest Ref Rng 07/07/2023  Cholesterol     28 - 200 mg/dL 890   Triglycerides     10.0 - 149.0 mg/dL 12.9   HDL Cholesterol     >39.00 mg/dL 57.29   VLDL     0.0 - 40.0 mg/dL 82.5   LDL (calc)     10 - 99 mg/dL 49   Total CHOL/HDL Ratio 3   NonHDL 66.54    Diabetes Mellitus II: DM II dx'ed 04/05/23 with HgA1C 6.7 Her diabetes is  managed with semaglutide  0.5 mg Ivor weekly, and her blood sugar levels have been stable, with a recent reading of 110 mg/dL. She experienced a hypoglycemic episode with a blood sugar level dropping to 50 mg/dL, which she managed by consuming candy.  She has not had an eye exam since 09/2022. - Negative for symptoms of hypoglycemia, polyuria, polydipsia, foot ulcers/trauma. Feet and fingertip numbness, intermittently. 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  Lab Results  Component Value Date   HGBA1C 5.5 11/09/2023   Lab Results  Component Value Date   MICROALBUR 1.8 07/07/2023   Review of Systems  Constitutional:  Negative for activity change, appetite change, chills and fever.  HENT:  Negative for sore throat.   Respiratory:  Negative for cough, shortness of breath and wheezing.   Cardiovascular:  Negative for chest pain and leg swelling.  Gastrointestinal:  Negative for abdominal pain, nausea and vomiting.  Genitourinary:  Negative for decreased urine volume, dysuria and hematuria.  Musculoskeletal:  Negative for gait problem.  Skin:  Negative for rash.  Neurological:  Negative for syncope, facial asymmetry and weakness.  See other pertinent positives and negatives in HPI.  Medications Ordered Prior to Encounter[1]  Past Medical History:  Diagnosis Date   Allergy  2012   NUMEROUS ALLERGIES AND ANAPHYLACTIC REACTIONS SINCE 2012   Anaphylactic reaction    several ED visits for same since 01/2012   Anemia    IDA    Asthma    Blood transfusion without reported diagnosis 1988   AFTER  CHILDBIRTH   GERD    Glaucoma    Hives of unknown origin    chronic and recurrent urticaria since 01/2012   HYPERLIPIDEMIA, WITH LOW HDL    MIGRAINE HEADACHE    PERIPHERAL NEUROPATHY    POSITIVE PPD 1987   s/p 14mo tx   Seizures (HCC)    Sleep apnea 2018   CPAP   Allergies[2]  Social History   Socioeconomic History   Marital status: Married    Spouse name: Glendi Mohiuddin   Number of children: Not on file   Years of education: Not on file   Highest education level: Not on file  Occupational History   Not on file  Tobacco Use   Smoking status: Never    Passive exposure: Current   Smokeless tobacco: Never  Vaping Use   Vaping status: Never Used  Substance and Sexual Activity   Alcohol use: No   Drug use: No   Sexual activity: Not on file  Other Topics Concern   Not on file  Social History Narrative   Married, lives with spouse, son and nephew   Futures Trader, 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   Social Drivers of Health   Tobacco Use: Medium Risk (05/15/2024)   Patient History    Smoking Tobacco Use: Never    Smokeless Tobacco Use: Never    Passive Exposure: Current  Financial Resource Strain: Not on file  Food Insecurity: No Food Insecurity (11/09/2023)   Epic    Worried About Programme Researcher, Broadcasting/film/video in the Last Year: Never true    Ran Out of Food in the Last Year: Never true  Transportation Needs: No Transportation Needs (11/09/2023)   Epic    Lack of Transportation (Medical): No    Lack of Transportation (Non-Medical): No  Physical Activity: Not on file  Stress: Not on file  Social Connections: Not on file  Depression (PHQ2-9): Low Risk (06/19/2023)   Depression (PHQ2-9)    PHQ-2 Score: 0  Alcohol Screen: Not on file  Housing: Low Risk (11/09/2023)   Epic    Unable to Pay for Housing in the Last Year: No    Number of Times Moved in the Last Year: 0    Homeless in the Last Year: No  Utilities: Not At Risk (11/09/2023)   Epic    Threatened with loss of utilities: No  Health Literacy: Not on file   Vitals:   06/12/24 1046  BP: 124/84  Pulse: 78  Resp: 16  Temp: 98.4 F (36.9 C)  SpO2: 98%   Body mass index is 32.38 kg/m.  Physical Exam Vitals and nursing note reviewed.  Constitutional:      General: She is not in acute distress.    Appearance: She is well-developed.   HENT:     Head: Normocephalic and atraumatic.     Mouth/Throat:     Mouth: Mucous membranes are moist.     Pharynx: Oropharynx is clear.  Eyes:     Conjunctiva/sclera: Conjunctivae normal.  Cardiovascular:     Rate and Rhythm: Normal rate and regular rhythm.     Pulses:  Dorsalis pedis pulses are 2+ on the right side and 2+ on the left side.     Heart sounds: No murmur heard. Pulmonary:     Effort: Pulmonary effort is normal. No respiratory distress.     Breath sounds: Normal breath sounds.  Abdominal:     Palpations: Abdomen is soft. There is no hepatomegaly or mass.     Tenderness: There is no abdominal tenderness.  Musculoskeletal:     Comments: Right middle finger slight trigger appreciated with flexion and extension, nodular thickening of tendon palpated under MCP joint, tender. Decreased flexion. Right heel pain:Tenderness upon palpation of heel mainly at the medial insertion of plantar fascia into calcaneous. Also mild discomfort with palpation along planta fascia towards forefoot. No significant bunion  Dorsal flexion of first MTP does not elicits pain. No edema or erythema appreciated on area.  Skin:    General: Skin is warm.     Findings: No erythema or rash.  Neurological:     General: No focal deficit present.     Mental Status: She is alert and oriented to person, place, and time.     Cranial Nerves: No cranial nerve deficit.     Gait: Gait normal.  Psychiatric:        Mood and Affect: Mood and affect normal.   ASSESSMENT AND PLAN:  Talina was seen today for hand pain and foot pain.  Diagnoses and all orders for this visit: Orders Placed This Encounter  Procedures   MM 3D SCREENING MAMMOGRAM BILATERAL BREAST   Varicella-zoster vaccine IM   Microalbumin / creatinine urine ratio   Comprehensive metabolic panel with GFR   Lipid panel   Ambulatory referral to Sports Medicine   POC HgB A1c   Lab Results  Component Value Date   HGBA1C 5.4  06/12/2024   Lab Results  Component Value Date   CHOL 141 06/12/2024   HDL 42.00 06/12/2024   LDLCALC 83 06/12/2024   LDLDIRECT 121.0 07/23/2014   TRIG 76.0 06/12/2024   CHOLHDL 3 06/12/2024   Lab Results  Component Value Date   NA 141 06/12/2024   CL 106 06/12/2024   K 3.4 (L) 06/12/2024   CO2 30 06/12/2024   BUN 8 06/12/2024   CREATININE 0.87 06/12/2024   GFR 73.89 06/12/2024   CALCIUM  9.6 06/12/2024   PHOS 3.4 11/09/2023   ALBUMIN 4.3 06/12/2024   GLUCOSE 90 06/12/2024   Lab Results  Component Value Date   ALT 9 06/12/2024   AST 15 06/12/2024   ALKPHOS 66 06/12/2024   BILITOT 0.6 06/12/2024   Lab Results  Component Value Date   MICROALBUR 1.4 06/12/2024   MICROALBUR 1.8 07/07/2023   Type 2 diabetes mellitus with other specified complication, without long-term current use of insulin  (HCC) Assessment & Plan: Problem is well controlled, today A1C 5.4. Continue Ozempic  0.5 mg weekly.  Regular exercise and healthy diet with avoidance of added sugar food intake encouraged. Annual eye exam, periodic dental and foot care to continue. F/U in 5-6 months. Orders: -     POCT glycosylated hemoglobin (Hb A1C) -     Microalbumin / creatinine urine ratio; Future -     Comprehensive metabolic panel with GFR; Future -     Ozempic  (0.25 or 0.5 MG/DOSE); Inject 0.5 mg into the skin once a week. INJECT 0.25 MG SUBCUTANEOUSLY WEEKLY X 4 WKS THEN 0.5 MG WEEKLY  Dispense: 3 mL; Refill: 1  Trigger middle finger of right hand Pain gradually getting  worse, limitation of ROM. We discussed differential Dx's and treatment options. Sport medicine referral placed.  -     Ambulatory referral to Sports Medicine  Hyperlipidemia, unspecified hyperlipidemia type Assessment & Plan: Last LDL 49 in 07/2023. Continue Rosuvastatin  5 mg daily and low fat diet. Further recommendations according to FLP results.  Orders: -     Comprehensive metabolic panel with GFR; Future -     Lipid panel;  Future  Plantar fasciitis Educated about Dx,prognosis,and treatment options. Conformable shoes, shoe inserts,stretching exercises recommenced. Night time ankle brace may also help.  -     Ambulatory referral to Sports Medicine  Encounter for screening for malignant neoplasm of breast, unspecified screening modality -     3D Screening Mammogram, Left and Right; Future  Return in about 6 months (around 12/10/2024) for chronic problems.   Glyn Zendejas G. Anicka Stuckert, MD  [facility]. [place] office.     [1]  Current Outpatient Medications on File Prior to Visit  Medication Sig Dispense Refill   ACCU-CHEK GUIDE TEST test strip USE TO TEST BLOOD SUGAR DAILY 100 strip 12   Accu-Chek Softclix Lancets lancets USE TO TEST BLOOD SUGAR DAILY 100 each 12   brimonidine  (ALPHAGAN ) 0.15 % ophthalmic solution Place 1 drop into both eyes 2 (two) times daily.     budesonide -formoterol  (SYMBICORT ) 80-4.5 MCG/ACT inhaler Inhale 2 puffs into the lungs 2 (two) times daily. 10.2 g 3   cetirizine  (ZYRTEC ) 10 MG tablet TAKE 1 TABLET BY MOUTH TWICE A DAY 60 tablet 2   diphenhydrAMINE  (BENADRYL ) 25 MG tablet Take 1 tablet (25 mg total) by mouth every 6 (six) hours as needed. 30 tablet 0   EPINEPHrine  0.3 mg/0.3 mL IJ SOAJ injection Inject 0.3 mg into the muscle as needed for anaphylaxis. 2 each 0   famotidine  (PEPCID ) 20 MG tablet TAKE 1 TABLET BY MOUTH TWICE A DAY 180 tablet 6   latanoprost  (XALATAN ) 0.005 % ophthalmic solution Place 1 drop into both eyes at bedtime.      omalizumab  (XOLAIR ) 300 MG/2  ML prefilled syringe Inject 300 mg into the skin every 28 (twenty-eight) days. 2 mL 11   pantoprazole  (PROTONIX ) 40 MG tablet Take 1 tablet (40 mg total) by mouth daily. 90 tablet 0   potassium chloride  SA (KLOR-CON  M) 20 MEQ tablet Take 1 tablet (20 mEq total) by mouth daily. 30 tablet 1   rosuvastatin  (CRESTOR ) 5 MG tablet Take 1 tablet (5 mg total) by mouth daily. 90 tablet 3   Semaglutide ,0.25 or  0.5MG /DOS, (OZEMPIC , 0.25 OR 0.5 MG/DOSE,) 2 MG/3ML SOPN INJECT 0.5 MG INTO THE SKIN ONCE A WEEK. INJECT 0.25 MG SUBCUTANEOUSLY WEEKLY X 4 WKS THEN 0.5 MG WEEKLY 3 mL 1   VENTOLIN  HFA 108 (90 Base) MCG/ACT inhaler TAKE 2 PUFFS BY MOUTH EVERY 6 HOURS AS NEEDED FOR WHEEZE OR SHORTNESS OF BREATH 18 each 2   hydrOXYzine  (ATARAX ) 25 MG tablet Take 1 tablet (25 mg total) by mouth 3 (three) times daily as needed for anxiety. (Patient not taking: Reported on 06/12/2024) 90 tablet 0   No current facility-administered medications on file prior to visit.  [2]  Allergies Allergen Reactions   Capsicum Annuum Extract & Derivative (Bell Pepper) [Capsicum] Anaphylaxis   Milk-Related Compounds Anaphylaxis   Other Anaphylaxis and Other (See Comments)    Chrystie rancher gummies   Peanuts [Peanut  Oil] Anaphylaxis and Itching   Shellfish Allergy  Anaphylaxis   Soy Allergy  (Obsolete) Other (See Comments) and Cough  Patient can't remember exact reaction    Tomato Anaphylaxis   Corn Dextrin Hives and Other (See Comments)    Plain corn ok, popcorn causes hives   Gluten Meal Other (See Comments) and Cough    Patient can't remember exact reaction    Morphine Itching and Rash   Sunflower Oil Itching and Other (See Comments)    Patient can't remember exact reaction    Wheat Nausea Only and Swelling    Patient can't remember exact reaction    "

## 2024-06-13 ENCOUNTER — Ambulatory Visit: Payer: Self-pay | Admitting: Family Medicine

## 2024-06-13 MED ORDER — POTASSIUM CHLORIDE CRYS ER 20 MEQ PO TBCR: 20.0000 meq | EXTENDED_RELEASE_TABLET | Freq: Every day | ORAL | 1 refills | Status: AC

## 2024-06-13 NOTE — Assessment & Plan Note (Signed)
 Last LDL 49 in 07/2023. Continue Rosuvastatin  5 mg daily and low fat diet. Further recommendations according to FLP results.

## 2024-06-13 NOTE — Assessment & Plan Note (Addendum)
 Problem is well controlled, today A1C 5.4. Continue Ozempic  0.5 mg weekly.  Regular exercise and healthy diet with avoidance of added sugar food intake encouraged. Annual eye exam, periodic dental and foot care to continue. F/U in 5-6 months.

## 2024-06-19 ENCOUNTER — Telehealth: Payer: Self-pay

## 2024-06-19 ENCOUNTER — Other Ambulatory Visit (HOSPITAL_COMMUNITY): Payer: Self-pay

## 2024-06-19 NOTE — Telephone Encounter (Signed)
 Pharmacy Patient Advocate Encounter  Received notification from [employer] that Prior Authorization for Ozempic  2 has been APPROVED from 06/19/24 to 06/19/25. Ran test claim, Copay is $4.00. This test claim was processed through [facility]- copay amounts may vary at other pharmacies due to pharmacy/plan contracts, or as the patient moves through the different stages of their insurance plan.   PA #/Case ID/Reference #: # [id number]

## 2024-06-19 NOTE — Telephone Encounter (Signed)
 Pharmacy Patient Advocate Encounter   Received notification from [facility] that prior authorization for Ozempic  2 is required/requested.   Insurance verification completed.   The patient is insured through [facility].   Per test claim: PA required; PA submitted to above mentioned insurance via Latent Key/confirmation #/EOC [id number] Status is pending

## 2024-06-20 ENCOUNTER — Ambulatory Visit
Admission: RE | Admit: 2024-06-20 | Discharge: 2024-06-20 | Disposition: A | Discharge status: Home / Self Care | Source: Ambulatory Visit | Attending: Family Medicine | Admitting: Family Medicine

## 2024-06-20 DIAGNOSIS — Z1239 Encounter for other screening for malignant neoplasm of breast: Secondary | ICD-10-CM

## 2024-06-20 NOTE — Progress Notes (Unsigned)
 "               Megan Wilkerson D.CLEMENTEEN AMYE [name] [address] [place] [address] Phone: [phone]   Assessment and Plan:     1. Trigger middle finger of right hand (Primary) -Chronic with exacerbation, initial sports medicine visit - Most consistent with trigger finger of right third digit - Patient elected for trigger finger CSI.  Tolerated well per note below  Procedure: Flexor Tendon Sheath Injection (Trigger Finger) Side: Right 3rd digit  Indication: Trigger Finger  After explaining the procedure, viable alternatives, risks, and answering any questions, consent was given verbally.  The site was cleaned with alcohol prep.  A steroid injection was performed under ultrasound guidance using 0.17ml of 1% lidocaine  without epinephrine  and 20 mg of Kenalog 40mg /mL with sterile technique.  This was well tolerated and resulted in  relief.  Needle was removed and dressing placed and post injection instructions were given including  a discussion of likely return of pain today after the anesthetic wears off (with the possibility of worsened pain) until the steroid starts to work in 3-5 days.   Pt was advised to call or return to clinic if these symptoms worsen or fail to improve as anticipated.  If not at least 50% better in 6 weeks would consider repeat injection. Images permanently stored.   2. Plantar fasciitis of right foot 3. Pain of right heel -Chronic exacerbation, initial sports medicine visit - Suspect multifactorial causes of pain of right foot, ankle, heel.  I suspect primary diagnosis of plantar fasciitis that is led to altered gait mechanics, leading to irritation of calcaneal fat pad, Achilles tendon - Start meloxicam 15 mg daily x2 weeks.  If still having pain after 2 weeks, complete 3rd-week of NSAID. May use remaining NSAID as needed once daily for pain control.  Do not to use additional over-the-counter NSAIDs (ibuprofen, naproxen, Advil, Aleve, etc.) while  taking prescription NSAIDs.  May use Tylenol  [phone] mg 2 to 3 times a day for breakthrough pain. - Start HEP for plantar fasciitis and ankle   15 additional minutes spent for educating Therapeutic Home Exercise Program.  This included exercises focusing on stretching, strengthening, with focus on eccentric aspects.   Long term goals include an improvement in range of motion, strength, endurance as well as avoiding reinjury. Patient's frequency would include in 1-2 times a day, 3-5 times a week for a duration of 6-12 weeks. Proper technique shown and discussed handout in great detail with ATC.  All questions were discussed and answered.    Pertinent previous records reviewed include none   Follow Up: 4 to 6 weeks for reevaluation.  Could consider plantar fasciitis CSI.  Could discuss alternative footwear.  Could consider physical therapy.  Could consider orthopedic hand surgery referral if no improvement and trigger finger   Subjective:   I, Megan Wilkerson, am serving as a neurosurgeon for Doctor Morene Wilkerson  Chief Complaint: right hand and foot pain   HPI:   06/21/2024 Patient is a 59 year old female with right hand and foot pain. Patient states 3rd right digit is triggering. She has pain going up the arm. Hand is locking . She isnt able to do her ADLS.   Right foot pain intermittent for months. Pain starts in the heels and radiates to her arch. She isnt able to stand for long periods of time.   Tylenol  did not help with any of the pain. Rest helped the  most but she is not able to at the moment    Relevant Historical Information: DM type II  Additional pertinent review of systems negative.  Current Medications[1]   Objective:     Vitals:   06/21/24 1034  BP: 130/78  Pulse: 87  SpO2: 99%  Weight: 194 lb (88 kg)  Height: 5' 5 (1.651 m)      Body mass index is 32.28 kg/m.    Physical Exam:    Gen: Appears well, nad, nontoxic and pleasant Psych: Alert and oriented,  appropriate mood and affect Neuro: sensation intact, strength is 5/5 with df/pf/inv/ev, muscle tone wnl Skin: no susupicious lesions or rashes  Right foot/ankle:  No deformity, no swelling or effusion TTP posterior and plantar surface subcutaneous, distal Achilles tendon, medial and middle plantar fascial bands NTTP over fibular head, lat mal, medial mal,  , navicular, base of 5th, ATFL, CFL, deltoid,  or midfoot ROM DF 30, PF 45, inv/ev intact Negative ant drawer, talar tilt, rotation test, squeeze test. Neg thompson No pain with resisted inversion or eversion   Right hand: Palpable nodule at base of third digit with TTP.  Locking present.  No erythema, open lesion  Electronically signed by:  Megan Wilkerson D.CLEMENTEEN AM[name] 11:59 AM 06/21/24     [1]  Current Outpatient Medications:    meloxicam (MOBIC) 15 MG tablet, Take 1 tablet daily for 2 weeks.  If still in pain after 2 weeks, take 1 tablet daily for an additional 1 week., Disp: 30 tablet, Rfl: 0   ACCU-CHEK GUIDE TEST test strip, USE TO TEST BLOOD SUGAR DAILY, Disp: 100 strip, Rfl: 12   Accu-Chek Softclix Lancets lancets, USE TO TEST BLOOD SUGAR DAILY, Disp: 100 each, Rfl: 12   brimonidine  (ALPHAGAN ) 0.15 % ophthalmic solution, Place 1 drop into both eyes 2 (two) times daily., Disp: , Rfl:    budesonide -formoterol  (SYMBICORT ) 80-4.5 MCG/ACT inhaler, Inhale 2 puffs into the lungs 2 (two) times daily., Disp: 10.2 g, Rfl: 3   cetirizine  (ZYRTEC ) 10 MG tablet, TAKE 1 TABLET BY MOUTH TWICE A DAY, Disp: 60 tablet, Rfl: 2   EPINEPHrine  0.3 mg/0.3 mL IJ SOAJ injection, Inject 0.3 mg into the muscle as needed for anaphylaxis., Disp: 2 each, Rfl: 0   famotidine  (PEPCID ) 20 MG tablet, TAKE 1 TABLET BY MOUTH TWICE A DAY, Disp: 180 tablet, Rfl: 6   hydrOXYzine  (ATARAX ) 25 MG tablet, Take 1 tablet (25 mg total) by mouth 3 (three) times daily as needed for anxiety. (Patient not taking: Reported on 06/12/2024), Disp: 90 tablet,  Rfl: 0   latanoprost  (XALATAN ) 0.005 % ophthalmic solution, Place 1 drop into both eyes at bedtime. , Disp: , Rfl:    omalizumab  (XOLAIR ) 300 MG/2  ML prefilled syringe, Inject 300 mg into the skin every 28 (twenty-eight) days., Disp: 2 mL, Rfl: 11   pantoprazole  (PROTONIX ) 40 MG tablet, Take 1 tablet (40 mg total) by mouth daily., Disp: 90 tablet, Rfl: 0   potassium chloride  SA (KLOR-CON  M) 20 MEQ tablet, Take 1 tablet (20 mEq total) by mouth daily., Disp: 90 tablet, Rfl: 1   rosuvastatin  (CRESTOR ) 5 MG tablet, Take 1 tablet (5 mg total) by mouth daily., Disp: 90 tablet, Rfl: 3   Semaglutide ,0.25 or 0.5MG /DOS, (OZEMPIC , 0.25 OR 0.5 MG/DOSE,) 2 MG/3ML SOPN, Inject 0.5 mg into the skin once a week. INJECT 0.25 MG SUBCUTANEOUSLY WEEKLY X 4 WKS THEN 0.5 MG WEEKLY, Disp: 3 mL, Rfl: 1   VENTOLIN  HFA 108 (  90 Base) MCG/ACT inhaler, TAKE 2 PUFFS BY MOUTH EVERY 6 HOURS AS NEEDED FOR WHEEZE OR SHORTNESS OF BREATH, Disp: 18 each, Rfl: 2  "

## 2024-06-21 ENCOUNTER — Ambulatory Visit: Admitting: Sports Medicine

## 2024-06-21 VITALS — BP 130/78 | HR 87 | Ht 65.0 in | Wt 194.0 lb

## 2024-06-21 DIAGNOSIS — M65331 Trigger finger, right middle finger: Secondary | ICD-10-CM | POA: Diagnosis not present

## 2024-06-21 DIAGNOSIS — M722 Plantar fascial fibromatosis: Secondary | ICD-10-CM

## 2024-06-21 DIAGNOSIS — M79671 Pain in right foot: Secondary | ICD-10-CM

## 2024-06-21 MED ORDER — MELOXICAM 15 MG PO TABS: ORAL_TABLET | ORAL | 0 refills | Status: AC

## 2024-06-21 NOTE — Patient Instructions (Signed)
 Foot HEP   - Start meloxicam  15 mg daily x2 weeks.  If still having pain after 2 weeks, complete 3rd-week of NSAID. May use remaining NSAID as needed once daily for pain control.  Do not to use additional over-the-counter NSAIDs (ibuprofen, naproxen, Advil, Aleve, etc.) while taking prescription NSAIDs.  May use Tylenol  [phone] mg 2 to 3 times a day for breakthrough pain.  4-6 week follow up

## 2024-06-26 ENCOUNTER — Other Ambulatory Visit: Payer: Self-pay

## 2024-06-28 ENCOUNTER — Other Ambulatory Visit: Payer: Self-pay

## 2024-07-01 ENCOUNTER — Other Ambulatory Visit (HOSPITAL_COMMUNITY): Payer: Self-pay

## 2024-07-19 ENCOUNTER — Ambulatory Visit: Admitting: Sports Medicine

## 2024-08-14 ENCOUNTER — Ambulatory Visit: Admitting: Allergy

## 2024-12-10 ENCOUNTER — Ambulatory Visit: Admitting: Family Medicine
